# Patient Record
Sex: Female | Born: 1935 | Race: White | Hispanic: No | Smoking: Never smoker
Health system: Southern US, Community
[De-identification: ages and names within clinical notes are randomized; demographics above are authoritative.]

## PROBLEM LIST (undated history)

## (undated) DIAGNOSIS — G8929 Other chronic pain: Secondary | ICD-10-CM

## (undated) DIAGNOSIS — E785 Hyperlipidemia, unspecified: Secondary | ICD-10-CM

## (undated) DIAGNOSIS — F419 Anxiety disorder, unspecified: Secondary | ICD-10-CM

## (undated) DIAGNOSIS — M1990 Unspecified osteoarthritis, unspecified site: Secondary | ICD-10-CM

## (undated) DIAGNOSIS — K754 Autoimmune hepatitis: Secondary | ICD-10-CM

## (undated) DIAGNOSIS — F32A Depression, unspecified: Secondary | ICD-10-CM

## (undated) DIAGNOSIS — I5189 Other ill-defined heart diseases: Secondary | ICD-10-CM

## (undated) DIAGNOSIS — M545 Low back pain, unspecified: Secondary | ICD-10-CM

## (undated) DIAGNOSIS — R634 Abnormal weight loss: Secondary | ICD-10-CM

## (undated) DIAGNOSIS — M316 Other giant cell arteritis: Secondary | ICD-10-CM

## (undated) DIAGNOSIS — K5732 Diverticulitis of large intestine without perforation or abscess without bleeding: Secondary | ICD-10-CM

## (undated) DIAGNOSIS — I4819 Other persistent atrial fibrillation: Secondary | ICD-10-CM

## (undated) DIAGNOSIS — E039 Hypothyroidism, unspecified: Secondary | ICD-10-CM

## (undated) DIAGNOSIS — R519 Headache, unspecified: Secondary | ICD-10-CM

## (undated) DIAGNOSIS — E559 Vitamin D deficiency, unspecified: Secondary | ICD-10-CM

## (undated) DIAGNOSIS — K219 Gastro-esophageal reflux disease without esophagitis: Secondary | ICD-10-CM

## (undated) DIAGNOSIS — I10 Essential (primary) hypertension: Secondary | ICD-10-CM

## (undated) DIAGNOSIS — R51 Headache: Secondary | ICD-10-CM

## (undated) DIAGNOSIS — F329 Major depressive disorder, single episode, unspecified: Secondary | ICD-10-CM

## (undated) DIAGNOSIS — C50919 Malignant neoplasm of unspecified site of unspecified female breast: Secondary | ICD-10-CM

## (undated) DIAGNOSIS — I639 Cerebral infarction, unspecified: Secondary | ICD-10-CM

## (undated) DIAGNOSIS — M797 Fibromyalgia: Secondary | ICD-10-CM

## (undated) HISTORY — DX: Depression, unspecified: F32.A

## (undated) HISTORY — PX: CHOLECYSTECTOMY: SHX55

## (undated) HISTORY — DX: Essential (primary) hypertension: I10

## (undated) HISTORY — DX: Major depressive disorder, single episode, unspecified: F32.9

## (undated) HISTORY — DX: Gastro-esophageal reflux disease without esophagitis: K21.9

## (undated) HISTORY — PX: DILATION AND CURETTAGE OF UTERUS: SHX78

## (undated) HISTORY — PX: APPENDECTOMY: SHX54

## (undated) HISTORY — DX: Unspecified osteoarthritis, unspecified site: M19.90

## (undated) HISTORY — DX: Hypothyroidism, unspecified: E03.9

## (undated) HISTORY — DX: Vitamin D deficiency, unspecified: E55.9

## (undated) HISTORY — DX: Abnormal weight loss: R63.4

## (undated) HISTORY — PX: BREAST LUMPECTOMY: SHX2

## (undated) HISTORY — DX: Diverticulitis of large intestine without perforation or abscess without bleeding: K57.32

## (undated) HISTORY — DX: Other giant cell arteritis: M31.6

## (undated) HISTORY — DX: Hyperlipidemia, unspecified: E78.5

## (undated) HISTORY — DX: Cerebral infarction, unspecified: I63.9

## (undated) HISTORY — PX: TONSILLECTOMY: SUR1361

## (undated) HISTORY — PX: BREAST BIOPSY: SHX20

## (undated) HISTORY — DX: Autoimmune hepatitis: K75.4

---

## 1986-09-15 HISTORY — PX: ABDOMINAL HYSTERECTOMY: SHX81

## 1998-05-04 ENCOUNTER — Other Ambulatory Visit: Admission: RE | Admit: 1998-05-04 | Discharge: 1998-05-04 | Payer: Self-pay | Admitting: *Deleted

## 2000-08-20 ENCOUNTER — Other Ambulatory Visit: Admission: RE | Admit: 2000-08-20 | Discharge: 2000-08-20 | Payer: Self-pay | Admitting: Internal Medicine

## 2000-08-20 ENCOUNTER — Encounter (INDEPENDENT_AMBULATORY_CARE_PROVIDER_SITE_OTHER): Payer: Self-pay | Admitting: Specialist

## 2000-11-03 ENCOUNTER — Ambulatory Visit (HOSPITAL_COMMUNITY): Admission: RE | Admit: 2000-11-03 | Discharge: 2000-11-03 | Payer: Self-pay | Admitting: Cardiology

## 2001-01-12 ENCOUNTER — Other Ambulatory Visit: Admission: RE | Admit: 2001-01-12 | Discharge: 2001-01-12 | Payer: Self-pay | Admitting: Obstetrics and Gynecology

## 2001-09-15 DIAGNOSIS — M316 Other giant cell arteritis: Secondary | ICD-10-CM

## 2001-09-15 HISTORY — DX: Other giant cell arteritis: M31.6

## 2001-12-23 ENCOUNTER — Encounter: Admission: RE | Admit: 2001-12-23 | Discharge: 2001-12-23 | Payer: Self-pay | Admitting: Internal Medicine

## 2001-12-23 ENCOUNTER — Encounter: Payer: Self-pay | Admitting: Internal Medicine

## 2002-12-09 ENCOUNTER — Encounter: Payer: Self-pay | Admitting: *Deleted

## 2002-12-09 ENCOUNTER — Ambulatory Visit (HOSPITAL_COMMUNITY): Admission: RE | Admit: 2002-12-09 | Discharge: 2002-12-09 | Payer: Self-pay | Admitting: *Deleted

## 2002-12-20 ENCOUNTER — Encounter: Admission: RE | Admit: 2002-12-20 | Discharge: 2002-12-20 | Payer: Self-pay | Admitting: Rheumatology

## 2002-12-20 ENCOUNTER — Encounter: Payer: Self-pay | Admitting: Rheumatology

## 2002-12-26 ENCOUNTER — Ambulatory Visit (HOSPITAL_BASED_OUTPATIENT_CLINIC_OR_DEPARTMENT_OTHER): Admission: RE | Admit: 2002-12-26 | Discharge: 2002-12-26 | Payer: Self-pay | Admitting: Surgery

## 2002-12-26 ENCOUNTER — Encounter (INDEPENDENT_AMBULATORY_CARE_PROVIDER_SITE_OTHER): Payer: Self-pay | Admitting: Specialist

## 2004-07-30 ENCOUNTER — Ambulatory Visit: Payer: Self-pay | Admitting: Internal Medicine

## 2004-08-02 ENCOUNTER — Ambulatory Visit: Payer: Self-pay | Admitting: Endocrinology

## 2004-08-02 ENCOUNTER — Ambulatory Visit: Payer: Self-pay | Admitting: Internal Medicine

## 2004-10-23 ENCOUNTER — Ambulatory Visit: Payer: Self-pay | Admitting: Internal Medicine

## 2005-02-07 ENCOUNTER — Ambulatory Visit: Payer: Self-pay | Admitting: Internal Medicine

## 2005-03-05 ENCOUNTER — Ambulatory Visit: Payer: Self-pay | Admitting: Internal Medicine

## 2005-04-16 ENCOUNTER — Ambulatory Visit: Payer: Self-pay | Admitting: Internal Medicine

## 2005-05-28 ENCOUNTER — Ambulatory Visit: Payer: Self-pay | Admitting: Internal Medicine

## 2005-07-16 ENCOUNTER — Ambulatory Visit: Payer: Self-pay | Admitting: Internal Medicine

## 2005-07-22 ENCOUNTER — Ambulatory Visit: Payer: Self-pay | Admitting: Internal Medicine

## 2005-07-29 ENCOUNTER — Ambulatory Visit: Payer: Self-pay | Admitting: Internal Medicine

## 2005-07-30 ENCOUNTER — Ambulatory Visit: Payer: Self-pay | Admitting: Internal Medicine

## 2005-08-29 ENCOUNTER — Ambulatory Visit: Payer: Self-pay | Admitting: Internal Medicine

## 2005-09-03 ENCOUNTER — Ambulatory Visit: Payer: Self-pay | Admitting: Internal Medicine

## 2005-09-30 ENCOUNTER — Ambulatory Visit: Payer: Self-pay | Admitting: Internal Medicine

## 2005-10-01 ENCOUNTER — Ambulatory Visit: Payer: Self-pay | Admitting: Cardiology

## 2005-10-03 ENCOUNTER — Ambulatory Visit: Payer: Self-pay | Admitting: Internal Medicine

## 2005-11-24 ENCOUNTER — Encounter (INDEPENDENT_AMBULATORY_CARE_PROVIDER_SITE_OTHER): Payer: Self-pay | Admitting: *Deleted

## 2005-11-24 ENCOUNTER — Encounter (INDEPENDENT_AMBULATORY_CARE_PROVIDER_SITE_OTHER): Payer: Self-pay | Admitting: Diagnostic Radiology

## 2005-11-24 ENCOUNTER — Encounter: Admission: RE | Admit: 2005-11-24 | Discharge: 2005-11-24 | Payer: Self-pay | Admitting: General Surgery

## 2005-11-28 ENCOUNTER — Encounter: Admission: RE | Admit: 2005-11-28 | Discharge: 2005-11-28 | Payer: Self-pay | Admitting: General Surgery

## 2005-12-02 ENCOUNTER — Ambulatory Visit: Payer: Self-pay | Admitting: Internal Medicine

## 2005-12-03 ENCOUNTER — Encounter (INDEPENDENT_AMBULATORY_CARE_PROVIDER_SITE_OTHER): Payer: Self-pay | Admitting: Specialist

## 2005-12-03 ENCOUNTER — Ambulatory Visit (HOSPITAL_BASED_OUTPATIENT_CLINIC_OR_DEPARTMENT_OTHER): Admission: RE | Admit: 2005-12-03 | Discharge: 2005-12-03 | Payer: Self-pay | Admitting: General Surgery

## 2005-12-03 ENCOUNTER — Encounter: Admission: RE | Admit: 2005-12-03 | Discharge: 2005-12-03 | Payer: Self-pay | Admitting: General Surgery

## 2005-12-04 ENCOUNTER — Ambulatory Visit: Payer: Self-pay | Admitting: Oncology

## 2005-12-17 LAB — COMPREHENSIVE METABOLIC PANEL
Albumin: 4.5 g/dL (ref 3.5–5.2)
BUN: 11 mg/dL (ref 6–23)
CO2: 28 mEq/L (ref 19–32)
Calcium: 10 mg/dL (ref 8.4–10.5)
Chloride: 97 mEq/L (ref 96–112)
Creatinine, Ser: 0.7 mg/dL (ref 0.4–1.2)
Glucose, Bld: 89 mg/dL (ref 70–99)

## 2005-12-17 LAB — CBC WITH DIFFERENTIAL/PLATELET
BASO%: 0.4 % (ref 0.0–2.0)
Basophils Absolute: 0 10*3/uL (ref 0.0–0.1)
EOS%: 3 % (ref 0.0–7.0)
MCH: 30.5 pg (ref 26.0–34.0)
MCHC: 34.4 g/dL (ref 32.0–36.0)
MCV: 88.9 fL (ref 81.0–101.0)
MONO%: 8.7 % (ref 0.0–13.0)
RDW: 13.1 % (ref 11.3–14.5)
lymph#: 1.1 10*3/uL (ref 0.9–3.3)

## 2005-12-17 LAB — LACTATE DEHYDROGENASE: LDH: 135 U/L (ref 94–250)

## 2005-12-18 ENCOUNTER — Ambulatory Visit: Admission: RE | Admit: 2005-12-18 | Discharge: 2006-03-11 | Payer: Self-pay | Admitting: Radiation Oncology

## 2006-01-29 ENCOUNTER — Ambulatory Visit: Payer: Self-pay | Admitting: Internal Medicine

## 2006-02-03 ENCOUNTER — Ambulatory Visit: Payer: Self-pay | Admitting: Internal Medicine

## 2006-02-10 ENCOUNTER — Ambulatory Visit: Payer: Self-pay | Admitting: Oncology

## 2006-03-11 ENCOUNTER — Ambulatory Visit: Payer: Self-pay | Admitting: Internal Medicine

## 2006-04-07 ENCOUNTER — Ambulatory Visit: Payer: Self-pay | Admitting: Oncology

## 2006-06-09 ENCOUNTER — Ambulatory Visit: Payer: Self-pay | Admitting: Internal Medicine

## 2006-06-15 ENCOUNTER — Ambulatory Visit: Payer: Self-pay | Admitting: Cardiology

## 2006-06-23 ENCOUNTER — Ambulatory Visit: Payer: Self-pay | Admitting: Internal Medicine

## 2006-08-04 ENCOUNTER — Ambulatory Visit: Payer: Self-pay | Admitting: Internal Medicine

## 2006-08-05 ENCOUNTER — Ambulatory Visit: Payer: Self-pay | Admitting: Oncology

## 2006-08-10 LAB — CBC WITH DIFFERENTIAL/PLATELET
BASO%: 0.3 % (ref 0.0–2.0)
Basophils Absolute: 0 10*3/uL (ref 0.0–0.1)
EOS%: 3.9 % (ref 0.0–7.0)
LYMPH%: 8.9 % — ABNORMAL LOW (ref 14.0–48.0)
MCH: 30.6 pg (ref 26.0–34.0)
MCHC: 34.2 g/dL (ref 32.0–36.0)
MCV: 89.4 fL (ref 81.0–101.0)
MONO%: 11.7 % (ref 0.0–13.0)
NEUT#: 3.5 10*3/uL (ref 1.5–6.5)
NEUT%: 75.2 % (ref 39.6–76.8)
WBC: 4.6 10*3/uL (ref 3.9–10.0)

## 2006-08-13 ENCOUNTER — Encounter: Admission: RE | Admit: 2006-08-13 | Discharge: 2006-08-13 | Payer: Self-pay | Admitting: Internal Medicine

## 2006-09-18 ENCOUNTER — Ambulatory Visit: Payer: Self-pay | Admitting: Internal Medicine

## 2006-09-18 LAB — CONVERTED CEMR LAB
Basophils Relative: 0.9 % (ref 0.0–1.0)
Bilirubin, Direct: 0.2 mg/dL (ref 0.0–0.3)
Creatinine, Ser: 0.8 mg/dL (ref 0.4–1.2)
Eosinophil percent: 2.2 % (ref 0.0–5.0)
Glucose, Bld: 131 mg/dL — ABNORMAL HIGH (ref 70–99)
HCT: 37.3 % (ref 36.0–46.0)
Lymphocytes Relative: 9.1 % — ABNORMAL LOW (ref 12.0–46.0)
MCV: 91.5 fL (ref 78.0–100.0)
Monocytes Absolute: 0.5 10*3/uL (ref 0.2–0.7)
Neutro Abs: 4.2 10*3/uL (ref 1.4–7.7)
Platelets: 134 10*3/uL — ABNORMAL LOW (ref 150–400)
RBC: 4.07 M/uL (ref 3.87–5.11)
Rheumatoid Fact: 24.3 intl units/mL — ABNORMAL HIGH (ref 0.0–20.0)
Sed Rate: 41 mm/hr — ABNORMAL HIGH (ref 0–25)
Sodium: 137 meq/L (ref 135–145)
Total Bilirubin: 1.1 mg/dL (ref 0.3–1.2)
WBC: 5.3 10*3/uL (ref 4.5–10.5)

## 2006-11-03 ENCOUNTER — Ambulatory Visit: Payer: Self-pay | Admitting: Internal Medicine

## 2006-11-17 ENCOUNTER — Ambulatory Visit: Payer: Self-pay | Admitting: Internal Medicine

## 2007-02-11 ENCOUNTER — Ambulatory Visit: Payer: Self-pay | Admitting: Oncology

## 2007-02-15 LAB — CBC WITH DIFFERENTIAL/PLATELET
BASO%: 0.2 % (ref 0.0–2.0)
Eosinophils Absolute: 0.2 10*3/uL (ref 0.0–0.5)
MCHC: 36 g/dL (ref 32.0–36.0)
MONO#: 0.6 10*3/uL (ref 0.1–0.9)
MONO%: 10.8 % (ref 0.0–13.0)
NEUT#: 3.7 10*3/uL (ref 1.5–6.5)
RBC: 3.92 10*6/uL (ref 3.70–5.32)
RDW: 13.3 % (ref 11.3–14.5)
WBC: 5.2 10*3/uL (ref 3.9–10.0)

## 2007-02-15 LAB — COMPREHENSIVE METABOLIC PANEL
ALT: 21 U/L (ref 0–35)
Albumin: 4.2 g/dL (ref 3.5–5.2)
Alkaline Phosphatase: 60 U/L (ref 39–117)
Glucose, Bld: 70 mg/dL (ref 70–99)
Potassium: 4.3 mEq/L (ref 3.5–5.3)
Sodium: 138 mEq/L (ref 135–145)
Total Bilirubin: 0.7 mg/dL (ref 0.3–1.2)
Total Protein: 7.6 g/dL (ref 6.0–8.3)

## 2007-02-17 ENCOUNTER — Ambulatory Visit: Payer: Self-pay | Admitting: Internal Medicine

## 2007-04-07 ENCOUNTER — Ambulatory Visit: Payer: Self-pay | Admitting: Internal Medicine

## 2007-05-25 ENCOUNTER — Ambulatory Visit: Payer: Self-pay | Admitting: Internal Medicine

## 2007-05-25 LAB — CONVERTED CEMR LAB
ALT: 46 units/L — ABNORMAL HIGH (ref 0–35)
AST: 85 units/L — ABNORMAL HIGH (ref 0–37)
Albumin: 3.6 g/dL (ref 3.5–5.2)
Alkaline Phosphatase: 101 units/L (ref 39–117)
Bilirubin, Direct: 0.2 mg/dL (ref 0.0–0.3)
Eosinophils Relative: 2.7 % (ref 0.0–5.0)
HCT: 35 % — ABNORMAL LOW (ref 36.0–46.0)
Lymphocytes Relative: 8.9 % — ABNORMAL LOW (ref 12.0–46.0)
MCHC: 35.2 g/dL (ref 30.0–36.0)
MCV: 91.9 fL (ref 78.0–100.0)
Monocytes Absolute: 0.5 10*3/uL (ref 0.2–0.7)
Neutrophils Relative %: 77.3 % — ABNORMAL HIGH (ref 43.0–77.0)
Platelets: 140 10*3/uL — ABNORMAL LOW (ref 150–400)
RBC: 3.77 M/uL — ABNORMAL LOW (ref 3.87–5.11)
Sed Rate: 56 mm/hr — ABNORMAL HIGH (ref 0–25)
Total CHOL/HDL Ratio: 3.5
Triglycerides: 111 mg/dL (ref 0–149)
Vit D, 1,25-Dihydroxy: 18 — ABNORMAL LOW (ref 20–57)
WBC: 4.7 10*3/uL (ref 4.5–10.5)

## 2007-06-09 DIAGNOSIS — M353 Polymyalgia rheumatica: Secondary | ICD-10-CM | POA: Insufficient documentation

## 2007-06-09 DIAGNOSIS — E559 Vitamin D deficiency, unspecified: Secondary | ICD-10-CM | POA: Insufficient documentation

## 2007-07-19 ENCOUNTER — Ambulatory Visit: Payer: Self-pay | Admitting: Internal Medicine

## 2007-07-19 DIAGNOSIS — K5730 Diverticulosis of large intestine without perforation or abscess without bleeding: Secondary | ICD-10-CM | POA: Insufficient documentation

## 2007-07-19 DIAGNOSIS — I10 Essential (primary) hypertension: Secondary | ICD-10-CM | POA: Insufficient documentation

## 2007-07-19 DIAGNOSIS — E039 Hypothyroidism, unspecified: Secondary | ICD-10-CM | POA: Insufficient documentation

## 2007-07-19 DIAGNOSIS — M1990 Unspecified osteoarthritis, unspecified site: Secondary | ICD-10-CM | POA: Insufficient documentation

## 2007-07-19 DIAGNOSIS — Z853 Personal history of malignant neoplasm of breast: Secondary | ICD-10-CM

## 2007-07-19 DIAGNOSIS — J309 Allergic rhinitis, unspecified: Secondary | ICD-10-CM | POA: Insufficient documentation

## 2007-08-09 ENCOUNTER — Ambulatory Visit: Payer: Self-pay | Admitting: Oncology

## 2007-08-16 ENCOUNTER — Encounter: Payer: Self-pay | Admitting: Internal Medicine

## 2007-08-16 LAB — CBC WITH DIFFERENTIAL/PLATELET
Basophils Absolute: 0 10*3/uL (ref 0.0–0.1)
EOS%: 1.8 % (ref 0.0–7.0)
Eosinophils Absolute: 0.1 10*3/uL (ref 0.0–0.5)
LYMPH%: 8.4 % — ABNORMAL LOW (ref 14.0–48.0)
MCH: 31.3 pg (ref 26.0–34.0)
MCV: 88.7 fL (ref 81.0–101.0)
MONO%: 10.5 % (ref 0.0–13.0)
NEUT#: 5.8 10*3/uL (ref 1.5–6.5)
Platelets: 134 10*3/uL — ABNORMAL LOW (ref 145–400)
RBC: 4.26 10*6/uL (ref 3.70–5.32)
RDW: 13.8 % (ref 11.3–14.5)

## 2007-08-16 LAB — COMPREHENSIVE METABOLIC PANEL
AST: 23 U/L (ref 0–37)
Alkaline Phosphatase: 60 U/L (ref 39–117)
BUN: 19 mg/dL (ref 6–23)
Glucose, Bld: 87 mg/dL (ref 70–99)
Potassium: 4.5 mEq/L (ref 3.5–5.3)
Total Bilirubin: 0.7 mg/dL (ref 0.3–1.2)

## 2007-08-22 ENCOUNTER — Encounter: Payer: Self-pay | Admitting: Internal Medicine

## 2007-10-06 ENCOUNTER — Ambulatory Visit: Payer: Self-pay | Admitting: Internal Medicine

## 2007-10-06 LAB — CONVERTED CEMR LAB
Albumin: 3.6 g/dL (ref 3.5–5.2)
Alkaline Phosphatase: 48 units/L (ref 39–117)
CO2: 29 meq/L (ref 19–32)
Direct LDL: 125.9 mg/dL
GFR calc Af Amer: 79 mL/min
Glucose, Bld: 89 mg/dL (ref 70–99)
HDL: 66.7 mg/dL (ref >39.0)
Potassium: 4.2 meq/L (ref 3.5–5.1)
Sed Rate: 36 mm/hr — ABNORMAL HIGH (ref 0–25)
Sodium: 138 meq/L (ref 135–145)
TSH: 2.62 microintl units/mL (ref 0.35–5.50)
Total Bilirubin: 1.1 mg/dL (ref 0.3–1.2)
Total Protein: 7.5 g/dL (ref 6.0–8.3)
VLDL: 40 mg/dL (ref 0–40)

## 2007-10-15 ENCOUNTER — Ambulatory Visit: Payer: Self-pay | Admitting: Internal Medicine

## 2007-10-15 DIAGNOSIS — K219 Gastro-esophageal reflux disease without esophagitis: Secondary | ICD-10-CM | POA: Insufficient documentation

## 2007-12-10 ENCOUNTER — Encounter: Payer: Self-pay | Admitting: Internal Medicine

## 2008-02-04 ENCOUNTER — Ambulatory Visit: Payer: Self-pay | Admitting: Internal Medicine

## 2008-02-10 ENCOUNTER — Ambulatory Visit: Payer: Self-pay | Admitting: Internal Medicine

## 2008-02-10 ENCOUNTER — Ambulatory Visit: Payer: Self-pay | Admitting: Oncology

## 2008-02-22 ENCOUNTER — Encounter: Payer: Self-pay | Admitting: Internal Medicine

## 2008-02-22 LAB — CBC WITH DIFFERENTIAL/PLATELET
Basophils Absolute: 0 10*3/uL (ref 0.0–0.1)
Eosinophils Absolute: 0.1 10*3/uL (ref 0.0–0.5)
HCT: 36 % (ref 34.8–46.6)
HGB: 12.7 g/dL (ref 11.6–15.9)
MCV: 88.3 fL (ref 81.0–101.0)
MONO%: 9.7 % (ref 0.0–13.0)
NEUT#: 4 10*3/uL (ref 1.5–6.5)
NEUT%: 74.6 % (ref 39.6–76.8)
RDW: 12.7 % (ref 11.3–14.5)

## 2008-02-22 LAB — COMPREHENSIVE METABOLIC PANEL
Albumin: 4.1 g/dL (ref 3.5–5.2)
BUN: 10 mg/dL (ref 6–23)
Calcium: 9.7 mg/dL (ref 8.4–10.5)
Chloride: 100 mEq/L (ref 96–112)
Glucose, Bld: 88 mg/dL (ref 70–99)
Potassium: 4.8 mEq/L (ref 3.5–5.3)

## 2008-03-24 ENCOUNTER — Encounter: Payer: Self-pay | Admitting: Internal Medicine

## 2008-04-06 ENCOUNTER — Ambulatory Visit: Payer: Self-pay | Admitting: Internal Medicine

## 2008-04-06 DIAGNOSIS — R071 Chest pain on breathing: Secondary | ICD-10-CM

## 2008-04-14 ENCOUNTER — Telehealth: Payer: Self-pay | Admitting: Internal Medicine

## 2008-07-03 ENCOUNTER — Encounter: Admission: RE | Admit: 2008-07-03 | Discharge: 2008-07-03 | Payer: Self-pay | Admitting: General Surgery

## 2008-07-18 ENCOUNTER — Ambulatory Visit: Payer: Self-pay | Admitting: Internal Medicine

## 2008-08-11 ENCOUNTER — Ambulatory Visit: Payer: Self-pay | Admitting: Oncology

## 2008-08-15 ENCOUNTER — Encounter: Payer: Self-pay | Admitting: Internal Medicine

## 2008-08-15 LAB — CBC WITH DIFFERENTIAL/PLATELET
Basophils Absolute: 0 10*3/uL (ref 0.0–0.1)
EOS%: 1.9 % (ref 0.0–7.0)
HCT: 37.3 % (ref 34.8–46.6)
HGB: 13 g/dL (ref 11.6–15.9)
LYMPH%: 8.6 % — ABNORMAL LOW (ref 14.0–48.0)
MCH: 31.3 pg (ref 26.0–34.0)
MCHC: 34.8 g/dL (ref 32.0–36.0)
MCV: 89.9 fL (ref 81.0–101.0)
MONO%: 10.2 % (ref 0.0–13.0)
NEUT%: 79.2 % — ABNORMAL HIGH (ref 39.6–76.8)

## 2008-08-15 LAB — COMPREHENSIVE METABOLIC PANEL
AST: 31 U/L (ref 0–37)
Alkaline Phosphatase: 53 U/L (ref 39–117)
BUN: 10 mg/dL (ref 6–23)
Calcium: 9.6 mg/dL (ref 8.4–10.5)
Creatinine, Ser: 0.7 mg/dL (ref 0.40–1.20)
Total Bilirubin: 0.9 mg/dL (ref 0.3–1.2)

## 2008-09-06 ENCOUNTER — Encounter: Payer: Self-pay | Admitting: Internal Medicine

## 2008-09-19 ENCOUNTER — Encounter: Payer: Self-pay | Admitting: Internal Medicine

## 2008-10-03 ENCOUNTER — Telehealth: Payer: Self-pay | Admitting: Internal Medicine

## 2008-11-17 ENCOUNTER — Ambulatory Visit: Payer: Self-pay | Admitting: Internal Medicine

## 2009-01-04 ENCOUNTER — Encounter: Payer: Self-pay | Admitting: Internal Medicine

## 2009-01-30 ENCOUNTER — Encounter: Admission: RE | Admit: 2009-01-30 | Discharge: 2009-01-30 | Payer: Self-pay | Admitting: General Surgery

## 2009-03-12 ENCOUNTER — Encounter: Payer: Self-pay | Admitting: Internal Medicine

## 2009-03-16 ENCOUNTER — Ambulatory Visit: Payer: Self-pay | Admitting: Internal Medicine

## 2009-03-20 ENCOUNTER — Encounter: Payer: Self-pay | Admitting: Internal Medicine

## 2009-03-20 ENCOUNTER — Telehealth: Payer: Self-pay | Admitting: Internal Medicine

## 2009-05-15 ENCOUNTER — Encounter: Payer: Self-pay | Admitting: Internal Medicine

## 2009-06-26 ENCOUNTER — Encounter: Payer: Self-pay | Admitting: Internal Medicine

## 2009-07-27 ENCOUNTER — Ambulatory Visit: Payer: Self-pay | Admitting: Oncology

## 2009-07-31 ENCOUNTER — Encounter: Payer: Self-pay | Admitting: Internal Medicine

## 2009-07-31 LAB — CBC WITH DIFFERENTIAL/PLATELET
BASO%: 0.1 % (ref 0.0–2.0)
Basophils Absolute: 0 10*3/uL (ref 0.0–0.1)
EOS%: 3.8 % (ref 0.0–7.0)
HGB: 12.9 g/dL (ref 11.6–15.9)
MCH: 31.4 pg (ref 25.1–34.0)
MCHC: 34.5 g/dL (ref 31.5–36.0)
MCV: 90.9 fL (ref 79.5–101.0)
MONO%: 9.2 % (ref 0.0–14.0)
NEUT%: 76.4 % (ref 38.4–76.8)
RDW: 13.2 % (ref 11.2–14.5)
lymph#: 0.5 10*3/uL — ABNORMAL LOW (ref 0.9–3.3)

## 2009-07-31 LAB — COMPREHENSIVE METABOLIC PANEL
ALT: 32 U/L (ref 0–35)
AST: 49 U/L — ABNORMAL HIGH (ref 0–37)
Alkaline Phosphatase: 66 U/L (ref 39–117)
BUN: 10 mg/dL (ref 6–23)
Creatinine, Ser: 0.79 mg/dL (ref 0.40–1.20)
Potassium: 4.5 mEq/L (ref 3.5–5.3)

## 2009-08-15 ENCOUNTER — Ambulatory Visit: Payer: Self-pay | Admitting: Internal Medicine

## 2009-08-16 LAB — CONVERTED CEMR LAB
ALT: 39 units/L — ABNORMAL HIGH (ref 0–35)
Bilirubin, Direct: 0.1 mg/dL (ref 0.0–0.3)
Chloride: 105 meq/L (ref 96–112)
GFR calc non Af Amer: 74.34 mL/min (ref >60)
Glucose, Bld: 84 mg/dL (ref 70–99)
Potassium: 4.3 meq/L (ref 3.5–5.1)
TSH: 0.56 microintl units/mL (ref 0.35–5.50)
Vit D, 25-Hydroxy: 14 ng/mL — ABNORMAL LOW (ref 30–89)

## 2009-08-17 ENCOUNTER — Ambulatory Visit: Payer: Self-pay | Admitting: Internal Medicine

## 2009-08-17 DIAGNOSIS — Z8639 Personal history of other endocrine, nutritional and metabolic disease: Secondary | ICD-10-CM

## 2009-08-17 DIAGNOSIS — Z862 Personal history of diseases of the blood and blood-forming organs and certain disorders involving the immune mechanism: Secondary | ICD-10-CM | POA: Insufficient documentation

## 2009-09-03 ENCOUNTER — Encounter: Payer: Self-pay | Admitting: Endocrinology

## 2009-09-03 LAB — CYP450

## 2009-10-23 ENCOUNTER — Ambulatory Visit: Payer: Self-pay | Admitting: Internal Medicine

## 2009-10-23 DIAGNOSIS — R42 Dizziness and giddiness: Secondary | ICD-10-CM | POA: Insufficient documentation

## 2009-11-27 ENCOUNTER — Ambulatory Visit: Payer: Self-pay | Admitting: Internal Medicine

## 2009-11-28 LAB — CONVERTED CEMR LAB
ALT: 28 units/L (ref 0–35)
Albumin: 3.9 g/dL (ref 3.5–5.2)
Alkaline Phosphatase: 63 units/L (ref 39–117)
BUN: 10 mg/dL (ref 6–23)
Bilirubin, Direct: 0.2 mg/dL (ref 0.0–0.3)
Creatinine, Ser: 0.6 mg/dL (ref 0.4–1.2)
Total Bilirubin: 0.8 mg/dL (ref 0.3–1.2)
Total Protein: 8.2 g/dL (ref 6.0–8.3)

## 2009-11-30 ENCOUNTER — Ambulatory Visit: Payer: Self-pay | Admitting: Internal Medicine

## 2009-12-17 ENCOUNTER — Ambulatory Visit: Payer: Self-pay | Admitting: Oncology

## 2009-12-19 ENCOUNTER — Encounter: Payer: Self-pay | Admitting: Internal Medicine

## 2009-12-19 LAB — CBC WITH DIFFERENTIAL/PLATELET
Basophils Absolute: 0 10*3/uL (ref 0.0–0.1)
Eosinophils Absolute: 0.3 10*3/uL (ref 0.0–0.5)
HCT: 37.3 % (ref 34.8–46.6)
MCH: 31.7 pg (ref 25.1–34.0)
MONO#: 0.6 10*3/uL (ref 0.1–0.9)
NEUT%: 73.1 % (ref 38.4–76.8)
Platelets: 152 10*3/uL (ref 145–400)
RDW: 13.7 % (ref 11.2–14.5)
WBC: 6.2 10*3/uL (ref 3.9–10.3)
lymph#: 0.8 10*3/uL — ABNORMAL LOW (ref 0.9–3.3)

## 2009-12-19 LAB — COMPREHENSIVE METABOLIC PANEL
ALT: 29 U/L (ref 0–35)
Alkaline Phosphatase: 83 U/L (ref 39–117)
CO2: 23 mEq/L (ref 19–32)
Potassium: 4.1 mEq/L (ref 3.5–5.3)
Sodium: 136 mEq/L (ref 135–145)

## 2009-12-19 LAB — VITAMIN D 25 HYDROXY (VIT D DEFICIENCY, FRACTURES): Vit D, 25-Hydroxy: 14 ng/mL — ABNORMAL LOW (ref 30–89)

## 2010-01-17 ENCOUNTER — Encounter: Payer: Self-pay | Admitting: Internal Medicine

## 2010-02-13 DIAGNOSIS — R634 Abnormal weight loss: Secondary | ICD-10-CM

## 2010-02-13 HISTORY — DX: Abnormal weight loss: R63.4

## 2010-03-01 ENCOUNTER — Ambulatory Visit: Payer: Self-pay | Admitting: Internal Medicine

## 2010-03-04 LAB — CONVERTED CEMR LAB
AST: 95 units/L — ABNORMAL HIGH (ref 0–37)
Albumin: 3.7 g/dL (ref 3.5–5.2)
Alkaline Phosphatase: 85 units/L (ref 39–117)
Bilirubin Urine: NEGATIVE
Chloride: 109 meq/L (ref 96–112)
Cholesterol: 191 mg/dL (ref 0–200)
Creatinine, Ser: 0.7 mg/dL (ref 0.4–1.2)
Glucose, Bld: 82 mg/dL (ref 70–99)
HDL: 53.1 mg/dL (ref >39.00)
Hemoglobin, Urine: NEGATIVE
LDL Cholesterol: 112 mg/dL — ABNORMAL HIGH (ref 0–99)
Leukocytes, UA: NEGATIVE
Nitrite: NEGATIVE
Sodium: 141 meq/L (ref 135–145)
Specific Gravity, Urine: 1.025 (ref 1.000–1.030)
TSH: 0.62 microintl units/mL (ref 0.35–5.50)
Triglycerides: 131 mg/dL (ref 0.0–149.0)
Urobilinogen, UA: 1 (ref 0.0–1.0)
Vit D, 25-Hydroxy: 22 ng/mL — ABNORMAL LOW (ref 30–89)
pH: 5.5 (ref 5.0–8.0)

## 2010-03-06 ENCOUNTER — Ambulatory Visit: Payer: Self-pay | Admitting: Internal Medicine

## 2010-03-06 DIAGNOSIS — R5383 Other fatigue: Secondary | ICD-10-CM

## 2010-03-06 DIAGNOSIS — R634 Abnormal weight loss: Secondary | ICD-10-CM

## 2010-03-13 ENCOUNTER — Encounter: Admission: RE | Admit: 2010-03-13 | Discharge: 2010-03-13 | Payer: Self-pay | Admitting: Internal Medicine

## 2010-03-19 ENCOUNTER — Telehealth: Payer: Self-pay | Admitting: Internal Medicine

## 2010-04-03 ENCOUNTER — Ambulatory Visit: Payer: Self-pay | Admitting: Internal Medicine

## 2010-04-03 DIAGNOSIS — F341 Dysthymic disorder: Secondary | ICD-10-CM

## 2010-05-13 ENCOUNTER — Ambulatory Visit: Payer: Self-pay | Admitting: Internal Medicine

## 2010-05-13 LAB — CONVERTED CEMR LAB
Calcium: 9.4 mg/dL (ref 8.4–10.5)
Chloride: 101 meq/L (ref 96–112)
HDL: 64.4 mg/dL (ref >39.00)
Potassium: 4.8 meq/L (ref 3.5–5.1)
Sodium: 138 meq/L (ref 135–145)
Triglycerides: 97 mg/dL (ref 0.0–149.0)

## 2010-05-15 ENCOUNTER — Ambulatory Visit: Payer: Self-pay | Admitting: Internal Medicine

## 2010-07-11 ENCOUNTER — Ambulatory Visit: Payer: Self-pay | Admitting: Internal Medicine

## 2010-07-11 LAB — CONVERTED CEMR LAB
ALT: 20 units/L (ref 0–35)
AST: 37 units/L (ref 0–37)
Albumin: 3.8 g/dL (ref 3.5–5.2)
Alkaline Phosphatase: 67 units/L (ref 39–117)
CO2: 28 meq/L (ref 19–32)
Calcium: 8.8 mg/dL (ref 8.4–10.5)
Creatinine, Ser: 0.7 mg/dL (ref 0.4–1.2)
GFR calc non Af Amer: 86.51 mL/min (ref >60)
TSH: 2.01 microintl units/mL (ref 0.35–5.50)
Total Bilirubin: 0.8 mg/dL (ref 0.3–1.2)

## 2010-07-16 ENCOUNTER — Ambulatory Visit: Payer: Self-pay | Admitting: Internal Medicine

## 2010-07-16 DIAGNOSIS — E871 Hypo-osmolality and hyponatremia: Secondary | ICD-10-CM | POA: Insufficient documentation

## 2010-07-26 ENCOUNTER — Encounter: Payer: Self-pay | Admitting: Internal Medicine

## 2010-07-29 ENCOUNTER — Ambulatory Visit: Payer: Self-pay | Admitting: Oncology

## 2010-08-13 ENCOUNTER — Encounter: Payer: Self-pay | Admitting: Internal Medicine

## 2010-08-13 LAB — CBC WITH DIFFERENTIAL/PLATELET
Basophils Absolute: 0 10*3/uL (ref 0.0–0.1)
EOS%: 3.9 % (ref 0.0–7.0)
LYMPH%: 13.7 % — ABNORMAL LOW (ref 14.0–49.7)
MCH: 31.8 pg (ref 25.1–34.0)
MCV: 90.8 fL (ref 79.5–101.0)
MONO%: 7.6 % (ref 0.0–14.0)
NEUT#: 5.3 10*3/uL (ref 1.5–6.5)
NEUT%: 74.5 % (ref 38.4–76.8)
Platelets: 168 10*3/uL (ref 145–400)
RBC: 4.35 10*6/uL (ref 3.70–5.45)
RDW: 13.2 % (ref 11.2–14.5)
WBC: 7.1 10*3/uL (ref 3.9–10.3)
lymph#: 1 10*3/uL (ref 0.9–3.3)

## 2010-08-13 LAB — COMPREHENSIVE METABOLIC PANEL
Alkaline Phosphatase: 68 U/L (ref 39–117)
CO2: 27 mEq/L (ref 19–32)
Chloride: 100 mEq/L (ref 96–112)
Potassium: 4.3 mEq/L (ref 3.5–5.3)
Sodium: 139 mEq/L (ref 135–145)
Total Bilirubin: 0.9 mg/dL (ref 0.3–1.2)

## 2010-10-17 NOTE — Assessment & Plan Note (Signed)
Summary: FU ON SCAN/NWS   Vital Signs:  Patient profile:   77 year old female Height:      62 inches (157.48 cm) Weight:      116 pounds (52.73 kg) BMI:     21.29 O2 Sat:      96 % on Room air Temp:     97.0 degrees F (36.11 degrees C) oral Pulse rate:   96 / minute Pulse rhythm:   regular Resp:     16 per minute BP sitting:   120 / 70  (left arm) Cuff size:   regular  Vitals Entered By: Lanier Prude, CMA(AAMA) (April 03, 2010 3:25 PM)  O2 Flow:  Room air CC: f/u on Abd U/S Is Patient Diabetic? No Comments pt is not taking Antivert or Trazodone   CC:  f/u on Abd U/S.  History of Present Illness: F/u wt loss. C/o depression, hypothyroidism Pt states she is eating better  Current Medications (verified): 1)  Atenolol 25 Mg Tabs (Atenolol) .Marland Kitchen.. 1 Once Daily By Mouth 2)  Tamoxifen Citrate 20 Mg  Tabs (Tamoxifen Citrate) .... Qd 3)  Azathioprine 50 Mg Tabs (Azathioprine) .Marland Kitchen.. 1 By Mouth Once Daily 4)  Xanax 0.25 Mg Tabs (Alprazolam) .Marland Kitchen.. 1 To 2 At Bedtime As Needed 5)  Vitamin D3 1000 Unit  Tabs (Cholecalciferol) .... 2 Qd 6)  Zegerid 40-1100 Mg Caps (Omeprazole-Sodium Bicarbonate) .Marland Kitchen.. 1 By Mouth Daily 7)  Antivert 12.5 Mg Tabs (Meclizine Hcl) .Marland Kitchen.. 1 By Mouth Qid As Needed Vertigo 8)  Synthroid 75 Mcg Tabs (Levothyroxine Sodium) .Marland Kitchen.. 1 By Mouth Qd 9)  Trazodone Hcl 50 Mg Tabs (Trazodone Hcl) .Marland Kitchen.. 1 By Mouth Qhs 10)  Vitamin D (Ergocalciferol) 50000 Unit Caps (Ergocalciferol) .Marland Kitchen.. 1 By Mouth Q 1 Week X 6 Weeks Then Start Vit D 1000 International Units Qd  Allergies (verified): 1)  ! Codeine 2)  ! Penicillin 3)  ! Celebrex 4)  ! Lipitor 5)  ! Crestor  Past History:  Social History: Last updated: 07/19/2007 Retired Married Never Smoked  Past Medical History: Vit D def Allergic rhinitis Breast cancer, hx of R 08 Diverticulosis, colon Hypertension Hypothyroidism Osteoarthritis Autoimmune hepatitis Giant Cell arteritis 2003 Dr Titus Dubin GERD Depression H  zoster 2011 Wt loss 6/11 Vit D def  Review of Systems  The patient denies fever, dyspnea on exertion, prolonged cough, hemoptysis, and abdominal pain.    Physical Exam  General:  Chronically ill-appearing (mild) Mouth:  Oral mucosa and oropharynx without lesions or exudates.  Teeth in good repair. Neck:  No deformities, masses, or tenderness noted. Lungs:  Normal respiratory effort, chest expands symmetrically. Lungs are clear to auscultation, no crackles or wheezes. Heart:  Normal rate and regular rhythm. S1 and S2 normal without gallop, murmur, click, rub or other extra sounds. Abdomen:  Bowel sounds positive,abdomen soft and non-tender without masses, organomegaly or hernias noted. Msk:  No deformity or scoliosis noted of thoracic or lumbar spine.   Neurologic:  WNL   Impression & Recommendations:  Problem # 1:  WEIGHT LOSS (ICD-783.21) unclear etiol. Assessment Unchanged The labs were reviewed with the patient.  Orders: T-2 View CXR, Same Day (71020.5TC)  Problem # 2:  FATIGUE (ICD-780.79) Assessment: Unchanged  Problem # 3:  LIVER FUNCTION TESTS, ABNORMAL, HX OF (ICD-V12.2) Assessment: Unchanged The labs/US were reviewed with the patient.   Problem # 4:  DEPRESSION/ANXIETY (ICD-300.4) Assessment: Unchanged Restart Prozac  Complete Medication List: 1)  Atenolol 25 Mg Tabs (Atenolol) .Marland Kitchen.. 1 once daily by  mouth 2)  Tamoxifen Citrate 20 Mg Tabs (Tamoxifen citrate) .... Qd 3)  Azathioprine 50 Mg Tabs (Azathioprine) .Marland Kitchen.. 1 by mouth once daily 4)  Xanax 0.25 Mg Tabs (Alprazolam) .Marland Kitchen.. 1 to 2 at bedtime as needed 5)  Vitamin D3 1000 Unit Tabs (Cholecalciferol) .... 2 qd 6)  Zegerid 40-1100 Mg Caps (Omeprazole-sodium bicarbonate) .Marland Kitchen.. 1 by mouth daily 7)  Antivert 12.5 Mg Tabs (Meclizine hcl) .Marland Kitchen.. 1 by mouth qid as needed vertigo 8)  Synthroid 75 Mcg Tabs (Levothyroxine sodium) .Marland Kitchen.. 1 by mouth qd 9)  Vitamin D (ergocalciferol) 50000 Unit Caps (Ergocalciferol) .Marland Kitchen.. 1 by  mouth q 1 week x 6 weeks then start vit d 1000 international units qd 10)  Fluoxetine Hcl 10 Mg Tabs (Fluoxetine hcl) .Marland Kitchen.. 1 by mouth once daily  Patient Instructions: 1)  Please schedule a follow-up appointment in 1 month. 2)  BMP prior to visit, ICD-9: 3)  Lipid Panel prior to visit, ICD-9:995.20 Prescriptions: FLUOXETINE HCL 10 MG TABS (FLUOXETINE HCL) 1 by mouth once daily  #30 x 6   Entered and Authorized by:   Tresa Garter MD   Signed by:   Tresa Garter MD on 04/03/2010   Method used:   Electronically to        [id number]* (retail)       [address]       [place], [place]  [address]       Ph: [phone] or [phone]       Fax: [phone]   RxID:   [id number]

## 2010-10-17 NOTE — Letter (Signed)
Summary: [facility]  [facility]   Imported By: Sherian Rein 08/19/2010 09:28:33  _____________________________________________________________________  External Attachment:    Type:   Image     Comment:   External Document

## 2010-10-17 NOTE — Assessment & Plan Note (Signed)
Summary: 3 mos f/u #/cd   Vital Signs:  Patient profile:   77 year old female Weight:      127 pounds Temp:     97.1 degrees F oral Pulse rate:   67 / minute BP sitting:   126 / 82  (left arm)  Vitals Entered By: Tora Perches (November 30, 2009 1:06 PM) CC: f/u Is Patient Diabetic? No   CC:  f/u.  History of Present Illness: C/o being cold all the time. She stopped  Tamoxifen due to ? itching. It helped. The patient presents for a follow up of hypertension, GERD, hyperlipidemia, vasculitis.   Preventive Screening-Counseling & Management  Alcohol-Tobacco     Smoking Status: never  Current Medications (verified): 1)  Atenolol 25 Mg Tabs (Atenolol) .Marland Kitchen.. 1 Once Daily By Mouth 2)  Tamoxifen Citrate 20 Mg  Tabs (Tamoxifen Citrate) .... Qd 3)  Synthroid 88 Mcg Tabs (Levothyroxine Sodium) .Marland Kitchen.. 1 Once Daily 4)  Azathioprine 50 Mg Tabs (Azathioprine) .Marland Kitchen.. 1 By Mouth Once Daily 5)  Xanax 0.25 Mg Tabs (Alprazolam) .Marland Kitchen.. 1 To 2 At Bedtime As Needed 6)  Vitamin D3 1000 Unit  Tabs (Cholecalciferol) .... 2 Qd 7)  Zegerid 40-1100 Mg Caps (Omeprazole-Sodium Bicarbonate) .Marland Kitchen.. 1 By Mouth Daily 8)  Vitamin D (Ergocalciferol) 50000 Unit Caps (Ergocalciferol) .Marland Kitchen.. 1 By Mouth Q 1 Week X 6 Weeks Then Start Vit D 1000 International Units Qd 9)  Antivert 12.5 Mg Tabs (Meclizine Hcl) .Marland Kitchen.. 1 By Mouth Qid As Needed Vertigo  Allergies: 1)  ! Codeine 2)  ! Penicillin 3)  ! Celebrex 4)  ! Lipitor 5)  ! Crestor  Past History:  Past Surgical History: Last updated: 07/19/2007 Appendectomy Cholecystectomy Hysterectomy Lumpectomy R 08  Social History: Last updated: 07/19/2007 Retired Married Never Smoked  Past Medical History: Reviewed history from 10/23/2009 and no changes required. Vit D def Allergic rhinitis Breast cancer, hx of R 08 Diverticulosis, colon Hypertension Hypothyroidism Osteoarthritis Autoimmune hepatitis Giant Cell arteritis 2003 Dr Titus Dubin GERD Depression H zoster  2011  Review of Systems  The patient denies fever, abdominal pain, and melena.    Physical Exam  General:  Well-developed,well-nourished,in no acute distress; alert,appropriate and cooperative throughout examination Mouth:  Oral mucosa and oropharynx without lesions or exudates.  Teeth in good repair. Neck:  No deformities, masses, or tenderness noted. Lungs:  Normal respiratory effort, chest expands symmetrically. Lungs are clear to auscultation, no crackles or wheezes. Abdomen:  Bowel sounds positive,abdomen soft and non-tender without masses, organomegaly or hernias noted. Msk:  No deformity or scoliosis noted of thoracic or lumbar spine.   Neurologic:  WNL Skin:  vein teleangioectesias over LEs Psych:  Oriented X3, normally interactive, good eye contact, not agitated, not suicidal, and slightly anxious.     Impression & Recommendations:  Problem # 1:  VITAMIN D DEFICIENCY (ICD-268.9) Assessment Improved  Risks of noncompliance with treatment discussed. Compliance encouraged.  Problem # 2:  HYPOTHYROIDISM (ICD-244.9) Assessment: Comment Only  The following medications were removed from the medication list:    Synthroid 88 Mcg Tabs (Levothyroxine sodium) .Marland Kitchen... 1 once daily Her updated medication list for this problem includes:    Synthroid 75 Mcg Tabs (Levothyroxine sodium) .Marland Kitchen... 1 by mouth qd  Labs Reviewed: TSH: 0.30 (11/27/2009)    Chol: 241 (10/06/2007)   HDL: 66.7 (10/06/2007)   LDL: DEL (10/06/2007)   TG: 202 (10/06/2007)  Problem # 3:  POLYMYALGIA RHEUMATICA (ICD-725) Assessment: Unchanged On prescription therapy   Problem # 4:  HYPERTENSION (ICD-401.9) Assessment: Comment Only  Her updated medication list for this problem includes:    Atenolol 25 Mg Tabs (Atenolol) .Marland Kitchen... 1 once daily by mouth  Problem # 5:  GERD (ICD-530.81) Assessment: Unchanged  Her updated medication list for this problem includes:    Zegerid 40-1100 Mg Caps (Omeprazole-sodium  bicarbonate) .Marland Kitchen... 1 by mouth daily  Problem # 6:  BREAST CANCER; OTHER (ICD-174.8) Assessment: Comment Only  Risks of noncompliance with Tamoxifen  treatment discussed. Compliance encouraged. See Dr Darnelle Catalan to discuss Rx.  Orders: Oncology Referral (Oncology)  Complete Medication List: 1)  Atenolol 25 Mg Tabs (Atenolol) .Marland Kitchen.. 1 once daily by mouth 2)  Tamoxifen Citrate 20 Mg Tabs (Tamoxifen citrate) .... Qd 3)  Azathioprine 50 Mg Tabs (Azathioprine) .Marland Kitchen.. 1 by mouth once daily 4)  Xanax 0.25 Mg Tabs (Alprazolam) .Marland Kitchen.. 1 to 2 at bedtime as needed 5)  Vitamin D3 1000 Unit Tabs (Cholecalciferol) .... 2 qd 6)  Zegerid 40-1100 Mg Caps (Omeprazole-sodium bicarbonate) .Marland Kitchen.. 1 by mouth daily 7)  Antivert 12.5 Mg Tabs (Meclizine hcl) .Marland Kitchen.. 1 by mouth qid as needed vertigo 8)  Synthroid 75 Mcg Tabs (Levothyroxine sodium) .Marland Kitchen.. 1 by mouth qd  Patient Instructions: 1)  Please schedule a follow-up appointment in 3 months well. 2)  BMP prior to visit, ICD-9: 3)  TSH prior to visit, ICD-9: 4)  Vit B12 782.0 5)  Vit D 268.9 6)  BMP prior to visit, ICD-9: 7)  Hepatic Panel prior to visit, ICD-9: 8)  Lipid Panel prior to visit, ICD-9: v70.0 9)  Urine-dip prior to visit, ICD-9: Prescriptions: SYNTHROID 75 MCG TABS (LEVOTHYROXINE SODIUM) 1 by mouth qd  #30 x 12   Entered and Authorized by:   Tresa Garter MD   Signed by:   Tresa Garter MD on 11/30/2009   Method used:   Print then Give to Patient   RxID:   [id number]

## 2010-10-17 NOTE — Letter (Signed)
Summary: [facility]  [facility]   Imported By: Lester [place] 08/09/2010 11:38:22  _____________________________________________________________________  External Attachment:    Type:   Image     Comment:   External Document

## 2010-10-17 NOTE — Assessment & Plan Note (Signed)
Summary: dizzy, has eye surgery tomorrow/ SD   Vital Signs:  Patient profile:   77 year old female Weight:      130 pounds O2 Sat:      96 % Temp:     97.9 degrees F oral Pulse rate:   61 / minute BP supine:   174 / 140  (left arm) BP sitting:   166 / 96  (left arm) BP standing:   162 / 110  (left arm)  Vitals Entered By: Tora Perches (October 23, 2009 4:25 PM) CC: dizziness Is Patient Diabetic? No Comments bp rechecked   182/140 left arm lying down/vg   CC:  dizziness.  History of Present Illness: C/o L chest shingles - on Valtrex C/o dizziness since Sun off and on. It started when she turned her head in bed, was nauseated  Preventive Screening-Counseling & Management  Alcohol-Tobacco     Smoking Status: never  Current Medications (verified): 1)  Atenolol 25 Mg Tabs (Atenolol) .Marland Kitchen.. 1 Once Daily By Mouth 2)  Tamoxifen Citrate 20 Mg  Tabs (Tamoxifen Citrate) .... Qd 3)  Synthroid 88 Mcg Tabs (Levothyroxine Sodium) .Marland Kitchen.. 1 Once Daily 4)  Azathioprine 50 Mg Tabs (Azathioprine) .Marland Kitchen.. 1 By Mouth Once Daily 5)  Xanax 0.25 Mg Tabs (Alprazolam) .Marland Kitchen.. 1 To 2 At Bedtime As Needed 6)  Vitamin D3 1000 Unit  Tabs (Cholecalciferol) .... 2 Qd 7)  Zegerid 40-1100 Mg Caps (Omeprazole-Sodium Bicarbonate) .Marland Kitchen.. 1 By Mouth Daily 8)  Vitamin D (Ergocalciferol) 50000 Unit Caps (Ergocalciferol) .Marland Kitchen.. 1 By Mouth Q 1 Week X 6 Weeks Then Start Vit D 1000 International Units Qd  Allergies: 1)  ! Codeine 2)  ! Penicillin 3)  ! Celebrex 4)  ! Lipitor 5)  ! Crestor  Past History:  Past Surgical History: Last updated: 07/19/2007 Appendectomy Cholecystectomy Hysterectomy Lumpectomy R 08  Social History: Last updated: 07/19/2007 Retired Married Never Smoked  Past Medical History: Vit D def Allergic rhinitis Breast cancer, hx of R 08 Diverticulosis, colon Hypertension Hypothyroidism Osteoarthritis Autoimmune hepatitis Giant Cell arteritis 2003 Dr Titus Dubin GERD Depression H  zoster 2011  Review of Systems  The patient denies fever, chest pain, syncope, dyspnea on exertion, and abdominal pain.    Physical Exam  General:  Well-developed,well-nourished,in no acute distress; alert,appropriate and cooperative throughout examination Ears:  External ear exam shows no significant lesions or deformities.  Otoscopic examination reveals clear canals, tympanic membranes are intact bilaterally without bulging, retraction, inflammation or discharge. Hearing is grossly normal bilaterally. Mouth:  Oral mucosa and oropharynx without lesions or exudates.  Teeth in good repair. Neck:  No deformities, masses, or tenderness noted. Lungs:  Normal respiratory effort, chest expands symmetrically. Lungs are clear to auscultation, no crackles or wheezes. Heart:  Normal rate and regular rhythm. S1 and S2 normal without gallop, murmur, click, rub or other extra sounds. Abdomen:  Bowel sounds positive,abdomen soft and non-tender without masses, organomegaly or hernias noted. Msk:  No deformity or scoliosis noted of thoracic or lumbar spine.   Extremities:  No clubbing, cyanosis, edema, or deformity noted with normal full range of motion of all joints.   Neurologic:  L H-P (+)   Impression & Recommendations:  Problem # 1:  VERTIGO (ICD-780.4) Assessment New The office visit took longer than 20 min with patient councelling for more than 50% of the 20 min   Francee Piccolo - Daroff exercise was given to the patient  Orders: EKG w/ Interpretation (93000)  Her updated medication list for  this problem includes:    Antivert 12.5 Mg Tabs (Meclizine hcl) .Marland Kitchen... 1 by mouth qid as needed vertigo  Complete Medication List: 1)  Atenolol 25 Mg Tabs (Atenolol) .Marland Kitchen.. 1 once daily by mouth 2)  Tamoxifen Citrate 20 Mg Tabs (Tamoxifen citrate) .... Qd 3)  Synthroid 88 Mcg Tabs (Levothyroxine sodium) .Marland Kitchen.. 1 once daily 4)  Azathioprine 50 Mg Tabs (Azathioprine) .Marland Kitchen.. 1 by mouth once daily 5)  Xanax 0.25 Mg Tabs  (Alprazolam) .Marland Kitchen.. 1 to 2 at bedtime as needed 6)  Vitamin D3 1000 Unit Tabs (Cholecalciferol) .... 2 qd 7)  Zegerid 40-1100 Mg Caps (Omeprazole-sodium bicarbonate) .Marland Kitchen.. 1 by mouth daily 8)  Vitamin D (ergocalciferol) 50000 Unit Caps (Ergocalciferol) .Marland Kitchen.. 1 by mouth q 1 week x 6 weeks then start vit d 1000 international units qd 9)  Antivert 12.5 Mg Tabs (Meclizine hcl) .Marland Kitchen.. 1 by mouth qid as needed vertigo  Patient Instructions: 1)  Francee Piccolo - Daroff exercise 2)  Call if you are not better in a reasonable amount of time or if worse. Go to ER if feeling really bad!  Prescriptions: ANTIVERT 12.5 MG TABS (MECLIZINE HCL) 1 by mouth qid as needed vertigo  #60 x 1   Entered and Authorized by:   Tresa Garter MD   Signed by:   Tresa Garter MD on 10/23/2009   Method used:   Electronically to        [id number]* (retail)       [address]       [place], [place]  [address]       Ph: [phone] or [phone]       Fax: [phone]   RxID:   [id number]

## 2010-10-17 NOTE — Assessment & Plan Note (Signed)
Summary: 1 MO ROV /NWS  #   Vital Signs:  Patient profile:   77 year old female Height:      62 inches (157.48 cm) Weight:      116.13 pounds (52.79 kg) BMI:     21.32 O2 Sat:      98 % on Room air Temp:     96.7 degrees F (35.94 degrees C) oral Pulse rate:   66 / minute BP sitting:   120 / 80  (left arm) Cuff size:   regular  Vitals Entered By: Lucious Groves CMA (May 15, 2010 1:33 PM)  O2 Flow:  Room air CC: Follow-up visit./kb Is Patient Diabetic? No Pain Assessment Patient in pain? no      Comments Patient states that she is out of Azathioprine and is not currently taking Antivert. Lucious Groves CMA  May 15, 2010 1:34 PM    CC:  Follow-up visit./kb.  History of Present Illness: F/u wt loss - better F/u HTN, GERD, breast cancer  Current Medications (verified): 1)  Atenolol 25 Mg Tabs (Atenolol) .Marland Kitchen.. 1 Once Daily By Mouth 2)  Tamoxifen Citrate 20 Mg  Tabs (Tamoxifen Citrate) .... Qd 3)  Azathioprine 50 Mg Tabs (Azathioprine) .Marland Kitchen.. 1 By Mouth Once Daily 4)  Xanax 0.25 Mg Tabs (Alprazolam) .Marland Kitchen.. 1 To 2 At Bedtime As Needed 5)  Vitamin D3 1000 Unit  Tabs (Cholecalciferol) .... 2 Qd 6)  Zegerid 40-1100 Mg Caps (Omeprazole-Sodium Bicarbonate) .Marland Kitchen.. 1 By Mouth Daily 7)  Antivert 12.5 Mg Tabs (Meclizine Hcl) .Marland Kitchen.. 1 By Mouth Qid As Needed Vertigo 8)  Synthroid 75 Mcg Tabs (Levothyroxine Sodium) .Marland Kitchen.. 1 By Mouth Qd 9)  Vitamin D (Ergocalciferol) 50000 Unit Caps (Ergocalciferol) .Marland Kitchen.. 1 By Mouth Q 1 Week X 6 Weeks Then Start Vit D 1000 International Units Qd 10)  Fluoxetine Hcl 10 Mg Tabs (Fluoxetine Hcl) .Marland Kitchen.. 1 By Mouth Once Daily  Allergies (verified): 1)  ! Codeine 2)  ! Penicillin 3)  ! Celebrex 4)  ! Lipitor 5)  ! Crestor  Past History:  Past Medical History: Last updated: 04/03/2010 Vit D def Allergic rhinitis Breast cancer, hx of R 08 Diverticulosis, colon Hypertension Hypothyroidism Osteoarthritis Autoimmune hepatitis Giant Cell arteritis 2003 Dr  Titus Dubin GERD Depression H zoster 2011 Wt loss 6/11 Vit D def  Past Surgical History: Last updated: 07/19/2007 Appendectomy Cholecystectomy Hysterectomy Lumpectomy R 08  Social History: Last updated: 07/19/2007 Retired Married Never Smoked  Review of Systems  The patient denies anorexia, weight loss, dyspnea on exertion, peripheral edema, abdominal pain, and depression.    Physical Exam  General:  Not ill-appearing  Mouth:  Oral mucosa and oropharynx without lesions or exudates.  Teeth in good repair. Neck:  No deformities, masses, or tenderness noted. Lungs:  Normal respiratory effort, chest expands symmetrically. Lungs are clear to auscultation, no crackles or wheezes. Heart:  Normal rate and regular rhythm. S1 and S2 normal without gallop, murmur, click, rub or other extra sounds. Abdomen:  Bowel sounds positive,abdomen soft and non-tender without masses, organomegaly or hernias noted. Msk:  No deformity or scoliosis noted of thoracic or lumbar spine.   Neurologic:  WNL Skin:  vein teleangioectesias over LEs Psych:  Oriented X3, normally interactive, good eye contact, not agitated, not suicidal, and not anxious.     Impression & Recommendations:  Problem # 1:  WEIGHT LOSS (ICD-783.21) Assessment Improved The labs were reviewed with the patient.   Problem # 2:  FATIGUE (ICD-780.79) Assessment: Improved  Problem #  3:  DEPRESSION (ICD-311) Assessment: Improved  Her updated medication list for this problem includes:    Xanax 0.25 Mg Tabs (Alprazolam) .Marland Kitchen... 1 to 2 at bedtime as needed    Fluoxetine Hcl 10 Mg Tabs (Fluoxetine hcl) .Marland Kitchen... 1 by mouth once daily  Problem # 4:  LIVER FUNCTION TESTS, ABNORMAL, HX OF (ICD-V12.2) Assessment: Improved  Complete Medication List: 1)  Atenolol 25 Mg Tabs (Atenolol) .Marland Kitchen.. 1 once daily by mouth 2)  Tamoxifen Citrate 20 Mg Tabs (Tamoxifen citrate) .... Qd 3)  Azathioprine 50 Mg Tabs (Azathioprine) .Marland Kitchen.. 1 by mouth once  daily 4)  Xanax 0.25 Mg Tabs (Alprazolam) .Marland Kitchen.. 1 to 2 at bedtime as needed 5)  Vitamin D3 1000 Unit Tabs (Cholecalciferol) .... 2 qd 6)  Zegerid 40-1100 Mg Caps (Omeprazole-sodium bicarbonate) .Marland Kitchen.. 1 by mouth daily 7)  Antivert 12.5 Mg Tabs (Meclizine hcl) .Marland Kitchen.. 1 by mouth qid as needed vertigo 8)  Synthroid 75 Mcg Tabs (Levothyroxine sodium) .Marland Kitchen.. 1 by mouth qd 9)  Vitamin D (ergocalciferol) 50000 Unit Caps (Ergocalciferol) .Marland Kitchen.. 1 by mouth q 1 week x 6 weeks then start vit d 1000 international units qd 10)  Fluoxetine Hcl 10 Mg Tabs (Fluoxetine hcl) .Marland Kitchen.. 1 by mouth once daily  Other Orders: Flu Vaccine 31yrs + (04540) Administration Flu vaccine - MCR (J8119)  Patient Instructions: 1)  Please schedule a follow-up appointment in 2 months. 2)  BMP prior to visit, ICD-9: 3)  Hepatic Panel prior to visit, ICD-9:995.20 4)  TSH prior to visit, ICD-9: Prescriptions: AZATHIOPRINE 50 MG TABS (AZATHIOPRINE) 1 by mouth once daily  #90 x 3   Entered and Authorized by:   Tresa Garter MD   Signed by:   Tresa Garter MD on 05/15/2010   Method used:   Print then Give to Patient   RxID:   [id number]   Flu Vaccine Consent Questions     Do you have a history of severe allergic reactions to this vaccine? no    Any prior history of allergic reactions to egg and/or gelatin? no    Do you have a sensitivity to the preservative Thimersol? no    Do you have a past history of Guillan-Barre Syndrome? no    Do you currently have an acute febrile illness? no    Have you ever had a severe reaction to latex? no    Vaccine information given and explained to patient? yes    Are you currently pregnant? no    Lot Number:[id number]   Exp Date:03/15/2011   Site Given  Left Deltoid IMent-CCC]       .lbmedflu ............. Lamar Sprinkles, CMA  May 15, 2010 1:58 PM

## 2010-10-17 NOTE — Letter (Signed)
Summary: [facility]  [facility]   Imported By: Lester [place] 01/30/2010 08:28:53  _____________________________________________________________________  External Attachment:    Type:   Image     Comment:   External Document

## 2010-10-17 NOTE — Letter (Signed)
Summary: [facility]  [facility]   Imported By: Sherian Rein 09/21/2009 07:15:45  _____________________________________________________________________  External Attachment:    Type:   Image     Comment:   External Document

## 2010-10-17 NOTE — Progress Notes (Signed)
Summary: Rf Alprazolam  Phone Note Refill Request Message from:  Pharmacy  Refills Requested: Medication #1:  XANAX 0.25 MG TABS 1 to 2 at bedtime as needed   Dosage confirmed as above?Dosage Confirmed   Supply Requested: 1 month  Method Requested: Telephone to Pharmacy Next Appointment Scheduled: 04-03-10 Initial call taken by: Lanier Prude, [facility]),  March 19, 2010 10:53 AM  Follow-up for Phone Call        ok x 6 ref Follow-up by: Tresa Garter MD,  March 20, 2010 8:13 AM    Prescriptions: Prudy Feeler 0.25 MG TABS (ALPRAZOLAM) 1 to 2 at bedtime as needed  #60 x 6   Entered by:   Lucious Groves   Authorized by:   Tresa Garter MD   Signed by:   Lucious Groves on 03/20/2010   Method used:   Telephoned to ...       [facility]  [address] [id number]* (retail)       [address]       [place], [place]  [address]       Ph: [phone] or [phone]       Fax: [phone]   RxID:   [id number]

## 2010-10-17 NOTE — Assessment & Plan Note (Signed)
Summary: 2 MO ROV /NWS  #   Vital Signs:  Patient profile:   77 year old female Height:      62 inches Weight:      114 pounds BMI:     20.93 Temp:     97.7 degrees F oral Pulse rate:   64 / minute Pulse rhythm:   regular Resp:     16 per minute BP sitting:   150 / 90  (left arm) Cuff size:   regular  Vitals Entered By: Lanier Prude, CMA(AAMA) (July 16, 2010 1:11 PM) CC: 3 mo f/u Is Patient Diabetic? No   CC:  3 mo f/u.  History of Present Illness: The patient presents for a follow up of hypertension, wt loss, anxiety   Current Medications (verified): 1)  Atenolol 25 Mg Tabs (Atenolol) .Marland Kitchen.. 1 Once Daily By Mouth 2)  Tamoxifen Citrate 20 Mg  Tabs (Tamoxifen Citrate) .... Qd 3)  Azathioprine 50 Mg Tabs (Azathioprine) .Marland Kitchen.. 1 By Mouth Once Daily 4)  Xanax 0.25 Mg Tabs (Alprazolam) .Marland Kitchen.. 1 To 2 At Bedtime As Needed 5)  Vitamin D3 1000 Unit  Tabs (Cholecalciferol) .... 2 Qd 6)  Zegerid 40-1100 Mg Caps (Omeprazole-Sodium Bicarbonate) .Marland Kitchen.. 1 By Mouth Daily 7)  Antivert 12.5 Mg Tabs (Meclizine Hcl) .Marland Kitchen.. 1 By Mouth Qid As Needed Vertigo 8)  Synthroid 75 Mcg Tabs (Levothyroxine Sodium) .Marland Kitchen.. 1 By Mouth Qd 9)  Vitamin D (Ergocalciferol) 50000 Unit Caps (Ergocalciferol) .Marland Kitchen.. 1 By Mouth Q 1 Week X 6 Weeks Then Start Vit D 1000 International Units Qd 10)  Fluoxetine Hcl 10 Mg Tabs (Fluoxetine Hcl) .Marland Kitchen.. 1 By Mouth Once Daily 11)  Vitamin D3 1000 Unit Tabs (Cholecalciferol) .Marland Kitchen.. 1 By Mouth Once Daily  Allergies (verified): 1)  ! Codeine 2)  ! Penicillin 3)  ! Celebrex 4)  ! Lipitor 5)  ! Crestor  Past History:  Past Medical History: Last updated: 04/03/2010 Vit D def Allergic rhinitis Breast cancer, hx of R 08 Diverticulosis, colon Hypertension Hypothyroidism Osteoarthritis Autoimmune hepatitis Giant Cell arteritis 2003 Dr Titus Dubin GERD Depression H zoster 2011 Wt loss 6/11 Vit D def  Social History: Last updated: 07/19/2007 Retired Married Never  Smoked  Review of Systems       The patient complains of weight loss.  The patient denies fever, chest pain, dyspnea on exertion, abdominal pain, and hematochezia.         anxiety  Physical Exam  General:  Not ill-appearing and thin Ears:  External ear exam shows no significant lesions or deformities.  Otoscopic examination reveals clear canals, tympanic membranes are intact bilaterally without bulging, retraction, inflammation or discharge. Hearing is grossly normal bilaterally. Mouth:  Oral mucosa and oropharynx without lesions or exudates.  Teeth in good repair. Neck:  No deformities, masses, or tenderness noted. Lungs:  Normal respiratory effort, chest expands symmetrically. Lungs are clear to auscultation, no crackles or wheezes. Heart:  Normal rate and regular rhythm. S1 and S2 normal without gallop, murmur, click, rub or other extra sounds. Abdomen:  Bowel sounds positive,abdomen soft and non-tender without masses, organomegaly or hernias noted. Msk:  No deformity or scoliosis noted of thoracic or lumbar spine.   Extremities:  No clubbing, cyanosis, edema, or deformity noted with normal full range of motion of all joints.   Neurologic:  WNL Skin:  vein teleangioectesias over LEs Psych:  Oriented X3, normally interactive, good eye contact, not agitated, not suicidal, and not anxious.     Impression & Recommendations:  Problem # 1:  DEPRESSION/ANXIETY (ICD-300.4) Assessment Improved On the regimen of medicine(s) reflected in the chart    Problem # 2:  WEIGHT LOSS (ICD-783.21) Assessment: Unchanged  Problem # 3:  ESSENTIAL HYPERTENSION, BENIGN (ICD-401.1) Assessment: Unchanged  Her updated medication list for this problem includes:    Atenolol 25 Mg Tabs (Atenolol) .Marland Kitchen... 1 once daily by mouth  BP today: 150/90 Prior BP: 120/80 (05/15/2010)  Labs Reviewed: K+: 4.2 (07/11/2010) Creat: : 0.7 (07/11/2010)   Chol: 198 (05/13/2010)   HDL: 64.40 (05/13/2010)   LDL: 114  (05/13/2010)   TG: 97.0 (05/13/2010)  Problem # 4:  BREAST CANCER, HX OF (ICD-V10.3) Assessment: Unchanged On the regimen of medicine(s) reflected in the chart    Problem # 5:  HYPONATREMIA (ICD-276.1) Assessment: New The labs were reviewed with the patient. Limit water intake  Complete Medication List: 1)  Atenolol 25 Mg Tabs (Atenolol) .Marland Kitchen.. 1 once daily by mouth 2)  Tamoxifen Citrate 20 Mg Tabs (Tamoxifen citrate) .... Qd 3)  Azathioprine 50 Mg Tabs (Azathioprine) .Marland Kitchen.. 1 by mouth once daily 4)  Xanax 0.25 Mg Tabs (Alprazolam) .Marland Kitchen.. 1 to 2 at bedtime as needed 5)  Vitamin D3 1000 Unit Tabs (Cholecalciferol) .... 2 qd 6)  Zegerid 40-1100 Mg Caps (Omeprazole-sodium bicarbonate) .Marland Kitchen.. 1 by mouth daily 7)  Antivert 12.5 Mg Tabs (Meclizine hcl) .Marland Kitchen.. 1 by mouth qid as needed vertigo 8)  Synthroid 75 Mcg Tabs (Levothyroxine sodium) .Marland Kitchen.. 1 by mouth qd 9)  Vitamin D (ergocalciferol) 50000 Unit Caps (Ergocalciferol) .Marland Kitchen.. 1 by mouth q 1 week x 6 weeks then start vit d 1000 international units qd 10)  Fluoxetine Hcl 10 Mg Tabs (Fluoxetine hcl) .Marland Kitchen.. 1 by mouth once daily 11)  Vitamin D3 1000 Unit Tabs (Cholecalciferol) .Marland Kitchen.. 1 by mouth once daily  Patient Instructions: 1)  Please schedule a follow-up appointment in 3-4 months well w/labs   Orders Added: 1)  Est. Patient Level IV [[address]]   Immunization History:  Pneumovax Immunization History:    Pneumovax:  historical (04/23/2006)   Immunization History:  Pneumovax Immunization History:    Pneumovax:  Historical (04/23/2006)

## 2010-10-17 NOTE — Letter (Signed)
[facility]   Imported By: Sherian Rein 01/03/2010 15:04:29  _____________________________________________________________________  External Attachment:    Type:   Image     Comment:   External Document

## 2010-10-17 NOTE — Assessment & Plan Note (Signed)
Summary: 3 MO ROV /NWS  #   Vital Signs:  Patient profile:   77 year old female Weight:      118 pounds BMI:     21.66 O2 Sat:      94 % on Room air Temp:     97.0 degrees F oral Pulse rate:   62 / minute Resp:     16 per minute BP sitting:   130 / 80  (left arm) Cuff size:   regular  Vitals Entered By: Lanier Prude, CMA(AAMA) (March 06, 2010 1:25 PM)  O2 Flow:  Room air CC: 3 mo f/u.  c/o of decreased energy   CC:  3 mo f/u.  c/o of decreased energy.  History of Present Illness: C/o not feeling well - tired and lost wt x 2 months  C/o depression  Current Medications (verified): 1)  Atenolol 25 Mg Tabs (Atenolol) .Marland Kitchen.. 1 Once Daily By Mouth 2)  Tamoxifen Citrate 20 Mg  Tabs (Tamoxifen Citrate) .... Qd 3)  Azathioprine 50 Mg Tabs (Azathioprine) .Marland Kitchen.. 1 By Mouth Once Daily 4)  Xanax 0.25 Mg Tabs (Alprazolam) .Marland Kitchen.. 1 To 2 At Bedtime As Needed 5)  Vitamin D3 1000 Unit  Tabs (Cholecalciferol) .... 2 Qd 6)  Zegerid 40-1100 Mg Caps (Omeprazole-Sodium Bicarbonate) .Marland Kitchen.. 1 By Mouth Daily 7)  Antivert 12.5 Mg Tabs (Meclizine Hcl) .Marland Kitchen.. 1 By Mouth Qid As Needed Vertigo 8)  Synthroid 75 Mcg Tabs (Levothyroxine Sodium) .Marland Kitchen.. 1 By Mouth Qd  Allergies (verified): 1)  ! Codeine 2)  ! Penicillin 3)  ! Celebrex 4)  ! Lipitor 5)  ! Crestor  Past History:  Social History: Last updated: 07/19/2007 Retired Married Never Smoked  Past Medical History: Reviewed history from 10/23/2009 and no changes required. Vit D def Allergic rhinitis Breast cancer, hx of R 08 Diverticulosis, colon Hypertension Hypothyroidism Osteoarthritis Autoimmune hepatitis Giant Cell arteritis 2003 Dr Titus Dubin GERD Depression H zoster 2011  Past Surgical History: Reviewed history from 07/19/2007 and no changes required. Appendectomy Cholecystectomy Hysterectomy Lumpectomy R 08  Family History: Reviewed history from 02/10/2008 and no changes required. Family History Hypertension  Social  History: Reviewed history from 07/19/2007 and no changes required. Retired Married Never Smoked  Review of Systems       The patient complains of anorexia and weight loss.  The patient denies fever, chest pain, syncope, dyspnea on exertion, abdominal pain, and hematochezia.    Physical Exam  General:  Chronically ill-appearing (mild) Mouth:  Oral mucosa and oropharynx without lesions or exudates.  Teeth in good repair. Neck:  No deformities, masses, or tenderness noted. Lungs:  Normal respiratory effort, chest expands symmetrically. Lungs are clear to auscultation, no crackles or wheezes. Heart:  Normal rate and regular rhythm. S1 and S2 normal without gallop, murmur, click, rub or other extra sounds. Abdomen:  Bowel sounds positive,abdomen soft and non-tender without masses, organomegaly or hernias noted. Msk:  No deformity or scoliosis noted of thoracic or lumbar spine.   Extremities:  No clubbing, cyanosis, edema, or deformity noted with normal full range of motion of all joints.   Neurologic:  WNL Skin:  vein teleangioectesias over LEs Cervical Nodes:  No lymphadenopathy noted Inguinal Nodes:  No significant adenopathy Psych:  Oriented X3, normally interactive, good eye contact, not agitated, not suicidal, and slightly anxious.     Impression & Recommendations:  Problem # 1:  FATIGUE (ICD-780.79) ? etiol. Assessment New See below Treat depression  Problem # 2:  WEIGHT LOSS (ICD-783.21) Assessment:  New  Orders: Radiology Referral (Radiology)  Problem # 3:  DEPRESSION (ICD-311) Assessment: Deteriorated  Her updated medication list for this problem includes:    Xanax 0.25 Mg Tabs (Alprazolam) .Marland Kitchen... 1 to 2 at bedtime as needed    Trazodone Hcl 50 Mg Tabs (Trazodone hcl) .Marland Kitchen... 1 by mouth qhs  Problem # 4:  HYPOTHYROIDISM (ICD-244.9) Assessment: Comment Only  Her updated medication list for this problem includes:    Synthroid 75 Mcg Tabs (Levothyroxine sodium) .Marland Kitchen... 1  by mouth qd  Problem # 5:  POLYMYALGIA RHEUMATICA (ICD-725) Assessment: Comment Only No symptoms   Problem # 6:  LIVER FUNCTION TESTS, ABNORMAL, HX OF (ICD-V12.2) Assessment: Comment Only  Orders: Radiology Referral (Radiology)  Complete Medication List: 1)  Atenolol 25 Mg Tabs (Atenolol) .Marland Kitchen.. 1 once daily by mouth 2)  Tamoxifen Citrate 20 Mg Tabs (Tamoxifen citrate) .... Qd 3)  Azathioprine 50 Mg Tabs (Azathioprine) .Marland Kitchen.. 1 by mouth once daily 4)  Xanax 0.25 Mg Tabs (Alprazolam) .Marland Kitchen.. 1 to 2 at bedtime as needed 5)  Vitamin D3 1000 Unit Tabs (Cholecalciferol) .... 2 qd 6)  Zegerid 40-1100 Mg Caps (Omeprazole-sodium bicarbonate) .Marland Kitchen.. 1 by mouth daily 7)  Antivert 12.5 Mg Tabs (Meclizine hcl) .Marland Kitchen.. 1 by mouth qid as needed vertigo 8)  Synthroid 75 Mcg Tabs (Levothyroxine sodium) .Marland Kitchen.. 1 by mouth qd 9)  Trazodone Hcl 50 Mg Tabs (Trazodone hcl) .Marland Kitchen.. 1 by mouth qhs 10)  Vitamin D (ergocalciferol) 50000 Unit Caps (Ergocalciferol) .Marland Kitchen.. 1 by mouth q 1 week x 6 weeks then start vit d 1000 international units qd  Patient Instructions: 1)  Please schedule a follow-up appointment in 2 wks Prescriptions: VITAMIN D (ERGOCALCIFEROL) 50000 UNIT CAPS (ERGOCALCIFEROL) 1 by mouth q 1 week x 6 weeks then start Vit D 1000 international units qd  #6 x 0   Entered and Authorized by:   Tresa Garter MD   Signed by:   Tresa Garter MD on 03/06/2010   Method used:   Electronically to        [facility]  [address] # [id number]* (retail)       [address]       [place], [place]  [address]       Ph: [phone] or [phone]       Fax: [phone]   RxID:   [id number] TRAZODONE HCL 50 MG TABS (TRAZODONE HCL) 1 by mouth qhs  #30 x 6   Entered and Authorized by:   Tresa Garter MD   Signed by:   Tresa Garter MD on 03/06/2010   Method used:   Electronically to        [facility] [address] [id number]* (retail)       [address]       [place], [place]  [address]       Ph: [phone] or [phone]       Fax: [phone]   RxID:   [id number]

## 2010-10-17 NOTE — Miscellaneous (Signed)
[facility]   Imported By: Lester Henderson 12/07/2009 09:54:49  _____________________________________________________________________  External Attachment:    Type:   Image     Comment:   External Document

## 2010-10-18 NOTE — Letter (Signed)
[facility]   Imported By: Sherian Rein 10/17/2009 10:02:28  _____________________________________________________________________  External Attachment:    Type:   Image     Comment:   External Document

## 2010-11-08 ENCOUNTER — Other Ambulatory Visit: Payer: Self-pay

## 2010-11-13 ENCOUNTER — Encounter (INDEPENDENT_AMBULATORY_CARE_PROVIDER_SITE_OTHER): Payer: Self-pay | Admitting: *Deleted

## 2010-11-13 ENCOUNTER — Other Ambulatory Visit: Payer: Self-pay | Admitting: Internal Medicine

## 2010-11-13 ENCOUNTER — Other Ambulatory Visit: Payer: Medicare Other

## 2010-11-13 DIAGNOSIS — Z0000 Encounter for general adult medical examination without abnormal findings: Secondary | ICD-10-CM

## 2010-11-13 LAB — CBC WITH DIFFERENTIAL/PLATELET
Eosinophils Absolute: 0.2 10*3/uL (ref 0.0–0.7)
Lymphocytes Relative: 15.3 % (ref 12.0–46.0)
Lymphs Abs: 0.8 10*3/uL (ref 0.7–4.0)
MCV: 92.9 fl (ref 78.0–100.0)
Monocytes Absolute: 0.5 10*3/uL (ref 0.1–1.0)
Monocytes Relative: 8.9 % (ref 3.0–12.0)
Neutro Abs: 3.9 10*3/uL (ref 1.4–7.7)
Neutrophils Relative %: 70.9 % (ref 43.0–77.0)

## 2010-11-13 LAB — URINALYSIS
Hgb urine dipstick: NEGATIVE
Nitrite: NEGATIVE
Specific Gravity, Urine: 1.01 (ref 1.000–1.030)
Total Protein, Urine: NEGATIVE
pH: 6 (ref 5.0–8.0)

## 2010-11-13 LAB — BASIC METABOLIC PANEL
Calcium: 9.1 mg/dL (ref 8.4–10.5)
Creatinine, Ser: 0.7 mg/dL (ref 0.4–1.2)
GFR: 83.66 mL/min (ref >60.00)

## 2010-11-13 LAB — HEPATIC FUNCTION PANEL
Bilirubin, Direct: 0.2 mg/dL (ref 0.0–0.3)
Total Bilirubin: 1.3 mg/dL — ABNORMAL HIGH (ref 0.3–1.2)
Total Protein: 7.8 g/dL (ref 6.0–8.3)

## 2010-11-13 LAB — LIPID PANEL
HDL: 59.7 mg/dL (ref >39.00)
LDL Cholesterol: 104 mg/dL — ABNORMAL HIGH (ref 0–99)
Total CHOL/HDL Ratio: 3
Triglycerides: 99 mg/dL (ref 0.0–149.0)

## 2010-11-15 ENCOUNTER — Encounter: Payer: Self-pay | Admitting: Internal Medicine

## 2010-11-15 ENCOUNTER — Ambulatory Visit (INDEPENDENT_AMBULATORY_CARE_PROVIDER_SITE_OTHER): Payer: Medicare Other | Admitting: Internal Medicine

## 2010-11-15 DIAGNOSIS — R5383 Other fatigue: Secondary | ICD-10-CM

## 2010-11-15 DIAGNOSIS — R634 Abnormal weight loss: Secondary | ICD-10-CM

## 2010-11-15 DIAGNOSIS — F341 Dysthymic disorder: Secondary | ICD-10-CM

## 2010-11-15 DIAGNOSIS — F329 Major depressive disorder, single episode, unspecified: Secondary | ICD-10-CM

## 2010-11-21 NOTE — Assessment & Plan Note (Signed)
Summary: FU/NWS#   Vital Signs:  Patient profile:   77 year old female Height:      62 inches Weight:      114 pounds BMI:     20.93 Temp:     97.7 degrees F oral Pulse rate:   76 / minute Pulse rhythm:   regular Resp:     16 per minute BP sitting:   150 / 84  (left arm) Cuff size:   regular  Vitals Entered By: Lanier Prude, CMA(AAMA) (November 15, 2010 1:20 PM) CC: 4 mo f/u  Is Patient Diabetic? No   CC:  4 mo f/u .  History of Present Illness: F/u wt loss, anxiety, HTN, GERD. Overall feeling better. Eating better....  Current Medications (verified): 1)  Atenolol 25 Mg Tabs (Atenolol) .Marland Kitchen.. 1 Once Daily By Mouth 2)  Tamoxifen Citrate 20 Mg  Tabs (Tamoxifen Citrate) .... Qd 3)  Azathioprine 50 Mg Tabs (Azathioprine) .Marland Kitchen.. 1 By Mouth Once Daily 4)  Xanax 0.25 Mg Tabs (Alprazolam) .Marland Kitchen.. 1 To 2 At Bedtime As Needed 5)  Vitamin D3 1000 Unit  Tabs (Cholecalciferol) .... 2 Qd 6)  Zegerid 40-1100 Mg Caps (Omeprazole-Sodium Bicarbonate) .Marland Kitchen.. 1 By Mouth Daily 7)  Antivert 12.5 Mg Tabs (Meclizine Hcl) .Marland Kitchen.. 1 By Mouth Qid As Needed Vertigo 8)  Synthroid 75 Mcg Tabs (Levothyroxine Sodium) .Marland Kitchen.. 1 By Mouth Qd 9)  Fluoxetine Hcl 10 Mg Tabs (Fluoxetine Hcl) .Marland Kitchen.. 1 By Mouth Once Daily 10)  Vitamin D3 1000 Unit Tabs (Cholecalciferol) .Marland Kitchen.. 1 By Mouth Once Daily 11)  Vitamin D 1000 Unit Tabs (Cholecalciferol) .Marland Kitchen.. 1 By Mouth Once Daily  Allergies (verified): 1)  ! Codeine 2)  ! Penicillin 3)  ! Celebrex 4)  ! Lipitor 5)  ! Crestor  Past History:  Past Surgical History: Last updated: 07/19/2007 Appendectomy Cholecystectomy Hysterectomy Lumpectomy R 08  Family History: Last updated: 02/10/2008 Family History Hypertension  Past Medical History: Reviewed history from 04/03/2010 and no changes required. Vit D def Allergic rhinitis Breast cancer, hx of R 08 Diverticulosis, colon Hypertension Hypothyroidism Osteoarthritis Autoimmune hepatitis Giant Cell arteritis 2003 Dr  Titus Dubin GERD Depression H zoster 2011 Wt loss 6/11 Vit D def  Review of Systems  The patient denies anorexia, weight loss, weight gain, and abdominal pain.         emotionally better less depressed  Physical Exam  General:  Not ill-appearing and thin Mouth:  Oral mucosa and oropharynx without lesions or exudates.  Teeth in good repair. Lungs:  Normal respiratory effort, chest expands symmetrically. Lungs are clear to auscultation, no crackles or wheezes. Heart:  Normal rate and regular rhythm. S1 and S2 normal without gallop, murmur, click, rub or other extra sounds. Abdomen:  Bowel sounds positive,abdomen soft and non-tender without masses, organomegaly or hernias noted. Msk:  No deformity or scoliosis noted of thoracic or lumbar spine.   Neurologic:  WNL Skin:  vein teleangioectesias over LEs Psych:  Oriented X3, normally interactive, good eye contact, not agitated, not suicidal, and less anxious.  She is a little depressed   Impression & Recommendations:  Problem # 1:  DEPRESSION/ANXIETY (ICD-300.4) Assessment Deteriorated On the regimen of medicine(s) reflected in the chart  Overall better but the strass is worse  Problem # 2:  POLYMYALGIA RHEUMATICA (ICD-725) Assessment: Improved  Problem # 3:  LIVER FUNCTION TESTS, ABNORMAL, HX OF (ICD-V12.2) The labs were reviewed with the patient.   Problem # 4:  WEIGHT LOSS (ICD-783.21) Assessment: Improved  Problem #  5:  BREAST CANCER; OTHER (ICD-174.8) Assessment: Comment Only On the regimen of medicine(s) reflected in the chart    Problem # 6:  HYPERTENSION (ICD-401.9) Assessment: Deteriorated  Her updated medication list for this problem includes:    Atenolol 25 Mg Tabs (Atenolol) .Marland Kitchen... 1 by mouth bid  Complete Medication List: 1)  Atenolol 25 Mg Tabs (Atenolol) .Marland Kitchen.. 1 by mouth bid 2)  Tamoxifen Citrate 20 Mg Tabs (Tamoxifen citrate) .... Qd 3)  Azathioprine 50 Mg Tabs (Azathioprine) .Marland Kitchen.. 1 by mouth once daily 4)   Xanax 0.25 Mg Tabs (Alprazolam) .Marland Kitchen.. 1 to 2 at bedtime as needed 5)  Vitamin D3 1000 Unit Tabs (Cholecalciferol) .... 2 qd 6)  Zegerid 40-1100 Mg Caps (Omeprazole-sodium bicarbonate) .Marland Kitchen.. 1 by mouth daily 7)  Antivert 12.5 Mg Tabs (Meclizine hcl) .Marland Kitchen.. 1 by mouth qid as needed vertigo 8)  Synthroid 75 Mcg Tabs (Levothyroxine sodium) .Marland Kitchen.. 1 by mouth qd 9)  Vitamin D3 1000 Unit Tabs (Cholecalciferol) .Marland Kitchen.. 1 by mouth once daily 10)  Vitamin D 1000 Unit Tabs (Cholecalciferol) .Marland Kitchen.. 1 by mouth once daily 11)  Fluoxetine Hcl 20 Mg Tabs (Fluoxetine hcl) .Marland Kitchen.. 1 by mouth qd  Patient Instructions: 1)  Please schedule a follow-up appointment in 3 months. 2)  Normal BP<130/85 Prescriptions: FLUOXETINE HCL 20 MG TABS (FLUOXETINE HCL) 1 by mouth qd  #30 x 3   Entered and Authorized by:   Tresa Garter MD   Signed by:   Tresa Garter MD on 11/15/2010   Method used:   Print then Give to Patient   RxID:   [id number] ATENOLOL 25 MG TABS (ATENOLOL) 1 by mouth bid  #60 x 11   Entered and Authorized by:   Tresa Garter MD   Signed by:   Tresa Garter MD on 11/15/2010   Method used:   Print then Give to Patient   RxID:   [id number]    Orders Added: 1)  Est. Patient Level IV [[address]]

## 2010-11-25 ENCOUNTER — Telehealth: Payer: Self-pay | Admitting: Internal Medicine

## 2010-12-03 NOTE — Progress Notes (Signed)
Summary: refill request  Phone Note Refill Request Message from:  Fax from Pharmacy on 11/25/2010 @ 10:44am  Refills Requested: Medication #1:  XANAX 0.25 MG TABS 1 to 2 at bedtime as needed [address] [phone] Fax: [phone]  Initial call taken by: Burnard Leigh [facility]),  November 25, 2010 5:07 PM  Follow-up for Phone Call        ok x 3 Follow-up by: Tresa Garter MD,  November 25, 2010 6:14 PM  Additional Follow-up for Phone Call Additional follow up Details #1::        Rx called to pharmacy Additional Follow-up by: Lanier Prude, [facility]),  November 26, 2010 8:14 AM    Prescriptions: Prudy Feeler 0.25 MG TABS (ALPRAZOLAM) 1 to 2 at bedtime as needed  #60 x 3   Entered by:   Lanier Prude, [facility])   Authorized by:   Tresa Garter MD   Signed by:   Lanier Prude, CMA(AAMA) on 11/26/2010   Method used:   Telephoned to ...       [facility]  [address] [id number]* (retail)       [address]       [place], [place]  [address]       Ph: [phone] or [phone]       Fax: [phone]   RxID:   [id number]

## 2010-12-28 ENCOUNTER — Other Ambulatory Visit: Payer: Self-pay | Admitting: Internal Medicine

## 2010-12-30 NOTE — Telephone Encounter (Signed)
OK to fill this prescription with additional refills x11 Thank you!  

## 2011-01-16 ENCOUNTER — Encounter (INDEPENDENT_AMBULATORY_CARE_PROVIDER_SITE_OTHER): Payer: Self-pay | Admitting: General Surgery

## 2011-01-31 NOTE — Op Note (Signed)
NAME:  Michele Diaz, Michele Diaz                  ACCOUNT NO.:  [id number]   MEDICAL RECORD NO.:  [id number]          PATIENT TYPE:  AMB   LOCATION:  [facility]                          FACILITY:  [facility]   PHYSICIAN:  Rose Phi. Maple Hudson, M.D.   DATE OF BIRTH:  30-Aug-1932   DATE OF PROCEDURE:  DATE OF DISCHARGE:                                 OPERATIVE REPORT   .   PREOPERATIVE DIAGNOSIS:  Stage I carcinoma, right breast.   POSTOPERATIVE DIAGNOSIS:  Stage I carcinoma, right breast.   OPERATION:  1.  Blue dye injection.  2.  Right partial mastectomy with needle localization and specimen      mammogram.  3.  Right sentinel lymph node biopsy.   SURGEON:  Rose Phi. Maple Hudson, M.D.   ANESTHESIA:  General.   OPERATIVE PROCEDURE:  Prior to coming to the operating room a localizing  wire had been placed at the breast center for the tumor that was seen in the  central deeper portion of the right breast.  In addition, 5 mL of a mixture  of 2 mL of methylene blue and 3 mL of injectable saline was injected in the  subareolar breast tissue and the breast gently massaged for three minutes.   After suitable general anesthesia was induced, the patient was placed in the  supine position with the arms extended on the arm board, and the breast  prepped and draped in the usual fashion.  She had been previously injected  with both the methylene blue as well as the technetium sulfur colloid.   The lumpectomy was carried out first with a curved circumareolar incision  using the previously placed wire as a guide with the tumor at about the 7  o'clock position deep in the right breast. I  raised up an areolar flap with  some attached breast tissue because the tumor was deep.  We delivered the  wire into the incision, and then I did a wide excision of the wire and  surrounding tissue.  Specimen was oriented for the pathologist.   Specimen mammogram confirmed the removal of lesion and the previously placed  clips and the  pathologist received this specimen for evaluation of the  margins.   While that was all being done a short transverse right axillary incision was  made with dissection through the subcutaneous tissue to the clavipectoral  fascia.  Deep to the fascia was one blue and hot lymph node which I excised  as a sentinel node.  There were no other blue, hot or palpable lymph nodes.  This node was submitted to the pathologist.   The specimen mammogram confirmed the removal of the lesion and the  evaluation of the margins was negative.  Evaluation of the sentinel node was  also negative for metastatic disease.   Both incisions were then thoroughly infiltrated with a local anesthetic  mixture.   They were then both closed in layers with two 3-0 Vicryl and subcuticular 4-  0 Monocryl and Steri-Strips.  Dressings were applied.  The patient  transferred to the recovery room in satisfactory  condition having tolerated  the procedure well.      Rose Phi. Maple Hudson, M.D.  Electronically Signed     PRY/MEDQ  D:  12/03/2005  T:  12/04/2005  Job:  [id number]

## 2011-01-31 NOTE — Cardiovascular Report (Signed)
[place]. [facility]  Patient:    Michele Diaz, Michele Diaz                         MRN: [id number] Proc. Date: 11/03/00 Adm. Date:  [id number] Disc. Date: [id number] Attending:  Lenoria Farrier CC:         Hedwig Morton. Juanda Chance, M.D. [facility]  Kerry Kass, M.D. [facility]  Cardiopulmonary Laboratory   Cardiac Catheterization  PROCEDURES PERFORMED:  Cardiac catheterization.  CLINICAL HISTORY:  Ms. Ralls is 77 years old and has had no prior history of known heart disease.  She has a very strong positive family history for heart disease and has hypertension.  She recently had developed exertional chest tightness.  She is being seen by Dr. Lina Sar for her autoimmune liver disease and with the history of these symptoms she was referred for evaluation and we arranged for her to be scheduled for catheterization.  DESCRIPTION OF PROCEDURE:  The procedure was performed via the right femoral artery using an arterial sheath and 6 French preformed coronary catheters.  A front wall arterial puncture was performed and Omnipaque contrast was used. A distal aortogram was performed to rule out abdominal aortic aneurysm.  We took a picture of the right femoral artery but this was not suitable to close with Perclose.  The patient tolerated the procedure well and left the laboratory in satisfactory condition.  RESULTS: The aortic pressure was 155/68 with a mean of 98.  Left ventricular pressure was 155/20.  The left main coronary artery: The left main coronary artery was free of significant disease.  Left anterior descending: The left anterior descending artery gave rise to two diagonal branches and four septal perforators.  These and the LAD proper were free of significant disease.  Circumflex artery: The circumflex artery gave rise to a large intermediate branch, a marginal branch and an AV branch which terminated in the posterolateral branch.  These vessels were free of  significant disease.  Right coronary artery: The right coronary is a moderate sized vessel that gave rise to a large right ventricular branch, small posterior descending branch and two small posterolateral branches.  These vessels were free of significant disease.  LEFT VENTRICULOGRAPHY: The left ventriculogram was performed in the RAO projection showed good wall motion with no evidence of hypokinesis.  The estimated ejection fraction was 60%.  DISTAL AORTOGRAM: Distal aortogram was performed which showed patent renal artery stenosis with no significant aortoiliac obstruction.  CONCLUSIONS:  Normal coronary angiography and left ventricular wall motion.  RECOMMENDATIONS:  Reassurance. DD:  11/03/00 TD:  11/04/00 Job: [id number]

## 2011-01-31 NOTE — Op Note (Signed)
   NAME:  Michele Diaz, POHLMAN                            ACCOUNT NO.:  [id number]   MEDICAL RECORD NO.:  [id number]                   PATIENT TYPE:  AMB   LOCATION:  DSC                                  FACILITY:  [facility]   PHYSICIAN:  Abigail Miyamoto, M.D.              DATE OF BIRTH:  1932/02/08   DATE OF PROCEDURE:  12/26/2002  DATE OF DISCHARGE:                                 OPERATIVE REPORT   PREOPERATIVE DIAGNOSIS:  Headaches, rule out temporal arteritis.   POSTOPERATIVE DIAGNOSIS:  Headaches, rule out temporal arteritis.   PROCEDURE:  Right temporal artery biopsy.   SURGEON:  Abigail Miyamoto, M.D.   ANESTHESIA:  1% lidocaine and monitored anesthesia care.   ESTIMATED BLOOD LOSS:  Minimal.   DESCRIPTION OF PROCEDURE:  The patient was brought to the operating room and  identified positively.  She was placed supine on the operating table and  anesthesia was induced.  The right temporal area was then prepped and draped  in the usual sterile fashion.  The skin in front of the ear was then  anesthetized with 1% lidocaine.  A small vertical incision was then made in  front of the ear in front of the ear over the palpable temporal artery.  This incision was carried down through the fascia with the electrocautery.  The temporal artery was then easily identified and dissected out.  It was  then tied off proximally and distally with 3-0 silk ties and then transected  with the #15 blade scalpel.  The specimen was then sent to pathology for  identification.  The wound was then irrigated with saline.  Hemostasis  appeared to be achieved.  The subcutaneous layer was then closed with  interrupted 3-0 Vicryl sutures and the skin was closed with a running 4-0  Monocryl.  Steri-Strips were then applied.  The patient tolerated the  procedure well.  All sponge, needle, and instrument counts were correct at  the end of the procedure.  The patient was then extubated in the operating  room and taken  in stable condition to the recovery room.                                               Abigail Miyamoto, M.D.    DB/MEDQ  D:  12/26/2002  T:  12/26/2002  Job:  [id number]

## 2011-02-11 ENCOUNTER — Other Ambulatory Visit: Payer: Self-pay | Admitting: Physician Assistant

## 2011-02-11 ENCOUNTER — Encounter (HOSPITAL_BASED_OUTPATIENT_CLINIC_OR_DEPARTMENT_OTHER): Payer: Medicare Other | Admitting: Oncology

## 2011-02-11 DIAGNOSIS — C50119 Malignant neoplasm of central portion of unspecified female breast: Secondary | ICD-10-CM

## 2011-02-11 DIAGNOSIS — C50519 Malignant neoplasm of lower-outer quadrant of unspecified female breast: Secondary | ICD-10-CM

## 2011-02-11 DIAGNOSIS — Z170 Estrogen receptor positive status [ER+]: Secondary | ICD-10-CM

## 2011-02-11 LAB — CBC WITH DIFFERENTIAL/PLATELET
Basophils Absolute: 0 10*3/uL (ref 0.0–0.1)
Eosinophils Absolute: 0.2 10*3/uL (ref 0.0–0.5)
HGB: 13.2 g/dL (ref 11.6–15.9)
MCV: 91.3 fL (ref 79.5–101.0)
MONO%: 8.4 % (ref 0.0–14.0)
NEUT#: 6.2 10*3/uL (ref 1.5–6.5)
RDW: 13.6 % (ref 11.2–14.5)
lymph#: 0.7 10*3/uL — ABNORMAL LOW (ref 0.9–3.3)

## 2011-02-11 LAB — COMPREHENSIVE METABOLIC PANEL
Albumin: 4.4 g/dL (ref 3.5–5.2)
BUN: 18 mg/dL (ref 6–23)
CO2: 29 mEq/L (ref 19–32)
Calcium: 9.6 mg/dL (ref 8.4–10.5)
Chloride: 100 mEq/L (ref 96–112)
Glucose, Bld: 90 mg/dL (ref 70–99)
Potassium: 4.2 mEq/L (ref 3.5–5.3)

## 2011-02-11 LAB — CANCER ANTIGEN 27.29: CA 27.29: 23 U/mL (ref 0–39)

## 2011-02-17 ENCOUNTER — Encounter: Payer: Self-pay | Admitting: Internal Medicine

## 2011-02-19 ENCOUNTER — Ambulatory Visit (INDEPENDENT_AMBULATORY_CARE_PROVIDER_SITE_OTHER): Payer: Medicare Other | Admitting: Internal Medicine

## 2011-02-19 ENCOUNTER — Encounter: Payer: Self-pay | Admitting: Internal Medicine

## 2011-02-19 DIAGNOSIS — F341 Dysthymic disorder: Secondary | ICD-10-CM

## 2011-02-19 DIAGNOSIS — E559 Vitamin D deficiency, unspecified: Secondary | ICD-10-CM

## 2011-02-19 DIAGNOSIS — R634 Abnormal weight loss: Secondary | ICD-10-CM

## 2011-02-19 DIAGNOSIS — C50919 Malignant neoplasm of unspecified site of unspecified female breast: Secondary | ICD-10-CM

## 2011-02-19 DIAGNOSIS — R209 Unspecified disturbances of skin sensation: Secondary | ICD-10-CM

## 2011-02-19 DIAGNOSIS — R202 Paresthesia of skin: Secondary | ICD-10-CM

## 2011-02-19 DIAGNOSIS — M353 Polymyalgia rheumatica: Secondary | ICD-10-CM

## 2011-02-19 DIAGNOSIS — E039 Hypothyroidism, unspecified: Secondary | ICD-10-CM

## 2011-02-19 NOTE — Progress Notes (Signed)
  Subjective:    Patient ID: Michele Diaz, female    DOB: April 27, 1932, 77 y.o.   MRN: [id number]  HPI   The patient presents for a follow-up of  chronic hypertension, wt loss - corrected; chronic vit D def, depression controlled with medicines Doing well.  Review of Systems  Constitutional: Negative for chills, activity change, appetite change, fatigue and unexpected weight change (wt gain).  HENT: Negative for congestion, mouth sores and sinus pressure.   Eyes: Negative for visual disturbance.  Respiratory: Negative for cough and chest tightness.   Gastrointestinal: Negative for nausea and abdominal pain.  Genitourinary: Negative for frequency, difficulty urinating and vaginal pain.  Musculoskeletal: Negative for back pain and gait problem.  Skin: Negative for pallor and rash.  Neurological: Negative for dizziness, tremors, weakness, numbness and headaches.  Psychiatric/Behavioral: Negative for confusion and sleep disturbance.   Wt Readings from Last 3 Encounters:  02/19/11 120 lb (54.432 kg)  11/15/10 114 lb (51.71 kg)  07/16/10 114 lb (51.71 kg)       Objective:   Physical Exam  Constitutional: She appears well-developed and well-nourished. No distress.  HENT:  Head: Normocephalic.  Right Ear: External ear normal.  Left Ear: External ear normal.  Nose: Nose normal.  Mouth/Throat: Oropharynx is clear and moist.  Eyes: Conjunctivae are normal. Pupils are equal, round, and reactive to light. Right eye exhibits no discharge. Left eye exhibits no discharge.  Neck: Normal range of motion. Neck supple. No JVD present. No tracheal deviation present. No thyromegaly present.  Cardiovascular: Normal rate, regular rhythm and normal heart sounds.   Pulmonary/Chest: No stridor. No respiratory distress. She has no wheezes.  Abdominal: Soft. Bowel sounds are normal. She exhibits no distension and no mass. There is no tenderness. There is no rebound and no guarding.  Musculoskeletal: She  exhibits no edema and no tenderness.  Lymphadenopathy:    She has no cervical adenopathy.  Neurological: She displays normal reflexes. No cranial nerve deficit. She exhibits normal muscle tone. Coordination normal.  Skin: No rash noted. No erythema.  Psychiatric: She has a normal mood and affect. Her behavior is normal. Judgment and thought content normal.   Lab Results  Component Value Date   WBC 5.5 11/13/2010   HGB 13.2 02/11/2011   HCT 38.2 02/11/2011   PLT 147 02/11/2011   CHOL 183 11/13/2010   TRIG 99.0 11/13/2010   HDL 59.70 11/13/2010   LDLDIRECT 125.9 10/06/2007   ALT 14 02/11/2011   ALT 14 02/11/2011   AST 31 02/11/2011   AST 31 02/11/2011   NA 137 02/11/2011   NA 137 02/11/2011   K 4.2 02/11/2011   K 4.2 02/11/2011   CL 100 02/11/2011   CL 100 02/11/2011   CREATININE 0.76 02/11/2011   CREATININE 0.76 02/11/2011   BUN 18 02/11/2011   BUN 18 02/11/2011   CO2 29 02/11/2011   CO2 29 02/11/2011   TSH 1.16 11/13/2010          Assessment & Plan:

## 2011-02-19 NOTE — Assessment & Plan Note (Signed)
Better  

## 2011-02-19 NOTE — Assessment & Plan Note (Signed)
On Rx 

## 2011-02-19 NOTE — Assessment & Plan Note (Signed)
Gaining wt now

## 2011-02-19 NOTE — Assessment & Plan Note (Signed)
On Predn 4 mg a day now

## 2011-04-24 ENCOUNTER — Ambulatory Visit (INDEPENDENT_AMBULATORY_CARE_PROVIDER_SITE_OTHER): Payer: Medicare Other | Admitting: Cardiology

## 2011-04-24 ENCOUNTER — Encounter: Payer: Self-pay | Admitting: Cardiology

## 2011-04-24 DIAGNOSIS — R072 Precordial pain: Secondary | ICD-10-CM

## 2011-04-24 DIAGNOSIS — I10 Essential (primary) hypertension: Secondary | ICD-10-CM

## 2011-04-24 DIAGNOSIS — R079 Chest pain, unspecified: Secondary | ICD-10-CM | POA: Insufficient documentation

## 2011-04-24 NOTE — Patient Instructions (Signed)
Your physician has requested that you have a stress echocardiogram. For further information please visit [email or web address]. Please follow instruction sheet as given.   

## 2011-04-24 NOTE — Assessment & Plan Note (Signed)
Symptoms atypical family history. Plan stress echocardiogram.

## 2011-04-24 NOTE — Assessment & Plan Note (Signed)
Blood pressure elevated but she states typically normal at home. Continue present medications and follow. Management per primary care.

## 2011-04-24 NOTE — Progress Notes (Signed)
HPI: 77 year old female for evaluation of chest pain. Cardiac catheterization in 2002 showed normal LV function, normal coronary arteries no renal artery stenosis. Patient states she has occasional pain in the right substernal area. It is not exertional, pleuritic or positional. It does not radiate. No associated symptoms. Resolves spontaneously. She does not have palpitations or syncope. She had dyspnea with more extreme activity but not with routine activities. It is relieved with rest. No orthopnea, PND, pedal edema or claudication.  Current Outpatient Prescriptions  Medication Sig Dispense Refill  . ALPRAZolam (XANAX) 0.25 MG tablet Take 0.25-0.5 mg by mouth at bedtime as needed.        Marland Kitchen atenolol (TENORMIN) 25 MG tablet Take 25 mg by mouth daily.        Marland Kitchen azaTHIOprine (IMURAN) 50 MG tablet Take 50 mg by mouth daily.        . cholecalciferol (VITAMIN D) 1000 UNITS tablet Take 1,000 Units by mouth daily.        Marland Kitchen FLUoxetine (PROZAC) 20 MG tablet Take 20 mg by mouth daily.        . meclizine (ANTIVERT) 12.5 MG tablet Take 12.5 mg by mouth 4 (four) times daily. As needed for vertigo       . omeprazole-sodium bicarbonate (ZEGERID) 40-1100 MG per capsule Take 1 capsule by mouth daily.        Marland Kitchen SYNTHROID 75 MCG tablet take 1 tablet by mouth once daily  30 tablet  11  . tamoxifen (NOLVADEX) 10 MG tablet Take 10 mg by mouth 2 (two) times daily.          Allergies  Allergen Reactions  . Atorvastatin   . Celecoxib   . Codeine   . Penicillins   . Rosuvastatin     Past Medical History  Diagnosis Date  . Cancer     BREAST. SURVIVIOR  . Hypothyroid   . Asthma   . Arthritis   . Hypertension   . GERD (gastroesophageal reflux disease)   . Depression   . Unspecified vitamin D deficiency   . Allergic rhinitis   . Diverticulitis of colon   . Hepatitis, autoimmune   . Herpes zoster 2011  . Weight loss 6/11  . Giant cell arteritis 2003    devashwar  . Hyperlipidemia     statins increased  LFTs    Past Surgical History  Procedure Date  . Abdominal hysterectomy   . Appendectomy   . Cholecystectomy   . Breast surgery   . Tonsillectomy     History   Social History  . Marital Status: Married    Spouse Name: N/A    Number of Children: 2  . Years of Education: N/A   Occupational History  . retired    Social History Main Topics  . Smoking status: Never Smoker   . Smokeless tobacco: Not on file  . Alcohol Use: No  . Drug Use: No  . Sexually Active: Not Currently   Other Topics Concern  . Not on file   Social History Narrative  . No narrative on file    Family History  Problem Relation Age of Onset  . Diabetes Mother   . Cancer Brother   . Heart disease Brother   . Cancer Sister   . Heart disease Sister     MI at age 3  . Hypertension Other   . Heart attack Mother     died of MI at age 42    ROS: problems with  arthritis but no fevers or chills, productive cough, hemoptysis, dysphasia, odynophagia, melena, hematochezia, dysuria, hematuria, rash, seizure activity, orthopnea, PND, pedal edema, claudication. Remaining systems are negative.  Physical Exam: General:  Well developed/well nourished in NAD Skin warm/dry Patient not depressed No peripheral clubbing Back-normal HEENT-normal/normal eyelids Neck supple/normal carotid upstroke bilaterally; no bruits; no JVD; no thyromegaly chest - CTA/ normal expansion CV - RRR/normal S1 and S2; no murmurs, rubs or gallops;  PMI nondisplaced Abdomen -NT/ND, no HSM, no mass, + bowel sounds, no bruit 2+ femoral pulses, no bruits, evidence of arthritis on examination. Ext-no edema, chords, 2+ DP, varicosities noted. Neuro-grossly nonfocal  ECG NSR with no ST changes.

## 2011-05-01 ENCOUNTER — Other Ambulatory Visit (HOSPITAL_COMMUNITY): Payer: Medicare Other | Admitting: Radiology

## 2011-05-01 ENCOUNTER — Ambulatory Visit (HOSPITAL_COMMUNITY): Payer: Medicare Other | Attending: Internal Medicine | Admitting: Radiology

## 2011-05-01 ENCOUNTER — Ambulatory Visit (HOSPITAL_COMMUNITY): Payer: Medicare Other | Attending: Cardiology | Admitting: Radiology

## 2011-05-01 DIAGNOSIS — R079 Chest pain, unspecified: Secondary | ICD-10-CM | POA: Insufficient documentation

## 2011-05-01 DIAGNOSIS — R5381 Other malaise: Secondary | ICD-10-CM | POA: Insufficient documentation

## 2011-05-01 DIAGNOSIS — R072 Precordial pain: Secondary | ICD-10-CM

## 2011-05-01 DIAGNOSIS — R5383 Other fatigue: Secondary | ICD-10-CM | POA: Insufficient documentation

## 2011-05-01 DIAGNOSIS — M1990 Unspecified osteoarthritis, unspecified site: Secondary | ICD-10-CM | POA: Insufficient documentation

## 2011-05-01 DIAGNOSIS — R0989 Other specified symptoms and signs involving the circulatory and respiratory systems: Secondary | ICD-10-CM

## 2011-05-01 DIAGNOSIS — I10 Essential (primary) hypertension: Secondary | ICD-10-CM

## 2011-05-01 DIAGNOSIS — E039 Hypothyroidism, unspecified: Secondary | ICD-10-CM | POA: Insufficient documentation

## 2011-05-26 ENCOUNTER — Ambulatory Visit (INDEPENDENT_AMBULATORY_CARE_PROVIDER_SITE_OTHER): Payer: Medicare Other | Admitting: Internal Medicine

## 2011-05-26 ENCOUNTER — Other Ambulatory Visit (INDEPENDENT_AMBULATORY_CARE_PROVIDER_SITE_OTHER): Payer: Medicare Other

## 2011-05-26 ENCOUNTER — Encounter: Payer: Self-pay | Admitting: Internal Medicine

## 2011-05-26 VITALS — BP 140/84 | HR 72 | Temp 98.1°F | Resp 16 | Wt 125.0 lb

## 2011-05-26 DIAGNOSIS — Z23 Encounter for immunization: Secondary | ICD-10-CM

## 2011-05-26 DIAGNOSIS — E039 Hypothyroidism, unspecified: Secondary | ICD-10-CM

## 2011-05-26 DIAGNOSIS — M353 Polymyalgia rheumatica: Secondary | ICD-10-CM

## 2011-05-26 DIAGNOSIS — F3289 Other specified depressive episodes: Secondary | ICD-10-CM

## 2011-05-26 DIAGNOSIS — R413 Other amnesia: Secondary | ICD-10-CM

## 2011-05-26 DIAGNOSIS — F341 Dysthymic disorder: Secondary | ICD-10-CM

## 2011-05-26 DIAGNOSIS — F329 Major depressive disorder, single episode, unspecified: Secondary | ICD-10-CM

## 2011-05-26 DIAGNOSIS — R634 Abnormal weight loss: Secondary | ICD-10-CM

## 2011-05-26 DIAGNOSIS — E559 Vitamin D deficiency, unspecified: Secondary | ICD-10-CM

## 2011-05-26 LAB — CBC WITH DIFFERENTIAL/PLATELET
Basophils Relative: 0.2 % (ref 0.0–3.0)
Eosinophils Relative: 1.7 % (ref 0.0–5.0)
HCT: 38.4 % (ref 36.0–46.0)
Lymphs Abs: 0.8 10*3/uL (ref 0.7–4.0)
MCV: 92.4 fl (ref 78.0–100.0)
Monocytes Absolute: 0.6 10*3/uL (ref 0.1–1.0)
Platelets: 158 10*3/uL (ref 150.0–400.0)
WBC: 8.1 10*3/uL (ref 4.5–10.5)

## 2011-05-26 LAB — COMPREHENSIVE METABOLIC PANEL
Alkaline Phosphatase: 49 U/L (ref 39–117)
BUN: 10 mg/dL (ref 6–23)
Glucose, Bld: 95 mg/dL (ref 70–99)
Sodium: 135 mEq/L (ref 135–145)
Total Bilirubin: 1.3 mg/dL — ABNORMAL HIGH (ref 0.3–1.2)

## 2011-05-26 LAB — VITAMIN B12: Vitamin B-12: 344 pg/mL (ref 211–911)

## 2011-05-26 MED ORDER — PREDNISONE 1 MG PO TABS: 1.0000 mg | ORAL_TABLET | Freq: Every day | ORAL | Status: DC

## 2011-05-26 MED ORDER — ALPRAZOLAM 0.25 MG PO TABS: 0.2500 mg | ORAL_TABLET | Freq: Every evening | ORAL | Status: DC | PRN

## 2011-05-26 MED ORDER — DONEPEZIL HCL 5 MG PO TABS: 5.0000 mg | ORAL_TABLET | Freq: Every day | ORAL | Status: DC

## 2011-05-26 NOTE — Assessment & Plan Note (Signed)
Continue with current prescription therapy as reflected on the Med list.  

## 2011-05-26 NOTE — Assessment & Plan Note (Signed)
Chronic - worse  Potential benefits of a long term steroid  use as well as potential risks  and complications were explained to the patient and were aknowledged. Try 3-4 mg/d Predn

## 2011-05-26 NOTE — Progress Notes (Signed)
  Subjective:    Patient ID: Michele Diaz, female    DOB: 03/20/1932, 77 y.o.   MRN: [id number]  HPI  The patient presents for a follow-up of  chronic hypertension, chronic depression, anxiety controlled with medicines  C/o memory problems. On prednisone 2 mg/d. C/o OA in hands - worse.  Review of Systems  Constitutional: Negative for chills, activity change, appetite change, fatigue and unexpected weight change.  HENT: Negative for congestion, mouth sores and sinus pressure.   Eyes: Negative for visual disturbance.  Respiratory: Negative for cough and chest tightness.   Gastrointestinal: Negative for nausea and abdominal pain.  Genitourinary: Negative for frequency, difficulty urinating and vaginal pain.  Musculoskeletal: Negative for back pain and gait problem.  Skin: Negative for pallor and rash.  Neurological: Negative for dizziness, tremors, weakness, numbness and headaches.  Psychiatric/Behavioral: Negative for behavioral problems, confusion and sleep disturbance. The patient is nervous/anxious.    Wt Readings from Last 3 Encounters:  05/26/11 125 lb (56.7 kg)  04/24/11 124 lb (56.246 kg)  02/19/11 120 lb (54.432 kg)       Objective:   Physical Exam  Constitutional: She appears well-developed and well-nourished. No distress.  HENT:  Head: Normocephalic.  Right Ear: External ear normal.  Left Ear: External ear normal.  Nose: Nose normal.  Mouth/Throat: Oropharynx is clear and moist.  Eyes: Conjunctivae are normal. Pupils are equal, round, and reactive to light. Right eye exhibits no discharge. Left eye exhibits no discharge.  Neck: Normal range of motion. Neck supple. No JVD present. No tracheal deviation present. No thyromegaly present.  Cardiovascular: Normal rate, regular rhythm and normal heart sounds.   Pulmonary/Chest: No stridor. No respiratory distress. She has no wheezes.  Abdominal: Soft. Bowel sounds are normal. She exhibits no distension and no mass. There is  no tenderness. There is no rebound and no guarding.  Musculoskeletal: She exhibits no edema and no tenderness.  Lymphadenopathy:    She has no cervical adenopathy.  Neurological: She displays normal reflexes. No cranial nerve deficit. She exhibits normal muscle tone. Coordination normal.  Skin: No rash noted. No erythema.  Psychiatric: Her behavior is normal. Judgment and thought content normal.     Recalled 2.5 /3 in 10 min     Assessment & Plan:

## 2011-05-26 NOTE — Assessment & Plan Note (Signed)
2012  Options discussed She wants to try Aricept

## 2011-05-28 ENCOUNTER — Telehealth: Payer: Self-pay | Admitting: Internal Medicine

## 2011-05-28 MED ORDER — ERGOCALCIFEROL 1.25 MG (50000 UT) PO CAPS: 50000.0000 [IU] | ORAL_CAPSULE | ORAL | Status: DC

## 2011-05-28 NOTE — Telephone Encounter (Signed)
Michele Diaz , please, inform the patient: labs are OK, except for low Vit D. Take Rx vit D   Please, keep  next office visit appointment.   Thank you !

## 2011-05-28 NOTE — Telephone Encounter (Signed)
Called pt. No answer. Will try again tom.

## 2011-05-28 NOTE — Telephone Encounter (Signed)
Patient informed. 

## 2011-06-06 ENCOUNTER — Telehealth: Payer: Self-pay | Admitting: *Deleted

## 2011-06-06 NOTE — Telephone Encounter (Signed)
Patient informed. 

## 2011-06-06 NOTE — Telephone Encounter (Signed)
I doubt it was Vit D, however - it is ok to stop vit d x 2 wks and see if better Thx

## 2011-06-06 NOTE — Telephone Encounter (Signed)
Pt req a call back from Dr Posey Rea. She recently started Vit D and c/o leg and wrist cramps, worse at night. Also c/o elevated BP 150/100 at night. Pt is worried symptoms are coming from Vit D. Please advise.

## 2011-07-11 ENCOUNTER — Telehealth: Payer: Self-pay | Admitting: *Deleted

## 2011-07-11 NOTE — Telephone Encounter (Signed)
Pt states he BP has been running high 140s over 90s. Pt states he BP get higher when she lays down. She wants to see you and only you. I offered pt appointment with another MD today,she would like to be worked in on Tues oct 30 her husband is having endo upstairs at 730 and would like to be worked in . Please advise

## 2011-07-14 NOTE — Telephone Encounter (Signed)
Ok to w/in thx

## 2011-07-14 NOTE — Telephone Encounter (Signed)
Informed pt .

## 2011-07-15 ENCOUNTER — Telehealth: Payer: Self-pay | Admitting: Internal Medicine

## 2011-07-15 ENCOUNTER — Other Ambulatory Visit (INDEPENDENT_AMBULATORY_CARE_PROVIDER_SITE_OTHER): Payer: Medicare Other

## 2011-07-15 ENCOUNTER — Encounter: Payer: Self-pay | Admitting: Internal Medicine

## 2011-07-15 ENCOUNTER — Ambulatory Visit (INDEPENDENT_AMBULATORY_CARE_PROVIDER_SITE_OTHER): Payer: Medicare Other | Admitting: Internal Medicine

## 2011-07-15 VITALS — BP 160/92 | HR 60 | Temp 98.0°F | Wt 122.0 lb

## 2011-07-15 DIAGNOSIS — M353 Polymyalgia rheumatica: Secondary | ICD-10-CM

## 2011-07-15 DIAGNOSIS — Z862 Personal history of diseases of the blood and blood-forming organs and certain disorders involving the immune mechanism: Secondary | ICD-10-CM

## 2011-07-15 DIAGNOSIS — I10 Essential (primary) hypertension: Secondary | ICD-10-CM

## 2011-07-15 DIAGNOSIS — R413 Other amnesia: Secondary | ICD-10-CM

## 2011-07-15 DIAGNOSIS — F341 Dysthymic disorder: Secondary | ICD-10-CM

## 2011-07-15 DIAGNOSIS — R5381 Other malaise: Secondary | ICD-10-CM

## 2011-07-15 LAB — CBC WITH DIFFERENTIAL/PLATELET
Eosinophils Relative: 4.1 % (ref 0.0–5.0)
HCT: 38.5 % (ref 36.0–46.0)
Hemoglobin: 13.2 g/dL (ref 12.0–15.0)
Lymphs Abs: 0.7 10*3/uL (ref 0.7–4.0)
Monocytes Relative: 12.4 % — ABNORMAL HIGH (ref 3.0–12.0)
Neutro Abs: 3.8 10*3/uL (ref 1.4–7.7)
WBC: 5.5 10*3/uL (ref 4.5–10.5)

## 2011-07-15 LAB — BASIC METABOLIC PANEL
Calcium: 9.4 mg/dL (ref 8.4–10.5)
GFR: 76.15 mL/min (ref >60.00)
Glucose, Bld: 84 mg/dL (ref 70–99)
Sodium: 139 mEq/L (ref 135–145)

## 2011-07-15 LAB — LIPID PANEL
HDL: 59.7 mg/dL (ref >39.00)
Total CHOL/HDL Ratio: 3
Triglycerides: 124 mg/dL (ref 0.0–149.0)

## 2011-07-15 LAB — HEPATIC FUNCTION PANEL
ALT: 54 U/L — ABNORMAL HIGH (ref 0–35)
AST: 70 U/L — ABNORMAL HIGH (ref 0–37)
Albumin: 4.2 g/dL (ref 3.5–5.2)

## 2011-07-15 LAB — SEDIMENTATION RATE: Sed Rate: 47 mm/hr — ABNORMAL HIGH (ref 0–22)

## 2011-07-15 MED ORDER — CARVEDILOL 25 MG PO TABS: 25.0000 mg | ORAL_TABLET | Freq: Two times a day (BID) | ORAL | Status: DC

## 2011-07-15 MED ORDER — HYDROCHLOROTHIAZIDE 12.5 MG PO TABS: 12.5000 mg | ORAL_TABLET | Freq: Every day | ORAL | Status: DC

## 2011-07-15 MED ORDER — PREDNISONE 10 MG PO TABS: 10.0000 mg | ORAL_TABLET | Freq: Every day | ORAL | Status: DC

## 2011-07-15 NOTE — Assessment & Plan Note (Signed)
She is c/o HAs now. Will increase Predn to 10 mg/d. F/u w/Dr Corliss Skains

## 2011-07-15 NOTE — Telephone Encounter (Signed)
Pt informed. OV scheduled/ labs entered.

## 2011-07-15 NOTE — Telephone Encounter (Signed)
Michele Diaz, please, inform patient that all labs with elev LFT. RTC in 3-4 wks with LFT, ESR , BMET and CK prior Thx

## 2011-07-15 NOTE — Assessment & Plan Note (Signed)
Worse lately. D/c Tenormin. Start Coreg and HCTZ

## 2011-07-15 NOTE — Assessment & Plan Note (Signed)
Worse lately while on 3 mg of Predn/d. Increase Prednisone

## 2011-07-15 NOTE — Progress Notes (Signed)
  Subjective:    Patient ID: Michele Diaz, female    DOB: [birth year], 77 y.o.   MRN: [id number] C/o stress w/dog and anxiety. F/u HTN up and down. C/o HAs in B temples like before. C/o fatigue. Hypertension Pertinent negatives include no headaches.  Headache  Pertinent negatives include no abdominal pain, back pain, coughing, dizziness, nausea, numbness, sinus pressure or weakness. Her past medical history is significant for hypertension.   C/o fatigue, anxiety, BP being up and down BP Readings from Last 3 Encounters:  07/15/11 160/92  05/26/11 140/84  04/24/11 154/83     Review of Systems  Constitutional: Positive for fatigue. Negative for chills, activity change, appetite change and unexpected weight change.  HENT: Negative for congestion, mouth sores and sinus pressure.   Eyes: Negative for visual disturbance.  Respiratory: Negative for cough and chest tightness.   Gastrointestinal: Negative for nausea and abdominal pain.  Genitourinary: Negative for frequency, difficulty urinating and vaginal pain.  Musculoskeletal: Negative for back pain and gait problem.  Skin: Negative for pallor and rash.  Neurological: Negative for dizziness, tremors, weakness, numbness and headaches.  Psychiatric/Behavioral: Positive for sleep disturbance. Negative for suicidal ideas and confusion. The patient is nervous/anxious.        Objective:   Physical Exam  Constitutional: She appears well-developed and well-nourished. No distress.  HENT:  Head: Normocephalic.  Right Ear: External ear normal.  Left Ear: External ear normal.  Nose: Nose normal.  Mouth/Throat: Oropharynx is clear and moist.  Eyes: Conjunctivae are normal. Pupils are equal, round, and reactive to light. Right eye exhibits no discharge. Left eye exhibits no discharge.  Neck: Normal range of motion. Neck supple. No JVD present. No tracheal deviation present. No thyromegaly present.  Cardiovascular: Normal rate, regular rhythm and  normal heart sounds.   Pulmonary/Chest: No stridor. No respiratory distress. She has no wheezes.  Abdominal: Soft. Bowel sounds are normal. She exhibits no distension and no mass. There is no tenderness. There is no rebound and no guarding.  Musculoskeletal: She exhibits no edema and no tenderness.  Lymphadenopathy:    She has no cervical adenopathy.  Neurological: She displays normal reflexes. No cranial nerve deficit. She exhibits normal muscle tone. Coordination normal.  Skin: No rash noted. No erythema.  Psychiatric: She has a normal mood and affect. Her behavior is normal. Judgment and thought content normal.          Assessment & Plan:

## 2011-07-15 NOTE — Assessment & Plan Note (Signed)
Worse. Treat PMR

## 2011-07-15 NOTE — Assessment & Plan Note (Signed)
Continue with current prescription therapy as reflected on the Med list.  

## 2011-07-29 ENCOUNTER — Encounter (INDEPENDENT_AMBULATORY_CARE_PROVIDER_SITE_OTHER): Payer: Self-pay | Admitting: General Surgery

## 2011-07-30 ENCOUNTER — Encounter: Payer: Self-pay | Admitting: General Surgery

## 2011-08-06 ENCOUNTER — Other Ambulatory Visit (INDEPENDENT_AMBULATORY_CARE_PROVIDER_SITE_OTHER): Payer: Medicare Other

## 2011-08-06 DIAGNOSIS — M353 Polymyalgia rheumatica: Secondary | ICD-10-CM

## 2011-08-06 DIAGNOSIS — Z862 Personal history of diseases of the blood and blood-forming organs and certain disorders involving the immune mechanism: Secondary | ICD-10-CM

## 2011-08-06 DIAGNOSIS — Z8639 Personal history of other endocrine, nutritional and metabolic disease: Secondary | ICD-10-CM

## 2011-08-06 LAB — BASIC METABOLIC PANEL
CO2: 27 mEq/L (ref 19–32)
Calcium: 9 mg/dL (ref 8.4–10.5)
GFR: 73.94 mL/min (ref >60.00)
Sodium: 136 mEq/L (ref 135–145)

## 2011-08-06 LAB — HEPATIC FUNCTION PANEL
ALT: 30 U/L (ref 0–35)
AST: 43 U/L — ABNORMAL HIGH (ref 0–37)
Albumin: 3.9 g/dL (ref 3.5–5.2)
Total Protein: 7.7 g/dL (ref 6.0–8.3)

## 2011-08-06 LAB — CK: Total CK: 133 U/L (ref 7–177)

## 2011-08-06 LAB — SEDIMENTATION RATE: Sed Rate: 48 mm/hr — ABNORMAL HIGH (ref 0–22)

## 2011-08-11 ENCOUNTER — Encounter: Payer: Self-pay | Admitting: Internal Medicine

## 2011-08-11 ENCOUNTER — Ambulatory Visit (INDEPENDENT_AMBULATORY_CARE_PROVIDER_SITE_OTHER): Payer: Medicare Other | Admitting: Internal Medicine

## 2011-08-11 DIAGNOSIS — R413 Other amnesia: Secondary | ICD-10-CM

## 2011-08-11 DIAGNOSIS — F341 Dysthymic disorder: Secondary | ICD-10-CM

## 2011-08-11 DIAGNOSIS — I10 Essential (primary) hypertension: Secondary | ICD-10-CM

## 2011-08-11 DIAGNOSIS — K219 Gastro-esophageal reflux disease without esophagitis: Secondary | ICD-10-CM

## 2011-08-11 DIAGNOSIS — C50919 Malignant neoplasm of unspecified site of unspecified female breast: Secondary | ICD-10-CM

## 2011-08-11 DIAGNOSIS — E039 Hypothyroidism, unspecified: Secondary | ICD-10-CM

## 2011-08-11 DIAGNOSIS — R634 Abnormal weight loss: Secondary | ICD-10-CM

## 2011-08-11 DIAGNOSIS — R5381 Other malaise: Secondary | ICD-10-CM

## 2011-08-11 DIAGNOSIS — M353 Polymyalgia rheumatica: Secondary | ICD-10-CM

## 2011-08-11 MED ORDER — FLUOXETINE HCL 20 MG PO TABS: 20.0000 mg | ORAL_TABLET | Freq: Every day | ORAL | Status: DC

## 2011-08-11 MED ORDER — ALPRAZOLAM 0.5 MG PO TABS: 0.2500 mg | ORAL_TABLET | Freq: Three times a day (TID) | ORAL | Status: DC | PRN

## 2011-08-11 MED ORDER — LEVOTHYROXINE SODIUM 75 MCG PO TABS: 75.0000 ug | ORAL_TABLET | Freq: Every day | ORAL | Status: DC

## 2011-08-11 MED ORDER — OMEPRAZOLE-SODIUM BICARBONATE 40-1100 MG PO CAPS: 1.0000 | ORAL_CAPSULE | Freq: Every day | ORAL | Status: DC

## 2011-08-11 MED ORDER — DONEPEZIL HCL 5 MG PO TABS: 5.0000 mg | ORAL_TABLET | Freq: Every day | ORAL | Status: DC

## 2011-08-11 MED ORDER — AZATHIOPRINE 50 MG PO TABS: 50.0000 mg | ORAL_TABLET | Freq: Every day | ORAL | Status: DC

## 2011-08-11 MED ORDER — CARVEDILOL 25 MG PO TABS: 25.0000 mg | ORAL_TABLET | Freq: Two times a day (BID) | ORAL | Status: DC

## 2011-08-11 MED ORDER — PREDNISONE 10 MG PO TABS: 5.0000 mg | ORAL_TABLET | Freq: Every day | ORAL | Status: DC

## 2011-08-11 MED ORDER — HYDROCHLOROTHIAZIDE 12.5 MG PO TABS: 12.5000 mg | ORAL_TABLET | Freq: Every day | ORAL | Status: DC

## 2011-08-11 MED ORDER — PREDNISONE 1 MG PO TABS: 4.0000 mg | ORAL_TABLET | Freq: Every day | ORAL | Status: DC

## 2011-08-11 NOTE — Assessment & Plan Note (Addendum)
We will monitor 

## 2011-08-11 NOTE — Assessment & Plan Note (Signed)
Continue with current prescription therapy as reflected on the Med list.  

## 2011-08-11 NOTE — Progress Notes (Signed)
  Subjective:    Patient ID: Michele Diaz, female    DOB: [birth year], 77 y.o.   MRN: [id number]  HPI  The patient presents for a follow-up of  Chronic depression hypertension, chronic dyslipidemia, PMR better controlled with medicines; better on Prednisone. Stressed w/husband - bone ca. No HA now    Review of Systems  Constitutional: Positive for fatigue. Negative for fever, chills, diaphoresis, activity change, appetite change and unexpected weight change.  HENT: Negative for hearing loss, ear pain, nosebleeds, congestion, sore throat, facial swelling, rhinorrhea, sneezing, mouth sores, trouble swallowing, neck pain, neck stiffness, postnasal drip, sinus pressure and tinnitus.   Eyes: Negative for pain, discharge, redness, itching and visual disturbance.  Respiratory: Negative for cough, chest tightness, shortness of breath, wheezing and stridor.   Cardiovascular: Negative for chest pain, palpitations and leg swelling.  Gastrointestinal: Negative for nausea, abdominal pain, diarrhea, constipation, blood in stool, abdominal distention, anal bleeding and rectal pain.  Genitourinary: Negative for dysuria, urgency, frequency, hematuria, flank pain, vaginal bleeding, vaginal discharge, difficulty urinating, genital sores, vaginal pain and pelvic pain.  Musculoskeletal: Positive for myalgias and arthralgias. Negative for back pain, joint swelling and gait problem.       Better  Skin: Negative.  Negative for pallor and rash.  Neurological: Negative for dizziness, tremors, seizures, syncope, speech difficulty, weakness, numbness and headaches.  Hematological: Negative for adenopathy. Does not bruise/bleed easily.  Psychiatric/Behavioral: Negative for suicidal ideas, behavioral problems, confusion, sleep disturbance, dysphoric mood and decreased concentration. The patient is not nervous/anxious.        Objective:   Physical Exam  Constitutional: She appears well-developed and well-nourished. No  distress.  HENT:  Head: Normocephalic.  Right Ear: External ear normal.  Left Ear: External ear normal.  Nose: Nose normal.  Mouth/Throat: Oropharynx is clear and moist.  Eyes: Conjunctivae are normal. Pupils are equal, round, and reactive to light. Right eye exhibits no discharge. Left eye exhibits no discharge.  Neck: Normal range of motion. Neck supple. No JVD present. No tracheal deviation present. No thyromegaly present.  Cardiovascular: Normal rate, regular rhythm and normal heart sounds.   Pulmonary/Chest: No stridor. No respiratory distress. She has no wheezes.  Abdominal: Soft. Bowel sounds are normal. She exhibits no distension and no mass. There is no tenderness. There is no rebound and no guarding.  Musculoskeletal: She exhibits no edema and no tenderness.  Lymphadenopathy:    She has no cervical adenopathy.  Neurological: She displays normal reflexes. No cranial nerve deficit. She exhibits normal muscle tone. Coordination normal.  Skin: No rash noted. No erythema.  Psychiatric: She has a normal mood and affect. Her behavior is normal. Judgment and thought content normal.       Stressed w/her husband bone cancer          Assessment & Plan:

## 2011-08-11 NOTE — Assessment & Plan Note (Signed)
Mild now

## 2011-08-11 NOTE — Assessment & Plan Note (Signed)
Stable no change

## 2011-08-11 NOTE — Assessment & Plan Note (Signed)
Wt Readings from Last 3 Encounters:  08/11/11 121 lb (54.885 kg)  07/15/11 122 lb (55.339 kg)  05/26/11 125 lb (56.7 kg)

## 2011-08-11 NOTE — Assessment & Plan Note (Signed)
Better on rx 

## 2011-08-11 NOTE — Patient Instructions (Signed)
Prednisone: take 9mg /day total

## 2011-08-26 ENCOUNTER — Encounter: Payer: Medicare Other | Admitting: Internal Medicine

## 2011-08-26 DIAGNOSIS — Z0289 Encounter for other administrative examinations: Secondary | ICD-10-CM

## 2011-09-19 ENCOUNTER — Encounter: Payer: Self-pay | Admitting: Internal Medicine

## 2011-09-19 ENCOUNTER — Ambulatory Visit (INDEPENDENT_AMBULATORY_CARE_PROVIDER_SITE_OTHER): Payer: Medicare Other | Admitting: Internal Medicine

## 2011-09-19 DIAGNOSIS — I10 Essential (primary) hypertension: Secondary | ICD-10-CM | POA: Diagnosis not present

## 2011-09-19 DIAGNOSIS — F4321 Adjustment disorder with depressed mood: Secondary | ICD-10-CM | POA: Diagnosis not present

## 2011-09-19 DIAGNOSIS — F341 Dysthymic disorder: Secondary | ICD-10-CM

## 2011-09-19 DIAGNOSIS — M353 Polymyalgia rheumatica: Secondary | ICD-10-CM | POA: Diagnosis not present

## 2011-09-19 DIAGNOSIS — E039 Hypothyroidism, unspecified: Secondary | ICD-10-CM

## 2011-09-19 NOTE — Assessment & Plan Note (Signed)
Oct 26, 2010 her husband died from ca Discussed

## 2011-09-19 NOTE — Assessment & Plan Note (Signed)
Continue with current prescription therapy as reflected on the Med list.  

## 2011-09-19 NOTE — Progress Notes (Signed)
  Subjective:    Patient ID: Michele Diaz, female    DOB: Oct 31, 1931, 78 y.o.   MRN: [id number]  HPI The patient presents for a follow-up of  chronic hypertension, chronic dyslipidemia, memory problems controlled with medicines Her husband died in 29-Aug-2011    Review of Systems  Constitutional: Negative for chills, activity change, appetite change, fatigue and unexpected weight change.  HENT: Negative for congestion, mouth sores and sinus pressure.   Eyes: Negative for visual disturbance.  Respiratory: Negative for cough and chest tightness.   Gastrointestinal: Negative for nausea and abdominal pain.  Genitourinary: Negative for frequency, difficulty urinating and vaginal pain.  Musculoskeletal: Negative for back pain and gait problem.  Skin: Negative for pallor and rash.  Neurological: Negative for dizziness, tremors, weakness, numbness and headaches.  Psychiatric/Behavioral: Positive for sleep disturbance. Negative for suicidal ideas and confusion. The patient is nervous/anxious.        Objective:   Physical Exam  Constitutional: She appears well-developed. No distress.  HENT:  Head: Normocephalic.  Right Ear: External ear normal.  Left Ear: External ear normal.  Nose: Nose normal.  Mouth/Throat: Oropharynx is clear and moist.  Eyes: Conjunctivae are normal. Pupils are equal, round, and reactive to light. Right eye exhibits no discharge. Left eye exhibits no discharge.  Neck: Normal range of motion. Neck supple. No JVD present. No tracheal deviation present. No thyromegaly present.  Cardiovascular: Normal rate, regular rhythm and normal heart sounds.   Pulmonary/Chest: No stridor. No respiratory distress. She has no wheezes.  Abdominal: Soft. Bowel sounds are normal. She exhibits no distension and no mass. There is no tenderness. There is no rebound and no guarding.  Musculoskeletal: She exhibits no edema and no tenderness.  Lymphadenopathy:    She has no cervical  adenopathy.  Neurological: She displays normal reflexes. No cranial nerve deficit. She exhibits normal muscle tone. Coordination normal.  Skin: No rash noted. No erythema.  Psychiatric: Her behavior is normal. Judgment and thought content normal.       sad          Assessment & Plan:

## 2011-12-03 ENCOUNTER — Ambulatory Visit (INDEPENDENT_AMBULATORY_CARE_PROVIDER_SITE_OTHER): Payer: Medicare Other | Admitting: Internal Medicine

## 2011-12-03 ENCOUNTER — Encounter: Payer: Self-pay | Admitting: Internal Medicine

## 2011-12-03 VITALS — BP 130/82 | HR 84 | Temp 98.1°F | Resp 16 | Wt 109.0 lb

## 2011-12-03 DIAGNOSIS — I10 Essential (primary) hypertension: Secondary | ICD-10-CM

## 2011-12-03 DIAGNOSIS — E039 Hypothyroidism, unspecified: Secondary | ICD-10-CM

## 2011-12-03 DIAGNOSIS — F4321 Adjustment disorder with depressed mood: Secondary | ICD-10-CM

## 2011-12-03 DIAGNOSIS — R634 Abnormal weight loss: Secondary | ICD-10-CM

## 2011-12-03 DIAGNOSIS — M353 Polymyalgia rheumatica: Secondary | ICD-10-CM | POA: Diagnosis not present

## 2011-12-03 DIAGNOSIS — R413 Other amnesia: Secondary | ICD-10-CM

## 2011-12-03 MED ORDER — MIRTAZAPINE 15 MG PO TABS: 15.0000 mg | ORAL_TABLET | Freq: Every day | ORAL | Status: DC

## 2011-12-03 NOTE — Assessment & Plan Note (Signed)
Continue with current prescription therapy as reflected on the Med list.  

## 2011-12-03 NOTE — Progress Notes (Signed)
Patient ID: Michele Diaz, female   DOB: 15-Sep-1932, 78 y.o.   MRN: [id number]  Subjective:    Patient ID: Michele Diaz, female    DOB: Nov 20, 1931, 78 y.o.   MRN: [id number]  HPI The patient presents for a follow-up of  chronic hypertension, chronic dyslipidemia, memory problems controlled with medicines Her husband died in 01-24-13grieving Family is worried about her wt loss  Wt Readings from Last 3 Encounters:  12/03/11 109 lb (49.442 kg)  09/19/11 116 lb 8 oz (52.844 kg)  08/11/11 121 lb (54.885 kg)   BP Readings from Last 3 Encounters:  12/03/11 130/82  09/19/11 122/74  08/11/11 118/80        Review of Systems  Constitutional: Positive for unexpected weight change. Negative for fever, chills, activity change, appetite change and fatigue.  HENT: Negative for congestion, mouth sores and sinus pressure.   Eyes: Negative for visual disturbance.  Respiratory: Negative for cough and chest tightness.   Gastrointestinal: Negative for nausea and abdominal pain.  Genitourinary: Negative for frequency, difficulty urinating and vaginal pain.  Musculoskeletal: Negative for back pain and gait problem.  Skin: Negative for pallor and rash.  Neurological: Negative for dizziness, tremors, weakness, numbness and headaches.  Psychiatric/Behavioral: Positive for sleep disturbance. Negative for suicidal ideas and confusion. The patient is nervous/anxious.        Objective:   Physical Exam  Constitutional: She appears well-developed. No distress.       thin  HENT:  Head: Normocephalic.  Right Ear: External ear normal.  Left Ear: External ear normal.  Nose: Nose normal.  Mouth/Throat: Oropharynx is clear and moist.  Eyes: Conjunctivae are normal. Pupils are equal, round, and reactive to light. Right eye exhibits no discharge. Left eye exhibits no discharge.  Neck: Normal range of motion. Neck supple. No JVD present. No tracheal deviation present. No thyromegaly present.    Cardiovascular: Normal rate, regular rhythm and normal heart sounds.   Pulmonary/Chest: No stridor. No respiratory distress. She has no wheezes.  Abdominal: Soft. Bowel sounds are normal. She exhibits no distension and no mass. There is no tenderness. There is no rebound and no guarding.  Musculoskeletal: She exhibits no edema and no tenderness.  Lymphadenopathy:    She has no cervical adenopathy.  Neurological: She displays normal reflexes. No cranial nerve deficit. She exhibits normal muscle tone. Coordination normal.  Skin: No rash noted. No erythema.  Psychiatric: Her behavior is normal. Judgment and thought content normal.       sad   Lab Results  Component Value Date   WBC 5.5 07/15/2011   HGB 13.2 07/15/2011   HCT 38.5 07/15/2011   PLT 154.0 07/15/2011   GLUCOSE 100* 08/06/2011   CHOL 193 07/15/2011   TRIG 124.0 07/15/2011   HDL 59.70 07/15/2011   LDLDIRECT 125.9 10/06/2007   LDLCALC 109* 07/15/2011   ALT 30 08/06/2011   AST 43* 08/06/2011   NA 136 08/06/2011   K 3.7 08/06/2011   CL 101 08/06/2011   CREATININE 0.8 08/06/2011   BUN 14 08/06/2011   CO2 27 08/06/2011   TSH 1.16 11/13/2010          Assessment & Plan:

## 2011-12-03 NOTE — Assessment & Plan Note (Signed)
Recurrent - depression and grief related 3/13 Start Remerom

## 2011-12-03 NOTE — Assessment & Plan Note (Signed)
Better  

## 2011-12-03 NOTE — Assessment & Plan Note (Signed)
Grief discussed 

## 2012-02-09 ENCOUNTER — Other Ambulatory Visit: Payer: Self-pay | Admitting: Internal Medicine

## 2012-02-10 ENCOUNTER — Telehealth: Payer: Self-pay

## 2012-02-10 NOTE — Telephone Encounter (Signed)
Please advise if ok to refill alprazolam 0.5 for this plot pt. Patient last seen 12/03/11 and medication last filled 08/11/11 #90/5rf Thanks

## 2012-02-10 NOTE — Telephone Encounter (Signed)
Ok to refill xanax x3 

## 2012-02-11 ENCOUNTER — Other Ambulatory Visit: Payer: Self-pay | Admitting: *Deleted

## 2012-02-11 MED ORDER — ALPRAZOLAM 0.5 MG PO TABS: 0.2500 mg | ORAL_TABLET | Freq: Three times a day (TID) | ORAL | Status: DC | PRN

## 2012-02-11 NOTE — Telephone Encounter (Signed)
Requesting renewal on alprazolam. Last filled 12/11/11.MD out of office. Is this ok to refill... 02/11/12@11 :10am/LMB

## 2012-02-11 NOTE — Telephone Encounter (Signed)
Faxed script back to [facility]... 02/11/12@2 ;23pm/LMB

## 2012-02-11 NOTE — Telephone Encounter (Signed)
Rx called in 

## 2012-02-11 NOTE — Telephone Encounter (Signed)
Yes - 30d ok

## 2012-02-25 ENCOUNTER — Encounter: Payer: Self-pay | Admitting: Internal Medicine

## 2012-02-25 ENCOUNTER — Ambulatory Visit (INDEPENDENT_AMBULATORY_CARE_PROVIDER_SITE_OTHER): Payer: Medicare Other | Admitting: Internal Medicine

## 2012-02-25 VITALS — BP 130/82 | HR 80 | Temp 97.7°F | Resp 16 | Wt 113.0 lb

## 2012-02-25 DIAGNOSIS — R634 Abnormal weight loss: Secondary | ICD-10-CM

## 2012-02-25 DIAGNOSIS — M353 Polymyalgia rheumatica: Secondary | ICD-10-CM

## 2012-02-25 DIAGNOSIS — I10 Essential (primary) hypertension: Secondary | ICD-10-CM | POA: Diagnosis not present

## 2012-02-25 DIAGNOSIS — E559 Vitamin D deficiency, unspecified: Secondary | ICD-10-CM

## 2012-02-25 DIAGNOSIS — J309 Allergic rhinitis, unspecified: Secondary | ICD-10-CM

## 2012-02-25 DIAGNOSIS — F341 Dysthymic disorder: Secondary | ICD-10-CM

## 2012-02-25 MED ORDER — OMEPRAZOLE-SODIUM BICARBONATE 40-1100 MG PO CAPS: 1.0000 | ORAL_CAPSULE | Freq: Every day | ORAL | Status: DC

## 2012-02-25 NOTE — Assessment & Plan Note (Signed)
Continue with current prescription therapy as reflected on the Med list.  

## 2012-02-25 NOTE — Assessment & Plan Note (Signed)
On 1 mg Predn/d now

## 2012-02-25 NOTE — Progress Notes (Signed)
  Subjective:    Patient ID: Michele Diaz, female    DOB: [birth year], 78 y.o.   MRN: [id number]  HPI The patient presents for a follow-up of  chronic hypertension, chronic dyslipidemia, memory problems controlled with medicines Her husband died in 01/06/2013grieving Family is worried about her wt loss - better   Wt Readings from Last 3 Encounters:  02/25/12 113 lb (51.256 kg)  12/03/11 109 lb (49.442 kg)  09/19/11 116 lb 8 oz (52.844 kg)   BP Readings from Last 3 Encounters:  02/25/12 130/82  12/03/11 130/82  09/19/11 122/74        Review of Systems  Constitutional: Positive for unexpected weight change. Negative for fever, chills, activity change, appetite change and fatigue.  HENT: Negative for congestion, mouth sores and sinus pressure.   Eyes: Negative for visual disturbance.  Respiratory: Negative for cough and chest tightness.   Gastrointestinal: Negative for nausea and abdominal pain.  Genitourinary: Negative for frequency, difficulty urinating and vaginal pain.  Musculoskeletal: Negative for back pain and gait problem.  Skin: Negative for pallor and rash.  Neurological: Negative for dizziness, tremors, weakness, numbness and headaches.  Psychiatric/Behavioral: Positive for disturbed wake/sleep cycle. Negative for suicidal ideas and confusion. The patient is nervous/anxious.        Objective:   Physical Exam  Constitutional: She appears well-developed. No distress.       thin  HENT:  Head: Normocephalic.  Right Ear: External ear normal.  Left Ear: External ear normal.  Nose: Nose normal.  Mouth/Throat: Oropharynx is clear and moist.  Eyes: Conjunctivae are normal. Pupils are equal, round, and reactive to light. Right eye exhibits no discharge. Left eye exhibits no discharge.  Neck: Normal range of motion. Neck supple. No JVD present. No tracheal deviation present. No thyromegaly present.  Cardiovascular: Normal rate, regular rhythm and normal heart sounds.     Pulmonary/Chest: No stridor. No respiratory distress. She has no wheezes.  Abdominal: Soft. Bowel sounds are normal. She exhibits no distension and no mass. There is no tenderness. There is no rebound and no guarding.  Musculoskeletal: She exhibits no edema and no tenderness.  Lymphadenopathy:    She has no cervical adenopathy.  Neurological: She displays normal reflexes. No cranial nerve deficit. She exhibits normal muscle tone. Coordination normal.  Skin: No rash noted. No erythema.  Psychiatric: Her behavior is normal. Judgment and thought content normal.       sad   Lab Results  Component Value Date   WBC 5.5 07/15/2011   HGB 13.2 07/15/2011   HCT 38.5 07/15/2011   PLT 154.0 07/15/2011   GLUCOSE 100* 08/06/2011   CHOL 193 07/15/2011   TRIG 124.0 07/15/2011   HDL 59.70 07/15/2011   LDLDIRECT 125.9 10/06/2007   LDLCALC 109* 07/15/2011   ALT 30 08/06/2011   AST 43* 08/06/2011   NA 136 08/06/2011   K 3.7 08/06/2011   CL 101 08/06/2011   CREATININE 0.8 08/06/2011   BUN 14 08/06/2011   CO2 27 08/06/2011   TSH 1.16 11/13/2010          Assessment & Plan:

## 2012-02-25 NOTE — Assessment & Plan Note (Signed)
Better  

## 2012-03-02 ENCOUNTER — Encounter (INDEPENDENT_AMBULATORY_CARE_PROVIDER_SITE_OTHER): Payer: Self-pay | Admitting: General Surgery

## 2012-03-02 ENCOUNTER — Ambulatory Visit (INDEPENDENT_AMBULATORY_CARE_PROVIDER_SITE_OTHER): Payer: Medicare Other | Admitting: General Surgery

## 2012-03-02 VITALS — BP 134/76 | HR 68 | Temp 98.2°F | Resp 14 | Ht 62.0 in | Wt 112.1 lb

## 2012-03-02 DIAGNOSIS — R519 Headache, unspecified: Secondary | ICD-10-CM | POA: Insufficient documentation

## 2012-03-02 DIAGNOSIS — R51 Headache: Secondary | ICD-10-CM

## 2012-03-02 DIAGNOSIS — Z853 Personal history of malignant neoplasm of breast: Secondary | ICD-10-CM | POA: Diagnosis not present

## 2012-03-02 NOTE — Progress Notes (Signed)
Subjective:     Patient ID: Michele Diaz, female   DOB: 24-Sep-1931, 78 y.o.   MRN: [id number]  HPI The patient is a 78 year old white female who is about 6 years out from a right lumpectomy and negative sentinel node biopsy for a T1 C. N0 right breast cancer. Since her last visit she notes that her husband died in 2023-10-12. She also complains of headaches that have been going on for some time. She does have a history of arteritis. She denies any breast pain. She denies any discharge from her nipple on either side.  Review of Systems  Constitutional: Negative.   HENT: Positive for nosebleeds.   Eyes: Negative.   Respiratory: Negative.   Cardiovascular: Negative.   Gastrointestinal: Negative.   Genitourinary: Negative.   Musculoskeletal: Negative.   Skin: Negative.   Neurological: Positive for headaches.  Hematological: Negative.   Psychiatric/Behavioral: Negative.        Objective:   Physical Exam  Constitutional: She is oriented to person, place, and time. She appears well-developed and well-nourished.  HENT:  Head: Normocephalic and atraumatic.  Eyes: Conjunctivae and EOM are normal. Pupils are equal, round, and reactive to light.  Neck: Normal range of motion. Neck supple.  Cardiovascular: Normal rate, regular rhythm and normal heart sounds.   Pulmonary/Chest: Effort normal and breath sounds normal.       She has some palpable fullness of the right breast secondary to radiation but no palpable mass in either breast. No palpable axillary supraclavicular or cervical lymphadenopathy. She does have a little nodularity around a left breast incision laterally. This is stable  Abdominal: Soft. Bowel sounds are normal. She exhibits no mass. There is no tenderness.  Musculoskeletal: Normal range of motion.  Lymphadenopathy:    She has no cervical adenopathy.  Neurological: She is alert and oriented to person, place, and time.  Skin: Skin is warm and dry.  Psychiatric: She has a normal  mood and affect. Her behavior is normal.       Assessment:     6 years status post right lumpectomy and negative sentinel node biopsy. She now complains of headaches.    Plan:     At this point to evaluate her headaches we will begin with a CT of her brain. If this is negative I have encouraged her to notify her medical doctor of her headaches. Otherwise will plan to see her back in a year. She will continue to do regular self exam

## 2012-03-02 NOTE — Patient Instructions (Signed)
Continue regular self exams Let medical doctor know about headache

## 2012-03-04 ENCOUNTER — Ambulatory Visit: Admission: RE | Admit: 2012-03-04 | Payer: Medicare Other | Source: Ambulatory Visit

## 2012-03-04 ENCOUNTER — Ambulatory Visit
Admission: RE | Admit: 2012-03-04 | Discharge: 2012-03-04 | Disposition: A | Discharge status: Home / Self Care | Payer: Medicare Other | Source: Ambulatory Visit | Attending: General Surgery | Admitting: General Surgery

## 2012-03-04 ENCOUNTER — Other Ambulatory Visit: Payer: Medicare Other

## 2012-03-04 ENCOUNTER — Telehealth (INDEPENDENT_AMBULATORY_CARE_PROVIDER_SITE_OTHER): Payer: Self-pay

## 2012-03-04 ENCOUNTER — Other Ambulatory Visit (INDEPENDENT_AMBULATORY_CARE_PROVIDER_SITE_OTHER): Payer: Self-pay | Admitting: General Surgery

## 2012-03-04 DIAGNOSIS — R51 Headache: Secondary | ICD-10-CM

## 2012-03-04 NOTE — Telephone Encounter (Signed)
Patient calling for CT results, Dr. Carolynne Edouard has reviewed and noted results to be negative.

## 2012-03-04 NOTE — Telephone Encounter (Signed)
LMOM for pt to call me back so I can give her the CT results per Dr Carolynne Edouard.

## 2012-06-01 ENCOUNTER — Ambulatory Visit (INDEPENDENT_AMBULATORY_CARE_PROVIDER_SITE_OTHER): Payer: Medicare Other | Admitting: Internal Medicine

## 2012-06-01 ENCOUNTER — Other Ambulatory Visit (INDEPENDENT_AMBULATORY_CARE_PROVIDER_SITE_OTHER): Payer: Medicare Other

## 2012-06-01 ENCOUNTER — Encounter: Payer: Self-pay | Admitting: Internal Medicine

## 2012-06-01 VITALS — BP 130/70 | HR 76 | Temp 97.0°F | Resp 16 | Wt 112.0 lb

## 2012-06-01 DIAGNOSIS — E785 Hyperlipidemia, unspecified: Secondary | ICD-10-CM

## 2012-06-01 DIAGNOSIS — F341 Dysthymic disorder: Secondary | ICD-10-CM

## 2012-06-01 DIAGNOSIS — R634 Abnormal weight loss: Secondary | ICD-10-CM

## 2012-06-01 DIAGNOSIS — E039 Hypothyroidism, unspecified: Secondary | ICD-10-CM

## 2012-06-01 DIAGNOSIS — Z853 Personal history of malignant neoplasm of breast: Secondary | ICD-10-CM

## 2012-06-01 DIAGNOSIS — Z0000 Encounter for general adult medical examination without abnormal findings: Secondary | ICD-10-CM | POA: Insufficient documentation

## 2012-06-01 DIAGNOSIS — F4321 Adjustment disorder with depressed mood: Secondary | ICD-10-CM | POA: Diagnosis not present

## 2012-06-01 DIAGNOSIS — Z862 Personal history of diseases of the blood and blood-forming organs and certain disorders involving the immune mechanism: Secondary | ICD-10-CM

## 2012-06-01 DIAGNOSIS — M353 Polymyalgia rheumatica: Secondary | ICD-10-CM

## 2012-06-01 LAB — CBC WITH DIFFERENTIAL/PLATELET
Basophils Relative: 0.4 % (ref 0.0–3.0)
Eosinophils Absolute: 0.1 10*3/uL (ref 0.0–0.7)
Hemoglobin: 11.6 g/dL — ABNORMAL LOW (ref 12.0–15.0)
MCHC: 33.6 g/dL (ref 30.0–36.0)
MCV: 93.2 fl (ref 78.0–100.0)
Monocytes Absolute: 0.4 10*3/uL (ref 0.1–1.0)
Neutro Abs: 3.8 10*3/uL (ref 1.4–7.7)
Neutrophils Relative %: 78.4 % — ABNORMAL HIGH (ref 43.0–77.0)
RBC: 3.7 Mil/uL — ABNORMAL LOW (ref 3.87–5.11)

## 2012-06-01 LAB — BASIC METABOLIC PANEL
BUN: 10 mg/dL (ref 6–23)
Creatinine, Ser: 0.7 mg/dL (ref 0.4–1.2)
GFR: 83.32 mL/min (ref >60.00)

## 2012-06-01 LAB — TSH: TSH: 1.87 u[IU]/mL (ref 0.35–5.50)

## 2012-06-01 LAB — URIC ACID: Uric Acid, Serum: 3.9 mg/dL (ref 2.4–7.0)

## 2012-06-01 LAB — HEPATIC FUNCTION PANEL: Total Bilirubin: 0.9 mg/dL (ref 0.3–1.2)

## 2012-06-01 MED ORDER — HYDROCORTISONE ACETATE 25 MG RE SUPP: 25.0000 mg | Freq: Two times a day (BID) | RECTAL | Status: DC

## 2012-06-01 NOTE — Assessment & Plan Note (Addendum)
The patient is here for annual Medicare wellness examination and management of other chronic and acute problems.   The risk factors are reflected in the social history.  The roster of all physicians providing medical care to patient - is listed in the Snapshot section of the chart.  Activities of daily living:  The patient is 100% inedpendent in all ADLs: dressing, toileting, feeding as well as independent mobility  Home safety : The patient has smoke detectors in the home. They wear seatbelts. There is no violence in the home.   There is no risks for hepatitis, STDs or HIV. There is no   history of blood transfusion. They have no travel history to infectious disease endemic areas of the world.  The patient has  seen their dentist in the last 12 month. They have  seen their eye doctor in the last year. They deny  Any major hearing difficulty and have not had audiologic testing in the last year.  They do not  have excessive sun exposure. Discussed the need for sun protection: hats, long sleeves and use of sunscreen if there is significant sun exposure.   Diet: the importance of a healthy diet is discussed. They do have a reasonably healthy  diet.  The patient has a fairly regular exercise program of a mixed nature: walking, yard work, etc.The benefits of regular aerobic exercise were discussed.  Depression screen: there are  of depression- irritability, change in appetite, anhedonia, sadness/tearfullness -- better  Cognitive assessment: the patient manages all their financial and personal affairs and is actively engaged. They could relate day,date,year and events; recalled 3/3 objects at 3 minutes  The following portions of the patient's history were reviewed and updated as appropriate: allergies, current medications, past family history, past medical history,  past surgical history, past social history  and problem list.  Vision, hearing, body mass index were assessed and reviewed.   During  the course of the visit the patient was educated and counseled about appropriate screening and preventive services including : fall prevention , diabetes screening, nutrition counseling, colorectal cancer screening, and recommended immunizations.  She can see Dr Juanda Chance to discuss colonoscopy

## 2012-06-01 NOTE — Assessment & Plan Note (Signed)
Better  

## 2012-06-01 NOTE — Assessment & Plan Note (Signed)
Continue with current prescription therapy as reflected on the Med list.  

## 2012-06-01 NOTE — Assessment & Plan Note (Signed)
9/13 she moved to a town house - better

## 2012-06-01 NOTE — Progress Notes (Signed)
   Subjective:    Patient ID: Michele Diaz, female    DOB: 21-Jun-1932, 78 y.o.   MRN: [id number]  HPI  The patient is here for a wellness exam.  The patient presents for a follow-up of  chronic hypertension, chronic dyslipidemia, memory problems controlled with medicines Her husband died in 01/15/13grieving Family is worried about her wt loss - better  9/13 she moved to a town house  Wt Readings from Last 3 Encounters:  06/01/12 112 lb (50.803 kg)  03/02/12 112 lb 2 oz (50.86 kg)  02/25/12 113 lb (51.256 kg)   BP Readings from Last 3 Encounters:  06/01/12 130/70  03/02/12 134/76  02/25/12 130/82        Review of Systems  Constitutional: Positive for unexpected weight change. Negative for fever, chills, activity change, appetite change and fatigue.  HENT: Negative for congestion, mouth sores and sinus pressure.   Eyes: Negative for visual disturbance.  Respiratory: Negative for cough and chest tightness.   Gastrointestinal: Negative for nausea and abdominal pain.  Genitourinary: Negative for frequency, difficulty urinating and vaginal pain.  Musculoskeletal: Negative for back pain and gait problem.  Skin: Negative for pallor and rash.  Neurological: Negative for dizziness, tremors, weakness, numbness and headaches.  Psychiatric/Behavioral: Positive for disturbed wake/sleep cycle. Negative for suicidal ideas and confusion. The patient is nervous/anxious.        Objective:   Physical Exam  Constitutional: She appears well-developed. No distress.       thin  HENT:  Head: Normocephalic.  Right Ear: External ear normal.  Left Ear: External ear normal.  Nose: Nose normal.  Mouth/Throat: Oropharynx is clear and moist.  Eyes: Conjunctivae normal are normal. Pupils are equal, round, and reactive to light. Right eye exhibits no discharge. Left eye exhibits no discharge.  Neck: Normal range of motion. Neck supple. No JVD present. No tracheal deviation present. No  thyromegaly present.  Cardiovascular: Normal rate, regular rhythm and normal heart sounds.   Pulmonary/Chest: No stridor. No respiratory distress. She has no wheezes.  Abdominal: Soft. Bowel sounds are normal. She exhibits no distension and no mass. There is no tenderness. There is no rebound and no guarding.  Musculoskeletal: She exhibits no edema and no tenderness.  Lymphadenopathy:    She has no cervical adenopathy.  Neurological: She displays normal reflexes. No cranial nerve deficit. She exhibits normal muscle tone. Coordination normal.  Skin: No rash noted. No erythema.  Psychiatric: Her behavior is normal. Judgment and thought content normal.       sad   Lab Results  Component Value Date   WBC 5.5 07/15/2011   HGB 13.2 07/15/2011   HCT 38.5 07/15/2011   PLT 154.0 07/15/2011   GLUCOSE 100* 08/06/2011   CHOL 193 07/15/2011   TRIG 124.0 07/15/2011   HDL 59.70 07/15/2011   LDLDIRECT 125.9 10/06/2007   LDLCALC 109* 07/15/2011   ALT 30 08/06/2011   AST 43* 08/06/2011   NA 136 08/06/2011   K 3.7 08/06/2011   CL 101 08/06/2011   CREATININE 0.8 08/06/2011   BUN 14 08/06/2011   CO2 27 08/06/2011   TSH 1.16 11/13/2010          Assessment & Plan:

## 2012-06-01 NOTE — Assessment & Plan Note (Signed)
Monitoring

## 2012-06-01 NOTE — Assessment & Plan Note (Signed)
Doing ok.

## 2012-06-09 ENCOUNTER — Other Ambulatory Visit: Payer: Self-pay | Admitting: Internal Medicine

## 2012-06-22 ENCOUNTER — Encounter: Payer: Self-pay | Admitting: Internal Medicine

## 2012-06-23 ENCOUNTER — Other Ambulatory Visit: Payer: Self-pay | Admitting: Internal Medicine

## 2012-06-24 NOTE — Telephone Encounter (Signed)
Ok to Rf? 

## 2012-08-31 ENCOUNTER — Encounter: Payer: Self-pay | Admitting: Internal Medicine

## 2012-08-31 ENCOUNTER — Ambulatory Visit (INDEPENDENT_AMBULATORY_CARE_PROVIDER_SITE_OTHER): Payer: Medicare Other | Admitting: Internal Medicine

## 2012-08-31 VITALS — BP 122/88 | HR 80 | Temp 97.8°F | Resp 16 | Wt 109.0 lb

## 2012-08-31 DIAGNOSIS — R634 Abnormal weight loss: Secondary | ICD-10-CM

## 2012-08-31 DIAGNOSIS — R209 Unspecified disturbances of skin sensation: Secondary | ICD-10-CM

## 2012-08-31 DIAGNOSIS — Z23 Encounter for immunization: Secondary | ICD-10-CM

## 2012-08-31 DIAGNOSIS — M353 Polymyalgia rheumatica: Secondary | ICD-10-CM

## 2012-08-31 DIAGNOSIS — I10 Essential (primary) hypertension: Secondary | ICD-10-CM

## 2012-08-31 DIAGNOSIS — R202 Paresthesia of skin: Secondary | ICD-10-CM

## 2012-08-31 NOTE — Assessment & Plan Note (Signed)
Wt Readings from Last 3 Encounters:  08/31/12 109 lb (49.442 kg)  06/01/12 112 lb (50.803 kg)  03/02/12 112 lb 2 oz (50.86 kg)   Discussed

## 2012-08-31 NOTE — Progress Notes (Signed)
   Subjective:    Patient ID: Michele Diaz, female    DOB: 15-Nov-1931, 78 y.o.   MRN: [id number]  HPI  The patient presents for a follow-up of  chronic hypertension, chronic dyslipidemia, memory problems controlled with medicines Her husband died in 12-18-12grieving Family is worried about her wt loss - better  9/13 she moved to a town house  Wt Readings from Last 3 Encounters:  08/31/12 109 lb (49.442 kg)  06/01/12 112 lb (50.803 kg)  03/02/12 112 lb 2 oz (50.86 kg)   BP Readings from Last 3 Encounters:  08/31/12 122/88  06/01/12 130/70  03/02/12 134/76        Review of Systems  Constitutional: Positive for unexpected weight change. Negative for fever, chills, activity change, appetite change and fatigue.  HENT: Negative for congestion, mouth sores and sinus pressure.   Eyes: Negative for visual disturbance.  Respiratory: Negative for cough and chest tightness.   Gastrointestinal: Negative for nausea and abdominal pain.  Genitourinary: Negative for frequency, difficulty urinating and vaginal pain.  Musculoskeletal: Negative for back pain and gait problem.  Skin: Negative for pallor and rash.  Neurological: Negative for dizziness, tremors, weakness, numbness and headaches.  Psychiatric/Behavioral: Positive for sleep disturbance. Negative for suicidal ideas and confusion. The patient is nervous/anxious.        Objective:   Physical Exam  Constitutional: She appears well-developed. No distress.       thin  HENT:  Head: Normocephalic.  Right Ear: External ear normal.  Left Ear: External ear normal.  Nose: Nose normal.  Mouth/Throat: Oropharynx is clear and moist.  Eyes: Conjunctivae normal are normal. Pupils are equal, round, and reactive to light. Right eye exhibits no discharge. Left eye exhibits no discharge.  Neck: Normal range of motion. Neck supple. No JVD present. No tracheal deviation present. No thyromegaly present.  Cardiovascular: Normal rate, regular  rhythm and normal heart sounds.   Pulmonary/Chest: No stridor. No respiratory distress. She has no wheezes.  Abdominal: Soft. Bowel sounds are normal. She exhibits no distension and no mass. There is no tenderness. There is no rebound and no guarding.  Musculoskeletal: She exhibits no edema and no tenderness.  Lymphadenopathy:    She has no cervical adenopathy.  Neurological: She displays normal reflexes. No cranial nerve deficit. She exhibits normal muscle tone. Coordination normal.  Skin: No rash noted. No erythema.  Psychiatric: Her behavior is normal. Judgment and thought content normal.       sad   Lab Results  Component Value Date   WBC 4.8 06/01/2012   HGB 11.6* 06/01/2012   HCT 34.4* 06/01/2012   PLT 124.0* 06/01/2012   GLUCOSE 107* 06/01/2012   CHOL 193 07/15/2011   TRIG 124.0 07/15/2011   HDL 59.70 07/15/2011   LDLDIRECT 125.9 10/06/2007   LDLCALC 109* 07/15/2011   ALT 24 06/01/2012   AST 51* 06/01/2012   NA 136 06/01/2012   K 3.9 06/01/2012   CL 102 06/01/2012   CREATININE 0.7 06/01/2012   BUN 10 06/01/2012   CO2 28 06/01/2012   TSH 1.87 06/01/2012          Assessment & Plan:

## 2012-08-31 NOTE — Assessment & Plan Note (Signed)
Continue with current prescription therapy as reflected on the Med list.  

## 2012-08-31 NOTE — Assessment & Plan Note (Signed)
Doing well 

## 2012-10-07 DIAGNOSIS — Z853 Personal history of malignant neoplasm of breast: Secondary | ICD-10-CM | POA: Diagnosis not present

## 2012-10-09 ENCOUNTER — Other Ambulatory Visit: Payer: Self-pay | Admitting: Internal Medicine

## 2012-10-19 ENCOUNTER — Encounter: Payer: Self-pay | Admitting: Internal Medicine

## 2012-11-03 ENCOUNTER — Other Ambulatory Visit: Payer: Self-pay | Admitting: Internal Medicine

## 2012-11-29 ENCOUNTER — Ambulatory Visit: Payer: Medicare Other | Admitting: Internal Medicine

## 2012-12-02 ENCOUNTER — Ambulatory Visit (INDEPENDENT_AMBULATORY_CARE_PROVIDER_SITE_OTHER): Payer: Medicare Other | Admitting: Internal Medicine

## 2012-12-02 ENCOUNTER — Encounter: Payer: Self-pay | Admitting: Internal Medicine

## 2012-12-02 ENCOUNTER — Telehealth: Payer: Self-pay | Admitting: Internal Medicine

## 2012-12-02 ENCOUNTER — Other Ambulatory Visit (INDEPENDENT_AMBULATORY_CARE_PROVIDER_SITE_OTHER): Payer: Medicare Other

## 2012-12-02 VITALS — BP 132/80 | HR 61 | Temp 97.8°F | Wt 111.1 lb

## 2012-12-02 DIAGNOSIS — K219 Gastro-esophageal reflux disease without esophagitis: Secondary | ICD-10-CM

## 2012-12-02 DIAGNOSIS — F341 Dysthymic disorder: Secondary | ICD-10-CM

## 2012-12-02 DIAGNOSIS — R634 Abnormal weight loss: Secondary | ICD-10-CM | POA: Diagnosis not present

## 2012-12-02 DIAGNOSIS — R209 Unspecified disturbances of skin sensation: Secondary | ICD-10-CM

## 2012-12-02 DIAGNOSIS — Z853 Personal history of malignant neoplasm of breast: Secondary | ICD-10-CM | POA: Diagnosis not present

## 2012-12-02 DIAGNOSIS — I10 Essential (primary) hypertension: Secondary | ICD-10-CM

## 2012-12-02 DIAGNOSIS — R5381 Other malaise: Secondary | ICD-10-CM | POA: Diagnosis not present

## 2012-12-02 DIAGNOSIS — E039 Hypothyroidism, unspecified: Secondary | ICD-10-CM

## 2012-12-02 DIAGNOSIS — Z23 Encounter for immunization: Secondary | ICD-10-CM | POA: Diagnosis not present

## 2012-12-02 DIAGNOSIS — R202 Paresthesia of skin: Secondary | ICD-10-CM

## 2012-12-02 DIAGNOSIS — R5383 Other fatigue: Secondary | ICD-10-CM | POA: Diagnosis not present

## 2012-12-02 DIAGNOSIS — M353 Polymyalgia rheumatica: Secondary | ICD-10-CM

## 2012-12-02 LAB — BASIC METABOLIC PANEL
Chloride: 98 mEq/L (ref 96–112)
Potassium: 4.5 mEq/L (ref 3.5–5.1)
Sodium: 136 mEq/L (ref 135–145)

## 2012-12-02 LAB — TSH: TSH: 6.47 u[IU]/mL — ABNORMAL HIGH (ref 0.35–5.50)

## 2012-12-02 LAB — VITAMIN B12: Vitamin B-12: 356 pg/mL (ref 211–911)

## 2012-12-02 MED ORDER — PREDNISOLONE 5 MG PO TABS: 5.0000 mg | ORAL_TABLET | Freq: Every day | ORAL | Status: DC

## 2012-12-02 NOTE — Assessment & Plan Note (Signed)
Continue with current prescription therapy as reflected on the Med list.  

## 2012-12-02 NOTE — Progress Notes (Signed)
   Subjective:    HPI  The patient presents for a follow-up of  chronic hypertension, chronic dyslipidemia, memory problems controlled with medicines Her husband died in 2013-01-09grieving Family is worried about her wt loss - better C/o PMR sx's returned 9/13 she moved to a town house   Wt Readings from Last 3 Encounters:  12/02/12 111 lb 1.9 oz (50.404 kg)  08/31/12 109 lb (49.442 kg)  06/01/12 112 lb (50.803 kg)   BP Readings from Last 3 Encounters:  12/02/12 132/80  08/31/12 122/88  06/01/12 130/70     Review of Systems  Constitutional: Positive for unexpected weight change. Negative for fever, chills, activity change, appetite change and fatigue.  HENT: Negative for congestion, mouth sores and sinus pressure.   Eyes: Negative for visual disturbance.  Respiratory: Negative for cough and chest tightness.   Gastrointestinal: Negative for nausea and abdominal pain.  Genitourinary: Negative for frequency, difficulty urinating and vaginal pain.  Musculoskeletal: Negative for back pain and gait problem.  Skin: Negative for pallor and rash.  Neurological: Negative for dizziness, tremors, weakness, numbness and headaches.  Psychiatric/Behavioral: Positive for sleep disturbance. Negative for suicidal ideas and confusion. The patient is nervous/anxious.        Objective:   Physical Exam  Constitutional: She appears well-developed. No distress.  thin  HENT:  Head: Normocephalic.  Right Ear: External ear normal.  Left Ear: External ear normal.  Nose: Nose normal.  Mouth/Throat: Oropharynx is clear and moist.  Eyes: Conjunctivae are normal. Pupils are equal, round, and reactive to light. Right eye exhibits no discharge. Left eye exhibits no discharge.  Neck: Normal range of motion. Neck supple. No JVD present. No tracheal deviation present. No thyromegaly present.  Cardiovascular: Normal rate, regular rhythm and normal heart sounds.   Pulmonary/Chest: No stridor. No  respiratory distress. She has no wheezes.  Abdominal: Soft. Bowel sounds are normal. She exhibits no distension and no mass. There is no tenderness. There is no rebound and no guarding.  Musculoskeletal: She exhibits no edema and no tenderness.  Lymphadenopathy:    She has no cervical adenopathy.  Neurological: She displays normal reflexes. No cranial nerve deficit. She exhibits normal muscle tone. Coordination normal.  Skin: No rash noted. No erythema.  Psychiatric: Her behavior is normal. Judgment and thought content normal.  sad   Lab Results  Component Value Date   WBC 4.8 06/01/2012   HGB 11.6* 06/01/2012   HCT 34.4* 06/01/2012   PLT 124.0* 06/01/2012   GLUCOSE 107* 06/01/2012   CHOL 193 07/15/2011   TRIG 124.0 07/15/2011   HDL 59.70 07/15/2011   LDLDIRECT 125.9 10/06/2007   LDLCALC 109* 07/15/2011   ALT 24 06/01/2012   AST 51* 06/01/2012   NA 136 06/01/2012   K 3.9 06/01/2012   CL 102 06/01/2012   CREATININE 0.7 06/01/2012   BUN 10 06/01/2012   CO2 28 06/01/2012   TSH 1.87 06/01/2012          Assessment & Plan:

## 2012-12-02 NOTE — Telephone Encounter (Signed)
Absolutely, yes it can - it should cost around $10-15  Thx

## 2012-12-02 NOTE — Assessment & Plan Note (Signed)
Continue with current prescription therapy as reflected on the Med list. Labs  

## 2012-12-02 NOTE — Assessment & Plan Note (Signed)
Better  

## 2012-12-02 NOTE — Telephone Encounter (Signed)
The pharmacy called to say the Prednisolone only comes in brand name and is $300.00.  She wants to know if prednisone can be substituted.

## 2012-12-02 NOTE — Assessment & Plan Note (Signed)
Will start low dose Prednisone

## 2012-12-02 NOTE — Assessment & Plan Note (Addendum)
She as released by Oncology Mammo q 12 mo

## 2012-12-03 NOTE — Telephone Encounter (Signed)
Please call this in to the pharmacy

## 2012-12-03 NOTE — Telephone Encounter (Signed)
Im sorry, what am I calling in?

## 2012-12-05 NOTE — Telephone Encounter (Signed)
Prednisone - generic Thx

## 2012-12-06 ENCOUNTER — Other Ambulatory Visit: Payer: Self-pay | Admitting: Internal Medicine

## 2012-12-06 DIAGNOSIS — E039 Hypothyroidism, unspecified: Secondary | ICD-10-CM

## 2012-12-06 DIAGNOSIS — M353 Polymyalgia rheumatica: Secondary | ICD-10-CM

## 2012-12-06 NOTE — Telephone Encounter (Signed)
Michele Diaz, please, inform patient that all labs are normal except for abn TSH and ESR Start Prednisone Increase dose of Synthroid to 88 mcg/d TSH and ESR in 6 wks Thx

## 2012-12-06 NOTE — Telephone Encounter (Signed)
Called pt no answer unable to leave message. Will try again later.

## 2012-12-08 MED ORDER — LEVOTHYROXINE SODIUM 88 MCG PO TABS: 88.0000 ug | ORAL_TABLET | Freq: Every day | ORAL | Status: DC

## 2012-12-08 NOTE — Telephone Encounter (Signed)
Pharmacy informed.

## 2012-12-08 NOTE — Telephone Encounter (Signed)
Called pt no answer unable to leave message

## 2012-12-14 ENCOUNTER — Other Ambulatory Visit: Payer: Self-pay | Admitting: Internal Medicine

## 2012-12-24 NOTE — Telephone Encounter (Signed)
Pt informed

## 2012-12-28 ENCOUNTER — Encounter (INDEPENDENT_AMBULATORY_CARE_PROVIDER_SITE_OTHER): Payer: Self-pay | Admitting: General Surgery

## 2013-01-07 ENCOUNTER — Encounter (HOSPITAL_COMMUNITY): Payer: Self-pay | Admitting: General Practice

## 2013-01-07 ENCOUNTER — Inpatient Hospital Stay (HOSPITAL_COMMUNITY)
Admission: EM | Admit: 2013-01-07 | Discharge: 2013-01-10 | DRG: 065 | Disposition: A | Discharge status: Rehabilitation Facility | Payer: Medicare Other | Attending: Internal Medicine | Admitting: Internal Medicine

## 2013-01-07 ENCOUNTER — Inpatient Hospital Stay (HOSPITAL_COMMUNITY): Payer: Medicare Other

## 2013-01-07 ENCOUNTER — Emergency Department (HOSPITAL_COMMUNITY): Payer: Medicare Other

## 2013-01-07 ENCOUNTER — Telehealth: Payer: Self-pay | Admitting: Internal Medicine

## 2013-01-07 DIAGNOSIS — I672 Cerebral atherosclerosis: Secondary | ICD-10-CM | POA: Diagnosis not present

## 2013-01-07 DIAGNOSIS — E785 Hyperlipidemia, unspecified: Secondary | ICD-10-CM | POA: Diagnosis present

## 2013-01-07 DIAGNOSIS — Z79899 Other long term (current) drug therapy: Secondary | ICD-10-CM | POA: Diagnosis not present

## 2013-01-07 DIAGNOSIS — Z853 Personal history of malignant neoplasm of breast: Secondary | ICD-10-CM | POA: Diagnosis not present

## 2013-01-07 DIAGNOSIS — E559 Vitamin D deficiency, unspecified: Secondary | ICD-10-CM | POA: Diagnosis present

## 2013-01-07 DIAGNOSIS — F341 Dysthymic disorder: Secondary | ICD-10-CM | POA: Diagnosis not present

## 2013-01-07 DIAGNOSIS — IMO0002 Reserved for concepts with insufficient information to code with codable children: Secondary | ICD-10-CM

## 2013-01-07 DIAGNOSIS — A498 Other bacterial infections of unspecified site: Secondary | ICD-10-CM | POA: Diagnosis present

## 2013-01-07 DIAGNOSIS — R29898 Other symptoms and signs involving the musculoskeletal system: Secondary | ICD-10-CM | POA: Diagnosis present

## 2013-01-07 DIAGNOSIS — K5730 Diverticulosis of large intestine without perforation or abscess without bleeding: Secondary | ICD-10-CM

## 2013-01-07 DIAGNOSIS — N390 Urinary tract infection, site not specified: Secondary | ICD-10-CM | POA: Diagnosis present

## 2013-01-07 DIAGNOSIS — M069 Rheumatoid arthritis, unspecified: Secondary | ICD-10-CM | POA: Diagnosis present

## 2013-01-07 DIAGNOSIS — F3289 Other specified depressive episodes: Secondary | ICD-10-CM | POA: Diagnosis present

## 2013-01-07 DIAGNOSIS — R918 Other nonspecific abnormal finding of lung field: Secondary | ICD-10-CM | POA: Diagnosis not present

## 2013-01-07 DIAGNOSIS — M353 Polymyalgia rheumatica: Secondary | ICD-10-CM

## 2013-01-07 DIAGNOSIS — J45909 Unspecified asthma, uncomplicated: Secondary | ICD-10-CM | POA: Diagnosis present

## 2013-01-07 DIAGNOSIS — I10 Essential (primary) hypertension: Secondary | ICD-10-CM | POA: Diagnosis not present

## 2013-01-07 DIAGNOSIS — Z923 Personal history of irradiation: Secondary | ICD-10-CM | POA: Diagnosis not present

## 2013-01-07 DIAGNOSIS — R079 Chest pain, unspecified: Secondary | ICD-10-CM | POA: Diagnosis not present

## 2013-01-07 DIAGNOSIS — I6350 Cerebral infarction due to unspecified occlusion or stenosis of unspecified cerebral artery: Secondary | ICD-10-CM | POA: Diagnosis not present

## 2013-01-07 DIAGNOSIS — Z833 Family history of diabetes mellitus: Secondary | ICD-10-CM | POA: Diagnosis not present

## 2013-01-07 DIAGNOSIS — F329 Major depressive disorder, single episode, unspecified: Secondary | ICD-10-CM | POA: Diagnosis present

## 2013-01-07 DIAGNOSIS — F411 Generalized anxiety disorder: Secondary | ICD-10-CM | POA: Diagnosis present

## 2013-01-07 DIAGNOSIS — K219 Gastro-esophageal reflux disease without esophagitis: Secondary | ICD-10-CM

## 2013-01-07 DIAGNOSIS — E039 Hypothyroidism, unspecified: Secondary | ICD-10-CM | POA: Diagnosis present

## 2013-01-07 DIAGNOSIS — R5381 Other malaise: Secondary | ICD-10-CM | POA: Diagnosis not present

## 2013-01-07 DIAGNOSIS — I639 Cerebral infarction, unspecified: Secondary | ICD-10-CM

## 2013-01-07 DIAGNOSIS — M5030 Other cervical disc degeneration, unspecified cervical region: Secondary | ICD-10-CM | POA: Diagnosis not present

## 2013-01-07 DIAGNOSIS — R071 Chest pain on breathing: Secondary | ICD-10-CM

## 2013-01-07 DIAGNOSIS — Z8249 Family history of ischemic heart disease and other diseases of the circulatory system: Secondary | ICD-10-CM

## 2013-01-07 DIAGNOSIS — R42 Dizziness and giddiness: Secondary | ICD-10-CM

## 2013-01-07 DIAGNOSIS — I6330 Cerebral infarction due to thrombosis of unspecified cerebral artery: Secondary | ICD-10-CM | POA: Diagnosis not present

## 2013-01-07 DIAGNOSIS — M5020 Other cervical disc displacement, unspecified cervical region: Secondary | ICD-10-CM | POA: Diagnosis not present

## 2013-01-07 DIAGNOSIS — G9389 Other specified disorders of brain: Secondary | ICD-10-CM | POA: Diagnosis not present

## 2013-01-07 DIAGNOSIS — R404 Transient alteration of awareness: Secondary | ICD-10-CM | POA: Diagnosis not present

## 2013-01-07 HISTORY — DX: Low back pain, unspecified: M54.50

## 2013-01-07 HISTORY — DX: Other giant cell arteritis: M31.6

## 2013-01-07 HISTORY — DX: Headache, unspecified: R51.9

## 2013-01-07 HISTORY — DX: Anxiety disorder, unspecified: F41.9

## 2013-01-07 HISTORY — DX: Other chronic pain: G89.29

## 2013-01-07 HISTORY — DX: Headache: R51

## 2013-01-07 HISTORY — DX: Fibromyalgia: M79.7

## 2013-01-07 HISTORY — DX: Malignant neoplasm of unspecified site of unspecified female breast: C50.919

## 2013-01-07 HISTORY — DX: Low back pain: M54.5

## 2013-01-07 LAB — URINALYSIS, ROUTINE W REFLEX MICROSCOPIC
Bilirubin Urine: NEGATIVE
Glucose, UA: NEGATIVE mg/dL
Hgb urine dipstick: NEGATIVE
Ketones, ur: NEGATIVE mg/dL
Nitrite: POSITIVE — AB
Protein, ur: NEGATIVE mg/dL
Specific Gravity, Urine: 1.012 (ref 1.005–1.030)
Urobilinogen, UA: 0.2 mg/dL (ref 0.0–1.0)
pH: 6 (ref 5.0–8.0)

## 2013-01-07 LAB — COMPREHENSIVE METABOLIC PANEL
Alkaline Phosphatase: 78 U/L (ref 39–117)
BUN: 12 mg/dL (ref 6–23)
CO2: 30 mEq/L (ref 19–32)
GFR calc Af Amer: >90 mL/min (ref >90)
GFR calc non Af Amer: 81 mL/min — ABNORMAL LOW (ref >90)
Glucose, Bld: 90 mg/dL (ref 70–99)
Potassium: 3.5 mEq/L (ref 3.5–5.1)
Total Bilirubin: 0.9 mg/dL (ref 0.3–1.2)
Total Protein: 8.1 g/dL (ref 6.0–8.3)

## 2013-01-07 LAB — CBC
Hemoglobin: 11.9 g/dL — ABNORMAL LOW (ref 12.0–15.0)
MCH: 30.5 pg (ref 26.0–34.0)
MCV: 87.9 fL (ref 78.0–100.0)
RBC: 3.9 MIL/uL (ref 3.87–5.11)

## 2013-01-07 LAB — RAPID URINE DRUG SCREEN, HOSP PERFORMED
Amphetamines: NOT DETECTED
Barbiturates: NOT DETECTED
Benzodiazepines: NOT DETECTED
Cocaine: NOT DETECTED
Opiates: NOT DETECTED
Tetrahydrocannabinol: NOT DETECTED

## 2013-01-07 LAB — POCT I-STAT TROPONIN I: Troponin i, poc: 0 ng/mL (ref 0.00–0.08)

## 2013-01-07 LAB — DIFFERENTIAL
Eosinophils Absolute: 0.2 10*3/uL (ref 0.0–0.7)
Lymphocytes Relative: 16 % (ref 12–46)
Lymphs Abs: 0.7 10*3/uL (ref 0.7–4.0)
Monocytes Relative: 9 % (ref 3–12)
Neutrophils Relative %: 72 % (ref 43–77)

## 2013-01-07 LAB — POCT I-STAT, CHEM 8
BUN: 12 mg/dL (ref 6–23)
Chloride: 101 mEq/L (ref 96–112)
HCT: 36 % (ref 36.0–46.0)
Sodium: 142 mEq/L (ref 135–145)

## 2013-01-07 LAB — TROPONIN I: Troponin I: <0.3 ng/mL

## 2013-01-07 LAB — GLUCOSE, CAPILLARY: Glucose-Capillary: 102 mg/dL — ABNORMAL HIGH (ref 70–99)

## 2013-01-07 LAB — URINE MICROSCOPIC-ADD ON

## 2013-01-07 LAB — PROTIME-INR: INR: 1.01 (ref 0.00–1.49)

## 2013-01-07 LAB — APTT: aPTT: 30 s (ref 24–37)

## 2013-01-07 MED ORDER — ONDANSETRON HCL 4 MG/2ML IJ SOLN: 4.0000 mg | Freq: Three times a day (TID) | INTRAMUSCULAR | Status: AC | PRN

## 2013-01-07 MED ORDER — SODIUM CHLORIDE 0.9 % IV SOLN
INTRAVENOUS | Status: AC
  Administered 2013-01-07: 100 mL/h via INTRAVENOUS

## 2013-01-07 MED ORDER — ASPIRIN 325 MG PO TABS
325.0000 mg | ORAL_TABLET | Freq: Every day | ORAL | Status: DC
  Administered 2013-01-08: 325 mg via ORAL
  Filled 2013-01-07 (×2): qty 1

## 2013-01-07 MED ORDER — VITAMIN D3 25 MCG (1000 UNIT) PO TABS
1000.0000 [IU] | ORAL_TABLET | Freq: Every day | ORAL | Status: DC
  Administered 2013-01-08 – 2013-01-10 (×3): 1000 [IU] via ORAL
  Filled 2013-01-07 (×3): qty 1

## 2013-01-07 MED ORDER — ASPIRIN 325 MG PO TABS
325.0000 mg | ORAL_TABLET | Freq: Once | ORAL | Status: AC
  Administered 2013-01-07: 325 mg via ORAL

## 2013-01-07 MED ORDER — MIRTAZAPINE 7.5 MG PO TABS
7.5000 mg | ORAL_TABLET | Freq: Every day | ORAL | Status: DC
  Administered 2013-01-07 – 2013-01-09 (×3): 7.5 mg via ORAL
  Filled 2013-01-07 (×4): qty 1

## 2013-01-07 MED ORDER — LEVOTHYROXINE SODIUM 88 MCG PO TABS
88.0000 ug | ORAL_TABLET | Freq: Every day | ORAL | Status: DC
  Administered 2013-01-08 – 2013-01-10 (×3): 88 ug via ORAL
  Filled 2013-01-07 (×4): qty 1

## 2013-01-07 MED ORDER — PREDNISONE 5 MG PO TABS
5.0000 mg | ORAL_TABLET | Freq: Every day | ORAL | Status: DC
  Administered 2013-01-08 – 2013-01-10 (×3): 5 mg via ORAL
  Filled 2013-01-07 (×4): qty 1

## 2013-01-07 MED ORDER — AZATHIOPRINE 50 MG PO TABS
50.0000 mg | ORAL_TABLET | Freq: Every day | ORAL | Status: DC
  Administered 2013-01-08 – 2013-01-10 (×3): 50 mg via ORAL
  Filled 2013-01-07 (×3): qty 1

## 2013-01-07 MED ORDER — ALPRAZOLAM 0.25 MG PO TABS
0.2500 mg | ORAL_TABLET | Freq: Three times a day (TID) | ORAL | Status: DC | PRN
  Administered 2013-01-07: 0.5 mg via ORAL
  Administered 2013-01-08: 0.25 mg via ORAL
  Filled 2013-01-07: qty 2
  Filled 2013-01-07: qty 1

## 2013-01-07 MED ORDER — ENOXAPARIN SODIUM 40 MG/0.4ML ~~LOC~~ SOLN
40.0000 mg | SUBCUTANEOUS | Status: DC
  Administered 2013-01-07 – 2013-01-09 (×3): 40 mg via SUBCUTANEOUS
  Filled 2013-01-07 (×4): qty 0.4

## 2013-01-07 MED ORDER — SENNOSIDES-DOCUSATE SODIUM 8.6-50 MG PO TABS: 1.0000 | ORAL_TABLET | Freq: Every evening | ORAL | Status: DC | PRN

## 2013-01-07 NOTE — Telephone Encounter (Signed)
Patient Information:  Caller Name: Jonathon  Phone: [phone]  Patient: Michele Diaz, Michele Diaz  Gender: Female  DOB: [birth year]  Age: 79 Years  PCP: [name], Alex (Adults only)  Office Follow Up:  Does the office need to follow up with this patient?: No  Instructions For The Office: N/A  RN Note:  Suggested to call 911 but pt states that she will call her sister and have her take her to [facility] ED now  Symptoms  Reason For Call & Symptoms: Pt is calling and states that 01/06/13 had a blood clot come out of her mouth before she brushed her teeth; reports that it looked like a slot; in the evening on 01/06/13 her right hand was weak or numb; kept dropping fork when trying to eat;  sx are the same today; almost fell last night when tried to go to bathroom  Reviewed Health History In EMR: N/A  Reviewed Medications In EMR: N/A  Reviewed Allergies In EMR: N/A  Reviewed Surgeries / Procedures: N/A  Date of Onset of Symptoms: 01/06/2013  Guideline(s) Used:  Neurologic Deficit  Disposition Per Guideline:   Call EMS 911 Now  Reason For Disposition Reached:   New neurologic deficit that is present NOW, sudden onset of ANY of the following:   Weakness of the face, arm, or leg on one side of the body  Numbness of the face, arm, or leg on one side of the body  Loss of speech or garbled speech  Advice Given:  Call Back If:  You become worse.  Patient Will Follow Care Advice:  YES

## 2013-01-07 NOTE — ED Notes (Signed)
Per report from pt and [facility] pt states that over the last month she has had a increase in dizziness.  Yesterday though she noticed that the dizziness has increased and that she was having R arm weakness to the extent she could not pull up the covers in the bed.  This am she was unable to hold a pen to sign her name.  R arm drift noted, r grip weak.  No facial droop noted.  R leg is strong with no drift.  Speech non slurred.

## 2013-01-07 NOTE — Consult Note (Signed)
Referring Physician: Dhungel    Chief Complaint: right arm weakness  HPI:                                                                                                                                         Michele Diaz is an 79 y.o. female who condones a multitude of different symptoms starting yesterday about noon.  These include, not feeling her feet well, off balance, right nock pain, chronic HA (but was not present yesterday).  She was admitted to hospital after she was concerned about the fact she was not able to pull her credit card out of her wallet or write a check with her right hand yesterday.  She states she was unable to move her right arm last night but this has improved over the last 12 hours--but she still feels her right hand is weak.   Date last known well: 01/08/13 Time last known well: 01/08/13 tPA Given: No: out of window  Past Medical History  Diagnosis Date  . Hypothyroid   . Asthma   . Arthritis   . Hypertension   . GERD (gastroesophageal reflux disease)   . Depression   . Unspecified vitamin D deficiency   . Allergic rhinitis   . Diverticulitis of colon   . Hepatitis, autoimmune   . Herpes zoster 2011  . Weight loss 6/11  . Giant cell arteritis 2003    devashwar  . Hyperlipidemia     statins increased LFTs  . Cancer     BREAST    Past Surgical History  Procedure Laterality Date  . Appendectomy    . Cholecystectomy    . Breast surgery    . Tonsillectomy    . Abdominal hysterectomy  1988    Family History  Problem Relation Age of Onset  . Diabetes Mother   . Cancer Brother   . Heart disease Brother   . Cancer Sister   . Heart disease Sister     MI at age 61  . Hypertension Other   . Heart attack Mother     died of MI at age 7   Social History:  reports that she has never smoked. She has never used smokeless tobacco. She reports that she does not drink alcohol or use illicit drugs.  Allergies:  Allergies  Allergen Reactions  .  Celecoxib Shortness Of Breath    redness  . Codeine Nausea Only  . Atorvastatin     Elevated liver enzymes  . Rosuvastatin     Elevated liver enzymes  . Penicillins Hives    Medications:  Prior to Admission:  Prescriptions prior to admission  Medication Sig Dispense Refill  . ALPRAZolam (XANAX) 0.5 MG tablet Take 0.5-1 tablets (0.25-0.5 mg total) by mouth 3 (three) times daily as needed for sleep or anxiety.  90 tablet  0  . azaTHIOprine (IMURAN) 50 MG tablet take 1 tablet by mouth once daily  90 tablet  3  . carvedilol (COREG) 25 MG tablet take 1 tablet by mouth twice a day  180 tablet  3  . cholecalciferol (VITAMIN D) 1000 UNITS tablet Take 1,000 Units by mouth daily.        . hydrochlorothiazide (MICROZIDE) 12.5 MG capsule take 1 capsule by mouth once daily  30 capsule  11  . levothyroxine (SYNTHROID, LEVOTHROID) 88 MCG tablet Take 1 tablet (88 mcg total) by mouth daily.  30 tablet  11  . mirtazapine (REMERON) 15 MG tablet Take 7.5 mg by mouth at bedtime.      . predniSONE (DELTASONE) 5 MG tablet Take 5 mg by mouth daily.      . hydrochlorothiazide (HYDRODIURIL) 12.5 MG tablet Take 12.5 mg by mouth daily.       Scheduled: . sodium chloride   Intravenous STAT  . [START ON 01/08/2013] aspirin  325 mg Oral Daily  . aspirin  325 mg Oral Once  . [START ON 01/08/2013] azaTHIOprine  50 mg Oral Daily  . [START ON 01/08/2013] cholecalciferol  1,000 Units Oral Daily  . enoxaparin (LOVENOX) injection  40 mg Subcutaneous Q24H  . [START ON 01/08/2013] levothyroxine  88 mcg Oral Q breakfast  . mirtazapine  7.5 mg Oral QHS  . [START ON 01/08/2013] predniSONE  5 mg Oral Q breakfast    ROS:                                                                                                                                       History obtained from the patient  General ROS:  negative for - chills, fatigue, fever, night sweats, weight gain or weight loss Psychological ROS: negative for - behavioral disorder, hallucinations, memory difficulties, mood swings or suicidal ideation Ophthalmic ROS: negative for - blurry vision, double vision, eye pain or loss of vision ENT ROS: negative for - epistaxis, nasal discharge, oral lesions, sore throat, tinnitus or vertigo Allergy and Immunology ROS: negative for - hives or itchy/watery eyes Hematological and Lymphatic ROS: negative for - bleeding problems, bruising or swollen lymph nodes Endocrine ROS: negative for - galactorrhea, hair pattern changes, polydipsia/polyuria or temperature intolerance Respiratory ROS: negative for - cough, hemoptysis, shortness of breath or wheezing Cardiovascular ROS: negative for - chest pain, dyspnea on exertion, edema or irregular heartbeat Gastrointestinal ROS: negative for - abdominal pain, diarrhea, hematemesis, nausea/vomiting or stool incontinence Genito-Urinary ROS: negative for - dysuria, hematuria, incontinence or urinary frequency/urgency Musculoskeletal ROS: negative for - joint swelling or muscular weakness Neurological ROS: as noted in HPI Dermatological ROS: negative  for rash and skin lesion changes  Neurologic Examination:                                                                                                      Blood pressure 149/73, pulse 62, temperature 98.4 F (36.9 C), temperature source Oral, height 5\' 2"  (1.575 m), weight 49.442 kg (109 lb), SpO2 98.00%.   Mental Status: Alert, oriented, thought content appropriate.  Speech fluent without evidence of aphasia.  Able to follow 3 step commands without difficulty. Cranial Nerves: II: Discs flat bilaterally; Visual fields grossly normal, pupils equal, round, reactive to light and accommodation III,IV, VI: ptosis present left eye (chronic), extra-ocular motions intact bilaterally V,VII: smile symmetric, facial light  touch sensation normal bilaterally VIII: hearing normal bilaterally IX,X: gag reflex present XI: bilateral shoulder shrug XII: midline tongue extension Motor: Right : Upper extremity   note    Left:     Upper extremity   5/5  Lower extremity  note    Lower extremity   5/5 --right upper extremity showed slight drift, 4+ strength in the shoulder, and bicep, 3/5 tricep and weak grip--there was a component of give way weakness in proximal muscles and feel her strength is 4+ in bicep and shoulder.  --right LE showed 4/5 strength throughout with effort dependent weakness. When giving full strength 4+-5/5 Tone and bulk:normal tone throughout; no atrophy noted Sensory: Pinprick and light touch intact throughout, bilaterally Deep Tendon Reflexes: 2+ and symmetric throughout except at ankle 1/4 Plantars: Right: downgoing   Left: downgoing Cerebellar: normal finger-to-nose,  normal heel-to-shin test CV: pulses palpable throughout    Results for orders placed during the hospital encounter of 01/07/13 (from the past 48 hour(s))  ETHANOL     Status: None   Collection Time    01/07/13  1:22 PM      Result Value Range   Alcohol, Ethyl (B) <11  0 - 11 mg/dL   Comment:            LOWEST DETECTABLE LIMIT FOR     SERUM ALCOHOL IS 11 mg/dL     FOR MEDICAL PURPOSES ONLY  PROTIME-INR     Status: None   Collection Time    01/07/13  1:22 PM      Result Value Range   Prothrombin Time 13.2  11.6 - 15.2 seconds   INR 1.01  0.00 - 1.49  APTT     Status: None   Collection Time    01/07/13  1:22 PM      Result Value Range   aPTT 30  24 - 37 seconds  CBC     Status: Abnormal   Collection Time    01/07/13  1:22 PM      Result Value Range   WBC 4.6  4.0 - 10.5 K/uL   RBC 3.90  3.87 - 5.11 MIL/uL   Hemoglobin 11.9 (*) 12.0 - 15.0 g/dL   HCT 16.1 (*) 09.6 - 04.5 %   MCV 87.9  78.0 - 100.0 fL   MCH 30.5  26.0 - 34.0  pg   MCHC 34.7  30.0 - 36.0 g/dL   RDW 16.1  09.6 - 04.5 %   Platelets 123 (*) 150 -  400 K/uL  DIFFERENTIAL     Status: None   Collection Time    01/07/13  1:22 PM      Result Value Range   Neutrophils Relative 72  43 - 77 %   Neutro Abs 3.3  1.7 - 7.7 K/uL   Lymphocytes Relative 16  12 - 46 %   Lymphs Abs 0.7  0.7 - 4.0 K/uL   Monocytes Relative 9  3 - 12 %   Monocytes Absolute 0.4  0.1 - 1.0 K/uL   Eosinophils Relative 3  0 - 5 %   Eosinophils Absolute 0.2  0.0 - 0.7 K/uL   Basophils Relative 0  0 - 1 %   Basophils Absolute 0.0  0.0 - 0.1 K/uL  COMPREHENSIVE METABOLIC PANEL     Status: Abnormal   Collection Time    01/07/13  1:22 PM      Result Value Range   Sodium 140  135 - 145 mEq/L   Potassium 3.5  3.5 - 5.1 mEq/L   Chloride 102  96 - 112 mEq/L   CO2 30  19 - 32 mEq/L   Glucose, Bld 90  70 - 99 mg/dL   BUN 12  6 - 23 mg/dL   Creatinine, Ser 4.09  0.50 - 1.10 mg/dL   Calcium 9.7  8.4 - 81.1 mg/dL   Total Protein 8.1  6.0 - 8.3 g/dL   Albumin 3.5  3.5 - 5.2 g/dL   AST 27  0 - 37 U/L   ALT 10  0 - 35 U/L   Alkaline Phosphatase 78  39 - 117 U/L   Total Bilirubin 0.9  0.3 - 1.2 mg/dL   GFR calc non Af Amer 81 (*) >90 mL/min   GFR calc Af Amer >90  >90 mL/min   Comment:            The eGFR has been calculated     using the CKD EPI equation.     This calculation has not been     validated in all clinical     situations.     eGFR's persistently     <90 mL/min signify     possible Chronic Kidney Disease.  TROPONIN I     Status: None   Collection Time    01/07/13  1:29 PM      Result Value Range   Troponin I <0.30  <0.30 ng/mL   Comment:            Due to the release kinetics of cTnI,     a negative result within the first hours     of the onset of symptoms does not rule out     myocardial infarction with certainty.     If myocardial infarction is still suspected,     repeat the test at appropriate intervals.  POCT I-STAT TROPONIN I     Status: None   Collection Time    01/07/13  2:05 PM      Result Value Range   Troponin i, poc 0.00  0.00 -  0.08 ng/mL   Comment 3            Comment: Due to the release kinetics of cTnI,     a negative result within the first hours     of the onset  of symptoms does not rule out     myocardial infarction with certainty.     If myocardial infarction is still suspected,     repeat the test at appropriate intervals.  GLUCOSE, CAPILLARY     Status: Abnormal   Collection Time    01/07/13  2:06 PM      Result Value Range   Glucose-Capillary 102 (*) 70 - 99 mg/dL   Comment 1 Notify RN    POCT I-STAT, CHEM 8     Status: None   Collection Time    01/07/13  2:07 PM      Result Value Range   Sodium 142  135 - 145 mEq/L   Potassium 3.5  3.5 - 5.1 mEq/L   Chloride 101  96 - 112 mEq/L   BUN 12  6 - 23 mg/dL   Creatinine, Ser 1.61  0.50 - 1.10 mg/dL   Glucose, Bld 90  70 - 99 mg/dL   Calcium, Ion 0.96  0.45 - 1.30 mmol/L   TCO2 30  0 - 100 mmol/L   Hemoglobin 12.2  12.0 - 15.0 g/dL   HCT 40.9  81.1 - 91.4 %   Ct Head Wo Contrast  01/07/2013  *RADIOLOGY REPORT*  Clinical Data: Right arm weakness  CT HEAD WITHOUT CONTRAST  Technique:  Contiguous axial images were obtained from the base of the skull through the vertex without contrast.  Comparison: CT head 03/04/2012  Findings: Age appropriate atrophy.  Mild chronic microvascular ischemia in the cerebral white matter, unchanged.  No acute infarct.  Negative for hemorrhage or mass lesion.  Calvarium intact. Mucosal edema in the sphenoid sinus, improved from the prior study.  Small retention cyst left maxillary sinus, unchanged.  IMPRESSION: Chronic microvascular ischemia.  No acute abnormality.   Original Report Authenticated By: Janeece Riggers, M.D.     Assessment and plan discussed with with attending physician and they are in agreement.    Felicie Morn PA-C [facility] [phone]  01/07/2013, 4:45 PM   Assessment: 79 y.o. female with right arm weakness starting > 24 hours ago.  Although CT head was negative for acute infarct cannot exclude  CVA.  Exam shows right arm weakness distal>proximal.  Right leg also showed some weakness but there was a component of give way weakness associated with right leg strength. Would continue with stroke workup to evaluate for CVA not visualized on CT.  Stroke Risk Factors - hyperlipidemia and hypertension  Plan: 1. HgbA1c, fasting lipid panel 2. MRI, MRA  of the brain without contrast 3. PT consult, OT consult, Speech consult 4. Echocardiogram 5. Carotid dopplers 6. Prophylactic therapy-Antiplatelet med: Aspirin - dose 81 mg--patient states she has had gastric problems in past and may need Plavix if GI issues occur.  7. Risk factor modification 8. Telemetry monitoring 9. Frequent neuro checks  Patient seen and examined together with physician assistant and I concur with the assessment and plan.  Wyatt Portela, MD

## 2013-01-07 NOTE — H&P (Signed)
[facility] History and Physical  Michele Diaz ZOX:[id number] DOB: 1931/09/29 DOA: 01/07/2013  Referring physician: ED PCP: [name], MD   Chief Complaint:  Right arm weakness since 2-3 days  HPI:  79 year old female wit history of rheumatoid arthritis, hypertension, GERD, giant cell arteritis, hypothyroidism, hyperlipidemia, history of breast cancer status post lumpectomy and radiation who presented to the ED with symptoms of increasing weakness in her right arm and hand since the last 2-3 days. Patient informs him that for past 6 weeks she has been having vertigo  on standing and ambulating. Since last 4-5 days as he has been having some unsteady gait as well. For last 2 days she has noticed increased to weakness in her right arm and hand with poor finger grasp since yesterday. As the symptoms were persistent she came to the ED today. Patient denies any headache, blurred vision, fever, chills, chest pain, palpitations, shortness of breath, abdominal pain, nausea, vomiting, bowel or urinary symptoms. She denies similar symptoms in the past. She denies any tingling or numbness of her  extremities. In the ED she was noted to have significant weakness in her right arm and hand. Head CT was done which was unremarkable for acute event.[facility]  called for admission for possible stroke. Given duration of symptoms over past 2 days no code stroke was called.    Review of Systems:  Constitutional: Denies fever, chills, diaphoresis, appetite change and fatigue.  HEENT: Denies photophobia, eye pain, redness, hearing loss, ear pain, congestion, sore throat, rhinorrhea, sneezing, mouth sores, trouble swallowing, neck pain, neck stiffness and tinnitus.   Respiratory: Denies SOB, DOE, cough, chest tightness,  and wheezing.   Cardiovascular: Denies chest pain, palpitations and leg swelling.  Gastrointestinal: Denies nausea, vomiting, abdominal pain, diarrhea, constipation, blood in stool  and abdominal distention.  Genitourinary: Denies dysuria, urgency, frequency, hematuria, flank pain and difficulty urinating.  Musculoskeletal: Denies myalgias, back pain, joint swelling, arthralgias and gait problem.  Skin: Denies pallor, rash and wound.  Neurological:  Dizziness+,right UE weakness+.  denies seizures, syncope,  light-headedness, numbness and headaches.  Hematological: Denies adenopathy. Easy bruising, personal or family bleeding history  Psychiatric/Behavioral: Denies suicidal ideation, mood changes, confusion, nervousness, sleep disturbance and agitation   Past Medical History  Diagnosis Date  . Hypothyroid   . Asthma   . Arthritis   . Hypertension   . GERD (gastroesophageal reflux disease)   . Depression   . Unspecified vitamin D deficiency   . Allergic rhinitis   . Diverticulitis of colon   . Hepatitis, autoimmune   . Herpes zoster 2011  . Weight loss 6/11  . Giant cell arteritis 2003    devashwar  . Hyperlipidemia     statins increased LFTs  . Cancer     BREAST   Past Surgical History  Procedure Laterality Date  . Appendectomy    . Cholecystectomy    . Breast surgery    . Tonsillectomy    . Abdominal hysterectomy  1988   Social History:  reports that she has never smoked. She has never used smokeless tobacco. She reports that she does not drink alcohol or use illicit drugs.  Allergies  Allergen Reactions  . Celecoxib Shortness Of Breath    redness  . Codeine Nausea Only  . Atorvastatin     Elevated liver enzymes  . Rosuvastatin     Elevated liver enzymes  . Penicillins Hives    Family History  Problem Relation Age of Onset  .  Diabetes Mother   . Cancer Brother   . Heart disease Brother   . Cancer Sister   . Heart disease Sister     MI at age 7  . Hypertension Other   . Heart attack Mother     died of MI at age 17    Prior to Admission medications   Medication Sig Start Date End Date Taking? Authorizing Provider  ALPRAZolam  Prudy Feeler) 0.5 MG tablet Take 0.5-1 tablets (0.25-0.5 mg total) by mouth 3 (three) times daily as needed for sleep or anxiety. 02/11/12 02/10/13 Yes Newt Lukes, MD  azaTHIOprine (IMURAN) 50 MG tablet take 1 tablet by mouth once daily 10/09/12  Yes Tresa Garter, MD  carvedilol (COREG) 25 MG tablet take 1 tablet by mouth twice a day 10/09/12  Yes Tresa Garter, MD  cholecalciferol (VITAMIN D) 1000 UNITS tablet Take 1,000 Units by mouth daily.     Yes Historical Provider, MD  hydrochlorothiazide (MICROZIDE) 12.5 MG capsule take 1 capsule by mouth once daily 12/14/12  Yes Georgina Quint [name], MD  levothyroxine (SYNTHROID, LEVOTHROID) 88 MCG tablet Take 1 tablet (88 mcg total) by mouth daily. 12/06/12  Yes Georgina Quint [name], MD  mirtazapine (REMERON) 15 MG tablet Take 7.5 mg by mouth at bedtime.   Yes Historical Provider, MD  predniSONE (DELTASONE) 5 MG tablet Take 5 mg by mouth daily.   Yes Historical Provider, MD  hydrochlorothiazide (HYDRODIURIL) 12.5 MG tablet Take 12.5 mg by mouth daily. 08/11/11 08/31/12  Tresa Garter, MD    Physical Exam:  Filed Vitals:   01/07/13 1311 01/07/13 1328  BP: 149/73   Pulse: 62   Temp: 97.9 F (36.6 C) 98.4 F (36.9 C)  TempSrc: Oral   Height: 5\' 2"  (1.575 m)   Weight: 49.442 kg (109 lb)   SpO2: 98%     Constitutional: Vital signs reviewed.  Patient is an elderly female lying in bed in no acute distress Head: Normocephalic and atraumatic Mouth: no erythema or exudates, MMM Eyes: PERRL, EOMI, conjunctivae normal, No scleral icterus.  Neck: Supple, Trachea midline normal ROM, No JVD, mass, thyromegaly, or carotid bruit present.  Cardiovascular: RRR, S1 normal, S2 normal, no MRG, pulses symmetric and intact bilaterally Pulmonary/Chest: CTAB, no wheezes, rales, or rhonchi Abdominal: Soft. Non-tender, non-distended, bowel sounds are normal, no masses, organomegaly, or guarding present.  GU: no CVA tenderness Musculoskeletal: No joint  deformities, erythema, or stiffness,  Ext: no edema and no cyanosis, pulses palpable bilaterally  Hematology: no cervical adenopathy.  Neurological: A&O x3,  cranial nerve II-XII are grossly intact, 4/ 5 power over right arm , forearm and hand with poor hand grip. Right pronator drift present. sensory intact to light touch bilaterally.  Skin: Warm, dry and intact. No rash, cyanosis, or clubbing.  Psychiatric: Normal mood and affect. speech and behavior is normal. Judgment and thought content normal. Cognition and memory are normal.   Labs on Admission:  Basic Metabolic Panel:  Recent Labs Lab 01/07/13 1322 01/07/13 1407  NA 140 142  K 3.5 3.5  CL 102 101  CO2 30  --   GLUCOSE 90 90  BUN 12 12  CREATININE 0.69 0.70  CALCIUM 9.7  --    Liver Function Tests:  Recent Labs Lab 01/07/13 1322  AST 27  ALT 10  ALKPHOS 78  BILITOT 0.9  PROT 8.1  ALBUMIN 3.5   No results found for this basename: LIPASE, AMYLASE,  in the last 168 hours No results  found for this basename: AMMONIA,  in the last 168 hours CBC:  Recent Labs Lab 01/07/13 1322 01/07/13 1407  WBC 4.6  --   NEUTROABS 3.3  --   HGB 11.9* 12.2  HCT 34.3* 36.0  MCV 87.9  --   PLT 123*  --    Cardiac Enzymes:  Recent Labs Lab 01/07/13 1329  TROPONINI <0.30   BNP: No components found with this basename: POCBNP,  CBG:  Recent Labs Lab 01/07/13 1406  GLUCAP 102*    Radiological Exams on Admission: Ct Head Wo Contrast  01/07/2013  *RADIOLOGY REPORT*  Clinical Data: Right arm weakness  CT HEAD WITHOUT CONTRAST  Technique:  Contiguous axial images were obtained from the base of the skull through the vertex without contrast.  Comparison: CT head 03/04/2012  Findings: Age appropriate atrophy.  Mild chronic microvascular ischemia in the cerebral white matter, unchanged.  No acute infarct.  Negative for hemorrhage or mass lesion.  Calvarium intact. Mucosal edema in the sphenoid sinus, improved from the prior  study.  Small retention cyst left maxillary sinus, unchanged.  IMPRESSION: Chronic microvascular ischemia.  No acute abnormality.   Original Report Authenticated By: Janeece Riggers, M.D.     EKG: NSR@60 , no ST -T changes  Assessment/Plan  Acute CVA Admit to telemetry. Initial cardiac enzymes negative. Risk factors include HTN, HL. ASA 325 mg daily. If intolerant will switch to plavix Head CT unremarkable. Ordered for an MRI/MRA brain 2-D echo and carotid Dopplers Allow permissive hypertension. Check hemoglobin A1c and lipid panel Bedside swallow evaluation. PT OT Eval. patient lives alone and is quite independent.  Hypertension Hold BP meds,  Allow permissive hypertension.   Hypothyroidism Continue Synthroid. Check TSH  GERD  order PPI  Vertigo  may be symptoms fo minor stroke for past few weeks. Check orthostasis and MRI brain  Rheumatoid arthritis Continual in the and prednisone   DVT Prophylaxis: sq lovenox  Diet: cardiac once cleared for swallow  Code Status: full code Family Communication: daughter at bedside Disposition Plan:  pending  Eddie North [facility] Pager [phone]  If 7PM-7AM, please contact night-coverage [email or web address] Password [email or web address] 01/07/2013, 4:41 PM    Total time spent: 70 minutes

## 2013-01-07 NOTE — ED Provider Notes (Signed)
History     CSN: [id number]  Arrival date & time 01/07/13  1303   First MD Initiated Contact with Patient 01/07/13 1304      Chief Complaint  Patient presents with  . Extremity Weakness    R arm weakness    (Consider location/radiation/quality/duration/timing/severity/associated sxs/prior treatment) HPI Comments: 79 year old female who has a history of breast cancer, hypertension and acid reflux disease who presents with a complaint of right-sided weakness and difficulty ambulating. She noticed on Wednesday that she was having difficulty with using her right arm and hand but this was mild, over the last several days the symptoms are persistent and gradually worsened to the point where she can no longer use a fork to eat and a pain to write and has had asked for how to use simple stools.  Symptoms are gradually worsening, she has no associated difficulty walking and ambulating because of this coordination and balance. She does have a history of vertigo but has not had any spinning feeling for nausea. This is discretely different than her prior vertiginous episodes.  Patient is a 79 y.o. female presenting with extremity weakness. The history is provided by the patient.  Extremity Weakness    Past Medical History  Diagnosis Date  . Hypothyroid   . Asthma   . Arthritis   . Hypertension   . GERD (gastroesophageal reflux disease)   . Depression   . Unspecified vitamin D deficiency   . Allergic rhinitis   . Diverticulitis of colon   . Hepatitis, autoimmune   . Herpes zoster 2011  . Weight loss 6/11  . Giant cell arteritis 2003    devashwar  . Hyperlipidemia     statins increased LFTs  . Cancer     BREAST    Past Surgical History  Procedure Laterality Date  . Appendectomy    . Cholecystectomy    . Breast surgery    . Tonsillectomy    . Abdominal hysterectomy  1988    Family History  Problem Relation Age of Onset  . Diabetes Mother   . Cancer Brother   . Heart disease  Brother   . Cancer Sister   . Heart disease Sister     MI at age 38  . Hypertension Other   . Heart attack Mother     died of MI at age 47    History  Substance Use Topics  . Smoking status: Never Smoker   . Smokeless tobacco: Never Used  . Alcohol Use: No    OB History   Grav Para Term Preterm Abortions TAB SAB Ect Mult Living                  Review of Systems  Musculoskeletal: Positive for extremity weakness.  All other systems reviewed and are negative.    Allergies  Celecoxib; Codeine; Atorvastatin; Rosuvastatin; and Penicillins  Home Medications   Current Outpatient Rx  Name  Route  Sig  Dispense  Refill  . ALPRAZolam (XANAX) 0.5 MG tablet   Oral   Take 0.5-1 tablets (0.25-0.5 mg total) by mouth 3 (three) times daily as needed for sleep or anxiety.   90 tablet   0   . azaTHIOprine (IMURAN) 50 MG tablet      take 1 tablet by mouth once daily   90 tablet   3   . carvedilol (COREG) 25 MG tablet      take 1 tablet by mouth twice a day   180 tablet  3   . cholecalciferol (VITAMIN D) 1000 UNITS tablet   Oral   Take 1,000 Units by mouth daily.           . hydrochlorothiazide (MICROZIDE) 12.5 MG capsule      take 1 capsule by mouth once daily   30 capsule   11   . levothyroxine (SYNTHROID, LEVOTHROID) 88 MCG tablet   Oral   Take 1 tablet (88 mcg total) by mouth daily.   30 tablet   11   . mirtazapine (REMERON) 15 MG tablet   Oral   Take 7.5 mg by mouth at bedtime.         . predniSONE (DELTASONE) 5 MG tablet   Oral   Take 5 mg by mouth daily.         Marland Kitchen EXPIRED: hydrochlorothiazide (HYDRODIURIL) 12.5 MG tablet   Oral   Take 12.5 mg by mouth daily.           BP 149/73  Pulse 62  Temp(Src) 98.4 F (36.9 C) (Oral)  Ht 5\' 2"  (1.575 m)  Wt 109 lb (49.442 kg)  BMI 19.93 kg/m2  SpO2 98%  Physical Exam  Nursing note and vitals reviewed. Constitutional: She appears well-developed and well-nourished. No distress.  HENT:   Head: Normocephalic and atraumatic.  Mouth/Throat: Oropharynx is clear and moist. No oropharyngeal exudate.  Eyes: Conjunctivae and EOM are normal. Pupils are equal, round, and reactive to light. Right eye exhibits no discharge. Left eye exhibits no discharge. No scleral icterus.  Neck: Normal range of motion. Neck supple. No JVD present. No thyromegaly present.  Cardiovascular: Normal rate, regular rhythm, normal heart sounds and intact distal pulses.  Exam reveals no gallop and no friction rub.   No murmur heard. No JVD, no carotid bruits auscultated  Pulmonary/Chest: Effort normal and breath sounds normal. No respiratory distress. She has no wheezes. She has no rales.  Abdominal: Soft. Bowel sounds are normal. She exhibits no distension and no mass. There is no tenderness.  Musculoskeletal: Normal range of motion. She exhibits no edema and no tenderness.  Lymphadenopathy:    She has no cervical adenopathy.  Neurological: She is alert.  Speech is clear, strength is impaired in the right upper extremity where it is 4/5 and there is no pronator drift of the right upper extremity, discoordination of the right arm with finger-nose-finger testing. Memory is intact, cranial nerves III through XII appear to be intact as well.  Skin: Skin is warm and dry. No rash noted. No erythema.  Psychiatric: She has a normal mood and affect. Her behavior is normal.    ED Course  Procedures (including critical care time)  Labs Reviewed  CBC - Abnormal; Notable for the following:    Hemoglobin 11.9 (*)    HCT 34.3 (*)    Platelets 123 (*)    All other components within normal limits  COMPREHENSIVE METABOLIC PANEL - Abnormal; Notable for the following:    GFR calc non Af Amer 81 (*)    All other components within normal limits  GLUCOSE, CAPILLARY - Abnormal; Notable for the following:    Glucose-Capillary 102 (*)    All other components within normal limits  ETHANOL  PROTIME-INR  APTT  DIFFERENTIAL   TROPONIN I  URINE RAPID DRUG SCREEN (HOSP PERFORMED)  URINALYSIS, ROUTINE W REFLEX MICROSCOPIC  POCT I-STAT, CHEM 8   Ct Head Wo Contrast  01/07/2013  *RADIOLOGY REPORT*  Clinical Data: Right arm weakness  CT HEAD WITHOUT  CONTRAST  Technique:  Contiguous axial images were obtained from the base of the skull through the vertex without contrast.  Comparison: CT head 03/04/2012  Findings: Age appropriate atrophy.  Mild chronic microvascular ischemia in the cerebral white matter, unchanged.  No acute infarct.  Negative for hemorrhage or mass lesion.  Calvarium intact. Mucosal edema in the sphenoid sinus, improved from the prior study.  Small retention cyst left maxillary sinus, unchanged.  IMPRESSION: Chronic microvascular ischemia.  No acute abnormality.   Original Report Authenticated By: Janeece Riggers, M.D.      1. Stroke       MDM  The patient's exam is consistent with an acute stroke likely over the last several days, and she is not in a time frame where she would be able to receive thrombolytics. We'll perform a CT scan laboratory evaluation and EKG and admitted to the hospital for further evaluation.   ED ECG REPORT  I personally interpreted this EKG   Date: 01/07/2013   Rate: 60  Rhythm: normal sinus rhythm  QRS Axis: normal  Intervals: normal  ST/T Wave abnormalities: normal  Conduction Disutrbances:none  Narrative Interpretation:   Old EKG Reviewed: Compared with 04/24/2011, no significant changes   CT scan without findings of acute ischemia, laboratory workup thus far is unremarkable. I discussed the patient's care with the hospitalist will admit to telemetry bed for observation and further workup for stroke.  Filed Vitals:   01/07/13 1328  BP:   Pulse:   Temp: 98.4 F (36.9 C)          Vida Roller, MD 01/07/13 1425

## 2013-01-08 ENCOUNTER — Inpatient Hospital Stay (HOSPITAL_COMMUNITY): Payer: Medicare Other

## 2013-01-08 DIAGNOSIS — I6350 Cerebral infarction due to unspecified occlusion or stenosis of unspecified cerebral artery: Secondary | ICD-10-CM

## 2013-01-08 DIAGNOSIS — E039 Hypothyroidism, unspecified: Secondary | ICD-10-CM

## 2013-01-08 DIAGNOSIS — K5730 Diverticulosis of large intestine without perforation or abscess without bleeding: Secondary | ICD-10-CM | POA: Diagnosis not present

## 2013-01-08 DIAGNOSIS — M5030 Other cervical disc degeneration, unspecified cervical region: Secondary | ICD-10-CM | POA: Diagnosis not present

## 2013-01-08 DIAGNOSIS — I10 Essential (primary) hypertension: Secondary | ICD-10-CM

## 2013-01-08 DIAGNOSIS — R42 Dizziness and giddiness: Secondary | ICD-10-CM | POA: Diagnosis not present

## 2013-01-08 DIAGNOSIS — R918 Other nonspecific abnormal finding of lung field: Secondary | ICD-10-CM | POA: Diagnosis not present

## 2013-01-08 DIAGNOSIS — M5020 Other cervical disc displacement, unspecified cervical region: Secondary | ICD-10-CM | POA: Diagnosis not present

## 2013-01-08 DIAGNOSIS — K219 Gastro-esophageal reflux disease without esophagitis: Secondary | ICD-10-CM

## 2013-01-08 LAB — LIPID PANEL
Cholesterol: 178 mg/dL (ref 0–200)
HDL: 58 mg/dL (ref >39)
Total CHOL/HDL Ratio: 3.1 RATIO
Triglycerides: 226 mg/dL — ABNORMAL HIGH (ref <150)

## 2013-01-08 LAB — HEMOGLOBIN A1C: Mean Plasma Glucose: 105 mg/dL (ref <117)

## 2013-01-08 MED ORDER — CLOPIDOGREL BISULFATE 75 MG PO TABS
75.0000 mg | ORAL_TABLET | Freq: Every day | ORAL | Status: DC
  Administered 2013-01-08 – 2013-01-10 (×3): 75 mg via ORAL
  Filled 2013-01-08 (×4): qty 1

## 2013-01-08 MED ORDER — PANTOPRAZOLE SODIUM 40 MG PO TBEC
40.0000 mg | DELAYED_RELEASE_TABLET | Freq: Every day | ORAL | Status: DC
  Administered 2013-01-08 – 2013-01-10 (×3): 40 mg via ORAL
  Filled 2013-01-08 (×3): qty 1

## 2013-01-08 MED ORDER — CIPROFLOXACIN HCL 250 MG PO TABS
250.0000 mg | ORAL_TABLET | Freq: Two times a day (BID) | ORAL | Status: DC
  Administered 2013-01-08 – 2013-01-10 (×5): 250 mg via ORAL
  Filled 2013-01-08 (×7): qty 1

## 2013-01-08 NOTE — Evaluation (Signed)
Occupational Therapy Evaluation Patient Details Name: Michele Diaz MRN: [id number] DOB: 05/19/1932 Today's Date: 01/08/2013 Time: 7829-5621 OT Time Calculation (min): 26 min  OT Assessment / Plan / Recommendation Clinical Impression  This 79 yo female admitted with RUE weakness and found to have Small acute infarct left parietal white matter presents to acute OT with problems below. Will benefit from acute OT with follow up on CIR to get to and I/Mod I level to return home alone.    OT Assessment  Patient needs continued OT Services    Follow Up Recommendations  CIR    Barriers to Discharge Decreased caregiver support Feel she can get to an I/Mod I level with inpatient rehab stay        Frequency  Min 2X/week    Precautions / Restrictions Precautions Precautions: Fall Precaution Comments: very dizzy with changes of position and with following pen left to right with increasing speed Restrictions Weight Bearing Restrictions: No   Pertinent Vitals/Pain Right shoulder pain, heat applied    ADL  Eating/Feeding: Simulated;Set up (has to use left (non-dominant) hand more) Where Assessed - Eating/Feeding: Chair Grooming: Performed;Brushing hair;Minimal assistance Where Assessed - Grooming: Unsupported sitting Upper Body Bathing: Simulated;Minimal assistance Where Assessed - Upper Body Bathing: Unsupported sitting Lower Body Bathing: Simulated;Minimal assistance Where Assessed - Lower Body Bathing: Supported sit to stand Upper Body Dressing: Moderate assistance Where Assessed - Upper Body Dressing: Unsupported sitting Lower Body Dressing: Simulated;Moderate assistance Where Assessed - Lower Body Dressing: Supported sit to stand Toilet Transfer: Minimal assistance;Simulated Statistician Method: Sit to Barista: Comfort height toilet;Grab bars Toileting - Clothing Manipulation and Hygiene: Simulated;Moderate assistance Where Assessed - Toileting Clothing  Manipulation and Hygiene: Standing Equipment Used: Gait belt;Rolling walker Transfers/Ambulation Related to ADLs: Min A for all    OT Diagnosis: Generalized weakness  OT Problem List: Decreased strength;Decreased range of motion;Decreased coordination;Impaired balance (sitting and/or standing);Impaired UE functional use;Decreased knowledge of use of DME or AE OT Treatment Interventions: Self-care/ADL training;Therapeutic activities;Therapeutic exercise;DME and/or AE instruction;Patient/family education;Balance training   OT Goals Acute Rehab OT Goals OT Goal Formulation: With patient Time For Goal Achievement: 01/15/13 Potential to Achieve Goals: Good ADL Goals Pt Will Perform Grooming: with supervision;Unsupported;Standing at sink ADL Goal: Grooming - Progress: Goal set today Pt Will Transfer to Toilet: with supervision;with DME;Comfort height toilet;Grab bars;Regular height toilet;Ambulation ADL Goal: Toilet Transfer - Progress: Goal set today Pt Will Perform Toileting - Clothing Manipulation: with modified independence;Standing ADL Goal: Toileting - Clothing Manipulation - Progress: Goal set today Pt Will Perform Toileting - Hygiene: with modified independence;Sit to stand from 3-in-1/toilet ADL Goal: Toileting - Hygiene - Progress: Goal set today Arm Goals Additional Arm Goal #1: Pt will be S for RUE FM and GM exercises/activities Arm Goal: Additional Goal #1 - Progress: Goal set today  Visit Information  Last OT Received On: 01/08/13 Assistance Needed: +1 PT/OT Co-Evaluation/Treatment: Yes    Subjective Data  Subjective: I can't write with my right hand   Prior Functioning     Home Living Lives With: Alone Type of Home: House Home Access: Level entry Home Layout: One level Bathroom Shower/Tub: Walk-in shower;Door [employer] Toilet: Standard Home Adaptive Equipment: Grab bars in shower;Hand-held shower hose Additional Comments: pt's husband passed a year and a half  ago, sister lives nearby and checks on her (at least by phone) daily. Niece coming to stay with her mid-May for a week. Lives in a townhome. Prior Function Level of Independence: Independent Able to  Take Stairs?: Yes Driving: Yes Vocation: Retired Musician: No difficulties Dominant Hand: Right         Vision/Perception Vision - History Baseline Vision: No visual deficits Patient Visual Report: No change from baseline   Cognition  Cognition Arousal/Alertness: Awake/alert Behavior During Therapy: WFL for tasks assessed/performed Overall Cognitive Status: Within Functional Limits for tasks assessed (though a bit of denial re: current limitations)    Extremity/Trunk Assessment Right Upper Extremity Assessment RUE ROM/Strength/Tone: Deficits RUE ROM/Strength/Tone Deficits: decreased strength compared to left, lag with shoulder flexion compared to LUE, diminished speed for palm up/down compared to LUE RUE Coordination: Deficits RUE Coordination Deficits: dysmetria noted with finger nose, decreased thumb to finger opposition Left Upper Extremity Assessment LUE ROM/Strength/Tone: WFL for tasks assessed Right Lower Extremity Assessment RLE ROM/Strength/Tone: Deficits RLE ROM/Strength/Tone Deficits: knee ext 3+/5, knee flex 3+/5, hip flex 3/5 RLE Sensation: WFL - Light Touch RLE Coordination: Deficits RLE Coordination Deficits: some ataxia noted with MMT Left Lower Extremity Assessment LLE ROM/Strength/Tone: WFL for tasks assessed LLE Sensation: WFL - Light Touch;WFL - Proprioception LLE Coordination: WFL - gross motor Trunk Assessment Trunk Assessment: Normal     Mobility Bed Mobility Bed Mobility: Rolling Right;Right Sidelying to Sit Rolling Right: 5: Supervision;With rail Right Sidelying to Sit: 5: Supervision;With rails Details for Bed Mobility Assistance: pt tried to sit straight up but it hurt her back so cued to roll over first which she did and used  rail but experienced dizziness with transition.  Transfers Sit to Stand: 4: Min guard;From bed;With upper extremity assist Stand to Sit: 4: Min guard;To chair/3-in-1;With upper extremity assist Details for Transfer Assistance: dizziness with change of position, seemed to improve with distance walking     Exercise General Exercises - Lower Extremity Ankle Circles/Pumps: AROM;Both;15 reps;Seated   Balance Balance Balance Assessed: Yes Dynamic Standing Balance Dynamic Standing - Balance Support: During functional activity;No upper extremity supported Dynamic Standing - Level of Assistance: 4: Min assist   End of Session OT - End of Session Equipment Utilized During Treatment: Gait belt Activity Tolerance: Patient tolerated treatment well Patient left: in chair       Evette Georges [phone] 01/08/2013, 1:54 PM

## 2013-01-08 NOTE — Progress Notes (Signed)
VASCULAR LAB PRELIMINARY  PRELIMINARY  PRELIMINARY  PRELIMINARY  Carotid Dopplers completed.    Preliminary report:  There is no ICA stenosis.  Vertebral artery flow is antegrade.  Toshiko Kemler, RVT 01/08/2013, 3:54 PM

## 2013-01-08 NOTE — Progress Notes (Signed)
Physical Therapy Evaluation Patient Details Name: Michele Diaz MRN: [id number] DOB: [birth year] Today's Date: 01/08/2013 Time: 1115-1140 PT Time Calculation (min): 25 min  PT Assessment / Plan / Recommendation Clinical Impression  Pt is 79 yo female s/p left parietal CVA who lives alone and will need further therapy before returning home. Recommend inpt rehab consult as expect her to achieve mod I status again. She has right sided UE and LE weakness as well as gait abnormality and dizziness for which she will be seen by vestibular therapist tomorrow. PT will follow acutely to work on strengthening and balance,     PT Assessment  Patient needs continued PT services    Follow Up Recommendations  CIR    Does the patient have the potential to tolerate intense rehabilitation      Barriers to Discharge Decreased caregiver support      Equipment Recommendations  None recommended by PT    Recommendations for Other Services Rehab consult   Frequency Min 4X/week    Precautions / Restrictions Precautions Precautions: Fall Precaution Comments: very dizzy with changes of position Restrictions Weight Bearing Restrictions: No   Pertinent Vitals/Pain No c/o pain, VSS      Mobility  Bed Mobility Bed Mobility: Rolling Right;Right Sidelying to Sit Rolling Right: 5: Supervision;With rail Right Sidelying to Sit: 5: Supervision;With rails Details for Bed Mobility Assistance: pt tried to sit straight up but it hurt her back so cued to roll over first which she did and used rail but experienced dizziness with transition.  Transfers Transfers: Sit to Stand;Stand to Sit Sit to Stand: 4: Min guard;From bed;With upper extremity assist Stand to Sit: 4: Min guard;To chair/3-in-1;With upper extremity assist Details for Transfer Assistance: dizziness with change of position, seemed to improve with distance walking Ambulation/Gait Ambulation/Gait Assistance: 4: Min guard Ambulation Distance (Feet):  100 Feet Assistive device: Rolling walker;None Ambulation/Gait Assistance Details: no AD used at first but pt unsteady and given her dizziness, gave her RW to use. This imrpoved her gait pattern and confidence and walking speed increased. She tried short walk to chair without it again at the end and became much more cautious and slower and unsteady agan Gait Pattern: Step-through pattern Gait velocity: decreased Stairs: No Wheelchair Mobility Wheelchair Mobility: No Modified Rankin (Stroke Patients Only) Pre-Morbid Rankin Score: No symptoms Modified Rankin: Moderately severe disability    Exercises General Exercises - Lower Extremity Ankle Circles/Pumps: AROM;Both;15 reps;Seated   PT Diagnosis: Difficulty walking;Abnormality of gait;Hemiplegia dominant side  PT Problem List: Decreased strength;Decreased activity tolerance;Decreased balance;Decreased mobility;Decreased coordination;Decreased knowledge of use of DME;Decreased knowledge of precautions PT Treatment Interventions: DME instruction;Gait training;Functional mobility training;Therapeutic activities;Therapeutic exercise;Balance training;Neuromuscular re-education;Patient/family education   PT Goals Acute Rehab PT Goals PT Goal Formulation: With patient Time For Goal Achievement: 01/22/13 Potential to Achieve Goals: Good Pt will go Supine/Side to Sit: with modified independence PT Goal: Supine/Side to Sit - Progress: Goal set today Pt will go Sit to Supine/Side: with modified independence PT Goal: Sit to Supine/Side - Progress: Goal set today Pt will go Sit to Stand: with modified independence PT Goal: Sit to Stand - Progress: Goal set today Pt will go Stand to Sit: with modified independence PT Goal: Stand to Sit - Progress: Goal set today Pt will Ambulate: >150 feet;with modified independence;with least restrictive assistive device PT Goal: Ambulate - Progress: Goal set today  Visit Information  Last PT Received On:  01/08/13 Assistance Needed: +1 PT/OT Co-Evaluation/Treatment: Yes    Subjective Data  Subjective: "  my sister says I fall all the time but I have never broken anything" Patient Stated Goal: return home   Prior Functioning  Home Living Lives With: Alone Type of Home: House Home Access: Level entry Home Layout: One level Bathroom Shower/Tub: Walk-in shower;Door [employer] Toilet: Standard Home Adaptive Equipment: Grab bars in shower;Hand-held shower hose Additional Comments: pt's husband passed a year and a half ago, sister lives nearby and checks on her (at least by phone) daily. Niece coming to stay with her mid-May for a week. Lives in a townhome. Prior Function Level of Independence: Independent Able to Take Stairs?: Yes Driving: Yes Vocation: Retired Musician: No difficulties Dominant Hand: Right    Cognition  Cognition Arousal/Alertness: Awake/alert Behavior During Therapy: WFL for tasks assessed/performed Overall Cognitive Status: Within Functional Limits for tasks assessed (though a bit of denial re: current limitations)    Extremity/Trunk Assessment Right Upper Extremity Assessment RUE ROM/Strength/Tone: Deficits RUE ROM/Strength/Tone Deficits: decreased strength compared to left, see OT note RUE Coordination: Deficits RUE Coordination Deficits: dysmetria noted with finger nose Left Upper Extremity Assessment LUE ROM/Strength/Tone: WFL for tasks assessed Right Lower Extremity Assessment RLE ROM/Strength/Tone: Deficits RLE ROM/Strength/Tone Deficits: knee ext 3+/5, knee flex 3+/5, hip flex 3/5 RLE Sensation: WFL - Light Touch RLE Coordination: Deficits RLE Coordination Deficits: some ataxia noted with MMT Left Lower Extremity Assessment LLE ROM/Strength/Tone: WFL for tasks assessed LLE Sensation: WFL - Light Touch;WFL - Proprioception LLE Coordination: WFL - gross motor Trunk Assessment Trunk Assessment: Normal   Balance Balance Balance  Assessed: Yes Dynamic Standing Balance Dynamic Standing - Balance Support: During functional activity;No upper extremity supported Dynamic Standing - Level of Assistance: 4: Min assist  End of Session PT - End of Session Equipment Utilized During Treatment: Gait belt Activity Tolerance: Patient tolerated treatment well;Other (comment) (limited by dizziness) Patient left: in chair;with call bell/phone within reach Nurse Communication: Mobility status  GP   Lyanne Co, PT  Acute Rehab Services  [phone]   Lyanne Co 01/08/2013, 1:36 PM

## 2013-01-08 NOTE — Progress Notes (Signed)
Patient ID: Michele Diaz  female  ZOX:[id number]    DOB: 01/30/1932    DOA: 01/07/2013  PCP: [name], MD  Assessment/Plan: Principal Problem:   Stroke: Presenting symptoms with right hand weakness - MRI brain showed small acute infarct left parietal white matter, chronic microvascular ischemia - MRA showed mild stenosis of the parietal branch of left middle cerebral artery - 2-D echo, carotid Dopplers pending - PTOT recommending inpatient rehabilitation - CIR consult placed - Patient had GI upset with aspirin in the past, discussed with neurology, started on Plavix  Active Problems:  Neck pain: Patient also reports right-sided and posterior neck pain for the last 2-3 weeks - Ordered MRI of the cervical spine  Escherichia coli UTI: - Placed on ciprofloxacin (has penicillin allergy)    HYPOTHYROIDISM: Continue Synthroid    DEPRESSION/ANXIETY: Continue Xanax as needed    HYPERTENSION: Stable    GERD:  continue PPI     VERTIGO; likely secondary to acute CVA, will add meclizine as needed      BREAST CANCER, HX OF  RHEUMATOID ARTHRITIS : Continue prednisone, Imuran    DVT Prophylaxis: Lovenox  Code Status:  Disposition:    Subjective:  right hand weakness persisting   Objective: Weight change:  No intake or output data in the 24 hours ending 01/08/13 1428 Blood pressure 126/57, pulse 54, temperature 97.5 F (36.4 C), temperature source Oral, resp. rate 20, height 5\' 2"  (1.575 m), weight 49.442 kg (109 lb), SpO2 98.00%.  Physical Exam: General: Alert and awake, oriented x3, not in any acute distress. HEENT: anicteric sclera, pupils reactive to light and accommodation, EOMI CVS: S1-S2 clear, no murmur rubs or gallops Chest: clear to auscultation bilaterally, no wheezing, rales or rhonchi Abdomen: soft nontender, nondistended, normal bowel sounds, no organomegaly Extremities: no cyanosis, clubbing or edema noted bilaterally Neuro: right handgrip weak, right  pronator drift, 5/5 in both lower extremities, no dysarthria alert and oriented x3  Lab Results: Basic Metabolic Panel:  Recent Labs Lab 01/07/13 1322 01/07/13 1407  NA 140 142  K 3.5 3.5  CL 102 101  CO2 30  --   GLUCOSE 90 90  BUN 12 12  CREATININE 0.69 0.70  CALCIUM 9.7  --    Liver Function Tests:  Recent Labs Lab 01/07/13 1322  AST 27  ALT 10  ALKPHOS 78  BILITOT 0.9  PROT 8.1  ALBUMIN 3.5   No results found for this basename: LIPASE, AMYLASE,  in the last 168 hours No results found for this basename: AMMONIA,  in the last 168 hours CBC:  Recent Labs Lab 01/07/13 1322 01/07/13 1407  WBC 4.6  --   NEUTROABS 3.3  --   HGB 11.9* 12.2  HCT 34.3* 36.0  MCV 87.9  --   PLT 123*  --    Cardiac Enzymes:  Recent Labs Lab 01/07/13 1329  TROPONINI <0.30   BNP: No components found with this basename: POCBNP,  CBG:  Recent Labs Lab 01/07/13 1406  GLUCAP 102*     Micro Results: Recent Results (from the past 240 hour(s))  URINE CULTURE     Status: None   Collection Time    01/07/13  4:26 PM      Result Value Range Status   Specimen Description URINE, CLEAN CATCH   Final   Special Requests NONE   Final   Culture  Setup Time 01/07/2013 18:45   Final   Colony Count >=100,000 COLONIES/ML   Final   Culture  ESCHERICHIA COLI   Final   Report Status PENDING   Incomplete    Studies/Results: Dg Chest 2 View  01/08/2013  *RADIOLOGY REPORT*  Clinical Data: Left cerebral infarct.  Right hand weakness.  CHEST - 2 VIEW  Comparison: 04/03/2010  Findings: Upper limits normal heart size noted. There is no evidence of focal airspace disease, pulmonary edema, suspicious pulmonary nodule/mass, pleural effusion, or pneumothorax. No acute bony abnormalities are identified.  IMPRESSION: Upper limits normal heart size without evidence of acute cardiopulmonary disease.   Original Report Authenticated By: Harmon Pier, M.D.    Ct Head Wo Contrast  01/07/2013  *RADIOLOGY  REPORT*  Clinical Data: Right arm weakness  CT HEAD WITHOUT CONTRAST  Technique:  Contiguous axial images were obtained from the base of the skull through the vertex without contrast.  Comparison: CT head 03/04/2012  Findings: Age appropriate atrophy.  Mild chronic microvascular ischemia in the cerebral white matter, unchanged.  No acute infarct.  Negative for hemorrhage or mass lesion.  Calvarium intact. Mucosal edema in the sphenoid sinus, improved from the prior study.  Small retention cyst left maxillary sinus, unchanged.  IMPRESSION: Chronic microvascular ischemia.  No acute abnormality.   Original Report Authenticated By: Janeece Riggers, M.D.    Mr Brain Wo Contrast  01/07/2013  *RADIOLOGY REPORT*  Clinical Data:  Stroke.  Right hand weakness  MRI HEAD WITHOUT CONTRAST MRA HEAD WITHOUT CONTRAST  Technique:  Multiplanar, multiecho pulse sequences of the brain and surrounding structures were obtained without intravenous contrast. Angiographic images of the head were obtained using MRA technique without contrast.  Comparison:  CT head 01/07/2013  MRI HEAD  Findings:  1 cm acute infarct left parietal white matter.  No other acute infarct.  Mild chronic microvascular ischemic change in the white matter. Brainstem and cerebellum are intact.  There is mild atrophy.  Scattered areas of micro hemorrhage in the brain consistent with chronic hypertension.  Negative for mass or edema.  No midline shift.  Complete opacification of the sphenoid sinus.  Mild mucosal thickening left maxillary sinus.  IMPRESSION: Small acute infarct left parietal white matter.  Chronic microvascular ischemia.  Mild chronic micro hemorrhage in the brain most likely due to chronic hypertension.  MRA HEAD  Findings: Both vertebral arteries are patent to the basilar.  PICA is patent bilaterally.  The basilar is widely patent.  Fetal origin of the posterior cerebral artery bilaterally with hypoplastic P1 segments bilaterally.  The basilar ends in  the superior cerebellar arteries which are patent bilaterally.  The internal carotid artery is patent bilaterally without stenosis.  The right middle cerebral arteries widely patent.  Both anterior cerebral arteries are patent.  Mild stenosis in the left middle cerebral artery involving the parietal branch just past the bifurcation.  Negative for cerebral aneurysm.  IMPRESSION:   Mild stenosis of the parietal branch of the left middle cerebral artery.   Original Report Authenticated By: Janeece Riggers, M.D.    Mr Mra Head/brain Wo Cm  01/07/2013  *RADIOLOGY REPORT*  Clinical Data:  Stroke.  Right hand weakness  MRI HEAD WITHOUT CONTRAST MRA HEAD WITHOUT CONTRAST  Technique:  Multiplanar, multiecho pulse sequences of the brain and surrounding structures were obtained without intravenous contrast. Angiographic images of the head were obtained using MRA technique without contrast.  Comparison:  CT head 01/07/2013  MRI HEAD  Findings:  1 cm acute infarct left parietal white matter.  No other acute infarct.  Mild chronic microvascular ischemic change in the white  matter. Brainstem and cerebellum are intact.  There is mild atrophy.  Scattered areas of micro hemorrhage in the brain consistent with chronic hypertension.  Negative for mass or edema.  No midline shift.  Complete opacification of the sphenoid sinus.  Mild mucosal thickening left maxillary sinus.  IMPRESSION: Small acute infarct left parietal white matter.  Chronic microvascular ischemia.  Mild chronic micro hemorrhage in the brain most likely due to chronic hypertension.  MRA HEAD  Findings: Both vertebral arteries are patent to the basilar.  PICA is patent bilaterally.  The basilar is widely patent.  Fetal origin of the posterior cerebral artery bilaterally with hypoplastic P1 segments bilaterally.  The basilar ends in the superior cerebellar arteries which are patent bilaterally.  The internal carotid artery is patent bilaterally without stenosis.  The right  middle cerebral arteries widely patent.  Both anterior cerebral arteries are patent.  Mild stenosis in the left middle cerebral artery involving the parietal branch just past the bifurcation.  Negative for cerebral aneurysm.  IMPRESSION:   Mild stenosis of the parietal branch of the left middle cerebral artery.   Original Report Authenticated By: Janeece Riggers, M.D.     Medications: Scheduled Meds: . azaTHIOprine  50 mg Oral Daily  . cholecalciferol  1,000 Units Oral Daily  . ciprofloxacin  250 mg Oral BID  . clopidogrel  75 mg Oral Q breakfast  . enoxaparin (LOVENOX) injection  40 mg Subcutaneous Q24H  . levothyroxine  88 mcg Oral Q breakfast  . mirtazapine  7.5 mg Oral QHS  . pantoprazole  40 mg Oral Q1200  . predniSONE  5 mg Oral Q breakfast      LOS: 1 day   RAI,RIPUDEEP M.D. [facility] 01/08/2013, 2:29 PM Pager: [phone]  If 7PM-7AM, please contact night-coverage [email or web address] Password [email or web address]

## 2013-01-08 NOTE — Progress Notes (Signed)
  Echocardiogram 2D Echocardiogram has been performed.  Michele Diaz 01/08/2013, 3:24 PM

## 2013-01-08 NOTE — Progress Notes (Signed)
Stroke Team Progress Note  HISTORY Michele Diaz is an 79 y.o. female who condones a multitude of different symptoms starting 01/04/13 about noon. These include, not feeling her feet well, off balance, right neck pain, chronic HA (but was not present yesterday). She was admitted to hospital after she was concerned about the fact she was not able to pull her credit card out of her wallet or write a check with her right hand yesterday. She states she was unable to move her right arm last night but this has improved over the last 12 hours--but she still feels her right hand is weak.  Date last known well: 01/08/13  Time last known well: 01/08/13  tPA Given: No: out of window . She was admitted to the neuro floor bed for further evaluation and treatment.  SUBJECTIVE Her  Family is not at the bedside.  Overall she feels her condition is gradually improving.    OBJECTIVE Most recent Vital Signs: Filed Vitals:   01/07/13 2258 01/08/13 0051 01/08/13 0255 01/08/13 0531  BP: 126/59 116/57 107/55 126/57  Pulse: 53 51 53 54  Temp: 97.7 F (36.5 C) 97.5 F (36.4 C) 97.3 F (36.3 C) 97.5 F (36.4 C)  TempSrc: Oral Oral Oral Oral  Resp: 20 20 20 20   Height:      Weight:      SpO2: 98% 97% 98% 98%   CBG (last 3)   Recent Labs  01/07/13 1406  GLUCAP 102*    IV Fluid Intake:     MEDICATIONS  . aspirin  325 mg Oral Daily  . azaTHIOprine  50 mg Oral Daily  . cholecalciferol  1,000 Units Oral Daily  . ciprofloxacin  250 mg Oral BID  . enoxaparin (LOVENOX) injection  40 mg Subcutaneous Q24H  . levothyroxine  88 mcg Oral Q breakfast  . mirtazapine  7.5 mg Oral QHS  . pantoprazole  40 mg Oral Q1200  . predniSONE  5 mg Oral Q breakfast   PRN:  ALPRAZolam, senna-docusate  Diet:  Cardiac  s Activity:  Bedrest  DVT Prophylaxis:  Lovenox  CLINICALLY SIGNIFICANT STUDIES Basic Metabolic Panel:  Recent Labs Lab 01/07/13 1322 01/07/13 1407  NA 140 142  K 3.5 3.5  CL 102 101  CO2 30  --    GLUCOSE 90 90  BUN 12 12  CREATININE 0.69 0.70  CALCIUM 9.7  --    Liver Function Tests:  Recent Labs Lab 01/07/13 1322  AST 27  ALT 10  ALKPHOS 78  BILITOT 0.9  PROT 8.1  ALBUMIN 3.5   CBC:  Recent Labs Lab 01/07/13 1322 01/07/13 1407  WBC 4.6  --   NEUTROABS 3.3  --   HGB 11.9* 12.2  HCT 34.3* 36.0  MCV 87.9  --   PLT 123*  --    Coagulation:  Recent Labs Lab 01/07/13 1322  LABPROT 13.2  INR 1.01   Cardiac Enzymes:  Recent Labs Lab 01/07/13 1329  TROPONINI <0.30   Urinalysis:  Recent Labs Lab 01/07/13 1626  COLORURINE YELLOW  LABSPEC 1.012  PHURINE 6.0  GLUCOSEU NEGATIVE  HGBUR NEGATIVE  BILIRUBINUR NEGATIVE  KETONESUR NEGATIVE  PROTEINUR NEGATIVE  UROBILINOGEN 0.2  NITRITE POSITIVE*  LEUKOCYTESUR TRACE*   Lipid Panel    Component Value Date/Time   CHOL 178 01/08/2013 0645   TRIG 226* 01/08/2013 0645   HDL 58 01/08/2013 0645   CHOLHDL 3.1 01/08/2013 0645   VLDL 45* 01/08/2013 0645   LDLCALC 75 01/08/2013  0645   HgbA1C  No results found for this basename: HGBA1C    Urine Drug Screen:     Component Value Date/Time   LABOPIA NONE DETECTED 01/07/2013 1626   COCAINSCRNUR NONE DETECTED 01/07/2013 1626   LABBENZ NONE DETECTED 01/07/2013 1626   AMPHETMU NONE DETECTED 01/07/2013 1626   THCU NONE DETECTED 01/07/2013 1626   LABBARB NONE DETECTED 01/07/2013 1626    Alcohol Level:  Recent Labs Lab 01/07/13 1322  ETH <11    Ct Head Wo Contrast  01/07/2013  *RADIOLOGY REPORT*  Clinical Data: Right arm weakness  CT HEAD WITHOUT CONTRAST  Technique:  Contiguous axial images were obtained from the base of the skull through the vertex without contrast.  Comparison: CT head 03/04/2012  Findings: Age appropriate atrophy.  Mild chronic microvascular ischemia in the cerebral white matter, unchanged.  No acute infarct.  Negative for hemorrhage or mass lesion.  Calvarium intact. Mucosal edema in the sphenoid sinus, improved from the prior study.  Small retention  cyst left maxillary sinus, unchanged.  IMPRESSION: Chronic microvascular ischemia.  No acute abnormality.   Original Report Authenticated By: Janeece Riggers, M.D.    Mr Brain Wo Contrast  01/07/2013  *RADIOLOGY REPORT*  Clinical Data:  Stroke.  Right hand weakness  MRI HEAD WITHOUT CONTRAST MRA HEAD WITHOUT CONTRAST  Technique:  Multiplanar, multiecho pulse sequences of the brain and surrounding structures were obtained without intravenous contrast. Angiographic images of the head were obtained using MRA technique without contrast.  Comparison:  CT head 01/07/2013  MRI HEAD  Findings:  1 cm acute infarct left parietal white matter.  No other acute infarct.  Mild chronic microvascular ischemic change in the white matter. Brainstem and cerebellum are intact.  There is mild atrophy.  Scattered areas of micro hemorrhage in the brain consistent with chronic hypertension.  Negative for mass or edema.  No midline shift.  Complete opacification of the sphenoid sinus.  Mild mucosal thickening left maxillary sinus.  IMPRESSION: Small acute infarct left parietal white matter.  Chronic microvascular ischemia.  Mild chronic micro hemorrhage in the brain most likely due to chronic hypertension.  MRA HEAD  Findings: Both vertebral arteries are patent to the basilar.  PICA is patent bilaterally.  The basilar is widely patent.  Fetal origin of the posterior cerebral artery bilaterally with hypoplastic P1 segments bilaterally.  The basilar ends in the superior cerebellar arteries which are patent bilaterally.  The internal carotid artery is patent bilaterally without stenosis.  The right middle cerebral arteries widely patent.  Both anterior cerebral arteries are patent.  Mild stenosis in the left middle cerebral artery involving the parietal branch just past the bifurcation.  Negative for cerebral aneurysm.  IMPRESSION:   Mild stenosis of the parietal branch of the left middle cerebral artery.   Original Report Authenticated By:  Janeece Riggers, M.D.    Mr Mra Head/brain Wo Cm  01/07/2013  *RADIOLOGY REPORT*  Clinical Data:  Stroke.  Right hand weakness  MRI HEAD WITHOUT CONTRAST MRA HEAD WITHOUT CONTRAST  Technique:  Multiplanar, multiecho pulse sequences of the brain and surrounding structures were obtained without intravenous contrast. Angiographic images of the head were obtained using MRA technique without contrast.  Comparison:  CT head 01/07/2013  MRI HEAD  Findings:  1 cm acute infarct left parietal white matter.  No other acute infarct.  Mild chronic microvascular ischemic change in the white matter. Brainstem and cerebellum are intact.  There is mild atrophy.  Scattered areas of micro  hemorrhage in the brain consistent with chronic hypertension.  Negative for mass or edema.  No midline shift.  Complete opacification of the sphenoid sinus.  Mild mucosal thickening left maxillary sinus.  IMPRESSION: Small acute infarct left parietal white matter.  Chronic microvascular ischemia.  Mild chronic micro hemorrhage in the brain most likely due to chronic hypertension.  MRA HEAD  Findings: Both vertebral arteries are patent to the basilar.  PICA is patent bilaterally.  The basilar is widely patent.  Fetal origin of the posterior cerebral artery bilaterally with hypoplastic P1 segments bilaterally.  The basilar ends in the superior cerebellar arteries which are patent bilaterally.  The internal carotid artery is patent bilaterally without stenosis.  The right middle cerebral arteries widely patent.  Both anterior cerebral arteries are patent.  Mild stenosis in the left middle cerebral artery involving the parietal branch just past the bifurcation.  Negative for cerebral aneurysm.  IMPRESSION:   Mild stenosis of the parietal branch of the left middle cerebral artery.   Original Report Authenticated By: Janeece Riggers, M.D.       2D Echocardiogram  pending  Carotid Doppler  pending  CXR  ordered  EKG  SINUS RHYTHM ~ normal P axis,  V-rate 50- 99 EARLY PRECORDIAL R/S TRANSITION ~ QRS area positive in V2  Therapy Recommendations  pending  Physical Exam  Pleasant elderly caucasian frail lady not in distress.Awake alert. Afebrile. Head is nontraumatic. Neck is supple without bruit. Hearing is normal. Cardiac exam no murmur or gallop. Lungs are clear to auscultation. Distal pulses are well felt. Neurological Exam ; Awake alert oriented x 3 normal speech and language. Mild right lower face asymmetry. Tongue midline. No drift. Mild diminished fine finger movements on right and significant right hand weakness.t. Orbits left over right upper extremity.  .. Normal sensation . Normal coordination. Gait not tested. ASSESSMENT Michele Diaz is a 79 y.o. female presenting with  Right sided weakness due to left subcortical infarct due to small vessel disease.   On no prior to admission. Now on aspirin 325 mg orally every day for secondary stroke prevention. Patient with resultant right hand weakness. Work up underway.   Vascular Risk factors - HT and Hyperlipidimia  Not a statin candidate as LDL below 100 and h/o statin intolerance Hospital day # 1  TREATMENT/PLAN  Change to  clopidogrel 75 mg orally every day for secondary stroke prevention as she has h/o GI irritation with aspirin in past..  Mobilize out of bed. PT/OT/ST.  Check ECHO, dopplers    Delia Heady, MD 01/08/2013 11:08 AM   .

## 2013-01-09 DIAGNOSIS — M353 Polymyalgia rheumatica: Secondary | ICD-10-CM

## 2013-01-09 DIAGNOSIS — R079 Chest pain, unspecified: Secondary | ICD-10-CM

## 2013-01-09 DIAGNOSIS — K5730 Diverticulosis of large intestine without perforation or abscess without bleeding: Secondary | ICD-10-CM | POA: Diagnosis not present

## 2013-01-09 DIAGNOSIS — R42 Dizziness and giddiness: Secondary | ICD-10-CM | POA: Diagnosis not present

## 2013-01-09 DIAGNOSIS — R071 Chest pain on breathing: Secondary | ICD-10-CM

## 2013-01-09 DIAGNOSIS — I6350 Cerebral infarction due to unspecified occlusion or stenosis of unspecified cerebral artery: Secondary | ICD-10-CM | POA: Diagnosis not present

## 2013-01-09 LAB — URINE CULTURE: Colony Count: >=100000

## 2013-01-09 MED ORDER — MECLIZINE HCL 12.5 MG PO TABS
12.5000 mg | ORAL_TABLET | Freq: Two times a day (BID) | ORAL | Status: DC
  Administered 2013-01-09 – 2013-01-10 (×3): 12.5 mg via ORAL
  Filled 2013-01-09 (×4): qty 1

## 2013-01-09 NOTE — Progress Notes (Signed)
Patient ID: Michele Diaz  female  ZOX:[id number]    DOB: 1932/04/02    DOA: 01/07/2013  PCP: [name], MD  Assessment/Plan: Principal Problem:   Stroke: Presenting symptoms with right hand weakness - MRI brain showed small acute infarct left parietal white matter, chronic microvascular ischemia - MRA showed mild stenosis of the parietal branch of left middle cerebral artery - 2-D echo showed EF of 60%, normal wall motion  - carotid Dopplers- no ICA stenosis, vertebral artery flow is antegrade - PTOT recommending inpatient rehabilitation - CIR consult placed -  placed on Plavix by neurology  Active Problems:  Neck pain: Patient also reports right-sided and posterior neck pain for the last 2-3 weeks -  MRI of the cervical spine was negative for any nerve root encroachment, mild degenerative changes  Escherichia coli UTI: - Placed on ciprofloxacin (has penicillin allergy), per sensitivities okay on ciprofloxacin    HYPOTHYROIDISM: Continue Synthroid    DEPRESSION/ANXIETY: Continue Xanax as needed    HYPERTENSION: Stable    GERD:  continue PPI     VERTIGO; likely secondary to acute CVA, will add meclizine      BREAST CANCER, HX OF  RHEUMATOID ARTHRITIS : Continue prednisone, Imuran    DVT Prophylaxis: Lovenox  Code Status:  Disposition:Inpatient rehabilitation consult pending    Subjective:  right hand weakness persisting , No acute events overnight  Objective: Weight change:   Intake/Output Summary (Last 24 hours) at 01/09/13 1157 Last data filed at 01/09/13 0700  Gross per 24 hour  Intake    240 ml  Output      0 ml  Net    240 ml   Blood pressure 136/48, pulse 54, temperature 97.8 F (36.6 C), temperature source Oral, resp. rate 20, height 5\' 2"  (1.575 m), weight 49.442 kg (109 lb), SpO2 98.00%.  Physical Exam: General: Alert and awake, oriented x3, not in any acute distress. CVS: S1-S2 clear, no murmur rubs or gallops Chest: clear to auscultation  bilaterally, no wheezing, rales or rhonchi Abdomen: soft nontender, nondistended, normal bowel sounds,  Extremities: no cyanosis, clubbing or edema noted bilaterally Neuro: right handgrip weak, right pronator drift, 5/5 in both lower extremities, no dysarthria alert and oriented x3  Lab Results: Basic Metabolic Panel:  Recent Labs Lab 01/07/13 1322 01/07/13 1407  NA 140 142  K 3.5 3.5  CL 102 101  CO2 30  --   GLUCOSE 90 90  BUN 12 12  CREATININE 0.69 0.70  CALCIUM 9.7  --    Liver Function Tests:  Recent Labs Lab 01/07/13 1322  AST 27  ALT 10  ALKPHOS 78  BILITOT 0.9  PROT 8.1  ALBUMIN 3.5   No results found for this basename: LIPASE, AMYLASE,  in the last 168 hours No results found for this basename: AMMONIA,  in the last 168 hours CBC:  Recent Labs Lab 01/07/13 1322 01/07/13 1407  WBC 4.6  --   NEUTROABS 3.3  --   HGB 11.9* 12.2  HCT 34.3* 36.0  MCV 87.9  --   PLT 123*  --    Cardiac Enzymes:  Recent Labs Lab 01/07/13 1329  TROPONINI <0.30   BNP: No components found with this basename: POCBNP,  CBG:  Recent Labs Lab 01/07/13 1406  GLUCAP 102*     Micro Results: Recent Results (from the past 240 hour(s))  URINE CULTURE     Status: None   Collection Time    01/07/13  4:26 PM  Result Value Range Status   Specimen Description URINE, CLEAN CATCH   Final   Special Requests NONE   Final   Culture  Setup Time 01/07/2013 18:45   Final   Colony Count >=100,000 COLONIES/ML   Final   Culture ESCHERICHIA COLI   Final   Report Status 01/09/2013 FINAL   Final   Organism ID, Bacteria ESCHERICHIA COLI   Final    Studies/Results: Dg Chest 2 View  01/08/2013  *RADIOLOGY REPORT*  Clinical Data: Left cerebral infarct.  Right hand weakness.  CHEST - 2 VIEW  Comparison: 04/03/2010  Findings: Upper limits normal heart size noted. There is no evidence of focal airspace disease, pulmonary edema, suspicious pulmonary nodule/mass, pleural effusion, or  pneumothorax. No acute bony abnormalities are identified.  IMPRESSION: Upper limits normal heart size without evidence of acute cardiopulmonary disease.   Original Report Authenticated By: Harmon Pier, M.D.    Ct Head Wo Contrast  01/07/2013  *RADIOLOGY REPORT*  Clinical Data: Right arm weakness  CT HEAD WITHOUT CONTRAST  Technique:  Contiguous axial images were obtained from the base of the skull through the vertex without contrast.  Comparison: CT head 03/04/2012  Findings: Age appropriate atrophy.  Mild chronic microvascular ischemia in the cerebral white matter, unchanged.  No acute infarct.  Negative for hemorrhage or mass lesion.  Calvarium intact. Mucosal edema in the sphenoid sinus, improved from the prior study.  Small retention cyst left maxillary sinus, unchanged.  IMPRESSION: Chronic microvascular ischemia.  No acute abnormality.   Original Report Authenticated By: Janeece Riggers, M.D.    Mr Brain Wo Contrast  01/07/2013  *RADIOLOGY REPORT*  Clinical Data:  Stroke.  Right hand weakness  MRI HEAD WITHOUT CONTRAST MRA HEAD WITHOUT CONTRAST  Technique:  Multiplanar, multiecho pulse sequences of the brain and surrounding structures were obtained without intravenous contrast. Angiographic images of the head were obtained using MRA technique without contrast.  Comparison:  CT head 01/07/2013  MRI HEAD  Findings:  1 cm acute infarct left parietal white matter.  No other acute infarct.  Mild chronic microvascular ischemic change in the white matter. Brainstem and cerebellum are intact.  There is mild atrophy.  Scattered areas of micro hemorrhage in the brain consistent with chronic hypertension.  Negative for mass or edema.  No midline shift.  Complete opacification of the sphenoid sinus.  Mild mucosal thickening left maxillary sinus.  IMPRESSION: Small acute infarct left parietal white matter.  Chronic microvascular ischemia.  Mild chronic micro hemorrhage in the brain most likely due to chronic hypertension.   MRA HEAD  Findings: Both vertebral arteries are patent to the basilar.  PICA is patent bilaterally.  The basilar is widely patent.  Fetal origin of the posterior cerebral artery bilaterally with hypoplastic P1 segments bilaterally.  The basilar ends in the superior cerebellar arteries which are patent bilaterally.  The internal carotid artery is patent bilaterally without stenosis.  The right middle cerebral arteries widely patent.  Both anterior cerebral arteries are patent.  Mild stenosis in the left middle cerebral artery involving the parietal branch just past the bifurcation.  Negative for cerebral aneurysm.  IMPRESSION:   Mild stenosis of the parietal branch of the left middle cerebral artery.   Original Report Authenticated By: Janeece Riggers, M.D.    Mr Mra Head/brain Wo Cm  01/07/2013  *RADIOLOGY REPORT*  Clinical Data:  Stroke.  Right hand weakness  MRI HEAD WITHOUT CONTRAST MRA HEAD WITHOUT CONTRAST  Technique:  Multiplanar, multiecho pulse sequences of  the brain and surrounding structures were obtained without intravenous contrast. Angiographic images of the head were obtained using MRA technique without contrast.  Comparison:  CT head 01/07/2013  MRI HEAD  Findings:  1 cm acute infarct left parietal white matter.  No other acute infarct.  Mild chronic microvascular ischemic change in the white matter. Brainstem and cerebellum are intact.  There is mild atrophy.  Scattered areas of micro hemorrhage in the brain consistent with chronic hypertension.  Negative for mass or edema.  No midline shift.  Complete opacification of the sphenoid sinus.  Mild mucosal thickening left maxillary sinus.  IMPRESSION: Small acute infarct left parietal white matter.  Chronic microvascular ischemia.  Mild chronic micro hemorrhage in the brain most likely due to chronic hypertension.  MRA HEAD  Findings: Both vertebral arteries are patent to the basilar.  PICA is patent bilaterally.  The basilar is widely patent.  Fetal  origin of the posterior cerebral artery bilaterally with hypoplastic P1 segments bilaterally.  The basilar ends in the superior cerebellar arteries which are patent bilaterally.  The internal carotid artery is patent bilaterally without stenosis.  The right middle cerebral arteries widely patent.  Both anterior cerebral arteries are patent.  Mild stenosis in the left middle cerebral artery involving the parietal branch just past the bifurcation.  Negative for cerebral aneurysm.  IMPRESSION:   Mild stenosis of the parietal branch of the left middle cerebral artery.   Original Report Authenticated By: Janeece Riggers, M.D.     Medications: Scheduled Meds: . azaTHIOprine  50 mg Oral Daily  . cholecalciferol  1,000 Units Oral Daily  . ciprofloxacin  250 mg Oral BID  . clopidogrel  75 mg Oral Q breakfast  . enoxaparin (LOVENOX) injection  40 mg Subcutaneous Q24H  . levothyroxine  88 mcg Oral Q breakfast  . meclizine  12.5 mg Oral BID  . mirtazapine  7.5 mg Oral QHS  . pantoprazole  40 mg Oral Q1200  . predniSONE  5 mg Oral Q breakfast      LOS: 2 days   RAI,RIPUDEEP M.D. [facility] 01/09/2013, 11:57 AM Pager: [phone]  If 7PM-7AM, please contact night-coverage [email or web address] Password [email or web address]

## 2013-01-09 NOTE — Progress Notes (Signed)
Stroke Team Progress Note  HISTORY Michele Diaz is a 79 y.o. female who condones a multitude of different symptoms starting 01/04/13 about noon. These include, not feeling her feet well, off balance, right neck pain, chronic HA (but was not present yesterday). She was admitted to hospital after she was concerned about the fact she was not able to pull her credit card out of her wallet or write a check with her right hand yesterday. She states she was unable to move her right arm last night but this has improved over the last 12 hours--but she still feels her right hand is weak.   Date last known well: 01/08/13  Time last known well: 01/08/13  tPA Given: No: out of window She was admitted to the neuro floor for further evaluation and treatment.  SUBJECTIVE There are no family members in the room this morning. The patient reports she is feeling somewhat better although she continues to have episodic dizziness. She was treated for this problem in the past by Dr. Posey Rea with meclizine.  OBJECTIVE Most recent Vital Signs: Filed Vitals:   01/08/13 2206 01/09/13 0135 01/09/13 0557 01/09/13 0900  BP: 114/69 109/58 126/66 136/48  Pulse: 53 70 54 54  Temp: 97.9 F (36.6 C) 97.7 F (36.5 C) 97.5 F (36.4 C) 97.8 F (36.6 C)  TempSrc: Oral Oral Oral Oral  Resp: 18 20 20 20   Height:      Weight:      SpO2: 97% 96% 97% 98%   CBG (last 3)   Recent Labs  01/07/13 1406  GLUCAP 102*    IV Fluid Intake:     MEDICATIONS  . azaTHIOprine  50 mg Oral Daily  . cholecalciferol  1,000 Units Oral Daily  . ciprofloxacin  250 mg Oral BID  . clopidogrel  75 mg Oral Q breakfast  . enoxaparin (LOVENOX) injection  40 mg Subcutaneous Q24H  . levothyroxine  88 mcg Oral Q breakfast  . meclizine  12.5 mg Oral BID  . mirtazapine  7.5 mg Oral QHS  . pantoprazole  40 mg Oral Q1200  . predniSONE  5 mg Oral Q breakfast   PRN:  ALPRAZolam, senna-docusate  Diet:  Cardiac  with thin liquids Activity:   Bedrest  DVT Prophylaxis:  Lovenox  CLINICALLY SIGNIFICANT STUDIES Basic Metabolic Panel:   Recent Labs Lab 01/07/13 1322 01/07/13 1407  NA 140 142  K 3.5 3.5  CL 102 101  CO2 30  --   GLUCOSE 90 90  BUN 12 12  CREATININE 0.69 0.70  CALCIUM 9.7  --    Liver Function Tests:   Recent Labs Lab 01/07/13 1322  AST 27  ALT 10  ALKPHOS 78  BILITOT 0.9  PROT 8.1  ALBUMIN 3.5   CBC:   Recent Labs Lab 01/07/13 1322 01/07/13 1407  WBC 4.6  --   NEUTROABS 3.3  --   HGB 11.9* 12.2  HCT 34.3* 36.0  MCV 87.9  --   PLT 123*  --    Coagulation:   Recent Labs Lab 01/07/13 1322  LABPROT 13.2  INR 1.01   Cardiac Enzymes:   Recent Labs Lab 01/07/13 1329  TROPONINI <0.30   Urinalysis:   Recent Labs Lab 01/07/13 1626  COLORURINE YELLOW  LABSPEC 1.012  PHURINE 6.0  GLUCOSEU NEGATIVE  HGBUR NEGATIVE  BILIRUBINUR NEGATIVE  KETONESUR NEGATIVE  PROTEINUR NEGATIVE  UROBILINOGEN 0.2  NITRITE POSITIVE*  LEUKOCYTESUR TRACE*   Lipid Panel    Component Value  Date/Time   CHOL 178 01/08/2013 0645   TRIG 226* 01/08/2013 0645   HDL 58 01/08/2013 0645   CHOLHDL 3.1 01/08/2013 0645   VLDL 45* 01/08/2013 0645   LDLCALC 75 01/08/2013 0645   HgbA1C  Lab Results  Component Value Date   HGBA1C 5.3 01/08/2013    Urine Drug Screen:     Component Value Date/Time   LABOPIA NONE DETECTED 01/07/2013 1626   COCAINSCRNUR NONE DETECTED 01/07/2013 1626   LABBENZ NONE DETECTED 01/07/2013 1626   AMPHETMU NONE DETECTED 01/07/2013 1626   THCU NONE DETECTED 01/07/2013 1626   LABBARB NONE DETECTED 01/07/2013 1626    Alcohol Level:   Recent Labs Lab 01/07/13 1322  ETH <11    Dg Chest 2 View 01/08/13 Upper limits normal heart size without evidence of acute cardiopulmonary disease.    Ct Head Wo Contrast 01/07/13 Chronic microvascular ischemia.  No acute abnormality.  .    Mr Brain Wo Contrast 01/07/13 Small acute infarct left parietal white matter.  Chronic microvascular  ischemia.  Mild chronic micro hemorrhage in the brain most likely due to chronic hypertension.    MRA HEAD   Mild stenosis of the parietal branch of the left middle cerebral artery.   Mr Cervical Spine Wo Contrast 01/08/13 Mild cervical degenerative changes.  No stenosis or neural impingement.     2D Echocardiogram  ejection fraction 60%. No obvious cardiac source of emboli.  Carotid Doppler  Preliminary report: There is no ICA stenosis. Vertebral artery flow is antegrade.  EKG  SINUS RHYTHM ~ normal P axis, V-rate 50- 99 EARLY PRECORDIAL R/S TRANSITION ~ QRS area positive in V2  Therapy Recommendations  inpatient rehabilitation recommended. Consult pending.  Physical Exam  Pleasant elderly caucasian frail lady not in distress.Awake alert. Afebrile. Head is nontraumatic. Neck is supple without bruit. Hearing is normal. Cardiac exam no murmur or gallop. Lungs are clear to auscultation. Distal pulses are well felt. Neurological Exam ; Awake alert oriented x 3 normal speech and language. Mild right lower face asymmetry. Tongue midline. No drift. Mild diminished fine finger movements on right and significant right hand weakness. Difficulty with heel-to-shin maneuver on the right.  Orbits left over right upper extremity. Normal sensation . Normal coordination. Gait not tested.   ASSESSMENT Michele Diaz is a 79 y.o. female presenting with  Right sided weakness due to left subcortical infarct due to small vessel disease.   On no anticoagulation prior to admission. Now on aspirin 325 mg orally every day for secondary stroke prevention. Patient with resultant right hand weakness. Work up underway.   Vascular Risk factors - hypertension and hyperlipidemia - Not a statin candidate as LDL below 100 and h/o statin intolerance  Dizziness with history of vertigo. Currently on meclizine 12.5 mg twice a day.   Hospital day # 2   TREATMENT/PLAN  Change to  clopidogrel 75 mg orally every day for  secondary stroke prevention as she has h/o GI irritation with aspirin in past..  Mobilize out of bed. PT/OT/ST - inpatient rehabilitation recommended - consult pending.  Await carotid Dopplers.   Delton See PA-C [facility] Pager [phone] 01/09/2013, 10:47 AM   I have personally examined this patient, reviewed pertinent data and agree with plan of care Delia Heady, MD   .

## 2013-01-09 NOTE — Progress Notes (Signed)
Utilization Review Completed.   Reba Hulett, RN, BSN Nurse Case Manager  [phone]  

## 2013-01-09 NOTE — Progress Notes (Signed)
Occupational Therapy Treatment Patient Details Name: Michele Diaz MRN: [id number] DOB: [birth year] Today's Date: 01/09/2013 Time: 4098-1191 OT Time Calculation (min): 40 min  OT Assessment / Plan / Recommendation Comments on Treatment Session Pt making steady progress and very motivated to participate with therapy. Pt is excellent rehab candidate. Pt with apparent R hemiparesis, sensory deficits, and ? R visual field deficit. Pt c/o symptoms consistent with BPVV. Rec vestibular eval . Pt c/o dizziness with positional changes only. Pt unsafe to D/C home at this time. Needs CIR to facilitate return to MOD I level to D/C home.    Follow Up Recommendations  CIR    Barriers to Discharge       Equipment Recommendations  None recommended by OT    Recommendations for Other Services Rehab consult;Other (comment) (vestibular eval)  Frequency Min 2X/week   Plan Discharge plan remains appropriate    Precautions / Restrictions Precautions Precautions: Fall Precaution Comments: ? BPVV? (R inattention)   Pertinent Vitals/Pain no apparent distress     ADL  Eating/Feeding: Set up;Other (comment) (given tubing to use with R hand) ADL Comments: focus of session on RUE neuro rehab. Pt given theraputty and written theraputtey exercises. Sensation further assessed. Pt appears to demonstrate difficutly with proprioception. RUE. Vision also further assessed. Pt c/o blurry vision R side. Pt had difficulty with smooth saccades to R visual field. Also had difficulty with visual attnetion/fixation in R field. Unable to maintain focus on target in R field. Pt missed targets on R during simultaneous stimulation. Will further assess.   OT Diagnosis:    OT Problem List:   OT Treatment Interventions:     OT Goals Acute Rehab OT Goals OT Goal Formulation: With patient Time For Goal Achievement: 01/15/13 Potential to Achieve Goals: Good ADL Goals Pt Will Perform Grooming: with  supervision;Unsupported;Standing at sink ADL Goal: Grooming - Progress: Progressing toward goals Pt Will Transfer to Toilet: with supervision;with DME;Comfort height toilet;Grab bars;Regular height toilet;Ambulation ADL Goal: Toilet Transfer - Progress: Progressing toward goals Pt Will Perform Toileting - Clothing Manipulation: with modified independence;Standing ADL Goal: Toileting - Clothing Manipulation - Progress: Progressing toward goals Pt Will Perform Toileting - Hygiene: with modified independence;Sit to stand from 3-in-1/toilet ADL Goal: Toileting - Hygiene - Progress: Progressing toward goals Arm Goals Additional Arm Goal #1: Pt will be S for RUE FM and GM exercises/activities Arm Goal: Additional Goal #1 - Progress: Progressing toward goals  Visit Information  Last OT Received On: 01/09/13 Assistance Needed: +1    Subjective Data      Prior Functioning       Cognition  Cognition Arousal/Alertness: Awake/alert Behavior During Therapy: WFL for tasks assessed/performed Overall Cognitive Status: Within Functional Limits for tasks assessed    Mobility  Transfers Transfers: Sit to Stand;Stand to Sit Sit to Stand: 4: Min assist Stand to Sit: 4: Min assist Details for Transfer Assistance: Ambulating with mod A for midline orientation. Unable to turn head and walk without manual facilitation to recover balance.     Exercises   theraputty. Fine motor. Gross motor activites   Balance  Mod A during dynamic tasks.   End of Session OT - End of Session Equipment Utilized During Treatment: Gait belt Activity Tolerance: Patient tolerated treatment well Patient left: in chair;with call bell/phone within reach Nurse Communication: Mobility status  GO     Melinda Pottinger,HILLARY 01/09/2013, 6:18 PM [facility], OTR/L  [phone] 01/09/2013

## 2013-01-10 ENCOUNTER — Inpatient Hospital Stay (HOSPITAL_COMMUNITY)
Admission: RE | Admit: 2013-01-10 | Discharge: 2013-01-14 | DRG: 945 | Disposition: A | Discharge status: Home / Self Care | Payer: Medicare Other | Source: Intra-hospital | Attending: Physical Medicine & Rehabilitation | Admitting: Physical Medicine & Rehabilitation

## 2013-01-10 ENCOUNTER — Encounter (HOSPITAL_COMMUNITY): Payer: Self-pay | Admitting: *Deleted

## 2013-01-10 DIAGNOSIS — K219 Gastro-esophageal reflux disease without esophagitis: Secondary | ICD-10-CM | POA: Diagnosis not present

## 2013-01-10 DIAGNOSIS — I639 Cerebral infarction, unspecified: Secondary | ICD-10-CM

## 2013-01-10 DIAGNOSIS — E559 Vitamin D deficiency, unspecified: Secondary | ICD-10-CM | POA: Diagnosis not present

## 2013-01-10 DIAGNOSIS — G8190 Hemiplegia, unspecified affecting unspecified side: Secondary | ICD-10-CM

## 2013-01-10 DIAGNOSIS — M069 Rheumatoid arthritis, unspecified: Secondary | ICD-10-CM | POA: Diagnosis not present

## 2013-01-10 DIAGNOSIS — I6330 Cerebral infarction due to thrombosis of unspecified cerebral artery: Secondary | ICD-10-CM

## 2013-01-10 DIAGNOSIS — F411 Generalized anxiety disorder: Secondary | ICD-10-CM

## 2013-01-10 DIAGNOSIS — R42 Dizziness and giddiness: Secondary | ICD-10-CM | POA: Diagnosis not present

## 2013-01-10 DIAGNOSIS — M316 Other giant cell arteritis: Secondary | ICD-10-CM

## 2013-01-10 DIAGNOSIS — E039 Hypothyroidism, unspecified: Secondary | ICD-10-CM

## 2013-01-10 DIAGNOSIS — I10 Essential (primary) hypertension: Secondary | ICD-10-CM

## 2013-01-10 DIAGNOSIS — A498 Other bacterial infections of unspecified site: Secondary | ICD-10-CM

## 2013-01-10 DIAGNOSIS — IMO0002 Reserved for concepts with insufficient information to code with codable children: Secondary | ICD-10-CM | POA: Diagnosis not present

## 2013-01-10 DIAGNOSIS — E785 Hyperlipidemia, unspecified: Secondary | ICD-10-CM | POA: Diagnosis not present

## 2013-01-10 DIAGNOSIS — Z5189 Encounter for other specified aftercare: Secondary | ICD-10-CM | POA: Diagnosis not present

## 2013-01-10 DIAGNOSIS — F329 Major depressive disorder, single episode, unspecified: Secondary | ICD-10-CM | POA: Diagnosis not present

## 2013-01-10 DIAGNOSIS — IMO0001 Reserved for inherently not codable concepts without codable children: Secondary | ICD-10-CM

## 2013-01-10 DIAGNOSIS — Z79899 Other long term (current) drug therapy: Secondary | ICD-10-CM | POA: Diagnosis not present

## 2013-01-10 DIAGNOSIS — F3289 Other specified depressive episodes: Secondary | ICD-10-CM

## 2013-01-10 DIAGNOSIS — I6350 Cerebral infarction due to unspecified occlusion or stenosis of unspecified cerebral artery: Secondary | ICD-10-CM | POA: Diagnosis not present

## 2013-01-10 DIAGNOSIS — N390 Urinary tract infection, site not specified: Secondary | ICD-10-CM | POA: Diagnosis not present

## 2013-01-10 DIAGNOSIS — Z923 Personal history of irradiation: Secondary | ICD-10-CM

## 2013-01-10 DIAGNOSIS — Z853 Personal history of malignant neoplasm of breast: Secondary | ICD-10-CM | POA: Diagnosis not present

## 2013-01-10 DIAGNOSIS — I672 Cerebral atherosclerosis: Secondary | ICD-10-CM

## 2013-01-10 DIAGNOSIS — G8110 Spastic hemiplegia affecting unspecified side: Secondary | ICD-10-CM | POA: Diagnosis not present

## 2013-01-10 LAB — CBC
HCT: 35.3 % — ABNORMAL LOW (ref 36.0–46.0)
Hemoglobin: 12.4 g/dL (ref 12.0–15.0)
MCHC: 35.1 g/dL (ref 30.0–36.0)
RDW: 13.5 % (ref 11.5–15.5)
WBC: 7.8 10*3/uL (ref 4.0–10.5)

## 2013-01-10 LAB — CREATININE, SERUM
Creatinine, Ser: 0.92 mg/dL (ref 0.50–1.10)
GFR calc Af Amer: 67 mL/min — ABNORMAL LOW (ref >90)
GFR calc non Af Amer: 58 mL/min — ABNORMAL LOW (ref >90)

## 2013-01-10 MED ORDER — ONDANSETRON HCL 4 MG/2ML IJ SOLN: 4.0000 mg | Freq: Four times a day (QID) | INTRAMUSCULAR | Status: DC | PRN

## 2013-01-10 MED ORDER — SENNOSIDES-DOCUSATE SODIUM 8.6-50 MG PO TABS
1.0000 | ORAL_TABLET | Freq: Every evening | ORAL | Status: DC | PRN
  Administered 2013-01-10: 1 via ORAL
  Filled 2013-01-10: qty 1

## 2013-01-10 MED ORDER — SORBITOL 70 % SOLN: 30.0000 mL | Freq: Every day | Status: DC | PRN

## 2013-01-10 MED ORDER — CLOPIDOGREL BISULFATE 75 MG PO TABS
75.0000 mg | ORAL_TABLET | Freq: Every day | ORAL | Status: DC
  Administered 2013-01-11 – 2013-01-14 (×4): 75 mg via ORAL
  Filled 2013-01-10 (×5): qty 1

## 2013-01-10 MED ORDER — MIRTAZAPINE 7.5 MG PO TABS
7.5000 mg | ORAL_TABLET | Freq: Every day | ORAL | Status: DC
  Administered 2013-01-10 – 2013-01-13 (×4): 7.5 mg via ORAL
  Filled 2013-01-10 (×5): qty 1

## 2013-01-10 MED ORDER — MECLIZINE HCL 12.5 MG PO TABS
12.5000 mg | ORAL_TABLET | Freq: Two times a day (BID) | ORAL | Status: DC
  Administered 2013-01-10 – 2013-01-14 (×8): 12.5 mg via ORAL
  Filled 2013-01-10 (×10): qty 1

## 2013-01-10 MED ORDER — VITAMIN D3 25 MCG (1000 UNIT) PO TABS
1000.0000 [IU] | ORAL_TABLET | Freq: Every day | ORAL | Status: DC
Start: 2013-01-11 — End: 2013-01-14
  Administered 2013-01-11 – 2013-01-14 (×4): 1000 [IU] via ORAL
  Filled 2013-01-10 (×5): qty 1

## 2013-01-10 MED ORDER — ENOXAPARIN SODIUM 40 MG/0.4ML ~~LOC~~ SOLN: 40.0000 mg | SUBCUTANEOUS | Status: DC

## 2013-01-10 MED ORDER — ACETAMINOPHEN 325 MG PO TABS: 325.0000 mg | ORAL_TABLET | ORAL | Status: DC | PRN

## 2013-01-10 MED ORDER — ONDANSETRON HCL 4 MG PO TABS: 4.0000 mg | ORAL_TABLET | Freq: Four times a day (QID) | ORAL | Status: DC | PRN

## 2013-01-10 MED ORDER — LEVOTHYROXINE SODIUM 88 MCG PO TABS
88.0000 ug | ORAL_TABLET | Freq: Every day | ORAL | Status: DC
  Administered 2013-01-11 – 2013-01-14 (×4): 88 ug via ORAL
  Filled 2013-01-10 (×5): qty 1

## 2013-01-10 MED ORDER — PANTOPRAZOLE SODIUM 40 MG PO TBEC
40.0000 mg | DELAYED_RELEASE_TABLET | Freq: Every day | ORAL | Status: DC
  Administered 2013-01-11 – 2013-01-13 (×3): 40 mg via ORAL
  Filled 2013-01-10 (×5): qty 1

## 2013-01-10 MED ORDER — PREDNISONE 5 MG PO TABS
5.0000 mg | ORAL_TABLET | Freq: Every day | ORAL | Status: DC
  Administered 2013-01-11 – 2013-01-14 (×4): 5 mg via ORAL
  Filled 2013-01-10 (×5): qty 1

## 2013-01-10 MED ORDER — CARVEDILOL 25 MG PO TABS
25.0000 mg | ORAL_TABLET | Freq: Two times a day (BID) | ORAL | Status: DC
  Filled 2013-01-10 (×2): qty 1

## 2013-01-10 MED ORDER — CIPROFLOXACIN HCL 250 MG PO TABS
250.0000 mg | ORAL_TABLET | Freq: Two times a day (BID) | ORAL | Status: DC
  Administered 2013-01-10 – 2013-01-14 (×8): 250 mg via ORAL
  Filled 2013-01-10 (×10): qty 1

## 2013-01-10 MED ORDER — AZATHIOPRINE 50 MG PO TABS
50.0000 mg | ORAL_TABLET | Freq: Every day | ORAL | Status: DC
  Administered 2013-01-11 – 2013-01-14 (×4): 50 mg via ORAL
  Filled 2013-01-10 (×5): qty 1

## 2013-01-10 MED ORDER — ALPRAZOLAM 0.25 MG PO TABS: 0.2500 mg | ORAL_TABLET | Freq: Three times a day (TID) | ORAL | Status: DC | PRN

## 2013-01-10 MED ORDER — ENOXAPARIN SODIUM 40 MG/0.4ML ~~LOC~~ SOLN
40.0000 mg | SUBCUTANEOUS | Status: DC
  Administered 2013-01-11 – 2013-01-14 (×4): 40 mg via SUBCUTANEOUS
  Filled 2013-01-10 (×5): qty 0.4

## 2013-01-10 NOTE — Progress Notes (Signed)
Admitting patient to CIR today. Dr Isidoro Donning reports pt is ready to d/c to CIR.  [phone]

## 2013-01-10 NOTE — Progress Notes (Signed)
Met with patient at bedside to discuss possible CIR admission. Patient lives alone and would benefit from inpatient rehab to reach modified independent level for a safe discharge home. Patient is in agreement with plan. Await order from Dr Isidoro Donning re: pt readiness for d/c to CIR today. I can be reached at [phone]

## 2013-01-10 NOTE — Progress Notes (Signed)
SLP Cancellation Note  Patient Details Name: Michele Diaz MRN: [id number] DOB: September 05, 1932   Cancelled treatment:       Reason Eval/Treat Not Completed: Other (comment): Pt to be admitted to CIR this pm. No SLP eval completed, will defer to next level of care though all notes reports pts cognitive linguistic function appearing to be WNL.    Shailah Gibbins, Riley Nearing 01/10/2013, 2:21 PM

## 2013-01-10 NOTE — Plan of Care (Signed)
Overall Plan of Care [facility]) Patient Details Name: Michele Diaz MRN: [id number] DOB: Mar 12, 1932  Diagnosis:  Rehabilitation for left parietal infarct  Co-morbidities: Hypertension, GERD, hypothyroidism, depression, hyperlipidemia  Functional Problem List  Patient demonstrates impairments in the following areas: Balance, Bowel, Cognition, Endurance, Medication Management, Motor, Safety and Sensory   Basic ADL's: eating, grooming, bathing, dressing and toileting Advanced ADL's: simple meal preparation, laundry and light housekeeping  Transfers:  bed mobility, bed to chair, car and floor Locomotion:  ambulation and stairs  Additional Impairments:  Functional use of upper extremity  Anticipated Outcomes Item Anticipated Outcome  Eating/Swallowing  Mod I  Basic self-care  Mod I  Tolieting  Mod I  Bowel/Bladder  Continent of bowel and bladder  Transfers  Mod I  Locomotion  Mod I  Communication    Cognition    Pain  </=2  Safety/Judgment  No falls with injury  Other     Therapy Plan: PT Intensity: Minimum of 1-2 x/day ,45 to 90 minutes PT Frequency: 5 out of 7 days PT Duration Estimated Length of Stay: 5-7 days OT Intensity: Minimum of 1-2 x/day, 45 to 90 minutes OT Frequency: 5 out of 7 days OT Duration/Estimated Length of Stay: 5-7 days      Team Interventions: Item RN PT OT SLP SW TR Other  Self Care/Advanced ADL Retraining   x      Neuromuscular Re-Education  x x      Therapeutic Activities  x x      UE/LE Strength Training/ROM  x x      UE/LE Coordination Activities  x x      Visual/Perceptual Remediation/Compensation         DME/Adaptive Equipment Instruction         Therapeutic Exercise  x x      Balance/Vestibular Training  x x      Patient/Family Education x x x      Cognitive Remediation/Compensation   x      Functional Mobility Training  x x      Ambulation/Gait Training  x       Insurance risk surveyor Reintegration  x x      Dysphagia/Aspiration Film/video editor         Bladder Management x        Bowel Management x        Disease Management/Prevention x        Pain Management x        Medication Management x        Skin Care/Wound Management x        Splinting/Orthotics         Discharge Planning x x x      Psychosocial Support x  x                          Team Discharge Planning: Destination: PT-Home ,OT- Home , SLP-  Projected Follow-up: PT-Outpatient PT, OT-  Home health OT, SLP-  Projected Equipment Needs: PT-None recommended by PT, OT-  , SLP-  Patient/family involved in discharge planning: PT- Patient,  OT-Patient, SLP-   MD ELOS: 5-7 days Medical Rehab Prognosis:  Excellent Assessment: 79 year old female living independently who developed right-sided weakness  primarily affecting the upper extremity. Now requiring 24 7 rehabilitation RN and M.D. As well as CIR level PT OT. High-level goals including homemaking ADLs and safe mobility. Return to independent living.    See Team Conference Notes for weekly updates to the plan of care

## 2013-01-10 NOTE — Progress Notes (Signed)
Physical Therapy Treatment Patient Details Name: Michele Diaz MRN: [id number] DOB: April 20, 1932 Today's Date: 01/10/2013 Time: 1001-1045 PT Time Calculation (min): 44 min  PT Assessment / Plan / Recommendation Comments on Treatment Session  Initiated assessing pt from a vestibular standpoint, but had a limited amount of time.  Answers to subjective questions suggested positional vertigo, but her description of the quality ie lightheaded vs spinning was vague.  BPPV testing was inconclusive..  Hallpike-Dix to check for [place] BPPV did not produce nystagmus; on the Left no s/s and on the right there was a little bit of dizzy feeling, but "not bad".  Supine head roll for Horiz[place] BPPV produced similar results.  No nystagmas, no s/s to L and an unusual dizzy feeling on the R.  Nothing definitive so did not immediatedly treat with Epply or Barbeque Roll.  Feelings of "dizziness" were ellicited with repetitive scanning in the halls and directional changes.  These episodes left pt guarded with gait and made her continue afterward to look only forward.  Will do occulomotor testing next as time did not allow continuing today.    Follow Up Recommendations  CIR     Does the patient have the potential to tolerate intense rehabilitation     Barriers to Discharge        Equipment Recommendations  None recommended by PT    Recommendations for Other Services Rehab consult  Frequency Min 4X/week   Plan Discharge plan remains appropriate;Frequency remains appropriate    Precautions / Restrictions Precautions Precautions: Fall Precaution Comments: dizziness with positional changes continues-- no overt signs of BPPV will continue assessment as time allows Restrictions Weight Bearing Restrictions: No   Pertinent Vitals/Pain     Mobility  Bed Mobility Bed Mobility: Rolling Left;Left Sidelying to Sit;Sitting - Scoot to Edge of Bed;Sit to Supine Rolling Left: 6: Modified independent  (Device/Increase time);With rail Left Sidelying to Sit: 5: Supervision;With rails;HOB flat Sitting - Scoot to Edge of Bed: 5: Supervision Sit to Supine: 5: Supervision Transfers Transfers: Sit to Stand;Stand to Sit Sit to Stand: 4: Min guard Stand to Sit: 4: Min guard Details for Transfer Assistance: close by for dizziness on standing Ambulation/Gait Ambulation/Gait Assistance: 4: Min guard;4: Min assist;Other (comment) (min with scanning and higher level balance) Ambulation Distance (Feet): 280 Feet Assistive device: None Ambulation/Gait Assistance Details: generally steady but guarded in straight line scanning ahead.  But as she scans or other challenges added she becomes unsteady and needs occ stability assist. Gait Pattern: Step-through pattern Gait velocity: decreased Stairs: No    Exercises     PT Diagnosis:    PT Problem List:   PT Treatment Interventions:     PT Goals Acute Rehab PT Goals Time For Goal Achievement: 01/22/13 Potential to Achieve Goals: Good Pt will go Supine/Side to Sit: with modified independence PT Goal: Supine/Side to Sit - Progress: Progressing toward goal Pt will go Sit to Supine/Side: with modified independence PT Goal: Sit to Supine/Side - Progress: Progressing toward goal Pt will go Sit to Stand: with modified independence PT Goal: Sit to Stand - Progress: Progressing toward goal Pt will go Stand to Sit: with modified independence PT Goal: Stand to Sit - Progress: Progressing toward goal Pt will Ambulate: >150 feet;with modified independence;with least restrictive assistive device PT Goal: Ambulate - Progress: Progressing toward goal  Visit Information  Last PT Received On: 01/10/13 Assistance Needed: +1    Subjective Data  Subjective: This feels a lot like when I had  vertigo about a year ago. Patient Stated Goal: return home   Cognition  Cognition Arousal/Alertness: Awake/alert Behavior During Therapy: WFL for tasks  assessed/performed Overall Cognitive Status: Within Functional Limits for tasks assessed    Balance  Balance Balance Assessed: Yes Dynamic Standing Balance Dynamic Standing - Balance Support: During functional activity;No upper extremity supported Dynamic Standing - Level of Assistance: 4: Min assist High Level Balance High Level Balance Activites: Backward walking;Direction changes;Turns;Sudden stops;Head turns High Level Balance Comments: These challenges produced guarding and tentative behaviors at best and instability with assist needed at worst.  End of Session PT - End of Session Activity Tolerance: Patient tolerated treatment well Patient left: in bed;with call bell/phone within reach Nurse Communication: Mobility status   GP     Katiria Calame, Eliseo Gum 01/10/2013, 12:17 PM  01/10/2013  [place], PT [phone] (pager)

## 2013-01-10 NOTE — Progress Notes (Signed)
Stroke Team Progress Note  HISTORY Michele Diaz is a 79 y.o. female who condones a multitude of different symptoms starting 01/04/13 about noon. These include, not feeling her feet well, off balance, right neck pain, chronic HA (but was not present yesterday). She was admitted to hospital after she was concerned about the fact she was not able to pull her credit card out of her wallet or write a check with her right hand yesterday. She states she was unable to move her right arm last night but this has improved over the last 12 hours--but she still feels her right hand is weak.   Date last known well: 01/08/13  Time last known well: 01/08/13  tPA Given: No: out of window She was admitted to the neuro floor for further evaluation and treatment.  SUBJECTIVE Patient ready for discharge to rehab.  OBJECTIVE Most recent Vital Signs: Filed Vitals:   01/09/13 2200 01/10/13 0200 01/10/13 0600 01/10/13 1050  BP: 135/80 120/48 107/87 149/62  Pulse: 58 61 63 61  Temp: 97.8 F (36.6 C) 97.5 F (36.4 C) 97.5 F (36.4 C) 97.6 F (36.4 C)  TempSrc:    Oral  Resp: 16 16 16 18   Height:      Weight:      SpO2: 100% 98% 100% 98%   CBG (last 3)   Recent Labs  01/07/13 1406  GLUCAP 102*    IV Fluid Intake:     MEDICATIONS  . azaTHIOprine  50 mg Oral Daily  . carvedilol  25 mg Oral BID WC  . cholecalciferol  1,000 Units Oral Daily  . ciprofloxacin  250 mg Oral BID  . clopidogrel  75 mg Oral Q breakfast  . enoxaparin (LOVENOX) injection  40 mg Subcutaneous Q24H  . levothyroxine  88 mcg Oral Q breakfast  . meclizine  12.5 mg Oral BID  . mirtazapine  7.5 mg Oral QHS  . pantoprazole  40 mg Oral Q1200  . predniSONE  5 mg Oral Q breakfast   PRN:  ALPRAZolam, senna-docusate  Diet:  Cardiac  with thin liquids Activity:  Bedrest  DVT Prophylaxis:  Lovenox  CLINICALLY SIGNIFICANT STUDIES Basic Metabolic Panel:   Recent Labs Lab 01/07/13 1322 01/07/13 1407  NA 140 142  K 3.5 3.5  CL 102  101  CO2 30  --   GLUCOSE 90 90  BUN 12 12  CREATININE 0.69 0.70  CALCIUM 9.7  --    Liver Function Tests:   Recent Labs Lab 01/07/13 1322  AST 27  ALT 10  ALKPHOS 78  BILITOT 0.9  PROT 8.1  ALBUMIN 3.5   CBC:   Recent Labs Lab 01/07/13 1322 01/07/13 1407  WBC 4.6  --   NEUTROABS 3.3  --   HGB 11.9* 12.2  HCT 34.3* 36.0  MCV 87.9  --   PLT 123*  --    Coagulation:   Recent Labs Lab 01/07/13 1322  LABPROT 13.2  INR 1.01   Cardiac Enzymes:   Recent Labs Lab 01/07/13 1329  TROPONINI <0.30   Urinalysis:   Recent Labs Lab 01/07/13 1626  COLORURINE YELLOW  LABSPEC 1.012  PHURINE 6.0  GLUCOSEU NEGATIVE  HGBUR NEGATIVE  BILIRUBINUR NEGATIVE  KETONESUR NEGATIVE  PROTEINUR NEGATIVE  UROBILINOGEN 0.2  NITRITE POSITIVE*  LEUKOCYTESUR TRACE*   Lipid Panel    Component Value Date/Time   CHOL 178 01/08/2013 0645   TRIG 226* 01/08/2013 0645   HDL 58 01/08/2013 0645   CHOLHDL 3.1 01/08/2013  0645   VLDL 45* 01/08/2013 0645   LDLCALC 75 01/08/2013 0645   HgbA1C  Lab Results  Component Value Date   HGBA1C 5.3 01/08/2013    Urine Drug Screen:     Component Value Date/Time   LABOPIA NONE DETECTED 01/07/2013 1626   COCAINSCRNUR NONE DETECTED 01/07/2013 1626   LABBENZ NONE DETECTED 01/07/2013 1626   AMPHETMU NONE DETECTED 01/07/2013 1626   THCU NONE DETECTED 01/07/2013 1626   LABBARB NONE DETECTED 01/07/2013 1626    Alcohol Level:   Recent Labs Lab 01/07/13 1322  ETH <11    Dg Chest 2 View 01/08/13 Upper limits normal heart size without evidence of acute cardiopulmonary disease.    Ct Head Wo Contrast 01/07/13 Chronic microvascular ischemia.  No acute abnormality.  .    Mr Brain Wo Contrast 01/07/13 Small acute infarct left parietal white matter.  Chronic microvascular ischemia.  Mild chronic micro hemorrhage in the brain most likely due to chronic hypertension.    MRA HEAD   Mild stenosis of the parietal branch of the left middle cerebral artery.    Mr Cervical Spine Wo Contrast 01/08/13 Mild cervical degenerative changes.  No stenosis or neural impingement.     2D Echocardiogram  ejection fraction 60%. No obvious cardiac source of emboli.  Carotid Doppler   There is no ICA stenosis. Vertebral artery flow is antegrade.  EKG  SINUS RHYTHM ~ normal P axis, V-rate 50- 99 EARLY PRECORDIAL R/S TRANSITION ~ QRS area positive in V2  Therapy Recommendations  inpatient rehabilitation recommended. Consult pending.  Physical Exam  Pleasant elderly caucasian frail lady not in distress.Awake alert. Afebrile. Head is nontraumatic. Neck is supple without bruit. Hearing is normal. Cardiac exam no murmur or gallop. Lungs are clear to auscultation. Distal pulses are well felt. Neurological Exam ; Awake alert oriented x 3 normal speech and language. Mild right lower face asymmetry. Tongue midline. No drift. Mild diminished fine finger movements on right and significant right hand weakness. Difficulty with heel-to-shin maneuver on the right.  Orbits left over right upper extremity. Normal sensation . Normal coordination. Gait not tested.   ASSESSMENT Michele Diaz is a 79 y.o. female presenting with  Right sided weakness due to left subcortical infarct due to small vessel disease.   On no anticoagulation prior to admission. Now on aspirin 325 mg orally every day for secondary stroke prevention. Patient with resultant right hand weakness. Work up completed. Patient needs rehab.   Vascular Risk factors - hypertension and hyperlipidemia - Not a statin candidate as LDL below 100 and h/o statin intolerance  Dizziness with history of vertigo. Currently on meclizine 12.5 mg twice a day.  Hospital day # 3  TREATMENT/PLAN  Change to  clopidogrel 75 mg orally every day for secondary stroke prevention as she has h/o GI irritation with aspirin in past..  Agree with plans for rehab transfer today No further stroke workup indicated. Patient has a 10-15%  risk of having another stroke over the next year, the highest risk is within 2 weeks of the most recent stroke/TIA (risk of having a stroke following a stroke or TIA is the same). Ongoing risk factor control by Primary Care Physician Stroke Service will sign off. Please call should any needs arise. Follow up with Dr. Pearlean Brownie, Stroke Clinic, in 2 months. D/W Dr Berneda Rose, MSN, RN, ANVP-BC, ANP-BC, GNP-BC [facility] Pager: [phone] 01/10/2013 11:27 AM  I have personally obtained a history, examined the  patient, evaluated imaging results, and formulated the assessment and plan of care. I agree with the above.   Delia Heady, MD

## 2013-01-10 NOTE — Consult Note (Signed)
Physical Medicine and Rehabilitation Consult Reason for Consult: CVA Referring Physician: [facility]   HPI: Michele Diaz is a 79 y.o. female with history of rheumatoid arthritis, hypertension, breast cancer with lumpectomy and radiation therapy. Admitted 01/07/2013 with right-sided weakness x2-3 days as well as bouts of vertigo. MRI of the brain showed small acute infarct left parietal white matter. MRA of the head mild stenosis of the parietal branch of the left middle cerebral artery. Echocardiogram with ejection fraction of 60% without emboli. Carotid Dopplers with no ICA stenosis. Patient did not receive TPA. Neurology services consulted placed on Plavix therapy for stroke prophylaxis as well as subcutaneous Lovenox for DVT prophylaxis. Antivert was added for bouts of dizziness. Urine culture greater than 100,000 Escherichia coli placed on Cipro 01/08/2013. Physical and occupational therapy evaluations completed an ongoing with recommendations of physical medicine rehabilitation consult to consider inpatient rehabilitation services.   Review of Systems  Musculoskeletal: Positive for myalgias, back pain and joint pain.  Neurological: Positive for dizziness and weakness.       Vertigo  Psychiatric/Behavioral: Positive for depression.       Anxiety   Past Medical History  Diagnosis Date  . Hypothyroid   . Asthma   . Hypertension   . GERD (gastroesophageal reflux disease)   . Depression   . Unspecified vitamin D deficiency   . Allergic rhinitis   . Diverticulitis of colon   . Hepatitis, autoimmune   . Herpes zoster 2011  . Weight loss 6/11  . Hyperlipidemia     statins increased LFTs  . PONV (postoperative nausea and vomiting)     "years ago" (01/07/2013)  . Giant cell arteritis 2003    devashwar  . Temporal arteritis   . Daily headache     "related to temporal arteritis" (01/07/2013)  . Arthritis     "hips, hands, feet; pretty much feels like all over" (01/07/2013)  . Fibromyalgia    . Chronic lower back pain   . Anxiety   . Breast cancer ~ 2008    "right" (01/07/2013)   Past Surgical History  Procedure Laterality Date  . Appendectomy    . Cholecystectomy    . Tonsillectomy    . Abdominal hysterectomy  1988  . Dilation and curettage of uterus    . Breast biopsy Right   . Breast lumpectomy Right    Family History  Problem Relation Age of Onset  . Diabetes Mother   . Cancer Brother   . Heart disease Brother   . Cancer Sister   . Heart disease Sister     MI at age 79  . Hypertension Other   . Heart attack Mother     died of MI at age 69   Social History:  reports that she has never smoked. She has never used smokeless tobacco. She reports that she does not drink alcohol or use illicit drugs. Allergies:  Allergies  Allergen Reactions  . Celecoxib Shortness Of Breath    redness  . Codeine Nausea Only  . Atorvastatin     Elevated liver enzymes  . Rosuvastatin     Elevated liver enzymes  . Penicillins Hives   Medications Prior to Admission  Medication Sig Dispense Refill  . ALPRAZolam (XANAX) 0.5 MG tablet Take 0.5-1 tablets (0.25-0.5 mg total) by mouth 3 (three) times daily as needed for sleep or anxiety.  90 tablet  0  . azaTHIOprine (IMURAN) 50 MG tablet take 1 tablet by mouth once daily  90 tablet  3  . carvedilol (COREG) 25 MG tablet take 1 tablet by mouth twice a day  180 tablet  3  . cholecalciferol (VITAMIN D) 1000 UNITS tablet Take 1,000 Units by mouth daily.        . hydrochlorothiazide (MICROZIDE) 12.5 MG capsule take 1 capsule by mouth once daily  30 capsule  11  . levothyroxine (SYNTHROID, LEVOTHROID) 88 MCG tablet Take 1 tablet (88 mcg total) by mouth daily.  30 tablet  11  . mirtazapine (REMERON) 15 MG tablet Take 7.5 mg by mouth at bedtime.      . predniSONE (DELTASONE) 5 MG tablet Take 5 mg by mouth daily.      . hydrochlorothiazide (HYDRODIURIL) 12.5 MG tablet Take 12.5 mg by mouth daily.        Home: Home Living Lives With:  Alone Type of Home: House Home Access: Level entry Home Layout: One level Bathroom Shower/Tub: Walk-in shower;Door [employer] Toilet: Standard Home Adaptive Equipment: Grab bars in shower;Hand-held shower hose Additional Comments: pt's husband passed a year and a half ago, sister lives nearby and checks on her (at least by phone) daily. Niece coming to stay with her mid-May for a week. Lives in a townhome.  Functional History: Prior Function Able to Take Stairs?: Yes Driving: Yes Vocation: Retired Functional Status:  Mobility: Bed Mobility Bed Mobility: Rolling Right;Right Sidelying to Sit Rolling Right: 5: Supervision;With rail Right Sidelying to Sit: 5: Supervision;With rails Transfers Transfers: Sit to Stand;Stand to Sit Sit to Stand: 4: Min assist Stand to Sit: 4: Min assist Ambulation/Gait Ambulation/Gait Assistance: 4: Min guard Ambulation Distance (Feet): 100 Feet Assistive device: Rolling walker;None Ambulation/Gait Assistance Details: no AD used at first but pt unsteady and given her dizziness, gave her RW to use. This imrpoved her gait pattern and confidence and walking speed increased. She tried short walk to chair without it again at the end and became much more cautious and slower and unsteady agan Gait Pattern: Step-through pattern Gait velocity: decreased Stairs: No Wheelchair Mobility Wheelchair Mobility: No  ADL: ADL Eating/Feeding: Set up;Other (comment) (given tubing to use with R hand) Where Assessed - Eating/Feeding: Chair Grooming: Performed;Brushing hair;Minimal assistance Where Assessed - Grooming: Unsupported sitting Upper Body Bathing: Simulated;Minimal assistance Where Assessed - Upper Body Bathing: Unsupported sitting Lower Body Bathing: Simulated;Minimal assistance Where Assessed - Lower Body Bathing: Supported sit to stand Upper Body Dressing: Moderate assistance Where Assessed - Upper Body Dressing: Unsupported sitting Lower Body Dressing:  Simulated;Moderate assistance Where Assessed - Lower Body Dressing: Supported sit to stand Toilet Transfer: Minimal assistance;Simulated Statistician Method: Sit to Barista: Comfort height toilet;Grab bars Equipment Used: Gait belt;Rolling walker Transfers/Ambulation Related to ADLs: Min A for all ADL Comments: focus of session on RUE neuro rehab. Pt given theraputty and written theraputtey exercises. Sensation further assessed. Pt appears to demonstrate difficutly with proprioception. RUE. Vision also further assessed. Pt c/o blurry vision R side. Pt had difficulty with smooth saccades to R visual field. Also had difficulty with visual attnetion/fixation in R field. Pt missed targets on R during simultaneous stimulation.  Cognition: Cognition Overall Cognitive Status: Within Functional Limits for tasks assessed Arousal/Alertness: Awake/alert Orientation Level: Oriented X4 Cognition Arousal/Alertness: Awake/alert Behavior During Therapy: WFL for tasks assessed/performed Overall Cognitive Status: Within Functional Limits for tasks assessed  Blood pressure 120/48, pulse 61, temperature 97.5 F (36.4 C), temperature source Oral, resp. rate 16, height 5\' 2"  (1.575 m), weight 49.442 kg (109 lb), SpO2 98.00%. Physical Exam  Vitals reviewed.  Constitutional: She is oriented to person, place, and time.  HENT:  Head: Normocephalic.  Eyes: EOM are normal.  Neck: Normal range of motion. Neck supple. No thyromegaly present.  Cardiovascular: Normal rate and regular rhythm.   Pulmonary/Chest: Effort normal and breath sounds normal. No respiratory distress.  Abdominal: Soft. Bowel sounds are normal. She exhibits no distension.  Musculoskeletal: She exhibits no edema.  Neurological: She is alert and oriented to person, place, and time.  Patient follows 3 step commands  Skin: Skin is warm and dry.  Psychiatric: She has a normal mood and affect.    No results found for  this or any previous visit (from the past 24 hour(s)). Dg Chest 2 View  01/08/2013  *RADIOLOGY REPORT*  Clinical Data: Left cerebral infarct.  Right hand weakness.  CHEST - 2 VIEW  Comparison: 04/03/2010  Findings: Upper limits normal heart size noted. There is no evidence of focal airspace disease, pulmonary edema, suspicious pulmonary nodule/mass, pleural effusion, or pneumothorax. No acute bony abnormalities are identified.  IMPRESSION: Upper limits normal heart size without evidence of acute cardiopulmonary disease.   Original Report Authenticated By: Harmon Pier, M.D.    Mr Cervical Spine Wo Contrast  01/08/2013  *RADIOLOGY REPORT*  Clinical Data: Neck pain.  Stroke.  MRI CERVICAL SPINE WITHOUT CONTRAST  Technique:  Multiplanar and multiecho pulse sequences of the cervical spine, to include the craniocervical junction and cervicothoracic junction, were obtained according to standard protocol without intravenous contrast.  Comparison: None  Findings: Normal cervical alignment.  Negative for fracture or mass lesion.  The spinal cord signal is normal.  No cord compression.  C2-3:  Small central disc protrusion not touching the cord  C3-4:  Negative  C4-5:  Mild disc space narrowing without disc protrusion or stenosis  C5-6:  Negative  C6-7:  Small central disc protrusion not touching the cord  C7-T1:  Slight anterior slip without stenosis.  IMPRESSION: Mild cervical degenerative changes.  No stenosis or neural impingement.   Original Report Authenticated By: Janeece Riggers, M.D.     Assessment/Plan: Diagnosis: left parietal, thrombotic infarct with right HP with previous RA 1. Does the need for close, 24 hr/day medical supervision in concert with the patient's rehab needs make it unreasonable for this patient to be served in a less intensive setting? Yes 2. Co-Morbidities requiring supervision/potential complications: htn, depression breast cancer 3. Due to bladder management, bowel management, safety,  skin/wound care, disease management, medication administration, pain management and patient education, does the patient require 24 hr/day rehab nursing? Yes 4. Does the patient require coordinated care of a physician, rehab nurse, PT (1-2 hrs/day, 5 days/week) and OT (1-2 hrs/day, 5 days/week) to address physical and functional deficits in the context of the above medical diagnosis(es)? Yes Addressing deficits in the following areas: balance, endurance, locomotion, strength, transferring, bowel/bladder control, bathing, dressing, feeding, grooming, toileting and psychosocial support 5. Can the patient actively participate in an intensive therapy program of at least 3 hrs of therapy per day at least 5 days per week? Yes 6. The potential for patient to make measurable gains while on inpatient rehab is excellent 7. Anticipated functional outcomes upon discharge from inpatient rehab are mod I with PT, mod I to set up with OT, n/a with SLP. 8. Estimated rehab length of stay to reach the above functional goals is: 7-10 days 9. Does the patient have adequate social supports to accommodate these discharge functional goals? Yes 10. Anticipated D/C setting: Home 11. Anticipated post D/C  treatments: HH therapy 12. Overall Rehab/Functional Prognosis: excellent  RECOMMENDATIONS: This patient's condition is appropriate for continued rehabilitative care in the following setting: CIR Patient has agreed to participate in recommended program. Yes Note that insurance prior authorization may be required for reimbursement for recommended care.  Comment:Rehab RN to follow up.   Ranelle Oyster, MD, [place] [facility]     01/10/2013

## 2013-01-10 NOTE — PMR Pre-admission (Signed)
PMR Admission Coordinator Pre-Admission Assessment  Patient: Michele Diaz is an 79 y.o., female MRN: [id number] DOB: 22-Aug-1932 Height: 5\' 2"  (157.5 cm) Weight: 49.442 kg (109 lb)              Insurance Information Primary: Medicare Policy#: [id number] Subscriber: Patient Benefits: [employer] Effective Date: 08/16/1999 Deductible: $1126 OOP Max: None Life Max: Unlimited CIR: 100% SNF: 100 days LBD:  Outpatient: 80% Copay: 20%  Home Health: 100% DME: 80%  Copay: 20% Providers: Patient's choice  SECONDARY:[employer] BC[employer]   Policy#: [id number]      Subscriber: self Phone#: [phone]     Emergency Contact Information Contact Information   Name Relation Home Work Mobile   Mantia,James Son [phone]     Catha Gosselin [phone]       Current Medical History  Patient Admitting Diagnosis: Left parietal, thrombotic infarct with right HP with previous RA  History of Present Illness: 79 y.o. female with history of rheumatoid arthritis, hypertension, breast cancer with lumpectomy and radiation therapy. Admitted 01/07/2013 with right-sided weakness x2-3 days as well as bouts of vertigo. MRI of the brain showed small acute infarct left parietal white matter. MRA of the head mild stenosis of the parietal branch of the left middle cerebral artery. Echocardiogram with ejection fraction of 60% without emboli. Carotid Dopplers with no ICA stenosis. Patient did not receive TPA. Neurology services consulted placed on Plavix therapy for stroke prophylaxis as well as subcutaneous Lovenox for DVT prophylaxis. Antivert was added for bouts of dizziness. Urine culture greater than 100,000 Escherichia coli placed on Cipro 01/08/2013.  Total: 0   Past Medical History  Past Medical History  Diagnosis Date  . Hypothyroid   . Asthma   . Hypertension   . GERD (gastroesophageal reflux disease)   . Depression   . Unspecified vitamin D deficiency   . Allergic rhinitis   . Diverticulitis of colon   . Hepatitis,  autoimmune   . Herpes zoster 2011  . Weight loss 6/11  . Hyperlipidemia     statins increased LFTs  . PONV (postoperative nausea and vomiting)     "years ago" (01/07/2013)  . Giant cell arteritis 2003    devashwar  . Temporal arteritis   . Daily headache     "related to temporal arteritis" (01/07/2013)  . Arthritis     "hips, hands, feet; pretty much feels like all over" (01/07/2013)  . Fibromyalgia   . Chronic lower back pain   . Anxiety   . Breast cancer ~ 2008    "right" (01/07/2013)   Family History  family history includes Cancer in her brother and sister; Diabetes in her mother; Heart attack in her mother; Heart disease in her brother and sister; and Hypertension in her other.  Prior Rehab/Hospitalizations: none  Current Medications  Current facility-administered medications:ALPRAZolam (XANAX) tablet 0.25-0.5 mg, 0.25-0.5 mg, Oral, TID PRN, Nishant Dhungel, MD, 0.25 mg at 01/08/13 2148;  azaTHIOprine (IMURAN) tablet 50 mg, 50 mg, Oral, Daily, Nishant Dhungel, MD, 50 mg at 01/10/13 0946;  carvedilol (COREG) tablet 25 mg, 25 mg, Oral, BID WC, Ripudeep K Rai, MD cholecalciferol (VITAMIN D) tablet 1,000 Units, 1,000 Units, Oral, Daily, Nishant Dhungel, MD, 1,000 Units at 01/10/13 0946;  ciprofloxacin (CIPRO) tablet 250 mg, 250 mg, Oral, BID, Ripudeep K Rai, MD, 250 mg at 01/10/13 0746;  clopidogrel (PLAVIX) tablet 75 mg, 75 mg, Oral, Q breakfast, Micki Riley, MD, 75 mg at 01/10/13 0746;  enoxaparin (LOVENOX) injection 40 mg, 40 mg, Subcutaneous, Q24H,  Nishant Dhungel, MD, 40 mg at 01/09/13 2112 levothyroxine (SYNTHROID, LEVOTHROID) tablet 88 mcg, 88 mcg, Oral, Q breakfast, Nishant Dhungel, MD, 88 mcg at 01/10/13 0746;  meclizine (ANTIVERT) tablet 12.5 mg, 12.5 mg, Oral, BID, Ripudeep K Rai, MD, 12.5 mg at 01/10/13 0946;  mirtazapine (REMERON) tablet 7.5 mg, 7.5 mg, Oral, QHS, Nishant Dhungel, MD, 7.5 mg at 01/09/13 2111;  pantoprazole (PROTONIX) EC tablet 40 mg, 40 mg, Oral, Q1200,  Ripudeep K Rai, MD, 40 mg at 01/09/13 1156 predniSONE (DELTASONE) tablet 5 mg, 5 mg, Oral, Q breakfast, Nishant Dhungel, MD, 5 mg at 01/10/13 0746;  senna-docusate (Senokot-S) tablet 1 tablet, 1 tablet, Oral, QHS PRN, Eddie North, MD  Patients Current Diet: Cardiac  Precautions / Restrictions Precautions Precautions: Fall Precaution Comments: ? BPVV? (R inattention) Restrictions Weight Bearing Restrictions: No   Prior Activity Level Community (5-7x/wk): Active daily Home Assistive Devices / Equipment Home Assistive Devices/Equipment: Grab bars in shower;Hand-held shower hose Home Adaptive Equipment: Grab bars in shower;Hand-held shower hose  Prior Functional Level Prior Function Level of Independence: Independent Able to Take Stairs?: Yes Driving: Yes Vocation: Retired  Current Functional Level Cognition  Arousal/Alertness: Awake/alert Overall Cognitive Status: Within Functional Limits for tasks assessed Orientation Level: Oriented X4    Extremity Assessment (includes Sensation/Coordination)  RUE ROM/Strength/Tone: Deficits RUE ROM/Strength/Tone Deficits: decreased strength compared to left, lag with shoulder flexion compared to LUE, diminished speed for palm up/down compared to LUE RUE Coordination: Deficits RUE Coordination Deficits: dysmetria noted with finger nose, decreased thumb to finger opposition  RLE ROM/Strength/Tone: Deficits RLE ROM/Strength/Tone Deficits: knee ext 3+/5, knee flex 3+/5, hip flex 3/5 RLE Sensation: WFL - Light Touch RLE Coordination: Deficits RLE Coordination Deficits: some ataxia noted with MMT    ADLs  Eating/Feeding: Set up;Other (comment) (given tubing to use with R hand) Where Assessed - Eating/Feeding: Chair Grooming: Performed;Brushing hair;Minimal assistance Where Assessed - Grooming: Unsupported sitting Upper Body Bathing: Simulated;Minimal assistance Where Assessed - Upper Body Bathing: Unsupported sitting Lower Body  Bathing: Simulated;Minimal assistance Where Assessed - Lower Body Bathing: Supported sit to stand Upper Body Dressing: Moderate assistance Where Assessed - Upper Body Dressing: Unsupported sitting Lower Body Dressing: Simulated;Moderate assistance Where Assessed - Lower Body Dressing: Supported sit to stand Toilet Transfer: Minimal assistance;Simulated Statistician Method: Sit to Barista: Comfort height toilet;Grab bars Toileting - Clothing Manipulation and Hygiene: Simulated;Moderate assistance Where Assessed - Toileting Clothing Manipulation and Hygiene: Standing Equipment Used: Gait belt;Rolling walker Transfers/Ambulation Related to ADLs: Min A for all ADL Comments: focus of session on RUE neuro rehab. Pt given theraputty and written theraputtey exercises. Sensation further assessed. Pt appears to demonstrate difficutly with proprioception. RUE. Vision also further assessed. Pt c/o blurry vision R side. Pt had difficulty with smooth saccades to R visual field. Also had difficulty with visual attnetion/fixation in R field. Pt missed targets on R during simultaneous stimulation.    Mobility  Bed Mobility: Rolling Right;Right Sidelying to Sit Rolling Right: 5: Supervision;With rail Right Sidelying to Sit: 5: Supervision;With rails    Transfers  Transfers: Sit to Stand;Stand to Sit Sit to Stand: 4: Min assist Stand to Sit: 4: Min assist    Ambulation / Gait / Stairs / Psychologist, prison and probation services  Ambulation/Gait Ambulation/Gait Assistance: 4: Min Government social research officer (Feet): 100 Feet Assistive device: Rolling walker;None Ambulation/Gait Assistance Details: no AD used at first but pt unsteady and given her dizziness, gave her RW to use. This imrpoved her gait pattern and confidence and walking speed increased. She  tried short walk to chair without it again at the end and became much more cautious and slower and unsteady agan Gait Pattern: Step-through  pattern Gait velocity: decreased Stairs: No Wheelchair Mobility Wheelchair Mobility: No    Posture / Balance Dynamic Standing Balance Dynamic Standing - Balance Support: During functional activity;No upper extremity supported Dynamic Standing - Level of Assistance: 4: Min assist    Special needs/care consideration Skin: WDL Bowel mgmt:Patient reports no BM since admission Bladder mgmt: E-coli UTI, on Cipro 4/26 Safety: pt reports vertigo    Previous Home Environment Living Arrangements: Lives alone Type of Home: House Home Layout: One level Home Access: Level entry Bathroom Shower/Tub: Walk-in shower;Door Firefighter: Standard Home Care Services: No Additional Comments: pt's husband passed a year and a half ago, sister lives nearby and checks on her (at least by phone) daily. Niece coming to stay with her mid-May for a week. Lives in a townhome.  Discharge Living Setting Plans for Discharge Living Setting: Alone Type of Home at Discharge: Townhouse Discharge Home Layout: One level Discharge Home Access: Stairs to enter Entrance Stairs-Rails: None Entrance Stairs-Number of Steps: 1 Discharge Bathroom Shower/Tub: Walk-in shower Discharge Bathroom Toilet: Standard Discharge Bathroom Accessibility: Yes How Accessible: Accessible via walker Do you have any problems obtaining your medications?: No  Social/Family/Support Systems Patient Roles: Parent, grandmother, sister. Patient widowed 1 year ago. Contact Information: [phone] Anticipated Caregiver: Modified independent goals. Sister lives very nearby. (Pt has 2 sons in [place]. Pt's grandaughter is an Charity fundraiser on 3rd floor.) Anticipated Caregiver's Contact Information: Concha Se (Sister) [phone] Caregiver Availability: Intermittent Discharge Plan Discussed with Primary Caregiver: Yes Is Caregiver In Agreement with Plan?: Yes Does Caregiver/Family have Issues with Lodging/Transportation while Pt is in Rehab?:  No  Goals/Additional Needs Patient/Family Goal for Rehab: Modified independent Expected length of stay: 7-10 days Cultural Considerations: none Dietary Needs:Heart healthy Equipment Needs: TBD Pt/Family Agrees to Admission and willing to participate: Yes Program Orientation Provided & Reviewed with Pt/Caregiver Including Roles  & Responsibilities: Yes   Decrease burden of Care through IP rehab admission: n/a   Possible need for SNF placement upon discharge: no   Patient Condition: This patient's condition remains as documented in the consult dated 01/10/13, in which the Rehabilitation Physician determined and documented that the patient's condition is appropriate for intensive rehabilitative care in an inpatient rehabilitation facility. Will admit to inpatient rehab today.  Preadmission Screen Completed By:  Meryl Dare, 01/10/2013 11:45 AM ______________________________________________________________________   Discussed status with Dr. Riley Kill on 01/10/13 at 1159 and received telephone approval for admission today.  Admission Coordinator:  Meryl Dare, time 1159/Date 01/10/13

## 2013-01-10 NOTE — Discharge Summary (Signed)
Physician Discharge Summary  Patient ID: Michele Diaz MRN: [id number] DOB/AGE: [birth year] 79 y.o.  Admit date: 01/07/2013 Discharge date: 01/10/2013  Primary Care Physician:  [name], MD  Discharge Diagnoses:     Acute infarct in left parietal white matter with right hand weakness  . HYPOTHYROIDISM . DEPRESSION/ANXIETY . HYPERTENSION . GERD . VERTIGO    Escherichia coli UTI  Consults:  Neurology/stroke service                    Inpatient rehabilitation   Inpatient medications to be continued on final discharge: Plavix 75 mg daily Meclizine 12.5 mg BID Restart HCTZ 12.5 mg daily if BP allows Ciprofloxacin 250 mg twice a day for another 4 days   Discharge Medications:   Medication List    TAKE these medications       ALPRAZolam 0.5 MG tablet  Commonly known as:  XANAX  Take 0.5-1 tablets (0.25-0.5 mg total) by mouth 3 (three) times daily as needed for sleep or anxiety.     azaTHIOprine 50 MG tablet  Commonly known as:  IMURAN  take 1 tablet by mouth once daily     carvedilol 25 MG tablet  Commonly known as:  COREG  take 1 tablet by mouth twice a day     cholecalciferol 1000 UNITS tablet  Commonly known as:  VITAMIN D  Take 1,000 Units by mouth daily.     hydrochlorothiazide 12.5 MG tablet  Commonly known as:  HYDRODIURIL  Take 12.5 mg by mouth daily.     hydrochlorothiazide 12.5 MG capsule  Commonly known as:  MICROZIDE  take 1 capsule by mouth once daily     levothyroxine 88 MCG tablet  Commonly known as:  SYNTHROID, LEVOTHROID  Take 1 tablet (88 mcg total) by mouth daily.     mirtazapine 15 MG tablet  Commonly known as:  REMERON  Take 7.5 mg by mouth at bedtime.     predniSONE 5 MG tablet  Commonly known as:  DELTASONE  Take 5 mg by mouth daily.         Brief H and P: For complete details please refer to admission H and P, but in brief 79 year old female with history of rheumatoid arthritis, hypertension, GERD, giant cell arteritis,  hypothyroidism, hyperlipidemia, history of breast cancer status post lumpectomy and radiation who presented to the ED with symptoms of increasing weakness in her right arm and hand for 2-3 days prior to admission. Patient reported that for past 6 weeks she had been having vertigo on standing and ambulating. For last 4-5 days prior to admission, she had unsteady gait as well. For last 2 days she had noticed increased to weakness in her right arm and hand with poor finger grasp. As the symptoms were persistent she presented to the ED.  Patient denied any headache, blurred vision, fever, chills, chest pain, palpitations, shortness of breath, abdominal pain, nausea, vomiting, bowel or urinary symptoms. She denied similar symptoms in the past. She denies any tingling or numbness of her extremities.  In the ED she was noted to have significant weakness in her right arm and hand. Head CT was done which was unremarkable for acute event.[facility] hospitalist called for admission for possible stroke. Given duration of symptoms over past 2 days, no code stroke was called.   Hospital Course:   Stroke: Presenting symptoms with right hand weakness. Patient was admitted for full stroke workup as below.  - MRI brain showed small acute infarct  left parietal white matter, chronic microvascular ischemia  - MRA showed mild stenosis of the parietal branch of left middle cerebral artery  - 2-D echo showed EF of 60%, normal wall motion  - carotid Dopplers- no ICA stenosis, vertebral artery flow is antegrade  - PTOT recommended inpatient rehabilitation and patient was accepted by inpatient rehabilitation - placed on Plavix by neurology. Not statin candidate as the LDL below 100 and history of statin intolerance.  - She has been tolerating diet without any difficulty  Neck pain: Patient also reported right-sided and posterior neck pain for the last 2-3 weeks  - MRI of the cervical spine was negative for any nerve root  encroachment, only showed mild degenerative changes   Escherichia coli UTI:  - Placed on ciprofloxacin (has penicillin allergy), per sensitivities okay on ciprofloxacin   HYPOTHYROIDISM: Continue Synthroid   DEPRESSION/ANXIETY: Continue Xanax as needed   HYPERTENSION: I have restarted Coreg, add HCTZ as BP allows and dizziness improves  GERD: continue PPI   VERTIGO: likely secondary to acute CVA, meclizine added  BREAST CANCER, HX OF   RHEUMATOID ARTHRITIS : Continue prednisone, Imuran    Day of Discharge BP 149/62  Pulse 61  Temp(Src) 97.6 F (36.4 C) (Oral)  Resp 18  Ht 5\' 2"  (1.575 m)  Wt 49.442 kg (109 lb)  BMI 19.93 kg/m2  SpO2 98%  Physical Exam: General: Alert and awake oriented x3 not in any acute distress. HEENT: anicteric sclera, pupils reactive to light and accommodation CVS: S1-S2 clear no murmur rubs or gallops Chest: clear to auscultation bilaterally, no wheezing rales or rhonchi Abdomen: soft nontender, nondistended, normal bowel sounds Extremities: no cyanosis, clubbing or edema noted bilaterally Neuro: Cranial nerves II-XII intact, no dysarthria, difficulty with heel-to-shin on the right, still right hand weakness present  The results of significant diagnostics from this hospitalization (including imaging, microbiology, ancillary and laboratory) are listed below for reference.    LAB RESULTS: Basic Metabolic Panel:  Recent Labs Lab 01/07/13 1322 01/07/13 1407  NA 140 142  K 3.5 3.5  CL 102 101  CO2 30  --   GLUCOSE 90 90  BUN 12 12  CREATININE 0.69 0.70  CALCIUM 9.7  --    Liver Function Tests:  Recent Labs Lab 01/07/13 1322  AST 27  ALT 10  ALKPHOS 78  BILITOT 0.9  PROT 8.1  ALBUMIN 3.5   CBC:  Recent Labs Lab 01/07/13 1322 01/07/13 1407  WBC 4.6  --   NEUTROABS 3.3  --   HGB 11.9* 12.2  HCT 34.3* 36.0  MCV 87.9  --   PLT 123*  --    Cardiac Enzymes:  Recent Labs Lab 01/07/13 1329  TROPONINI <0.30   BNP: No  components found with this basename: POCBNP,  CBG:  Recent Labs Lab 01/07/13 1406  GLUCAP 102*    Significant Diagnostic Studies:  Dg Chest 2 View  01/08/2013.  IMPRESSION: Upper limits normal heart size without evidence of acute cardiopulmonary disease.   Original Report Authenticated By: Harmon Pier, M.D.    Ct Head Wo Contrast  01/07/2013    IMPRESSION: Chronic microvascular ischemia.  No acute abnormality.   Original Report Authenticated By: Janeece Riggers, M.D.    Mr Brain Wo Contrast  01/07/2013   IMPRESSION: Small acute infarct left parietal white matter.  Chronic microvascular ischemia.  Mild chronic micro hemorrhage in the brain most likely due to chronic hypertension.    Mr Cervical Spine Wo Contrast  01/08/2013  IMPRESSION: Mild cervical degenerative changes.  No stenosis or neural impingement.   Original Report Authenticated By: Janeece Riggers, M.D.    Mr Mra Head/brain Wo Cm  01/07/2013   IMPRESSION:   Mild stenosis of the parietal branch of the left middle cerebral artery.   Original Report Authenticated By: Janeece Riggers, M.D.     2D ECHO: Study Conclusions  - Left ventricle: Technically limited, but adequate study. The cavity size was normal. Wall thickness was normal. The estimated ejection fraction was 60%. Wall motion was normal; there were no regional wall motion abnormalities. - Right ventricle: Poorly visualized. The cavity size was normal. Systolic function was normal.    Disposition and Follow-up:      Future Appointments Provider Department Dept Phone   01/21/2013 1:30 PM Tresa Garter, MD [facility] [phone]   03/07/2013 1:30 PM Robyne Askew, MD [facility], [place] [phone]       DISPOSITION: Inpatient rehabilitation DIET: Heart healthy diet ACTIVITY: As tolerated   DISCHARGE FOLLOW-UP Follow-up Information   Follow up with [name], MD. Schedule an appointment as soon as possible for a  visit in 2 weeks.   Contact information:   [address] [place] [address] [phone]       Follow up with Gates Rigg, MD. Schedule an appointment as soon as possible for a visit in 2 months. (stroke clinic)    Contact information:   [address] [place] [address] [phone]       Time spent on Discharge: 35 minutes  Signed:   RAI,RIPUDEEP M.D. [facility] 01/10/2013, 11:22 AM Pager: [phone]

## 2013-01-10 NOTE — H&P (Signed)
Physical Medicine and Rehabilitation Admission H&P      Chief Complaint   Patient presents with   .  Extremity Weakness       R arm weakness    : HPI: Michele Diaz is a 79 y.o. female with history of rheumatoid arthritis, hypertension, breast cancer with lumpectomy and radiation therapy. Admitted 01/07/2013 with right-sided weakness x2-3 days as well as bouts of vertigo. MRI of the brain showed small acute infarct left parietal white matter. MRA of the head mild stenosis of the parietal branch of the left middle cerebral artery. Echocardiogram with ejection fraction of 60% without emboli. Carotid Dopplers with no ICA stenosis. Patient did not receive TPA. Neurology services consulted placed on Plavix therapy for stroke prophylaxis as well as subcutaneous Lovenox for DVT prophylaxis. Antivert was added for bouts of dizziness. Urine culture greater than 100,000 Escherichia coli placed on Cipro 01/08/2013. Physical and occupational therapy evaluations completed an ongoing with recommendations of physical medicine rehabilitation consult to consider inpatient rehabilitation services. Patient was felt to be a candidate for inpatient rehabilitation services and was admitted for a comprehensive rehabilitation program today.   Review of Systems   Musculoskeletal: Positive for myalgias, back pain and joint pain.   Neurological: Positive for dizziness and weakness.   Vertigo   Psychiatric/Behavioral: Positive for depression.   Anxiety       Past Medical History   Diagnosis  Date   .  Hypothyroid     .  Asthma     .  Hypertension     .  GERD (gastroesophageal reflux disease)     .  Depression     .  Unspecified vitamin D deficiency     .  Allergic rhinitis     .  Diverticulitis of colon     .  Hepatitis, autoimmune     .  Herpes zoster  2011   .  Weight loss  6/11   .  Hyperlipidemia         statins increased LFTs   .  PONV (postoperative nausea and vomiting)         "years ago" (01/07/2013)    .  Giant cell arteritis  2003       devashwar   .  Temporal arteritis     .  Daily headache         "related to temporal arteritis" (01/07/2013)   .  Arthritis         "hips, hands, feet; pretty much feels like all over" (01/07/2013)   .  Fibromyalgia     .  Chronic lower back pain     .  Anxiety     .  Breast cancer  ~ 2008       "right" (01/07/2013)    Past Surgical History   Procedure  Laterality  Date   .  Appendectomy       .  Cholecystectomy       .  Tonsillectomy       .  Abdominal hysterectomy    1988   .  Dilation and curettage of uterus       .  Breast biopsy  Right     .  Breast lumpectomy  Right      Family History   Problem  Relation  Age of Onset   .  Diabetes  Mother     .  Cancer  Brother     .  Heart disease  Brother     .  Cancer  Sister     .  Heart disease  Sister         MI at age 65   .  Hypertension  Other     .  Heart attack  Mother         died of MI at age 58    Social History: reports that she has never smoked. She has never used smokeless tobacco. She reports that she does not drink alcohol or use illicit drugs. Allergies:  Allergies   Allergen  Reactions   .  Celecoxib  Shortness Of Breath       redness   .  Codeine  Nausea Only   .  Atorvastatin         Elevated liver enzymes   .  Rosuvastatin         Elevated liver enzymes   .  Penicillins  Hives    Medications Prior to Admission   Medication  Sig  Dispense  Refill   .  ALPRAZolam (XANAX) 0.5 MG tablet  Take 0.5-1 tablets (0.25-0.5 mg total) by mouth 3 (three) times daily as needed for sleep or anxiety.   90 tablet   0   .  azaTHIOprine (IMURAN) 50 MG tablet  take 1 tablet by mouth once daily   90 tablet   3   .  carvedilol (COREG) 25 MG tablet  take 1 tablet by mouth twice a day   180 tablet   3   .  cholecalciferol (VITAMIN D) 1000 UNITS tablet  Take 1,000 Units by mouth daily.           .  hydrochlorothiazide (MICROZIDE) 12.5 MG capsule  take 1 capsule by mouth once daily   30  capsule   11   .  levothyroxine (SYNTHROID, LEVOTHROID) 88 MCG tablet  Take 1 tablet (88 mcg total) by mouth daily.   30 tablet   11   .  mirtazapine (REMERON) 15 MG tablet  Take 7.5 mg by mouth at bedtime.         .  predniSONE (DELTASONE) 5 MG tablet  Take 5 mg by mouth daily.         .  hydrochlorothiazide (HYDRODIURIL) 12.5 MG tablet  Take 12.5 mg by mouth daily.            Home: Home Living Lives With: Alone Type of Home: House Home Access: Level entry Home Layout: One level Bathroom Shower/Tub: Walk-in shower;Door [employer] Toilet: Standard Home Adaptive Equipment: Grab bars in shower;Hand-held shower hose Additional Comments: pt's husband passed a year and a half ago, sister lives nearby and checks on her (at least by phone) daily. Niece coming to stay with her mid-May for a week. Lives in a townhome.    Functional History: Prior Function Able to Take Stairs?: Yes Driving: Yes Vocation: Retired   Functional Status:   Mobility: Bed Mobility Bed Mobility: Rolling Right;Right Sidelying to Sit Rolling Right: 5: Supervision;With rail Right Sidelying to Sit: 5: Supervision;With rails Transfers Transfers: Sit to Stand;Stand to Sit Sit to Stand: 4: Min assist Stand to Sit: 4: Min assist Ambulation/Gait Ambulation/Gait Assistance: 4: Min guard Ambulation Distance (Feet): 100 Feet Assistive device: Rolling walker;None Ambulation/Gait Assistance Details: no AD used at first but pt unsteady and given her dizziness, gave her RW to use. This imrpoved her gait pattern and confidence and walking speed increased. She tried short walk to chair without it  again at the end and became much more cautious and slower and unsteady agan Gait Pattern: Step-through pattern Gait velocity: decreased Stairs: No Wheelchair Mobility Wheelchair Mobility: No   ADL: ADL Eating/Feeding: Set up;Other (comment) (given tubing to use with R hand) Where Assessed - Eating/Feeding: Chair Grooming:  Performed;Brushing hair;Minimal assistance Where Assessed - Grooming: Unsupported sitting Upper Body Bathing: Simulated;Minimal assistance Where Assessed - Upper Body Bathing: Unsupported sitting Lower Body Bathing: Simulated;Minimal assistance Where Assessed - Lower Body Bathing: Supported sit to stand Upper Body Dressing: Moderate assistance Where Assessed - Upper Body Dressing: Unsupported sitting Lower Body Dressing: Simulated;Moderate assistance Where Assessed - Lower Body Dressing: Supported sit to stand Toilet Transfer: Minimal assistance;Simulated Statistician Method: Sit to Barista: Comfort height toilet;Grab bars Equipment Used: Gait belt;Rolling walker Transfers/Ambulation Related to ADLs: Min A for all ADL Comments: focus of session on RUE neuro rehab. Pt given theraputty and written theraputtey exercises. Sensation further assessed. Pt appears to demonstrate difficutly with proprioception. RUE. Vision also further assessed. Pt c/o blurry vision R side. Pt had difficulty with smooth saccades to R visual field. Also had difficulty with visual attnetion/fixation in R field. Pt missed targets on R during simultaneous stimulation.   Cognition: Cognition Overall Cognitive Status: Within Functional Limits for tasks assessed Arousal/Alertness: Awake/alert Orientation Level: Oriented X4 Cognition Arousal/Alertness: Awake/alert Behavior During Therapy: WFL for tasks assessed/performed Overall Cognitive Status: Within Functional Limits for tasks assessed   Physical Exam: Blood pressure 149/62, pulse 61, temperature 97.6 F (36.4 C), temperature source Oral, resp. rate 18, height 5\' 2"  (1.575 m), weight 49.442 kg (109 lb), SpO2 98.00%. Physical Exam  Vitals reviewed.   Constitutional: She is oriented to person, place, and time.   HENT:   Head: Normocephalic.   Eyes: EOM are normal.   Neck: Normal range of motion. Neck supple. No thyromegaly present.    Cardiovascular: Normal rate and regular rhythm.  No murmurs or gallops Pulmonary/Chest: Effort normal and breath sounds normal. No respiratory distress.  No wheezes or rales Abdominal: Soft. Bowel sounds are normal. She exhibits no distension.  Musculoskeletal: She exhibits no edema. RA changes in bilateral hands, knees Neurological: She is alert and oriented to person, place, and time.  Patient follows 3 step commands  Right upper extremity 3+ to 4/5 with impaired FMC. RLE is 4 to 4+/5 with decreased FMC. No gross sensory deficits. CN exam normal. Speech clear. Cognition appropriate.  Skin: Skin is warm and dry.  Psychiatric: She has a normal mood and affect. Thought process a little tangential. Very pleasant     No results found for this or any previous visit (from the past 48 hour(s)). Dg Chest 2 View   01/08/2013  *RADIOLOGY REPORT*  Clinical Data: Left cerebral infarct.  Right hand weakness.  CHEST - 2 VIEW  Comparison: 04/03/2010  Findings: Upper limits normal heart size noted. There is no evidence of focal airspace disease, pulmonary edema, suspicious pulmonary nodule/mass, pleural effusion, or pneumothorax. No acute bony abnormalities are identified.  IMPRESSION: Upper limits normal heart size without evidence of acute cardiopulmonary disease.   Original Report Authenticated By: Harmon Pier, M.D.     Mr Cervical Spine Wo Contrast   01/08/2013  *RADIOLOGY REPORT*  Clinical Data: Neck pain.  Stroke.  MRI CERVICAL SPINE WITHOUT CONTRAST  Technique:  Multiplanar and multiecho pulse sequences of the cervical spine, to include the craniocervical junction and cervicothoracic junction, were obtained according to standard protocol without intravenous contrast.  Comparison: None  Findings: Normal cervical alignment.  Negative for fracture or mass lesion.  The spinal cord signal is normal.  No cord compression.  C2-3:  Small central disc protrusion not touching the cord  C3-4:  Negative  C4-5:   Mild disc space narrowing without disc protrusion or stenosis  C5-6:  Negative  C6-7:  Small central disc protrusion not touching the cord  C7-T1:  Slight anterior slip without stenosis.  IMPRESSION: Mild cervical degenerative changes.  No stenosis or neural impingement.   Original Report Authenticated By: Janeece Riggers, M.D.      Post Admission Physician Evaluation: Functional deficits secondary  to thrombotic left parietal infarct. Patient is admitted to receive collaborative, interdisciplinary care between the physiatrist, rehab nursing staff, and therapy team. Patient's level of medical complexity and substantial therapy needs in context of that medical necessity cannot be provided at a lesser intensity of care such as a SNF. Patient has experienced substantial functional loss from his/her baseline which was documented above under the "Functional History" and "Functional Status" headings.  Judging by the patient's diagnosis, physical exam, and functional history, the patient has potential for functional progress which will result in measurable gains while on inpatient rehab.  These gains will be of substantial and practical use upon discharge  in facilitating mobility and self-care at the household level. Physiatrist will provide 24 hour management of medical needs as well as oversight of the therapy plan/treatment and provide guidance as appropriate regarding the interaction of the two. 24 hour rehab nursing will assist with bladder management, bowel management, safety, skin/wound care, disease management, medication administration and patient education  and help integrate therapy concepts, techniques,education, etc. PT will assess and treat for/with: Lower extremity strength, range of motion, stamina, balance, functional mobility, safety, adaptive techniques and equipment, NMR, education.   Goals are: mod I. OT will assess and treat for/with: ADL's, functional mobility, safety, upper extremity  strength, adaptive techniques and equipment, NMR, education.   Goals are: mod I to set ups. SLP will assess and treat for/with: n/a.  Goals are: n/a. Case Management and Social Worker will assess and treat for psychological issues and discharge planning. Team conference will be held weekly to assess progress toward goals and to determine barriers to discharge. Patient will receive at least 3 hours of therapy per day at least 5 days per week. ELOS: 7-10 days      Prognosis:  excellent     Medical Problem List and Plan: 1. Left parietal, thrombotic infarct   2. DVT Prophylaxis/Anticoagulation: Subcutaneous Lovenox. Monitor platelet counts any signs of bleeding 3. Pain Management: Tylenol 4. Mood: Remeron 7.5 mg each bedtime, Xanax 0.25-0.5 mg 3 times a day as needed. Provide emotional support and positive reinforcement 5. Neuropsych: This patient is capable of making decisions on his/her own behalf. 6. Rheumatoid arthritis. Prednisone 5 mg daily, Imuran 50 mg daily. Monitor for any signs of flareup 7. E. coli urine tract infection. Cipro initiated 01/08/2013. 8. Hypothyroidism. Synthroid 9. GERD. Protonix 10. Vertigo. Antivert 12.5 mg twice a day and will titrate as needed. May consider vestibular evaluation  Ranelle Oyster, MD, [place] [facility]  01/10/2013

## 2013-01-10 NOTE — Progress Notes (Signed)
Patient received from 4N.  Patient alert and oriented x4.  Vitals stable.  Denies pain.  Patient oriented to room and unit.  Will continue to monitor.

## 2013-01-11 ENCOUNTER — Inpatient Hospital Stay (HOSPITAL_COMMUNITY): Payer: Medicare Other | Admitting: Physical Therapy

## 2013-01-11 ENCOUNTER — Inpatient Hospital Stay (HOSPITAL_COMMUNITY): Payer: Medicare Other | Admitting: Occupational Therapy

## 2013-01-11 DIAGNOSIS — I6330 Cerebral infarction due to thrombosis of unspecified cerebral artery: Secondary | ICD-10-CM

## 2013-01-11 LAB — CBC WITH DIFFERENTIAL/PLATELET
Basophils Absolute: 0 10*3/uL (ref 0.0–0.1)
Basophils Relative: 0 % (ref 0–1)
Eosinophils Absolute: 0.2 10*3/uL (ref 0.0–0.7)
Hemoglobin: 12.3 g/dL (ref 12.0–15.0)
MCH: 29.9 pg (ref 26.0–34.0)
MCHC: 34.6 g/dL (ref 30.0–36.0)
Monocytes Relative: 9 % (ref 3–12)
Neutro Abs: 3.9 10*3/uL (ref 1.7–7.7)
Neutrophils Relative %: 67 % (ref 43–77)
Platelets: 124 10*3/uL — ABNORMAL LOW (ref 150–400)
RDW: 13.6 % (ref 11.5–15.5)

## 2013-01-11 LAB — COMPREHENSIVE METABOLIC PANEL
ALT: 8 U/L (ref 0–35)
AST: 21 U/L (ref 0–37)
Albumin: 3.3 g/dL — ABNORMAL LOW (ref 3.5–5.2)
Alkaline Phosphatase: 58 U/L (ref 39–117)
BUN: 13 mg/dL (ref 6–23)
Chloride: 101 mEq/L (ref 96–112)
Potassium: 3.7 mEq/L (ref 3.5–5.1)
Sodium: 137 mEq/L (ref 135–145)
Total Bilirubin: 0.8 mg/dL (ref 0.3–1.2)
Total Protein: 8.1 g/dL (ref 6.0–8.3)

## 2013-01-11 NOTE — Evaluation (Addendum)
Occupational Therapy Assessment and Plan  Patient Details  Name: Michele Diaz MRN: [id number] Date of Birth: 1932/03/05  OT Diagnosis: hemiplegia affecting dominant side and muscle weakness (generalized) Rehab Potential: Rehab Potential: Good ELOS: 5-7 days   Today's Date: 01/11/2013 Time: 0900-1000 Time Calculation (min): 60 min  Problem List:  Patient Active Problem List   Diagnosis Date Noted  . CVA (cerebral infarction) 01/10/2013  . Stroke 01/07/2013  . Well adult exam 06/01/2012  . Headache 03/02/2012  . Grief reaction 09/19/2011  . Memory deficit 05/26/2011  . Chest pain 04/24/2011  . HYPONATREMIA 07/16/2010  . DEPRESSION/ANXIETY 04/03/2010  . FATIGUE 03/06/2010  . WEIGHT LOSS 03/06/2010  . VERTIGO 10/23/2009  . LIVER FUNCTION TESTS, ABNORMAL, HX OF 08/17/2009  . CHEST WALL PAIN 04/06/2008  . GERD 10/15/2007  . Other specified acquired hypothyroidism 10/06/2007  . HYPOTHYROIDISM 07/19/2007  . HYPERTENSION 07/19/2007  . ALLERGIC RHINITIS 07/19/2007  . DIVERTICULOSIS, COLON 07/19/2007  . OSTEOARTHRITIS 07/19/2007  . BREAST CANCER, HX OF 07/19/2007  . VITAMIN D DEFICIENCY 06/09/2007  . Polymyalgia rheumatica 06/09/2007    Past Medical History:  Past Medical History  Diagnosis Date  . Hypothyroid   . Asthma   . Hypertension   . GERD (gastroesophageal reflux disease)   . Depression   . Unspecified vitamin D deficiency   . Allergic rhinitis   . Diverticulitis of colon   . Hepatitis, autoimmune   . Herpes zoster 2011  . Weight loss 6/11  . Hyperlipidemia     statins increased LFTs  . PONV (postoperative nausea and vomiting)     "years ago" (01/07/2013)  . Giant cell arteritis 2003    Michele Diaz  . Temporal arteritis   . Daily headache     "related to temporal arteritis" (01/07/2013)  . Arthritis     "hips, hands, feet; pretty much feels like all over" (01/07/2013)  . Fibromyalgia   . Chronic lower back pain   . Anxiety   . Breast cancer ~ 2008     "right" (01/07/2013)   Past Surgical History:  Past Surgical History  Procedure Laterality Date  . Appendectomy    . Cholecystectomy    . Tonsillectomy    . Abdominal hysterectomy  1988  . Dilation and curettage of uterus    . Breast biopsy Right   . Breast lumpectomy Right     Assessment & Plan Clinical Impression: Michele Diaz is a 79 y.o. female with history of rheumatoid arthritis, hypertension, breast cancer with lumpectomy and radiation therapy. Admitted 01/07/2013 with right-sided weakness x2-3 days as well as bouts of vertigo. MRI of the brain showed small acute infarct left parietal white matter. MRA of the head mild stenosis of the parietal branch of the left middle cerebral artery. Echocardiogram with ejection fraction of 60% without emboli. Carotid Dopplers with no ICA stenosis. Patient did not receive TPA. Neurology services consulted placed on Plavix therapy for stroke prophylaxis as well as subcutaneous Lovenox for DVT prophylaxis. Antivert was added for bouts of dizziness. Urine culture greater than 100,000 Escherichia coli placed on Cipro 01/08/2013.  Patient transferred to CIR on 01/10/2013 .    Patient currently requires supervision to Min assist with basic self-care skills and IADL secondary to muscle weakness, decreased standing balance and hemiplegia.  Prior to hospitalization, patient could complete BADL & IADL independently and was driving  Patient will benefit from skilled intervention to increase independence with basic self-care skills and increase level of independence with iADL prior to  discharge home independently with intermittent supervision from sister.  Anticipate patient will require intermittent supervision and follow up home health.  OT - End of Session Endurance Deficit: No OT Assessment Barriers to Discharge: Decreased caregiver support (intermittent from sister) OT Plan OT Intensity: Minimum of 1-2 x/day, 45 to 90 minutes OT Frequency: 5 out of 7  days OT Duration/Estimated Length of Stay: 5-7 days OT Treatment/Interventions: Balance/vestibular training;Community reintegration;Discharge planning;Cognitive remediation/compensation;Patient/family education;Psychosocial support;Self Care/advanced ADL retraining;Therapeutic Activities;Therapeutic Exercise;UE/LE Strength taining/ROM;Functional mobility training;Neuromuscular re-education OT Recommendation Patient destination: Home Follow Up Recommendations: Outpatient OT Equipment Details: Shower seat TBD  Skilled Therapeutic Intervention OT Evaluation, self care retraining to include shower, dress and groom.  Focused session on forced use of RUE, activity tolerance, dynamic balance with functional mobility.  Reminded patient that she needs to notify the nursing staff when she needs to get up and she stated that she would.  OT Evaluation Precautions/Restrictions  Precautions Precautions: Fall Precaution Comments: dizziness with positional changes continues-- no overt signs of BPPV will continue assessment as time allows Restrictions Weight Bearing Restrictions: No Pain Pain Assessment Pain Assessment: No/denies pain Home Living/Prior Functioning Home Living Lives With: Alone Available Help at Discharge: Family;Available PRN/intermittently Type of Home: House Home Access: Stairs to enter [employer] of Steps: 2 Entrance Stairs-Rails: None Home Layout: One level Bathroom Shower/Tub: Walk-in shower;Door [employer] Toilet: Standard Home Adaptive Equipment: Grab bars in shower;Hand-held shower hose Additional Comments: pt's husband passed a year and a half ago, sister lives nearby and checks on her (at least by phone) daily. Niece coming to stay with her mid-May for a week. Lives in a townhome. Prior Function Level of Independence: Independent with homemaking with ambulation;Independent with gait;Independent with transfers Able to Take Stairs?: Yes Driving: Yes Vocation:  Retired Comments: Likes to walk and take care of potted plants on patio Vision/Perception  Vision - History Baseline Vision: No visual deficits Patient Visual Report: No change from baseline Perception Perception: Within Functional Limits Praxis Praxis: Intact  Cognition Overall Cognitive Status: Within Functional Limits for tasks assessed Arousal/Alertness: Awake/alert Orientation Level: Oriented X4 Sensation Sensation Light Touch: Appears Intact (BUEs) Proprioception: Impaired Detail (RUE) Proprioception Impaired Details: Impaired RUE (WFL except in fingers & thumb) Motor  Motor Motor: Hemiplegia Motor - Skilled Clinical Observations: R hemiplegia Mobility  Supervision Transfers, Supervision-Min assist for dynamic balance Trunk/Postural Assessment  Cervical Assessment Cervical Assessment: Within Functional Limits Thoracic Assessment Thoracic Assessment: Within Functional Limits Lumbar Assessment Lumbar Assessment: Within Functional Limits Postural Control Postural Control: Within Functional Limits  Balance Static Sitting Balance Static Sitting - Level of Assistance: 7: Independent Dynamic Sitting Balance Dynamic Sitting - Level of Assistance: 7: Independent Static Standing Balance Static Standing - Balance Support: No upper extremity supported Static Standing - Level of Assistance: 5: Stand by assistance Dynamic Standing Balance Dynamic Standing - Balance Support: No upper extremity supported Dynamic Standing - Level of Assistance: 4: Min assist Extremity/Trunk Assessment RUE Assessment RUE Assessment: Exceptions to [facility] RUE AROM (degrees) RUE Overall AROM Comments: grossly WFL except shoulder internal rotation and arthritic fingers and thumb.  Patient reports premorbidly, middle finger "gets stuck when I bend it" RUE Strength RUE Overall Strength Comments: Unable to full assess due to arthritic pain, overall 3+/4 and weak grip (especially thumb and forefinger) and  using built up foam handle for eating. LUE Assessment LUE Assessment: Exceptions to [facility] LUE Strength LUE Overall Strength Comments: Overall 4-/5 overall   FIM:  FIM - Grooming Grooming Steps: Wash, rinse, dry face;Wash, rinse,  dry hands;Oral care, brush teeth, clean dentures;Brush, comb hair Grooming: 5: Set-up assist to obtain items FIM - Bathing Bathing Steps Patient Completed: Chest;Right Arm;Left Arm;Abdomen;Front perineal area;Buttocks;Right upper leg;Left upper leg;Right lower leg (including foot);Left lower leg (including foot) Bathing: 5: Set-up assist to: Adjust water temp FIM - Upper Body Dressing/Undressing Upper body dressing/undressing steps patient completed: Thread/unthread right bra strap;Thread/unthread left bra strap;Hook/unhook bra;Thread/unthread right sleeve of pullover shirt/dresss;Thread/unthread left sleeve of pullover shirt/dress;Put head through opening of pull over shirt/dress;Pull shirt over trunk Upper body dressing/undressing: 5: Set-up assist to: Obtain clothing/put away FIM - Lower Body Dressing/Undressing Lower body dressing/undressing steps patient completed: Thread/unthread right underwear leg;Thread/unthread left underwear leg;Pull underwear up/down;Thread/unthread right pants leg;Thread/unthread left pants leg;Pull pants up/down;Don/Doff right sock;Don/Doff left sock;Don/Doff right shoe;Don/Doff left shoe;Fasten/unfasten right shoe;Fasten/unfasten left shoe Lower body dressing/undressing: 5: Set-up assist to: Obtain clothing FIM - Toileting Toileting steps completed by patient: Adjust clothing prior to toileting;Performs perineal hygiene;Adjust clothing after toileting Toileting: 5: Supervision: Safety issues/verbal cues FIM - Bed/Chair Transfer Bed/Chair Transfer: 4: Chair or W/C > Bed: Min A (steadying Pt. > 75%);4: Bed > Chair or W/C: Min A (steadying Pt. > 75%) FIM - Diplomatic Services operational officer Devices: Grab bars Toilet Transfers:  4-To toilet/BSC: Min A (steadying Pt. > 75%);4-From toilet/BSC: Min A (steadying Pt. > 75%) FIM - Secretary/administrator Devices: Grab bars;Shower chair;Walk in Community education officer Transfers: 4-Into Tub/Shower: Min A (steadying Pt. > 75%/lift 1 leg);4-Out of Tub/Shower: Min A (steadying Pt. > 75%/lift 1 leg)   Refer to Care Plan for Long Term Goals  Recommendations for other services: None  Discharge Criteria: Patient will be discharged from OT if patient refuses treatment 3 consecutive times without medical reason, if treatment goals not met, if there is a change in medical status, if patient makes no progress towards goals or if patient is discharged from hospital.  The above assessment, treatment plan, treatment alternatives and goals were discussed and mutually agreed upon: by patient  Ugonna Keirsey 01/11/2013, 1:20 PM

## 2013-01-11 NOTE — Progress Notes (Signed)
Social Work Assessment and Plan Social Work Assessment and Plan  Patient Details  Name: Michele Diaz MRN: [id number] Date of Birth: 04-14-1932  Today's Date: 01/11/2013  Problem List:  Patient Active Problem List   Diagnosis Date Noted  . CVA (cerebral infarction) 01/10/2013  . Stroke 01/07/2013  . Well adult exam 06/01/2012  . Headache 03/02/2012  . Grief reaction 09/19/2011  . Memory deficit 05/26/2011  . Chest pain 04/24/2011  . HYPONATREMIA 07/16/2010  . DEPRESSION/ANXIETY 04/03/2010  . FATIGUE 03/06/2010  . WEIGHT LOSS 03/06/2010  . VERTIGO 10/23/2009  . LIVER FUNCTION TESTS, ABNORMAL, HX OF 08/17/2009  . CHEST WALL PAIN 04/06/2008  . GERD 10/15/2007  . Other specified acquired hypothyroidism 10/06/2007  . HYPOTHYROIDISM 07/19/2007  . HYPERTENSION 07/19/2007  . ALLERGIC RHINITIS 07/19/2007  . DIVERTICULOSIS, COLON 07/19/2007  . OSTEOARTHRITIS 07/19/2007  . BREAST CANCER, HX OF 07/19/2007  . VITAMIN D DEFICIENCY 06/09/2007  . Polymyalgia rheumatica 06/09/2007   Past Medical History:  Past Medical History  Diagnosis Date  . Hypothyroid   . Asthma   . Hypertension   . GERD (gastroesophageal reflux disease)   . Depression   . Unspecified vitamin D deficiency   . Allergic rhinitis   . Diverticulitis of colon   . Hepatitis, autoimmune   . Herpes zoster 2011  . Weight loss 6/11  . Hyperlipidemia     statins increased LFTs  . PONV (postoperative nausea and vomiting)     "years ago" (01/07/2013)  . Giant cell arteritis 2003    devashwar  . Temporal arteritis   . Daily headache     "related to temporal arteritis" (01/07/2013)  . Arthritis     "hips, hands, feet; pretty much feels like all over" (01/07/2013)  . Fibromyalgia   . Chronic lower back pain   . Anxiety   . Breast cancer ~ 2008    "right" (01/07/2013)   Past Surgical History:  Past Surgical History  Procedure Laterality Date  . Appendectomy    . Cholecystectomy    . Tonsillectomy    . Abdominal  hysterectomy  1988  . Dilation and curettage of uterus    . Breast biopsy Right   . Breast lumpectomy Right    Social History:  reports that she has never smoked. She has never used smokeless tobacco. She reports that she does not drink alcohol or use illicit drugs.  Family / Support Systems Marital Status: Widow/Widower Patient Roles: Parent;Other (Comment) (Sibling) Children: Chyanna Flock  [phone] Other Supports: Catha Gosselin  [phone] Anticipated Caregiver: Self Ability/Limitations of Caregiver: Sister can check on daily and come by since lives close.  Both son's love out of town Caregiver Availability: Intermittent Family Dynamics: Son's are involved and visit, but are both out of town.  Granddaughter works here in the hospital.  Pt's sister lives in the same complex and visits often.  Social History Preferred language: English Religion: Quaker Cultural Background: No issues Education: [employer] Read: Yes Write: Yes Employment Status: Retired Fish farm manager Issues: No issues Guardian/Conservator: None-according to MD pt is capable of making her own decisions   Abuse/Neglect Physical Abuse: Denies Verbal Abuse: Denies Sexual Abuse: Denies Exploitation of patient/patient's resources: Denies Self-Neglect: Denies  Emotional Status Pt's affect, behavior adn adjustment status: Pt is motivated to improve and return to her independent level.  She reports she has arthritis and it is difficult to tell which is bothering her which day.  She has imporved which is encouraging to her. Recent  Psychosocial Issues: Other medical issues Pyschiatric History: No history-deferred depression screen due to pt feels she is doing welll and this is not necessary.  She has a strong faith and takes each day at a time.  This has always served her well. Substance Abuse History: No issues  Patient / Family Perceptions, Expectations & Goals Pt/Family understanding of  illness & functional limitations: Pt is able to explain her stroke and vertigo.  She reports she has been dealing with vertigo for months now.  She takes her time and depdnes upon the day what she can do.  She is putting forth her best effort. Premorbid pt/family roles/activities: Mother, Grandmother, retiree, [employer] member, etc Anticipated changes in roles/activities/participation: resume Pt/family expectations/goals: Pt states: " I want to be able to return home by myself, I plan to do this, but want to be stronger and less dizzy."    [employer]: None Premorbid Home Care/DME Agencies: None Transportation available at discharge: [employer] referrals recommended: Support group (specify) ([facility] Support group)  Discharge Planning Living Arrangements: Alone Support Systems: Children;Other relatives;Friends/neighbors;Church/faith community Type of Residence: Private residence Insurance Resources: Administrator (specify) Herbalist) Financial Resources: Social Security Financial Screen Referred: No Living Expenses: Own Money Management: Patient Do you have any problems obtaining your medications?: No Home Management: Self Patient/Family Preliminary Plans: Return home to her townhome, with family checking in on her daily.  She needs to be mod/i level to return home safely.  Will see if family can come by daily and provide transportation. Social Work Anticipated Follow Up Needs: HH/OP;Support Group  Clinical Impression Pleasant hard working female who is doing her best with her deficits.  She seems to be doing well and will reach mod/i level goals. Will check to see if family can check on daily once home.  Short length of stay.  Lucy Chris 01/11/2013, 1:18 PM

## 2013-01-11 NOTE — Progress Notes (Signed)
Patient information reviewed and entered into eRehab system by Rhonin Trott, RN, CRRN, PPS Coordinator.  Information including medical coding and functional independence measure will be reviewed and updated through discharge.     Per nursing patient was given "Data Collection Information Summary for Patients in Inpatient Rehabilitation Facilities with attached "Privacy Act Statement-Health Care Records" upon admission.  

## 2013-01-11 NOTE — Progress Notes (Signed)
Patient ID: Michele Diaz, female   DOB: Nov 28, 1931, 79 y.o.   MRN: [id number] Subjective/Complaints: 79 y.o. female with history of rheumatoid arthritis, hypertension, breast cancer with lumpectomy and radiation therapy. Admitted 01/07/2013 with right-sided weakness x2-3 days as well as bouts of vertigo. MRI of the brain showed small acute infarct left parietal white matter. MRA of the head mild stenosis of the parietal branch of the left middle cerebral artery. Echocardiogram with ejection fraction of 60% without emboli. Carotid Dopplers with no ICA stenosis. Patient did not receive TPA. Neurology services consulted placed on Plavix therapy for stroke prophylaxis as well as subcutaneous Lovenox for DVT prophylaxis. Antivert was added for bouts of dizziness. Urine culture greater than 100,000 Escherichia coli  Slept well Discussed CVA etiology Review of Systems  Musculoskeletal: Positive for joint pain.       R hand and wrist  Neurological: Positive for focal weakness. Negative for sensory change.  All other systems reviewed and are negative.    Objective: Vital Signs: Blood pressure 165/81, pulse 53, temperature 98 F (36.7 C), temperature source Oral, resp. rate 20, height 5\' 2"  (1.575 m), weight 50.758 kg (111 lb 14.4 oz), SpO2 99.00%. No results found. Results for orders placed during the hospital encounter of 01/10/13 (from the past 72 hour(s))  CBC     Status: Abnormal   Collection Time    01/10/13  3:58 PM      Result Value Range   WBC 7.8  4.0 - 10.5 K/uL   RBC 4.11  3.87 - 5.11 MIL/uL   Hemoglobin 12.4  12.0 - 15.0 g/dL   HCT 40.9 (*) 81.1 - 91.4 %   MCV 85.9  78.0 - 100.0 fL   MCH 30.2  26.0 - 34.0 pg   MCHC 35.1  30.0 - 36.0 g/dL   RDW 78.2  95.6 - 21.3 %   Platelets 130 (*) 150 - 400 K/uL  CREATININE, SERUM     Status: Abnormal   Collection Time    01/10/13  3:58 PM      Result Value Range   Creatinine, Ser 0.92  0.50 - 1.10 mg/dL   GFR calc non Af Amer 58 (*) >90  mL/min   GFR calc Af Amer 67 (*) >90 mL/min   Comment:            The eGFR has been calculated     using the CKD EPI equation.     This calculation has not been     validated in all clinical     situations.     eGFR's persistently     <90 mL/min signify     possible Chronic Kidney Disease.  CBC WITH DIFFERENTIAL     Status: Abnormal   Collection Time    01/11/13  6:08 AM      Result Value Range   WBC 5.8  4.0 - 10.5 K/uL   RBC 4.12  3.87 - 5.11 MIL/uL   Hemoglobin 12.3  12.0 - 15.0 g/dL   HCT 08.6 (*) 57.8 - 46.9 %   MCV 86.2  78.0 - 100.0 fL   MCH 29.9  26.0 - 34.0 pg   MCHC 34.6  30.0 - 36.0 g/dL   RDW 62.9  52.8 - 41.3 %   Platelets 124 (*) 150 - 400 K/uL   Neutrophils Relative 67  43 - 77 %   Neutro Abs 3.9  1.7 - 7.7 K/uL   Lymphocytes Relative 20  12 - 46 %  Lymphs Abs 1.2  0.7 - 4.0 K/uL   Monocytes Relative 9  3 - 12 %   Monocytes Absolute 0.5  0.1 - 1.0 K/uL   Eosinophils Relative 3  0 - 5 %   Eosinophils Absolute 0.2  0.0 - 0.7 K/uL   Basophils Relative 0  0 - 1 %   Basophils Absolute 0.0  0.0 - 0.1 K/uL  COMPREHENSIVE METABOLIC PANEL     Status: Abnormal   Collection Time    01/11/13  6:08 AM      Result Value Range   Sodium 137  135 - 145 mEq/L   Potassium 3.7  3.5 - 5.1 mEq/L   Chloride 101  96 - 112 mEq/L   CO2 26  19 - 32 mEq/L   Glucose, Bld 79  70 - 99 mg/dL   BUN 13  6 - 23 mg/dL   Creatinine, Ser 1.61  0.50 - 1.10 mg/dL   Calcium 9.6  8.4 - 09.6 mg/dL   Total Protein 8.1  6.0 - 8.3 g/dL   Albumin 3.3 (*) 3.5 - 5.2 g/dL   AST 21  0 - 37 U/L   ALT 8  0 - 35 U/L   Alkaline Phosphatase 58  39 - 117 U/L   Total Bilirubin 0.8  0.3 - 1.2 mg/dL   GFR calc non Af Amer 83 (*) >90 mL/min   GFR calc Af Amer >90  >90 mL/min   Comment:            The eGFR has been calculated     using the CKD EPI equation.     This calculation has not been     validated in all clinical     situations.     eGFR's persistently     <90 mL/min signify     possible  Chronic Kidney Disease.     HEENT: normal Cardio: RRR and no murmur Resp: CTA B/L and unlabored GI: BS positive and no tender Extremity:  Pulses positive and No Edema Skin:   Intact Neuro: Alert/Oriented, Cranial Nerve II-XII normal, Normal Sensory, Abnormal Motor 3+/5 R delt bi tri grip, 4-/5 R HF,KE, ADF/PDF and Abnormal FMC Ataxic/ dec FMC Musc/Skel:  Other mult PIP,DIP,MCP arthritic changes Gen NAD   Assessment/Plan: 1. Functional deficits secondary to L parietal infarct with R HP which require 3+ hours per day of interdisciplinary therapy in a comprehensive inpatient rehab setting. Physiatrist is providing close team supervision and 24 hour management of active medical problems listed below. Physiatrist and rehab team continue to assess barriers to discharge/monitor patient progress toward functional and medical goals. FIM:                   Comprehension Comprehension Mode: Auditory Comprehension: 6-Follows complex conversation/direction: With extra time/assistive device  Expression Expression Mode: Verbal Expression: 5-Expresses complex 90% of the time/cues < 10% of the time  Social Interaction Social Interaction: 6-Interacts appropriately with others with medication or extra time (anti-anxiety, antidepressant).  Problem Solving Problem Solving: 6-Solves complex problems: With extra time  Memory Memory: 6-More than reasonable amt of time Medical Problem List and Plan:  1. Left parietal, thrombotic infarct  2. DVT Prophylaxis/Anticoagulation: Subcutaneous Lovenox. Monitor platelet counts any signs of bleeding  3. Pain Management: Tylenol  4. Mood: Remeron 7.5 mg each bedtime, Xanax 0.25-0.5 mg 3 times a day as needed. Provide emotional support and positive reinforcement  5. Neuropsych: This patient is capable of making decisions on his/her own  behalf.  6. Rheumatoid arthritis. Prednisone 5 mg daily, Imuran 50 mg daily. Monitor for any signs of flareup  7.  E. coli urine tract infection. Cipro initiated 01/08/2013.  8. Hypothyroidism. Synthroid  9. GERD. Protonix  10. Vertigo. Antivert 12.5 mg twice a day and will titrate as needed. May consider vestibular evaluation    LOS (Days) 1 A FACE TO FACE EVALUATION WAS PERFORMED  KIRSTEINS,ANDREW E 01/11/2013, 8:29 AM

## 2013-01-11 NOTE — Care Management Note (Signed)
Inpatient Rehabilitation Center Individual Statement of Services  Patient Name:  Michele Diaz  Date:  01/11/2013  Welcome to the Inpatient Rehabilitation Center.  Our goal is to provide you with an individualized program based on your diagnosis and situation, designed to meet your specific needs.  With this comprehensive rehabilitation program, you will be expected to participate in at least 3 hours of rehabilitation therapies Monday-Friday, with modified therapy programming on the weekends.  Your rehabilitation program will include the following services:  Physical Therapy (PT), Occupational Therapy (OT), Speech Therapy (ST), 24 hour per day rehabilitation nursing, Case Management ( Social Worker), Rehabilitation Medicine, Nutrition Services and Pharmacy Services  Weekly team conferences will be held on Wednesday to discuss your progress.  Your Social Worker will talk with you frequently to get your input and to update you on team discussions.  Team conferences with you and your family in attendance may also be held.  Expected length of stay: 5 days Overall anticipated outcome: mod/i level  Depending on your progress and recovery, your program may change. Your Social Worker will coordinate services and will keep you informed of any changes. Your Child psychotherapist names and contact numbers are listed  below.  The following services may also be recommended but are not provided by the Inpatient Rehabilitation Center:   Driving Evaluations  Home Health Rehabiltiation Services  Outpatient Rehabilitatation Servives   Arrangements will be made to provide these services after discharge if needed.  Arrangements include referral to agencies that provide these services.  Your insurance has been verified to be:  Medicare & BCBS Your primary doctor is:  Dr Jacinta Shoe  Pertinent information will be shared with your doctor and your insurance company.  Social Worker:  Dossie Der, [place]  [phone]  Information discussed with and copy given to patient by: Lucy Chris, 01/11/2013, 11:11 AM

## 2013-01-11 NOTE — Progress Notes (Signed)
Physical Therapy Session Note  Patient Details  Name: Michele Diaz MRN: [id number] Date of Birth: 06-28-1932  Today's Date: 01/11/2013 Time: 1430-1530 Time Calculation (min): 60 min  Short Term Goals: Week 1:  PT Short Term Goal 1 (Week 1): = LTG secondary to short LOS   Therapy Documentation Precautions:  Precautions Precautions: Fall Precaution Comments: dizziness with positional changes continues Restrictions Weight Bearing Restrictions: No Pain: Pain Assessment Pain Assessment: No/denies pain Locomotion : Patient performed gait within room from recliner > toilet > sink with supervision but maintaining contact on wall or furniture and reaching into drawers for items with supervision and no c/o dizziness.  Patient performed toileting with supervision.  Performed ambulation x 150' x 2 reps with supervision overall and improved gait velocity.     Performed 6 minute walk test with supervision overall; distance: 268.736 meters in 6 minutes with a pulse of 64 bpm; normative value for female 79 yo is 471 meters.  Patient reports feeling more stable when ambulating with faster velocity.    Balance: Standardized Balance Assessment Standardized Balance Assessment: Berg Balance Test Berg Balance Test Sit to Stand: Able to stand  independently using hands Standing Unsupported: Able to stand safely 2 minutes Sitting with Back Unsupported but Feet Supported on Floor or Stool: Able to sit safely and securely 2 minutes Stand to Sit: Controls descent by using hands Transfers: Able to transfer safely, minor use of hands Standing Unsupported with Eyes Closed: Able to stand 10 seconds safely Standing Ubsupported with Feet Together: Able to place feet together independently and stand 1 minute safely From Standing, Reach Forward with Outstretched Arm: Can reach forward >12 cm safely (5") From Standing Position, Pick up Object from Floor: Able to pick up shoe, needs supervision From Standing  Position, Turn to Look Behind Over each Shoulder: Looks behind one side only/other side shows less weight shift Turn 360 Degrees: Able to turn 360 degrees safely but slowly Standing Unsupported, Alternately Place Feet on Step/Stool: Able to stand independently and complete 8 steps >20 seconds Standing Unsupported, One Foot in Front: Able to take small step independently and hold 30 seconds Standing on One Leg: Able to lift leg independently and hold 5-10 seconds Total Score: 45 Patient demonstrates increased fall risk as noted by score of 45/56 on Berg Balance Scale.  (<36= high risk for falls, close to 100%; 37-45 significant >80%; 46-51 moderate >50%; 52-55 lower >25%); discussed falls risk with patient and reinforced need to call for assistance for mobility within the room.  Patient verbalized understanding.     See FIM for current functional status  Therapy/Group: Individual Therapy  Edman Circle [facility] 01/11/2013, 4:51 PM

## 2013-01-11 NOTE — Evaluation (Signed)
Physical Therapy Assessment and Plan  Patient Details  Name: Michele Diaz MRN: [id number] Date of Birth: 15-Oct-1931  PT Diagnosis: Abnormality of gait, Coordination disorder, Difficulty walking, Dizziness and giddiness, Hemiplegia dominant, Impaired sensation, Muscle weakness, Osteoarthritis and Vertigo Rehab Potential: Excellent ELOS: 5-7 days   Today's Date: 01/11/2013 Time: 1002-1105 Time Calculation (min): 63 min  Problem List:  Patient Active Problem List   Diagnosis Date Noted  . CVA (cerebral infarction) 01/10/2013  . Stroke 01/07/2013  . Well adult exam 06/01/2012  . Headache 03/02/2012  . Grief reaction 09/19/2011  . Memory deficit 05/26/2011  . Chest pain 04/24/2011  . HYPONATREMIA 07/16/2010  . DEPRESSION/ANXIETY 04/03/2010  . FATIGUE 03/06/2010  . WEIGHT LOSS 03/06/2010  . VERTIGO 10/23/2009  . LIVER FUNCTION TESTS, ABNORMAL, HX OF 08/17/2009  . CHEST WALL PAIN 04/06/2008  . GERD 10/15/2007  . Other specified acquired hypothyroidism 10/06/2007  . HYPOTHYROIDISM 07/19/2007  . HYPERTENSION 07/19/2007  . ALLERGIC RHINITIS 07/19/2007  . DIVERTICULOSIS, COLON 07/19/2007  . OSTEOARTHRITIS 07/19/2007  . BREAST CANCER, HX OF 07/19/2007  . VITAMIN D DEFICIENCY 06/09/2007  . Polymyalgia rheumatica 06/09/2007    Past Medical History:  Past Medical History  Diagnosis Date  . Hypothyroid   . Asthma   . Hypertension   . GERD (gastroesophageal reflux disease)   . Depression   . Unspecified vitamin D deficiency   . Allergic rhinitis   . Diverticulitis of colon   . Hepatitis, autoimmune   . Herpes zoster 2011  . Weight loss 6/11  . Hyperlipidemia     statins increased LFTs  . PONV (postoperative nausea and vomiting)     "years ago" (01/07/2013)  . Giant cell arteritis 2003    devashwar  . Temporal arteritis   . Daily headache     "related to temporal arteritis" (01/07/2013)  . Arthritis     "hips, hands, feet; pretty much feels like all over" (01/07/2013)  .  Fibromyalgia   . Chronic lower back pain   . Anxiety   . Breast cancer ~ 2008    "right" (01/07/2013)   Past Surgical History:  Past Surgical History  Procedure Laterality Date  . Appendectomy    . Cholecystectomy    . Tonsillectomy    . Abdominal hysterectomy  1988  . Dilation and curettage of uterus    . Breast biopsy Right   . Breast lumpectomy Right     Assessment & Plan Clinical Impression: Patient is a 79 y.o. female with history of rheumatoid arthritis, hypertension, breast cancer with lumpectomy and radiation therapy. Admitted 01/07/2013 with right-sided weakness x 2-3 days as well as bouts of vertigo. MRI of the brain showed small acute infarct left parietal white matter. MRA of the head mild stenosis of the parietal branch of the left middle cerebral artery. Echocardiogram with ejection fraction of 60% without emboli. Carotid Dopplers with no ICA stenosis. Patient did not receive TPA. Neurology services consulted placed on Plavix therapy for stroke prophylaxis as well as subcutaneous Lovenox for DVT prophylaxis. Antivert was added for bouts of dizziness. Urine culture greater than 100,000 Escherichia coli placed on Cipro 01/08/2013.  Patient transferred to CIR on 01/10/2013 .   Patient currently requires min with mobility secondary to muscle weakness, decreased coordination, decreased memory, h/o vertigo and dizziness and decreased standing balance and hemiplegia.  Prior to hospitalization, patient was independent  with mobility and lived with Alone in a House home.  Home access is 2Stairs to enter.  Patient will  benefit from skilled PT intervention to maximize safe functional mobility and minimize fall risk for planned discharge home with intermittent assist.  Anticipate patient will benefit from follow up OP at discharge.  PT - End of Session Activity Tolerance: Endurance does not limit participation in activity Endurance Deficit: No PT Assessment Rehab Potential:  Excellent Barriers to Discharge: None PT Plan PT Intensity: Minimum of 1-2 x/day ,45 to 90 minutes PT Frequency: 5 out of 7 days PT Duration Estimated Length of Stay: 5-7 days PT Treatment/Interventions: Ambulation/gait training;Balance/vestibular training;Community reintegration;Discharge planning;Functional mobility training;Neuromuscular re-education;Patient/family education;Stair training;Therapeutic Activities;Therapeutic Exercise;UE/LE Strength taining/ROM;UE/LE Coordination activities PT Recommendation Follow Up Recommendations: Outpatient PT Patient destination: Home Equipment Recommended: None recommended by PT  Skilled Therapeutic Intervention Focus on patient's specific goals with discussion of LOS.  Continued to perform gait training with vertical and horizontal head turns while maintaining constant velocity with supervision-min A overall.   PT Evaluation Precautions/Restrictions Precautions Precautions: Fall Precaution Comments: dizziness with positional changes continues Restrictions Weight Bearing Restrictions: No General Chart Reviewed: Yes Additional Pertinent History: h/o vertigo and dizziness Family/Caregiver Present: No  Vital SignsTherapy Vitals Pulse Rate: 75 BP: 131/79 mmHg Patient Position, if appropriate: Sitting Oxygen Therapy SpO2: 99 % O2 Device: None (Room air) Pain Pain Assessment Pain Assessment: No/denies pain Home Living/Prior Functioning Home Living Lives With: Alone Available Help at Discharge: Family;Available PRN/intermittently Type of Home: House Home Access: Stairs to enter [employer] of Steps: 2 Entrance Stairs-Rails: None Home Layout: One level Additional Comments: pt's husband passed a year and a half ago, sister lives nearby and checks on her (at least by phone) daily. Niece coming to stay with her mid-May for a week. Lives in a townhome. Prior Function Level of Independence: Independent with homemaking with  ambulation;Independent with gait;Independent with transfers Able to Take Stairs?: Yes Driving: Yes Vocation: Retired Comments: Likes to walk and take care of potted plants on patio Vision/Perception  Vision - History Baseline Vision: No visual deficits Patient Visual Report: No change from baseline Perception Perception: Within Functional Limits Praxis Praxis: Intact  Cognition Overall Cognitive Status: Within Functional Limits for tasks assessed but does present with short term memory deficits. Arousal/Alertness: Awake/alert Orientation Level: Oriented X4 Sensation Sensation Light Touch: Impaired Detail Light Touch Impaired Details: Impaired RLE Proprioception: Impaired by gross assessment Proprioception Impaired Details: Impaired RUE (WFL except in fingers & thumb) Additional Comments: Reports decreased sensation to light touch RLE Coordination Gross Motor Movements are Fluid and Coordinated: No Motor  Motor Motor: Hemiplegia Motor - Skilled Clinical Observations: R hemiplegia  Mobility Bed Mobility Bed Mobility: Not assessed Transfers Stand Pivot Transfers: 4: Min guard;With armrests Locomotion  Ambulation Ambulation: Yes Ambulation/Gait Assistance: 4: Min assist Ambulation Distance (Feet): 150 Feet Assistive device: None Ambulation/Gait Assistance Details: Min A in controlled environment with intermittent RLE crossing midline and intermittent imbalance when changing directions Gait Gait: Yes Gait Pattern: Step-through pattern;Scissoring;Decreased step length - left High Level Ambulation High Level Ambulation: Backwards walking;Direction changes;Sudden stops;Head turns Backwards Walking: with min A Direction Changes: min A secondary to slight RLE scissoring and imbalance Sudden Stops: min A Head Turns: vertical and horizontal; patient unable to maintain gait velocity with head turns, changed to step to sequence; increased imbalance during head turns. Stairs /  Additional Locomotion Stairs: Yes Stairs Assistance: 4: Min assist Stairs Assistance Details (indicate cue type and reason): Initially performed stairs with bilat UE support with alternating sequence; patient reports not having rails at home; transitioned to stair negotiation without use of rails  with min A to perform step to sequence; patient reports that she was able to negotiate steps to enter town home without UE support and while carrying groceries in hand PTA Stair Management Technique: No rails;Two rails;Alternating pattern;Step to pattern;Forwards Number of Stairs: 5 Wheelchair Mobility Wheelchair Mobility: No  Trunk/Postural Assessment  Cervical Assessment Cervical Assessment: Within Functional Limits Thoracic Assessment Thoracic Assessment: Within Functional Limits Lumbar Assessment Lumbar Assessment: Within Functional Limits Postural Control Postural Control: Within Functional Limits  Balance Static Sitting Balance Static Sitting - Level of Assistance: 7: Independent Dynamic Sitting Balance Dynamic Sitting - Level of Assistance: 7: Independent Static Standing Balance Static Standing - Balance Support: No upper extremity supported Static Standing - Level of Assistance: 5: Stand by assistance Dynamic Standing Balance Dynamic Standing - Balance Support: No upper extremity supported Dynamic Standing - Level of Assistance: 4: Min assist Extremity Assessment      RLE Assessment RLE Assessment: Exceptions to [facility] RLE Strength RLE Overall Strength: Deficits RLE Overall Strength Comments: 3+/5 throughout LLE Assessment LLE Assessment: Exceptions to [facility] LLE Strength LLE Overall Strength: Deficits;Due to premorbid status LLE Overall Strength Comments: OA hip and knee; 4+/5 throughout except hip flexion and knee extension 3+/5  FIM:  FIM - Bed/Chair Transfer Bed/Chair Transfer: 4: Chair or W/C > Bed: Min A (steadying Pt. > 75%);4: Bed > Chair or W/C: Min A (steadying Pt. >  75%) FIM - Locomotion: Wheelchair Locomotion: Wheelchair: 0: Activity did not occur FIM - Locomotion: Ambulation Ambulation/Gait Assistance: 4: Min assist Locomotion: Ambulation: 4: Travels 150 ft or more with minimal assistance (Pt.>75%) FIM - Locomotion: Stairs Locomotion: Building control surveyor: Hand rail - 2 Locomotion: Stairs: 2: Up and Down 4 - 11 stairs with minimal assistance (Pt.>75%)   Refer to Care Plan for Long Term Goals  Recommendations for other services: None  Discharge Criteria: Patient will be discharged from PT if patient refuses treatment 3 consecutive times without medical reason, if treatment goals not met, if there is a change in medical status, if patient makes no progress towards goals or if patient is discharged from hospital.  The above assessment, treatment plan, treatment alternatives and goals were discussed and mutually agreed upon: by patient  Stapleton Desanctis 01/11/2013, 12:51 PM

## 2013-01-12 ENCOUNTER — Inpatient Hospital Stay (HOSPITAL_COMMUNITY): Payer: Medicare Other | Admitting: Physical Therapy

## 2013-01-12 ENCOUNTER — Inpatient Hospital Stay (HOSPITAL_COMMUNITY): Payer: Medicare Other

## 2013-01-12 ENCOUNTER — Encounter (HOSPITAL_COMMUNITY): Payer: Medicare Other

## 2013-01-12 NOTE — Progress Notes (Signed)
Physical Therapy Session Note  Patient Details  Name: Michele Diaz MRN: [id number] Date of Birth: 11-09-1931  Today's Date: 01/12/2013 Time: 7829-5621 Time Calculation (min): 41 min  Short Term Goals: Week 1:  PT Short Term Goal 1 (Week 1): = LTG secondary to short LOS  Skilled Therapeutic Interventions/Progress Updates:   Patient performing ambulation in room and toilet transfers and toileting with supervision.  Continued ambulation in controlled environment x 150' supervision; performed simulated car transfer with patient performing mod I into/out of passenger side placing LE into car first prior to sitting.  Educated patient on transfer sequence floor > furniture and indications for calling EMS.  Pt gave repeat demonstration of floor  > mat transfer mod I.  Continued stair negotiation training up and down 2 stairs without rails and while carrying 10lb "grocery bag" in RUE and then in LUE with supervision overall; discussed with patient carrying one bag at a time with one UE into house for safety to allow other UE to brace on doorway if needed for balance and spreading groceries out between many bags to minimize weight.  Performed higher level gait and balance training on level surface x 40' during tandem gait, heel walking, toe walking, retro walking and gait with vertical and horizontal head turns with supervision-min A overall.  Returned to room with supervision.    Therapy Documentation Precautions:  Precautions Precautions: Fall Precaution Comments: dizziness with positional changes continues Restrictions Weight Bearing Restrictions: No Pain: Pain Assessment Pain Assessment: No/denies pain Locomotion : Ambulation Ambulation/Gait Assistance: 5: Supervision   See FIM for current functional status  Therapy/Group: Individual Therapy  [facility] 01/12/2013, 10:54 AM

## 2013-01-12 NOTE — Progress Notes (Signed)
Patient ID: Michele Diaz, female   DOB: 1932-05-09, 79 y.o.   MRN: [id number] Subjective/Complaints: 79 y.o. female with history of rheumatoid arthritis, hypertension, breast cancer with lumpectomy and radiation therapy. Admitted 01/07/2013 with right-sided weakness x2-3 days as well as bouts of vertigo. MRI of the brain showed small acute infarct left parietal white matter. MRA of the head mild stenosis of the parietal branch of the left middle cerebral artery. Echocardiogram with ejection fraction of 60% without emboli. Carotid Dopplers with no ICA stenosis. Patient did not receive TPA. Neurology services consulted placed on Plavix therapy for stroke prophylaxis as well as subcutaneous Lovenox for DVT prophylaxis. Antivert was added for bouts of dizziness. Urine culture greater than 100,000 Escherichia coli placed on Cipro 01/08/2013 I'd like to go home soon to pay bills Review of Systems  Neurological: Positive for sensory change.       Right hand  All other systems reviewed and are negative.    Objective: Vital Signs: Blood pressure 145/80, pulse 62, temperature 97.7 F (36.5 C), temperature source Oral, resp. rate 18, height 5\' 2"  (1.575 m), weight 50.758 kg (111 lb 14.4 oz), SpO2 98.00%. No results found. Results for orders placed during the hospital encounter of 01/10/13 (from the past 72 hour(s))  CBC     Status: Abnormal   Collection Time    01/10/13  3:58 PM      Result Value Range   WBC 7.8  4.0 - 10.5 K/uL   RBC 4.11  3.87 - 5.11 MIL/uL   Hemoglobin 12.4  12.0 - 15.0 g/dL   HCT 78.2 (*) 95.6 - 21.3 %   MCV 85.9  78.0 - 100.0 fL   MCH 30.2  26.0 - 34.0 pg   MCHC 35.1  30.0 - 36.0 g/dL   RDW 08.6  57.8 - 46.9 %   Platelets 130 (*) 150 - 400 K/uL  CREATININE, SERUM     Status: Abnormal   Collection Time    01/10/13  3:58 PM      Result Value Range   Creatinine, Ser 0.92  0.50 - 1.10 mg/dL   GFR calc non Af Amer 58 (*) >90 mL/min   GFR calc Af Amer 67 (*) >90 mL/min   Comment:            The eGFR has been calculated     using the CKD EPI equation.     This calculation has not been     validated in all clinical     situations.     eGFR's persistently     <90 mL/min signify     possible Chronic Kidney Disease.  CBC WITH DIFFERENTIAL     Status: Abnormal   Collection Time    01/11/13  6:08 AM      Result Value Range   WBC 5.8  4.0 - 10.5 K/uL   RBC 4.12  3.87 - 5.11 MIL/uL   Hemoglobin 12.3  12.0 - 15.0 g/dL   HCT 62.9 (*) 52.8 - 41.3 %   MCV 86.2  78.0 - 100.0 fL   MCH 29.9  26.0 - 34.0 pg   MCHC 34.6  30.0 - 36.0 g/dL   RDW 24.4  01.0 - 27.2 %   Platelets 124 (*) 150 - 400 K/uL   Neutrophils Relative 67  43 - 77 %   Neutro Abs 3.9  1.7 - 7.7 K/uL   Lymphocytes Relative 20  12 - 46 %   Lymphs Abs 1.2  0.7 - 4.0 K/uL   Monocytes Relative 9  3 - 12 %   Monocytes Absolute 0.5  0.1 - 1.0 K/uL   Eosinophils Relative 3  0 - 5 %   Eosinophils Absolute 0.2  0.0 - 0.7 K/uL   Basophils Relative 0  0 - 1 %   Basophils Absolute 0.0  0.0 - 0.1 K/uL  COMPREHENSIVE METABOLIC PANEL     Status: Abnormal   Collection Time    01/11/13  6:08 AM      Result Value Range   Sodium 137  135 - 145 mEq/L   Potassium 3.7  3.5 - 5.1 mEq/L   Chloride 101  96 - 112 mEq/L   CO2 26  19 - 32 mEq/L   Glucose, Bld 79  70 - 99 mg/dL   BUN 13  6 - 23 mg/dL   Creatinine, Ser 1.47  0.50 - 1.10 mg/dL   Calcium 9.6  8.4 - 82.9 mg/dL   Total Protein 8.1  6.0 - 8.3 g/dL   Albumin 3.3 (*) 3.5 - 5.2 g/dL   AST 21  0 - 37 U/L   ALT 8  0 - 35 U/L   Alkaline Phosphatase 58  39 - 117 U/L   Total Bilirubin 0.8  0.3 - 1.2 mg/dL   GFR calc non Af Amer 83 (*) >90 mL/min   GFR calc Af Amer >90  >90 mL/min   Comment:            The eGFR has been calculated     using the CKD EPI equation.     This calculation has not been     validated in all clinical     situations.     eGFR's persistently     <90 mL/min signify     possible Chronic Kidney Disease.     HEENT: normal Cardio:  RRR and no murmurs Resp: CTA B/L and unlabored GI: BS positive and non tender Extremity:  Pulses positive and No Edema Skin:   Intact Neuro: Anxious, Cranial Nerve II-XII normal, Abnormal Sensory reduced dorsum R hnad, Abnormal Motor 4-/5 R delt,bi,tri,grip LUE and BLE 5/5 and Abnormal FMC Ataxic/ dec FMC Musc/Skel:  Normal Gen NAD   Assessment/Plan: 1. Functional deficits secondary to Left parietal infarct which require 3+ hours per day of interdisciplinary therapy in a comprehensive inpatient rehab setting. Physiatrist is providing close team supervision and 24 hour management of active medical problems listed below. Physiatrist and rehab team continue to assess barriers to discharge/monitor patient progress toward functional and medical goals. FIM: FIM - Bathing Bathing Steps Patient Completed: Chest;Right Arm;Left Arm;Abdomen;Front perineal area;Buttocks;Right upper leg;Left upper leg;Right lower leg (including foot);Left lower leg (including foot) Bathing: 5: Supervision: Safety issues/verbal cues  FIM - Upper Body Dressing/Undressing Upper body dressing/undressing steps patient completed: Thread/unthread right sleeve of pullover shirt/dresss;Thread/unthread left sleeve of pullover shirt/dress;Put head through opening of pull over shirt/dress;Pull shirt over trunk Upper body dressing/undressing: 5: Supervision: Safety issues/verbal cues FIM - Lower Body Dressing/Undressing Lower body dressing/undressing steps patient completed: Thread/unthread right underwear leg;Thread/unthread left underwear leg;Pull underwear up/down;Thread/unthread right pants leg;Thread/unthread left pants leg;Pull pants up/down;Don/Doff right sock;Don/Doff left sock;Don/Doff right shoe;Don/Doff left shoe;Fasten/unfasten right shoe;Fasten/unfasten left shoe Lower body dressing/undressing: 5: Supervision: Safety issues/verbal cues  FIM - Toileting Toileting steps completed by patient: Adjust clothing prior to  toileting;Performs perineal hygiene;Adjust clothing after toileting Toileting: 5: Supervision: Safety issues/verbal cues  FIM - Diplomatic Services operational officer Devices: Grab bars Toilet Transfers:  4-To toilet/BSC: Min A (steadying Pt. > 75%);4-From toilet/BSC: Min A (steadying Pt. > 75%)  FIM - Bed/Chair Transfer Bed/Chair Transfer Assistive Devices: HOB elevated Bed/Chair Transfer: 5: Supine > Sit: Supervision (verbal cues/safety issues);5: Bed > Chair or W/C: Supervision (verbal cues/safety issues) (EOB <> stand <> sink)  FIM - Locomotion: Wheelchair Locomotion: Wheelchair: 0: Activity did not occur FIM - Locomotion: Ambulation Ambulation/Gait Assistance: 4: Min assist Locomotion: Ambulation: 4: Travels 150 ft or more with minimal assistance (Pt.>75%)  Comprehension Comprehension Mode: Auditory Comprehension: 6-Follows complex conversation/direction: With extra time/assistive device  Expression Expression Mode: Verbal Expression: 6-Expresses complex ideas: With extra time/assistive device  Social Interaction Social Interaction: 6-Interacts appropriately with others with medication or extra time (anti-anxiety, antidepressant).  Problem Solving Problem Solving: 5-Solves complex 90% of the time/cues < 10% of the time  Memory Memory: 5-Recognizes or recalls 90% of the time/requires cueing < 10% of the time Medical Problem List and Plan:  1. Left parietal, thrombotic infarct  2. DVT Prophylaxis/Anticoagulation: Subcutaneous Lovenox. Monitor platelet counts any signs of bleeding  3. Pain Management: Tylenol  4. Mood: Remeron 7.5 mg each bedtime, Xanax 0.25-0.5 mg 3 times a day as needed. Provide emotional support and positive reinforcement  5. Neuropsych: This patient is capable of making decisions on his/her own behalf.  6. Rheumatoid arthritis. Prednisone 5 mg daily, Imuran 50 mg daily. Monitor for any signs of flareup  7. E. coli urine tract infection. Cipro  initiated 01/08/2013.  8. Hypothyroidism. Synthroid  9. GERD. Protonix  10. Vertigo. Antivert 12.5 mg twice a day and will titrate as needed. May consider vestibular evaluation    LOS (Days) 2 A FACE TO FACE EVALUATION WAS PERFORMED  KIRSTEINS,ANDREW E 01/12/2013, 9:35 AM

## 2013-01-12 NOTE — Progress Notes (Signed)
Social Work Patient ID: Michele Diaz, female   DOB: 1932/07/02, 79 y.o.   MRN: [id number] Met with pt to inform of team conference goals-mod/i level and discharge 5/2.  She is very pleased with her progress and Will be ready to return home.  She will have her sister transport her to OP therapies and has no DME needs.  Will get first appt for OP And let pt know this.

## 2013-01-12 NOTE — Progress Notes (Signed)
Occupational Therapy Note  Patient Details  Name: Michele Diaz MRN: [id number] Date of Birth: 28-Aug-1932 Today's Date: 01/12/2013  Time: 8:30-9:30am ( .) Pt seen for 1:1 OT session with focus on transfers, high level balance during ADL routine, strengthening and FMC of R hand. Pt supine in bed upon arrival, ready to bathe and dress. Pt requested a sink bath, which she completed in standing. No LOB noted during activity. Pt gathered clothing after washing up and dressed UB standing and LB sitting. Pt states she is mainly concerned about being able to drive and the strength in her R hand. Showed pt several theraputty exercises which she was able to demonstrate. Pt also had some concerns about being able to sign her name. Pt has arthritis/osteoarthritis in R hand, although is now dealing with weakness as well. When red foam placed on pen, pt able to sign her name and write legibly. Pt stated, "this is so much better than it was yesterday" as she was writing. Pt stated she had difficulty getting red foam onto utensils, advised her to ask person bringing meal to put it on for her (or hit call bell.)  Ended session with pt sitting in wheelchair with call bell within reach. No pain reported.    [name] M Arthea Nobel 01/12/2013, 12:00 PM

## 2013-01-12 NOTE — Progress Notes (Signed)
Physical Therapy Session Note  Patient Details  Name: Michele Diaz MRN: [id number] Date of Birth: 06-11-1932  Today's Date: 01/12/2013 Time: 1430-1530 Time Calculation (min): 60 min  Skilled Therapeutic Interventions/Progress Updates:     stride right group session focused on gait training with assistive device, Nu Step for leg strengthening and endurance training.  Seated and standing LE there ex, obstacle course navigation, and dynamic gait activities holding onto railing in the hallway.      Therapy Documentation Precautions:  Precautions Precautions: Fall Precaution Comments: dizziness with positional changes continues Restrictions Weight Bearing Restrictions: No    Vital Signs: Therapy Vitals Temp: 97.8 F (36.6 C) Temp src: Oral Pulse Rate: 76 Resp: 18 BP: 125/84 mmHg Patient Position, if appropriate: Sitting Oxygen Therapy SpO2: 98 % O2 Device: None (Room air) Pain: Pain Assessment Pain Assessment: 0-10 Pain Score: 0-No pain Patients Stated Pain Goal: 3 Multiple Pain Sites: No  See FIM for current functional status  Therapy/Group: Group Therapy  Felicity Penix B. Raegyn Renda, PT, DPT [phone]   01/12/2013, 4:33 PM

## 2013-01-12 NOTE — Progress Notes (Signed)
Occupational Therapy Session Note  Patient Details  Name: Michele Diaz MRN: [id number] Date of Birth: 11/10/1931  Today's Date: 01/12/2013 Time: 4098-1191 Time Calculation (min): 60 min  Short Term Goals: Week 1:  OT Short Term Goal 1 (Week 1): STG=LTG due to ESLOS   Skilled Therapeutic Interventions/Progress Updates:    Therapy session focused on higher level IADL tasks, fine motor skills, and dynamic standing balance. Pt ambulated to nursing station without AD at supervision level to retrieve cup, lid, and straws then carried them back to room holding straws in Rt hand and cup in Lt hand. Pt then retrieved drink and poured into cup at MOD I level placing lid on top after. Pt the retrieved towels and ambulated with supervision to laundry room carrying towels in Rt hand. Completed laundry simulated task at MOD I level placing and retrieving items into washer and dryer. Stood at MOD I using BUE to fold towels then bring them back to room. Pt then ambulated around room with supervision to "clean up" as she reported it was cluttered. Pt then practiced making bed in standing and completed at MOD I level. Pt sat for rest break and simulated writing check using adaptive tube for grip.   Therapy Documentation Precautions:  Precautions Precautions: Fall Precaution Comments: dizziness with positional changes continues Restrictions Weight Bearing Restrictions: No General:   Vital Signs:   Pain: No c/o pain during therapy session.   See FIM for current functional status  Therapy/Group: Individual Therapy  Daneil Dan 01/12/2013, 12:18 PM

## 2013-01-12 NOTE — Patient Care Conference (Signed)
Inpatient RehabilitationTeam Conference and Plan of Care Update Date: 01/12/2013   Time: 10:35 AM    Patient Name: Michele Diaz      Medical Record Number: [id number]  Date of Birth: Dec 21, 1931 Sex: Female         Room/Bed: 4151/4151-01 Payor Info: Payor: MEDICARE  Plan: MEDICARE PART A AND B  Product Type: *No Product type*     Admitting Diagnosis: L CVA  Admit Date/Time:  01/10/2013  3:13 PM Admission Comments: No comment available   Primary Diagnosis:  CVA (cerebral infarction) Principal Problem: CVA (cerebral infarction)  Patient Active Problem List   Diagnosis Date Noted  . CVA (cerebral infarction) 01/10/2013  . Stroke 01/07/2013  . Well adult exam 06/01/2012  . Headache 03/02/2012  . Grief reaction 09/19/2011  . Memory deficit 05/26/2011  . Chest pain 04/24/2011  . HYPONATREMIA 07/16/2010  . DEPRESSION/ANXIETY 04/03/2010  . FATIGUE 03/06/2010  . WEIGHT LOSS 03/06/2010  . VERTIGO 10/23/2009  . LIVER FUNCTION TESTS, ABNORMAL, HX OF 08/17/2009  . CHEST WALL PAIN 04/06/2008  . GERD 10/15/2007  . Other specified acquired hypothyroidism 10/06/2007  . HYPOTHYROIDISM 07/19/2007  . HYPERTENSION 07/19/2007  . ALLERGIC RHINITIS 07/19/2007  . DIVERTICULOSIS, COLON 07/19/2007  . OSTEOARTHRITIS 07/19/2007  . BREAST CANCER, HX OF 07/19/2007  . VITAMIN D DEFICIENCY 06/09/2007  . Polymyalgia rheumatica 06/09/2007    Expected Discharge Date: Expected Discharge Date: 01/14/13  Team Members Present: Physician leading conference: Dr. Claudette Laws Social Worker Present: Dossie Der, LCSW Nurse Present: Gregor Hams, RN PT Present: Edman Circle, PT;Becky Henrene Dodge, PT;Caroline Revere, PT;Other (comment) Clarisse Gouge Ripa-PT) OT Present: Other (comment) Dorathy Daft Perkinson-OT) SLP Present: Fae Pippin, SLP Other (Discipline and Name): marie Noel-PPS & Midwife     Current Status/Progress Goal Weekly Team Focus  Medical   Poor balance, decreased safety awareness  Improved  balance and safety awareness  Discharge planning   Bowel/Bladder   pt continent of B&B  pt will remain continent of B&B  pt will remain continent of B&B   Swallow/Nutrition/ Hydration     na        ADL's     supervision level   mod/i   safety, balance and visual issues  Mobility   supervision-min A overall  mod I  Safety, higher level balance and gait training   Communication     na        Safety/Cognition/ Behavioral Observations    na        Pain   pt denies pain  pain <3  pt will remain pain free   Skin   skin free from infection/breakdown  skin will remain free from infection/breakdown  skin will remain free from infection/breakdown      *See Care Plan and progress notes for long and short-term goals.  Barriers to Discharge: See above    Possible Resolutions to Barriers:  Make modified independent in room tomorrow and if doing well discharge on 01/14/2013    Discharge Planning/Teaching Needs:  Home with intermittent assist from sister and son's to check on.  Close to goal level      Team Discussion:  Dizziness better but still has visual deficits.  Team feels ready Friday-recommend OP therapies  Revisions to Treatment Plan:  Meeting goals quickly-short length of stay   Continued Need for Acute Rehabilitation Level of Care: The patient requires daily medical management by a physician with specialized training in physical medicine and rehabilitation for the following conditions: Daily direction of a  multidisciplinary physical rehabilitation program to ensure safe treatment while eliciting the highest outcome that is of practical value to the patient.: Yes Daily medical management of patient stability for increased activity during participation in an intensive rehabilitation regime.: Yes Daily analysis of laboratory values and/or radiology reports with any subsequent need for medication adjustment of medical intervention for : Neurological problems  Tasha Jindra, Lemar Livings 01/12/2013, 3:23 PM

## 2013-01-13 ENCOUNTER — Inpatient Hospital Stay (HOSPITAL_COMMUNITY): Payer: Medicare Other | Admitting: Physical Therapy

## 2013-01-13 ENCOUNTER — Inpatient Hospital Stay (HOSPITAL_COMMUNITY): Payer: Medicare Other | Admitting: Occupational Therapy

## 2013-01-13 ENCOUNTER — Inpatient Hospital Stay (HOSPITAL_COMMUNITY): Payer: Medicare Other

## 2013-01-13 ENCOUNTER — Inpatient Hospital Stay (HOSPITAL_COMMUNITY): Payer: Medicare Other | Admitting: *Deleted

## 2013-01-13 ENCOUNTER — Encounter (HOSPITAL_COMMUNITY): Payer: Medicare Other

## 2013-01-13 DIAGNOSIS — I6330 Cerebral infarction due to thrombosis of unspecified cerebral artery: Secondary | ICD-10-CM

## 2013-01-13 DIAGNOSIS — G8110 Spastic hemiplegia affecting unspecified side: Secondary | ICD-10-CM

## 2013-01-13 MED ORDER — ENSURE COMPLETE PO LIQD
120.0000 mL | Freq: Four times a day (QID) | ORAL | Status: DC
  Administered 2013-01-13 – 2013-01-14 (×3): 120 mL via ORAL

## 2013-01-13 NOTE — Consult Note (Signed)
NEUROCOGNITIVE STATUS EXAMINATION - [facility]   [name] is a 79 year old, widowed, Caucasian woman who was seen for a neurocognitive status examination to assess her emotional state and mental status post- stroke.  According to her medical record, she was admitted on 01/07/2013 with right-sided weakness over 2-3 days as well as bouts of vertigo.  MRI of the brain revealed small acute infarct in the left parietal white matter.  MRA of the head indicated mild stenosis of the parietal branch of the left middle cerebral artery.    Emotional Functioning:  During the clinical interview, [name] described lifelong struggles with anxiety, including excessive worry over various situations.  For example, the organization of her home, organization and management of finances, and what effect her mood state could have on her family members.  She commented that she has to "try and be positive" for her children.  Regarding her current mood state, since her stroke, she remarked multiple times that she feels as though she is coping well.  She stated that she is more concerned with how to prevent future strokes than she is with effects from the current stroke.  We discussed stroke prevention and she seemed to be well informed about the benefits of proper diet and exercise, etc.  She also seems to have a realistic plan about what she will do to change her diet and exercise regimen when she returns home.  [name] denied any suicidal ideation and reported having high energy and motivation to engage in pleasurable activities.  She remarked that when it is gloomy outside, her attitude can become more pessimistic, but that when that happens, she typically gets out of the house to help improve her mood.  [name]' responses to self-report indices of mood disruption were suggestive of the presence of mild anxiety, but were not indicative of depressed mood at this time.    Mental  Status:  [name]' total score on an overall measure of mental status was not suggestive of the presence of significant cognitive impairment at this time (MMSE-2 brief = 13/16).  After immediately registering 3 words into memory, she was able to freely recall 2 of 3 words after a brief delay.  She lost additional points for misstating the season of the year and the date.  Subjectively, [name] acknowledged that she has had increased forgetfulness recently (e.g. misplacing items) and that she has had difficulty focusing her attention since her husband passed away 1.5 years ago.  Of note, she was unable to say specifically whether she had noticed cognitive changes since her stroke.    Impressions and Recommendations:  Ms.  Michele Diaz' overall neurocognitive functioning does not seem to be impaired at the level of dementia.  However, owing to her subjective complaints, she was advised that she could speak with her primary care physician about obtaining comprehensive neuropsychological evaluation as an outpatient.  Per her request, Dr. Georjean Mode contact information should be included on her discharge paperwork to accomplish this, should she choose to do so.  [name]' mood appears to be anxious and this seems to be a longstanding anxiety disorder, rather than an acute reaction to her stroke.  However, the magnitude of her anxiety seems to be mild and as such, she is likely not appropriate for an anti-anxiety medication for treatment.  Rather, she should be encouraged to follow through on plans to increase physical and social activity post-discharge to help improve mood.  She might also benefit  from participation in individual psychotherapy in order to provide her with regular social support and to help her develop better coping strategies to manage life stressors.    DIAGNOSES: Stroke syndrome Anxiety disorder, NOS  Leavy Cella, Psy.D.  Clinical Neuropsychologist

## 2013-01-13 NOTE — Progress Notes (Signed)
Physical Therapy Session Note  Patient Details  Name: Michele Diaz MRN: [id number] Date of Birth: Jun 28, 1932  Today's Date: 01/13/2013 Time: 1430-1530 Time Calculation (min): 60 min  Short Term Goals: Week 1:  PT Short Term Goal 1 (Week 1): = LTG secondary to short LOS  Skilled Therapeutic Interventions/Progress Updates:     Pt engaged in stride right group working on leg strengthening exercises, Nu Step for endurance and strength training, and group static and dynamic balance activities standing at the mat table.  Gait to and from room 150'x 2 with supervision at times for mildly staggering gait pattern.  Berg Completed 55/56.    Therapy Documentation Precautions:  Precautions Precautions: Fall Precaution Comments: dizziness with positional changes continues Restrictions Weight Bearing Restrictions: No General:   Vital Signs: Therapy Vitals Temp: 98.2 F (36.8 C) Temp src: Oral Pulse Rate: 68 Resp: 18 BP: 144/84 mmHg Patient Position, if appropriate: Sitting Oxygen Therapy SpO2: 98 % O2 Device: None (Room air) Pain:   Mobility: Bed Mobility Bed Mobility: Supine to Sit;Sit to Supine Rolling Right: 7: Independent Supine to Sit: 7: Independent Sit to Supine: 7: Independent Transfers Sit to Stand: 6: Modified independent (Device/Increase time) Stand to Sit: 6: Modified independent (Device/Increase time) Stand Pivot Transfers: 6: Modified independent (Device/Increase time) Locomotion :    Trunk/Postural Assessment : Cervical Assessment Cervical Assessment: Within Functional Limits Thoracic Assessment Thoracic Assessment: Within Functional Limits Lumbar Assessment Lumbar Assessment: Within Functional Limits Postural Control Postural Control: Within Functional Limits  Balance: Berg Balance Test Sit to Stand: Able to stand without using hands and stabilize independently Standing Unsupported: Able to stand safely 2 minutes Sitting with Back Unsupported but Feet  Supported on Floor or Stool: Able to sit safely and securely 2 minutes Stand to Sit: Sits safely with minimal use of hands Transfers: Able to transfer safely, minor use of hands Standing Unsupported with Eyes Closed: Able to stand 10 seconds safely Standing Ubsupported with Feet Together: Able to place feet together independently and stand 1 minute safely From Standing, Reach Forward with Outstretched Arm: Can reach confidently >25 cm (10") From Standing Position, Pick up Object from Floor: Able to pick up shoe safely and easily From Standing Position, Turn to Look Behind Over each Shoulder: Looks behind from both sides and weight shifts well Turn 360 Degrees: Able to turn 360 degrees safely in 4 seconds or less Standing Unsupported, Alternately Place Feet on Step/Stool: Able to stand independently and safely and complete 8 steps in 20 seconds Standing Unsupported, One Foot in Front: Able to place foot tandem independently and hold 30 seconds Standing on One Leg: Able to lift leg independently and hold 5-10 seconds Total Score: 55 Exercises:   Other Treatments:    See FIM for current functional status  Therapy/Group: Group Therapy  Miguelina Fore B. Doctor Sheahan, PT, DPT [phone]   01/13/2013, 4:51 PM

## 2013-01-13 NOTE — Discharge Summary (Signed)
  Discharge summary job # [id number]

## 2013-01-13 NOTE — Discharge Summary (Signed)
NAMESABINA, BEAVERS                  ACCOUNT NO.:  [id number]  MEDICAL RECORD NO.:  [id number]  LOCATION:  4151                         FACILITY:  [facility]  PHYSICIAN:  Erick Colace, M.D.DATE OF BIRTH:  06/27/1932  DATE OF ADMISSION:  01/10/2013 DATE OF DISCHARGE:  01/14/2013                              DISCHARGE SUMMARY   DISCHARGE DIAGNOSES: 1. Left parietal thrombotic infarction. 2. Subcutaneous Lovenox for deep venous thrombosis prophylaxis,     depression, history of rheumatoid arthritis, E. coli urinary tract     infection resolved, hypothyroidism, gastroesophageal reflux     disease, and vertigo.  HISTORY OF PRESENT ILLNESS:  This is a 79 year old female with history of rheumatoid arthritis, breast cancer with radiation therapy admitted January 07, 2013 with right-sided weakness and bouts of vertigo.  MRI of the brain showed small acute infarct, left parietal white matter.  MRA of the head with mild stenosis of the parietal branch of the left middle cerebral artery.  Echocardiogram with ejection fraction of 60% without emboli.  Carotid Dopplers with no ICA stenosis.  The patient did not receive tPA.  Neurology Service was consulted, placed on Plavix therapy for stroke prophylaxis as well as subcutaneous Lovenox for DVT prophylaxis.  Antivert added for bouts of dizziness.  Urine culture greater than 100,000 E. coli, placed on Cipro January 08, 2013.  Physical and occupational therapy ongoing.  The patient was admitted for comprehensive rehab program.  PAST MEDICAL HISTORY:  See discharge diagnoses.  SOCIAL HISTORY:  Lives alone, 1 level home.  Functional history prior to admission was independent, retired.  Functional status upon admission to rehab services was minimal assist to ambulate 100 feet with a rolling walker.  PHYSICAL EXAMINATION:  VITAL SIGNS:  Blood pressure 149/62, pulse 61, temperature 97.6, respirations 18. GENERAL:  This was an alert female, oriented x3,  followed full commands. HEENT:  Pupils round and reactive to light. LUNGS:  Clear to auscultation. CARDIAC:  Regular rate and rhythm. ABDOMEN:  Soft, nontender.  Good bowel sounds. NEUROLOGIC:  She was moving all extremities with right-sided hemiparesis.  REHABILITATION HOSPITAL COURSE:  The patient was admitted to inpatient rehab services with therapies initiated on a 3-hour daily basis consisting of physical therapy, occupational therapy, and rehabilitation nursing.  The following issues were addressed during the patient's rehabilitation stay.  Pertaining Ms. Gaunt left parietal thrombotic infarct remained stable, maintained on Plavix therapy.  She continued on subcutaneous Lovenox for DVT prophylaxis.  She did have history of depression.  She continued on Remeron as well as Xanax with emotional support provided.  History of rheumatoid arthritis, maintained on prednisone as well as Imuran with no flare-ups noted.  She was completing a course of Cipro for E. coli urinary tract infection.  She remained on hormone supplement for hypothyroidism.  She was on low-dose Antivert for bouts of vertigo with good results.  The patient received weekly collaborative interdisciplinary team conferences to discuss estimated length of stay, family teaching, and any barriers to discharge.  She was continent of bowel and bladder, supervision level for activities of daily living, supervision minimal assist overall for functional mobility.  She is to be discharged to  home with intermittent assistance from sister and son to check on her.  Ongoing therapies were dictated as per rehab services.  DISCHARGE MEDICATIONS: 1. Xanax 0.25 to 0.5 mg p.o. t.i.d. as needed. 2. Imuran 50 mg p.o. daily. 3. Vitamin D 1000 units p.o. daily. 4. Plavix 75 mg p.o. daily. 5. Synthroid 88 mcg p.o. daily. 6. Antivert 12.5 mg p.o. b.i.d. 7. Remeron 7.5 mg p.o. at bedtime. 8. Protonix 40 mg p.o. daily. 9. Prednisone 5 mg  p.o. daily.  DIET:  Regular.  SPECIAL INSTRUCTIONS:  The patient would follow up with Dr. Claudette Laws at the outpatient rehab center as directed, Dr. Delia Heady in 1 month, Neurology Services, call for appointment.  Dr. Posey Rea, medical management, appointment to be made.  Ongoing therapies were arranged.     Mariam Dollar, P.A.   ______________________________ Erick Colace, M.D.    DA/MEDQ  D:  01/13/2013  T:  01/13/2013  Job:  [id number]  cc:   Pramod P. Pearlean Brownie, MD Georgina Quint [name], MD

## 2013-01-13 NOTE — Progress Notes (Signed)
Patient ID: Michele Diaz, female   DOB: Oct 21, 1931, 79 y.o.   MRN: [id number] Subjective/Complaints: 79 y.o. female with history of rheumatoid arthritis, hypertension, breast cancer with lumpectomy and radiation therapy. Admitted 01/07/2013 with right-sided weakness x2-3 days as well as bouts of vertigo. MRI of the brain showed small acute infarct left parietal white matter. MRA of the head mild stenosis of the parietal branch of the left middle cerebral artery. Echocardiogram with ejection fraction of 60% without emboli. Carotid Dopplers with no ICA stenosis. Patient did not receive TPA. Neurology services consulted placed on Plavix therapy for stroke prophylaxis as well as subcutaneous Lovenox for DVT prophylaxis. Antivert was added for bouts of dizziness. Urine culture greater than 100,000 Escherichia coli placed on Cipro 01/08/2013  Pleased about discharge date on 5/2 No significant issues going on. Should be modified independent in room today May get some supervision at home from family members Review of Systems  Neurological: Positive for sensory change.       Right hand  All other systems reviewed and are negative.    Objective: Vital Signs: Blood pressure 132/77, pulse 61, temperature 97.5 F (36.4 C), temperature source Oral, resp. rate 17, height 5\' 2"  (1.575 m), weight 51.9 kg (114 lb 6.7 oz), SpO2 98.00%. No results found. Results for orders placed during the hospital encounter of 01/10/13 (from the past 72 hour(s))  CBC     Status: Abnormal   Collection Time    01/10/13  3:58 PM      Result Value Range   WBC 7.8  4.0 - 10.5 K/uL   RBC 4.11  3.87 - 5.11 MIL/uL   Hemoglobin 12.4  12.0 - 15.0 g/dL   HCT 40.9 (*) 81.1 - 91.4 %   MCV 85.9  78.0 - 100.0 fL   MCH 30.2  26.0 - 34.0 pg   MCHC 35.1  30.0 - 36.0 g/dL   RDW 78.2  95.6 - 21.3 %   Platelets 130 (*) 150 - 400 K/uL  CREATININE, SERUM     Status: Abnormal   Collection Time    01/10/13  3:58 PM      Result Value Range    Creatinine, Ser 0.92  0.50 - 1.10 mg/dL   GFR calc non Af Amer 58 (*) >90 mL/min   GFR calc Af Amer 67 (*) >90 mL/min   Comment:            The eGFR has been calculated     using the CKD EPI equation.     This calculation has not been     validated in all clinical     situations.     eGFR's persistently     <90 mL/min signify     possible Chronic Kidney Disease.  CBC WITH DIFFERENTIAL     Status: Abnormal   Collection Time    01/11/13  6:08 AM      Result Value Range   WBC 5.8  4.0 - 10.5 K/uL   RBC 4.12  3.87 - 5.11 MIL/uL   Hemoglobin 12.3  12.0 - 15.0 g/dL   HCT 08.6 (*) 57.8 - 46.9 %   MCV 86.2  78.0 - 100.0 fL   MCH 29.9  26.0 - 34.0 pg   MCHC 34.6  30.0 - 36.0 g/dL   RDW 62.9  52.8 - 41.3 %   Platelets 124 (*) 150 - 400 K/uL   Neutrophils Relative 67  43 - 77 %   Neutro Abs 3.9  1.7 - 7.7 K/uL   Lymphocytes Relative 20  12 - 46 %   Lymphs Abs 1.2  0.7 - 4.0 K/uL   Monocytes Relative 9  3 - 12 %   Monocytes Absolute 0.5  0.1 - 1.0 K/uL   Eosinophils Relative 3  0 - 5 %   Eosinophils Absolute 0.2  0.0 - 0.7 K/uL   Basophils Relative 0  0 - 1 %   Basophils Absolute 0.0  0.0 - 0.1 K/uL  COMPREHENSIVE METABOLIC PANEL     Status: Abnormal   Collection Time    01/11/13  6:08 AM      Result Value Range   Sodium 137  135 - 145 mEq/L   Potassium 3.7  3.5 - 5.1 mEq/L   Chloride 101  96 - 112 mEq/L   CO2 26  19 - 32 mEq/L   Glucose, Bld 79  70 - 99 mg/dL   BUN 13  6 - 23 mg/dL   Creatinine, Ser 4.54  0.50 - 1.10 mg/dL   Calcium 9.6  8.4 - 09.8 mg/dL   Total Protein 8.1  6.0 - 8.3 g/dL   Albumin 3.3 (*) 3.5 - 5.2 g/dL   AST 21  0 - 37 U/L   ALT 8  0 - 35 U/L   Alkaline Phosphatase 58  39 - 117 U/L   Total Bilirubin 0.8  0.3 - 1.2 mg/dL   GFR calc non Af Amer 83 (*) >90 mL/min   GFR calc Af Amer >90  >90 mL/min   Comment:            The eGFR has been calculated     using the CKD EPI equation.     This calculation has not been     validated in all clinical      situations.     eGFR's persistently     <90 mL/min signify     possible Chronic Kidney Disease.     HEENT: normal Cardio: RRR and no murmurs Resp: CTA B/L and unlabored GI: BS positive and non tender Extremity:  Pulses positive and No Edema Skin:   Intact Neuro: Anxious, Cranial Nerve II-XII normal, Abnormal Sensory reduced dorsum R hnad, Abnormal Motor 4-/5 R delt,bi,tri,grip LUE and BLE 5/5 and Abnormal FMC Ataxic/ dec FMC Musc/Skel:  Normal Gen NAD   Assessment/Plan: 1. Functional deficits secondary to Left parietal infarct which require 3+ hours per day of interdisciplinary therapy in a comprehensive inpatient rehab setting. Physiatrist is providing close team supervision and 24 hour management of active medical problems listed below. Physiatrist and rehab team continue to assess barriers to discharge/monitor patient progress toward functional and medical goals. FIM: FIM - Bathing Bathing Steps Patient Completed: Chest;Right Arm;Left Arm;Abdomen;Front perineal area;Buttocks;Right upper leg;Left upper leg;Right lower leg (including foot);Left lower leg (including foot) Bathing: 5: Supervision: Safety issues/verbal cues  FIM - Upper Body Dressing/Undressing Upper body dressing/undressing steps patient completed: Thread/unthread right sleeve of pullover shirt/dresss;Thread/unthread left sleeve of pullover shirt/dress;Put head through opening of pull over shirt/dress;Pull shirt over trunk Upper body dressing/undressing: 5: Supervision: Safety issues/verbal cues FIM - Lower Body Dressing/Undressing Lower body dressing/undressing steps patient completed: Thread/unthread right underwear leg;Thread/unthread left underwear leg;Pull underwear up/down;Thread/unthread right pants leg;Thread/unthread left pants leg;Pull pants up/down;Don/Doff right sock;Don/Doff left sock;Don/Doff right shoe;Don/Doff left shoe;Fasten/unfasten right shoe;Fasten/unfasten left shoe Lower body dressing/undressing:  5: Supervision: Safety issues/verbal cues  FIM - Toileting Toileting steps completed by patient: Adjust clothing prior to toileting;Performs perineal hygiene;Adjust clothing after  toileting Toileting: 5: Supervision: Safety issues/verbal cues  FIM - Archivist Transfers Assistive Devices: Grab bars Toilet Transfers: 5-To toilet/BSC: Supervision (verbal cues/safety issues);5-From toilet/BSC: Supervision (verbal cues/safety issues)  FIM - Press photographer Assistive Devices: HOB elevated Bed/Chair Transfer: 7: Supine > Sit: No assist;7: Sit > Supine: No assist;5: Bed > Chair or W/C: Supervision (verbal cues/safety issues);5: Chair or W/C > Bed: Supervision (verbal cues/safety issues)  FIM - Locomotion: Wheelchair Locomotion: Wheelchair: 0: Activity did not occur FIM - Locomotion: Ambulation Ambulation/Gait Assistance: 5: Supervision Locomotion: Ambulation: 5: Travels 150 ft or more with supervision/safety issues  Comprehension Comprehension Mode: Auditory Comprehension: 6-Follows complex conversation/direction: With extra time/assistive device  Expression Expression Mode: Verbal Expression: 6-Expresses complex ideas: With extra time/assistive device  Social Interaction Social Interaction: 6-Interacts appropriately with others with medication or extra time (anti-anxiety, antidepressant).  Problem Solving Problem Solving: 5-Solves complex 90% of the time/cues < 10% of the time  Memory Memory: 5-Recognizes or recalls 90% of the time/requires cueing < 10% of the time Medical Problem List and Plan:  1. Left parietal, thrombotic infarct  2. DVT Prophylaxis/Anticoagulation: Subcutaneous Lovenox. Monitor platelet counts any signs of bleeding  3. Pain Management: Tylenol  4. Mood: Remeron 7.5 mg each bedtime, Xanax 0.25-0.5 mg 3 times a day as needed. Provide emotional support and positive reinforcement  5. Neuropsych: This patient is capable of making  decisions on his/her own behalf.  6. Rheumatoid arthritis. Prednisone 5 mg daily, Imuran 50 mg daily. Monitor for any signs of flareup  7. E. coli urine tract infection. Cipro initiated 01/08/2013.  8. Hypothyroidism. Synthroid  9. GERD. Protonix  10. Vertigo. Antivert 12.5 mg twice a day and will titrate as needed. May consider vestibular evaluation    LOS (Days) 3 A FACE TO FACE EVALUATION WAS PERFORMED  KIRSTEINS,ANDREW E 01/13/2013, 8:39 AM

## 2013-01-13 NOTE — Progress Notes (Signed)
Physical Therapy Session Note  Patient Details  Name: Michele Diaz MRN: [id number] Date of Birth: Jan 25, 1932  Today's Date: 01/13/2013 Time:  9:45-10:29 ( )   Short Term Goals: Week 1:  PT Short Term Goal 1 (Week 1): = LTG secondary to short LOS  Skilled Therapeutic Interventions/Progress Updates:  Tx focused on community gait and mobility. Pt able to easily demonstrate Mod I mobility and gait in room and controlled setting, including transfers with only increased time required. Bed mobility Independent and home setting gait Mod I with no LOB.   Pt able to demonstrate Mod I with mobility and gait >200' in controlled environment, but needing S in community and outdoors >300' due to uneven terrains.   Pt was able to navigate elevator doors with safe timing, demonstrate quick stop/starts, and gait with head turns and busy environments with minimal LOB and independent with safe recovery. Pt ambulated on brick, inclines/declines, and grass. Ascend/descended 13 stairs with 1 rail and S with cane and alternating between reciprocal and step-to patterns due to fatigue. Pt needing 3 seated rest breaks due to fatigue.   Pt able to demonstrate Mod I balance during functional tasks, in room mobility, and picking up objects from floor.  Pt Mod I for transfers with increased effort from variety of heights and surfaces.       Therapy Documentation Precautions:  Precautions Precautions: Fall Precaution Comments: dizziness with positional changes continues Restrictions Weight Bearing Restrictions: No    Pain: None    See FIM for current functional status  Therapy/Group: Individual Therapy  Clydene Laming, PT, DPT   Eulogio Ditch, Charnika Herbst M 01/13/2013, 10:18 AM

## 2013-01-13 NOTE — Progress Notes (Addendum)
Physical Therapy Discharge Summary  Patient Details  Name: Michele Diaz MRN: [id number] Date of Birth: 07/28/1932  Today's Date: 01/13/2013  Patient has met 7 of 7 long term goals due to improved balance, improved postural control and improved coordination. Pt continues to demonstrate increased weakness on R-side.   Patient to discharge at an ambulatory level Modified Independent.   Patient's family plans to provide Supervision assistance if needed, especially in the community setting.   Recommendation:  Patient will benefit from ongoing skilled PT services in outpatient setting to continue to advance safe functional mobility, address ongoing impairments in strength and balance, and minimize fall risk.  Equipment: No equipment provided  Reasons for discharge: treatment goals met  Patient/family agrees with progress made and goals achieved: Yes  PT Discharge Precautions/Restrictions Restrictions Weight Bearing Restrictions: No   Pain: None   Vision/Perception  Vision - History Baseline Vision: No visual deficits Patient Visual Report: No change from baseline Perception Perception: Within Functional Limits Praxis Praxis: Intact  Cognition Overall Cognitive Status: Within Functional Limits for tasks assessed Arousal/Alertness: Awake/alert Orientation Level: Oriented X4 Sensation Sensation Light Touch: Impaired Detail Light Touch Impaired Details: Impaired RLE Additional Comments: Reports decreased sensation to light touch RLE Coordination Gross Motor Movements are Fluid and Coordinated: Yes Motor  Motor Motor: Hemiplegia Motor - Skilled Clinical Observations: R hemiplegia  Motor - Discharge Pt continued to demonstrate R-sided weakness throughout RLE, but has functional strength for safe mobility.   Mobility Bed Mobility Bed Mobility: Supine to Sit;Sit to Supine Rolling Right: 7: Independent Supine to Sit: 7: Independent Sit to Supine: 7: Independent Transfers Sit  to Stand: 6: Modified independent (Device/Increase time) Stand to Sit: 6: Modified independent (Device/Increase time) Stand Pivot Transfers: 6: Modified independent (Device/Increase time) Locomotion  Ambulation Ambulation/Gait Assistance: 6: Modified independent (Device/Increase time) Ambulation Distance (Feet): 150 Feet Assistive device: None Stairs / Additional Locomotion Stairs: Yes Stairs Assistance: 6: Modified independent (Device/Increase time) Stair Management Technique: No rails Number of Stairs: 6 Wheelchair Mobility Wheelchair Mobility: No  Trunk/Postural Assessment  Cervical Assessment Cervical Assessment: Within Functional Limits Thoracic Assessment Thoracic Assessment: Within Functional Limits Lumbar Assessment Lumbar Assessment: Within Functional Limits Postural Control Postural Control: Within Functional Limits  Balance: See afternoon PT note for final Berg balance test   Extremity Assessment      RLE Assessment RLE Assessment: Exceptions to [facility] RLE Strength RLE Overall Strength: Deficits RLE Overall Strength Comments: 3+/5 throughout LLE Assessment LLE Assessment: Exceptions to [facility] LLE Strength LLE Overall Strength: Deficits;Due to premorbid status LLE Overall Strength Comments: OA hip and knee; 4+/5 throughout except hip flexion and knee extension 3+/5  See FIM for current functional status Clydene Laming, PT, DPT  01/13/2013, 12:53 PM

## 2013-01-13 NOTE — Progress Notes (Signed)
Occupational Therapy Discharge Summary  Patient Details  Name: Michele Diaz MRN: [id number] Date of Birth: [birth year]  Today's Date: 01/13/2013 Time: 1100-1200 Time Calculation (min): 60 min  Patient has met 13 of 13 long term goals due to improved activity tolerance, improved balance, ability to compensate for deficits, functional use of  RIGHT upper extremity and improved coordination.  Patient to discharge at overall Modified Independent level.     Reasons goals not met: n/a secondary to all goals met  Recommendation:  Patient will benefit from ongoing skilled OT services in outpatient setting to continue to advance functional skills in the area of iADL.  Equipment: No equipment provided  Reasons for discharge: treatment goals met and discharge from hospital  Patient/family agrees with progress made and goals achieved: Yes  OT Discharge Precautions/Restrictions  None noted Pain Denies pain Cognition Overall Cognitive Status: Within Functional Limits for tasks assessed Arousal/Alertness: Awake/alert Orientation Level: Oriented X4 Sensation Sensation Light Touch: Appears Intact (BUEs) Proprioception: Impaired Detail (RUE) Proprioception Impaired Details: Impaired RUE (WFL except in fingers & thumb) Coordination Gross Motor Movements are Fluid and Coordinated: No Fine Motor Movements are Fluid and Coordinated: No Motor  Motor Motor: Hemiplegia Motor - Skilled Clinical Observations: R hemiplegia Motor - Discharge Observations: Pt continued to demonstrate R-sided weakness throughout RLE and RUE, but has functional strength.  Mobility  Bed Mobility Bed Mobility: Supine to Sit;Sit to Supine Rolling Right: 7: Independent Supine to Sit: 7: Independent Sit to Supine: 7: Independent Transfers Sit to Stand: 6: Modified independent (Device/Increase time) Stand to Sit: 6: Modified independent (Device/Increase time)  Trunk/Postural Assessment  Cervical Assessment Cervical  Assessment: Within Functional Limits Thoracic Assessment Thoracic Assessment: Within Functional Limits Lumbar Assessment Lumbar Assessment: Within Functional Limits Postural Control Postural Control: Within Functional Limits  Balance Berg Balance Test Sit to Stand: Able to stand without using hands and stabilize independently Standing Unsupported: Able to stand safely 2 minutes Sitting with Back Unsupported but Feet Supported on Floor or Stool: Able to sit safely and securely 2 minutes Stand to Sit: Sits safely with minimal use of hands Transfers: Able to transfer safely, minor use of hands Standing Unsupported with Eyes Closed: Able to stand 10 seconds safely Standing Ubsupported with Feet Together: Able to place feet together independently and stand 1 minute safely From Standing, Reach Forward with Outstretched Arm: Can reach confidently >25 cm (10") From Standing Position, Pick up Object from Floor: Able to pick up shoe safely and easily From Standing Position, Turn to Look Behind Over each Shoulder: Looks behind from both sides and weight shifts well Turn 360 Degrees: Able to turn 360 degrees safely in 4 seconds or less Standing Unsupported, Alternately Place Feet on Step/Stool: Able to stand independently and safely and complete 8 steps in 20 seconds Standing Unsupported, One Foot in Front: Able to place foot tandem independently and hold 30 seconds Standing on One Leg: Able to lift leg independently and hold 5-10 seconds Total Score: 55 Extremity/Trunk Assessment RUE Assessment RUE Assessment: Exceptions to [facility] RUE AROM (degrees) RUE Overall AROM Comments: grossly WFL except shoulder internal rotation and arthritic fingers and thumb.  Patient reports premorbidly, middle finger "gets stuck when I bend it" RUE Strength RUE Overall Strength Comments: Unable to full assess due to arthritic pain, overall 3+/4 and weak grip (especially thumb and forefinger) and using built up foam  handle for eating. LUE Assessment LUE Assessment: Exceptions to [facility] LUE Strength LUE Overall Strength Comments: Overall 4-/5 overall   See  FIM for current functional status  Lounell Schumacher 01/13/2013, 4:55 PM

## 2013-01-13 NOTE — Progress Notes (Addendum)
Occupational Therapy Session Note  Patient Details  Name: Michele Diaz MRN: [id number] Date of Birth: 1932/04/07  Today's Date: 01/13/2013 Time: 1400-1430 Time Calculation (min): 30 min  Short Term Goals: STGs=LTGs  Skilled Therapeutic Interventions/Progress Updates:    Therapy session focused on dynamic standing balance, safety, FM skills, and higher level IADLs for anticipation of discharge home tomorrow. Pt completed toileting task at Mod I level. Pt ambulated to ADL kitchen without AD to participate in stove top meal prep task. Pt retrieved items from low cabinets, waist level drawers, and refrigerator. Demonstrated good safety at stovetop. Completed meal prep task in standing at Mod I level. Pt required increased time to open cheese packet secondary to decreased grip strength. Used BUE throughout tasks without cues. At end of session pt washed dishes and placed them back in cabinets and drawers. Pt reports no questions or concerns about discharge home.   Therapy Documentation Precautions:  Precautions Precautions: Fall Precaution Comments: dizziness with positional changes continues Restrictions Weight Bearing Restrictions: No General:   Vital Signs:   Pain: No c/o pain during therapy session  See FIM for current functional status  Therapy/Group: Individual Therapy  Daneil Dan 01/13/2013, 3:09 PM

## 2013-01-14 DIAGNOSIS — G8110 Spastic hemiplegia affecting unspecified side: Secondary | ICD-10-CM

## 2013-01-14 DIAGNOSIS — I6330 Cerebral infarction due to thrombosis of unspecified cerebral artery: Secondary | ICD-10-CM

## 2013-01-14 MED ORDER — MIRTAZAPINE 15 MG PO TABS: 7.5000 mg | ORAL_TABLET | Freq: Every day | ORAL | Status: DC

## 2013-01-14 MED ORDER — PANTOPRAZOLE SODIUM 40 MG PO TBEC: 40.0000 mg | DELAYED_RELEASE_TABLET | Freq: Every day | ORAL | Status: DC

## 2013-01-14 MED ORDER — PREDNISONE 5 MG PO TABS: 5.0000 mg | ORAL_TABLET | Freq: Every day | ORAL | Status: DC

## 2013-01-14 MED ORDER — AZATHIOPRINE 50 MG PO TABS: ORAL_TABLET | ORAL | Status: DC

## 2013-01-14 MED ORDER — LEVOTHYROXINE SODIUM 88 MCG PO TABS: 88.0000 ug | ORAL_TABLET | Freq: Every day | ORAL | Status: DC

## 2013-01-14 MED ORDER — VITAMIN D 1000 UNITS PO TABS: 1000.0000 [IU] | ORAL_TABLET | Freq: Every day | ORAL | Status: AC

## 2013-01-14 MED ORDER — CIPROFLOXACIN HCL 250 MG PO TABS: 250.0000 mg | ORAL_TABLET | Freq: Two times a day (BID) | ORAL | Status: DC

## 2013-01-14 MED ORDER — CLOPIDOGREL BISULFATE 75 MG PO TABS: 75.0000 mg | ORAL_TABLET | Freq: Every day | ORAL | Status: DC

## 2013-01-14 MED ORDER — MECLIZINE HCL 12.5 MG PO TABS: 12.5000 mg | ORAL_TABLET | Freq: Two times a day (BID) | ORAL | Status: DC

## 2013-01-14 MED ORDER — ALPRAZOLAM 0.5 MG PO TABS: 0.2500 mg | ORAL_TABLET | Freq: Three times a day (TID) | ORAL | Status: DC | PRN

## 2013-01-14 NOTE — Progress Notes (Signed)
Social Work Discharge Note Discharge Note  The overall goal for the admission was met for:   Discharge location: Yes-HOME WITH INTERMITTENT CHECKS FROM SISTER AND SON'S  Length of Stay: Yes-4 DAYS  Discharge activity level: Yes-MOD/I-INDEPENDENT LEVEL  Home/community participation: Yes  Services provided included: MD, RD, PT, OT, RN, Pharmacy and SW  Financial Services: Medicare and Private Insurance: BCBS  Follow-up services arranged: Outpatient: [facility]-PT, OT  5/5 12:30-1:45 PM  Comments (or additional information):DID VERY WELL BACK TO PREMORBID STATUS  Patient/Family verbalized understanding of follow-up arrangements: Yes  Individual responsible for coordination of the follow-up plan: SELF & MARY-SISTER  Confirmed correct DME delivered: Lucy Chris 01/14/2013    Lucy Chris

## 2013-01-14 NOTE — Progress Notes (Signed)
Patient ID: Michele Diaz, female   DOB: [birth year], 79 y.o.   MRN: [id number] Subjective/Complaints: 79 y.o. female with history of rheumatoid arthritis, hypertension, breast cancer with lumpectomy and radiation therapy. Admitted 01/07/2013 with right-sided weakness x2-3 days as well as bouts of vertigo. MRI of the brain showed small acute infarct left parietal white matter. MRA of the head mild stenosis of the parietal branch of the left middle cerebral artery. Echocardiogram with ejection fraction of 60% without emboli. Carotid Dopplers with no ICA stenosis. Patient did not receive TPA. Neurology services consulted placed on Plavix therapy for stroke prophylaxis as well as subcutaneous Lovenox for DVT prophylaxis. Antivert was added for bouts of dizziness. Urine culture greater than 100,000 Escherichia coli placed on Cipro 01/08/2013   No significant issues going on. Should be modified independent in room today May get some supervision at home from family members Review of Systems  Neurological: Positive for sensory change.       Right hand  All other systems reviewed and are negative.    Objective: Vital Signs: Blood pressure 114/73, pulse 68, temperature 98 F (36.7 C), temperature source Oral, resp. rate 18, height 5\' 2"  (1.575 m), weight 51.9 kg (114 lb 6.7 oz), SpO2 98.00%. No results found. No results found for this or any previous visit (from the past 72 hour(s)).   HEENT: normal Cardio: RRR and no murmurs Resp: CTA B/L and unlabored GI: BS positive and non tender Extremity:  Pulses positive and No Edema Skin:   Intact Neuro: Anxious, Cranial Nerve II-XII normal, Abnormal Sensory reduced dorsum R hnad, Abnormal Motor 4-/5 R delt,bi,tri,grip LUE and BLE 5/5 and Abnormal FMC Ataxic/ dec FMC Musc/Skel:  Normal Gen NAD   Assessment/Plan: 1. Functional deficits secondary to Left parietal infarct which require 3+ hours per day of interdisciplinary therapy in a comprehensive  inpatient rehab setting. Physiatrist is providing close team supervision and 24 hour management of active medical problems listed below. Physiatrist and rehab team continue to assess barriers to discharge/monitor patient progress toward functional and medical goals. FIM: FIM - Bathing Bathing Steps Patient Completed: Chest;Right Arm;Left Arm;Abdomen;Front perineal area;Buttocks;Right upper leg;Left upper leg;Right lower leg (including foot);Left lower leg (including foot) Bathing: 5: Supervision: Safety issues/verbal cues  FIM - Upper Body Dressing/Undressing Upper body dressing/undressing steps patient completed: Thread/unthread right sleeve of pullover shirt/dresss;Thread/unthread left sleeve of pullover shirt/dress;Put head through opening of pull over shirt/dress;Pull shirt over trunk Upper body dressing/undressing: 5: Supervision: Safety issues/verbal cues FIM - Lower Body Dressing/Undressing Lower body dressing/undressing steps patient completed: Thread/unthread right underwear leg;Thread/unthread left underwear leg;Pull underwear up/down;Thread/unthread right pants leg;Thread/unthread left pants leg;Pull pants up/down;Don/Doff right sock;Don/Doff left sock;Don/Doff right shoe;Don/Doff left shoe;Fasten/unfasten right shoe;Fasten/unfasten left shoe Lower body dressing/undressing: 5: Supervision: Safety issues/verbal cues  FIM - Toileting Toileting steps completed by patient: Adjust clothing prior to toileting;Performs perineal hygiene;Adjust clothing after toileting Toileting: 5: Supervision: Safety issues/verbal cues  FIM - Archivist Transfers Assistive Devices: Grab bars Toilet Transfers: 5-To toilet/BSC: Supervision (verbal cues/safety issues);5-From toilet/BSC: Supervision (verbal cues/safety issues)  FIM - Press photographer Assistive Devices: Arm rests Bed/Chair Transfer: 6: Supine > Sit: No assist;6: Sit > Supine: No assist;6: Bed > Chair or W/C:  No assist;6: Chair or W/C > Bed: No assist  FIM - Locomotion: Wheelchair Locomotion: Wheelchair: 0: Activity did not occur FIM - Locomotion: Ambulation Locomotion: Ambulation Assistive Devices: Other (comment) (None) Ambulation/Gait Assistance: 6: Modified independent (Device/Increase time) Locomotion: Ambulation: 6: Travels 150 ft or more independently/takes  more than reasonable amount of time  Comprehension Comprehension Mode: Auditory Comprehension: 6-Follows complex conversation/direction: With extra time/assistive device  Expression Expression Mode: Verbal Expression: 6-Expresses complex ideas: With extra time/assistive device  Social Interaction Social Interaction: 6-Interacts appropriately with others with medication or extra time (anti-anxiety, antidepressant).  Problem Solving Problem Solving: 5-Solves complex 90% of the time/cues < 10% of the time  Memory Memory: 5-Recognizes or recalls 90% of the time/requires cueing < 10% of the time Medical Problem List and Plan:  1. Left parietal, thrombotic infarct  2. DVT Prophylaxis/Anticoagulation: Subcutaneous Lovenox. Monitor platelet counts any signs of bleeding  3. Pain Management: Tylenol  4. Mood: Remeron 7.5 mg each bedtime, Xanax 0.25-0.5 mg 3 times a day as needed. Provide emotional support and positive reinforcement  5. Neuropsych: This patient is capable of making decisions on his/her own behalf.  6. Rheumatoid arthritis. Prednisone 5 mg daily, Imuran 50 mg daily. Monitor for any signs of flareup  7. E. coli urine tract infection. Cipro initiated 01/08/2013.  8. Hypothyroidism. Synthroid  9. GERD. Protonix  10. Vertigo. Antivert 12.5 mg twice a day and will titrate as needed. May consider vestibular evaluation    LOS (Days) 4 A FACE TO FACE EVALUATION WAS PERFORMED  KIRSTEINS,ANDREW E 01/14/2013, 8:29 AM

## 2013-01-17 ENCOUNTER — Ambulatory Visit: Payer: Medicare Other | Attending: Physical Medicine & Rehabilitation | Admitting: Physical Therapy

## 2013-01-17 DIAGNOSIS — IMO0001 Reserved for inherently not codable concepts without codable children: Secondary | ICD-10-CM | POA: Insufficient documentation

## 2013-01-17 DIAGNOSIS — M6281 Muscle weakness (generalized): Secondary | ICD-10-CM | POA: Diagnosis not present

## 2013-01-17 DIAGNOSIS — R279 Unspecified lack of coordination: Secondary | ICD-10-CM | POA: Insufficient documentation

## 2013-01-21 ENCOUNTER — Encounter: Payer: Self-pay | Admitting: Internal Medicine

## 2013-01-21 ENCOUNTER — Ambulatory Visit (INDEPENDENT_AMBULATORY_CARE_PROVIDER_SITE_OTHER): Payer: Medicare Other | Admitting: Internal Medicine

## 2013-01-21 VITALS — BP 128/90 | HR 76 | Temp 98.3°F | Resp 16 | Wt 112.0 lb

## 2013-01-21 DIAGNOSIS — I6350 Cerebral infarction due to unspecified occlusion or stenosis of unspecified cerebral artery: Secondary | ICD-10-CM | POA: Diagnosis not present

## 2013-01-21 DIAGNOSIS — M353 Polymyalgia rheumatica: Secondary | ICD-10-CM

## 2013-01-21 DIAGNOSIS — I10 Essential (primary) hypertension: Secondary | ICD-10-CM

## 2013-01-21 DIAGNOSIS — R634 Abnormal weight loss: Secondary | ICD-10-CM

## 2013-01-21 DIAGNOSIS — E039 Hypothyroidism, unspecified: Secondary | ICD-10-CM

## 2013-01-21 DIAGNOSIS — I639 Cerebral infarction, unspecified: Secondary | ICD-10-CM

## 2013-01-21 MED ORDER — MECLIZINE HCL 12.5 MG PO TABS: 12.5000 mg | ORAL_TABLET | Freq: Two times a day (BID) | ORAL | Status: DC

## 2013-01-21 NOTE — Assessment & Plan Note (Addendum)
4/14 L parietal infarction  1 cm acute infarct left parietal white matter. No other  acute infarct.   Continue with current prescription therapy as reflected on the Med list: Plavix

## 2013-01-21 NOTE — Assessment & Plan Note (Signed)
Continue with current prescription therapy as reflected on the Med list.  

## 2013-01-21 NOTE — Assessment & Plan Note (Signed)
Doing fair 

## 2013-01-21 NOTE — Assessment & Plan Note (Signed)
Wt Readings from Last 3 Encounters:  01/21/13 112 lb (50.803 kg)  01/12/13 114 lb 6.7 oz (51.9 kg)  01/07/13 109 lb (49.442 kg)

## 2013-01-21 NOTE — Progress Notes (Signed)
Subjective:    HPI Pt is here for post-hosp check up (4/14):  1. Left parietal thrombotic infarction.  2. Subcutaneous Lovenox for deep venous thrombosis prophylaxis,  depression, history of rheumatoid arthritis, E. coli urinary tract  infection resolved, hypothyroidism, gastroesophageal reflux  disease, and vertigo.  HISTORY OF PRESENT ILLNESS: This is a 79 year old female with history  of rheumatoid arthritis, breast cancer with radiation therapy admitted  January 07, 2013 with right-sided weakness and bouts of vertigo. MRI of  the brain showed small acute infarct, left parietal white matter. MRA  of the head with mild stenosis of the parietal branch of the left middle  cerebral artery. Echocardiogram with ejection fraction of 60% without  emboli. Carotid Dopplers with no ICA stenosis. The patient did not  receive tPA. Neurology Service was consulted, placed on Plavix therapy  for stroke prophylaxis as well as subcutaneous Lovenox for DVT  prophylaxis. Antivert added for bouts of dizziness. Urine culture  greater than 100,000 E. coli, placed on Cipro January 08, 2013. Physical  and occupational therapy ongoing. The patient was admitted for  comprehensive rehab program.   The patient presents for a follow-up of  chronic hypertension, chronic dyslipidemia, memory problems controlled with medicines Her husband died in 03-Jan-2013grieving Family is worried about her wt loss - better C/o PMR sx's returned 9/13 she moved to a town house   Wt Readings from Last 3 Encounters:  01/21/13 112 lb (50.803 kg)  01/12/13 114 lb 6.7 oz (51.9 kg)  01/07/13 109 lb (49.442 kg)   BP Readings from Last 3 Encounters:  01/21/13 128/90  01/14/13 114/73  01/10/13 130/63     Review of Systems  Constitutional: Positive for unexpected weight change. Negative for fever, chills, activity change, appetite change and fatigue.  HENT: Negative for congestion, mouth sores and sinus pressure.   Eyes:  Negative for visual disturbance.  Respiratory: Negative for cough and chest tightness.   Gastrointestinal: Negative for nausea and abdominal pain.  Genitourinary: Negative for frequency, difficulty urinating and vaginal pain.  Musculoskeletal: Negative for back pain and gait problem.  Skin: Negative for pallor and rash.  Neurological: Negative for dizziness, tremors, weakness, numbness and headaches.  Psychiatric/Behavioral: Positive for sleep disturbance. Negative for suicidal ideas and confusion. The patient is nervous/anxious.        Objective:   Physical Exam  Constitutional: She appears well-developed. No distress.  thin  HENT:  Head: Normocephalic.  Right Ear: External ear normal.  Left Ear: External ear normal.  Nose: Nose normal.  Mouth/Throat: Oropharynx is clear and moist.  Eyes: Conjunctivae are normal. Pupils are equal, round, and reactive to light. Right eye exhibits no discharge. Left eye exhibits no discharge.  Neck: Normal range of motion. Neck supple. No JVD present. No tracheal deviation present. No thyromegaly present.  Cardiovascular: Normal rate, regular rhythm and normal heart sounds.   Pulmonary/Chest: No stridor. No respiratory distress. She has no wheezes.  Abdominal: Soft. Bowel sounds are normal. She exhibits no distension and no mass. There is no tenderness. There is no rebound and no guarding.  Musculoskeletal: She exhibits no edema and no tenderness.  Lymphadenopathy:    She has no cervical adenopathy.  Neurological: She displays normal reflexes. No cranial nerve deficit. She exhibits normal muscle tone. Coordination normal.  Skin: No rash noted. No erythema.  Psychiatric: Her behavior is normal. Judgment and thought content normal.  sad   Lab Results  Component Value Date   WBC 5.8 01/11/2013  HGB 12.3 01/11/2013   HCT 35.5* 01/11/2013   PLT 124* 01/11/2013   GLUCOSE 79 01/11/2013   CHOL 178 01/08/2013   TRIG 226* 01/08/2013   HDL 58 01/08/2013    LDLDIRECT 125.9 10/06/2007   LDLCALC 75 01/08/2013   ALT 8 01/11/2013   AST 21 01/11/2013   NA 137 01/11/2013   K 3.7 01/11/2013   CL 101 01/11/2013   CREATININE 0.64 01/11/2013   BUN 13 01/11/2013   CO2 26 01/11/2013   TSH 0.824 01/07/2013   INR 1.01 01/07/2013   HGBA1C 5.3 01/08/2013          Assessment & Plan:

## 2013-01-25 ENCOUNTER — Telehealth: Payer: Self-pay

## 2013-01-25 NOTE — Telephone Encounter (Signed)
Pt calls stating she had a heavy nose bleed last night. She questions if she needs to stop taking Plavix? Please advise.

## 2013-01-26 ENCOUNTER — Ambulatory Visit: Payer: Medicare Other | Admitting: Occupational Therapy

## 2013-01-26 DIAGNOSIS — M6281 Muscle weakness (generalized): Secondary | ICD-10-CM | POA: Diagnosis not present

## 2013-01-26 DIAGNOSIS — IMO0001 Reserved for inherently not codable concepts without codable children: Secondary | ICD-10-CM | POA: Diagnosis not present

## 2013-01-26 DIAGNOSIS — R279 Unspecified lack of coordination: Secondary | ICD-10-CM | POA: Diagnosis not present

## 2013-01-26 NOTE — Telephone Encounter (Signed)
No. See ENT if this bleeding re-occurs Thx

## 2013-01-26 NOTE — Telephone Encounter (Signed)
Pt.notified

## 2013-01-28 ENCOUNTER — Encounter: Payer: Medicare Other | Admitting: Physical Therapy

## 2013-02-02 ENCOUNTER — Encounter: Payer: Medicare Other | Admitting: Physical Therapy

## 2013-02-02 ENCOUNTER — Encounter: Payer: Medicare Other | Admitting: *Deleted

## 2013-02-02 NOTE — Progress Notes (Signed)
This encounter was created in error - please disregard.

## 2013-02-03 ENCOUNTER — Encounter: Payer: Medicare Other | Attending: Physical Medicine & Rehabilitation

## 2013-02-03 ENCOUNTER — Encounter: Payer: Self-pay | Admitting: Physical Medicine & Rehabilitation

## 2013-02-03 ENCOUNTER — Ambulatory Visit (HOSPITAL_BASED_OUTPATIENT_CLINIC_OR_DEPARTMENT_OTHER): Payer: Medicare Other | Admitting: Physical Medicine & Rehabilitation

## 2013-02-03 VITALS — BP 146/56 | HR 76 | Resp 14 | Ht 62.0 in | Wt 115.8 lb

## 2013-02-03 DIAGNOSIS — Z9089 Acquired absence of other organs: Secondary | ICD-10-CM | POA: Insufficient documentation

## 2013-02-03 DIAGNOSIS — R209 Unspecified disturbances of skin sensation: Secondary | ICD-10-CM | POA: Insufficient documentation

## 2013-02-03 DIAGNOSIS — K219 Gastro-esophageal reflux disease without esophagitis: Secondary | ICD-10-CM | POA: Insufficient documentation

## 2013-02-03 DIAGNOSIS — R29898 Other symptoms and signs involving the musculoskeletal system: Secondary | ICD-10-CM | POA: Diagnosis not present

## 2013-02-03 DIAGNOSIS — I69998 Other sequelae following unspecified cerebrovascular disease: Secondary | ICD-10-CM | POA: Diagnosis not present

## 2013-02-03 DIAGNOSIS — E785 Hyperlipidemia, unspecified: Secondary | ICD-10-CM | POA: Insufficient documentation

## 2013-02-03 DIAGNOSIS — I639 Cerebral infarction, unspecified: Secondary | ICD-10-CM

## 2013-02-03 DIAGNOSIS — Z90710 Acquired absence of both cervix and uterus: Secondary | ICD-10-CM | POA: Insufficient documentation

## 2013-02-03 DIAGNOSIS — R42 Dizziness and giddiness: Secondary | ICD-10-CM | POA: Diagnosis not present

## 2013-02-03 DIAGNOSIS — M069 Rheumatoid arthritis, unspecified: Secondary | ICD-10-CM | POA: Diagnosis not present

## 2013-02-03 DIAGNOSIS — Z7902 Long term (current) use of antithrombotics/antiplatelets: Secondary | ICD-10-CM | POA: Diagnosis not present

## 2013-02-03 DIAGNOSIS — E039 Hypothyroidism, unspecified: Secondary | ICD-10-CM | POA: Diagnosis not present

## 2013-02-03 DIAGNOSIS — G8320 Monoplegia of upper limb affecting unspecified side: Secondary | ICD-10-CM

## 2013-02-03 DIAGNOSIS — I6350 Cerebral infarction due to unspecified occlusion or stenosis of unspecified cerebral artery: Secondary | ICD-10-CM

## 2013-02-03 DIAGNOSIS — Z853 Personal history of malignant neoplasm of breast: Secondary | ICD-10-CM | POA: Diagnosis not present

## 2013-02-03 DIAGNOSIS — I672 Cerebral atherosclerosis: Secondary | ICD-10-CM | POA: Insufficient documentation

## 2013-02-03 DIAGNOSIS — I10 Essential (primary) hypertension: Secondary | ICD-10-CM | POA: Diagnosis not present

## 2013-02-03 DIAGNOSIS — Z923 Personal history of irradiation: Secondary | ICD-10-CM | POA: Insufficient documentation

## 2013-02-03 NOTE — Progress Notes (Signed)
Subjective:    Patient ID: Michele Diaz, female    DOB: May 16, 1932, 79 y.o.   MRN: [id number]  HPI HISTORY OF PRESENT ILLNESS: This is a 79 year old female with history  of rheumatoid arthritis, breast cancer with radiation therapy admitted  January 07, 2013 with right-sided weakness and bouts of vertigo. MRI of  the brain showed small acute infarct, left parietal white matter. MRA  of the head with mild stenosis of the parietal branch of the left middle  cerebral artery. Echocardiogram with ejection fraction of 60% without  emboli. Carotid Dopplers with no ICA stenosis. The patient did not  receive tPA. Neurology Service was consulted, placed on Plavix therapy  for stroke prophylaxis as well as subcutaneous Lovenox for DVT  prophylaxis. Antivert added for bouts of dizziness. Urine culture  greater than 100,000 E. coli, placed on Cipro January 08, 2013.  Neuro rehab had 2 sessions, then discharged No falls Saw PCP Left foot numbness Pain Inventory Average Pain 7 Pain Right Now 7 My pain is burning  In the last 24 hours, has pain interfered with the following? General activity 0 Relation with others 0 Enjoyment of life 0 What TIME of day is your pain at its worst? evening and night Sleep (in general) Fair  Pain is worse with: sitting and standing Pain improves with: rest Relief from Meds: no pain med  Mobility walk without assistance how many minutes can you walk? 45 ability to climb steps?  yes do you drive?  yes  Function retired  Neuro/Psych weakness numbness tingling dizziness  Prior Studies Any changes since last visit?  no  Physicians involved in your care Any changes since last visit?  no   Family History  Problem Relation Age of Onset  . Diabetes Mother   . Cancer Brother   . Heart disease Brother   . Cancer Sister   . Heart disease Sister     MI at age 72  . Hypertension Other   . Heart attack Mother     died of MI at age 46   History    Social History  . Marital Status: Widowed    Spouse Name: N/A    Number of Children: 2  . Years of Education: N/A   Occupational History  . retired    Social History Main Topics  . Smoking status: Never Smoker   . Smokeless tobacco: Never Used  . Alcohol Use: No  . Drug Use: No  . Sexually Active: No   Other Topics Concern  . None   Social History Narrative  . None   Past Surgical History  Procedure Laterality Date  . Appendectomy    . Cholecystectomy    . Tonsillectomy    . Abdominal hysterectomy  1988  . Dilation and curettage of uterus    . Breast biopsy Right   . Breast lumpectomy Right    Past Medical History  Diagnosis Date  . Hypothyroid   . Asthma   . Hypertension   . GERD (gastroesophageal reflux disease)   . Depression   . Unspecified vitamin D deficiency   . Allergic rhinitis   . Diverticulitis of colon   . Hepatitis, autoimmune   . Herpes zoster 2011  . Weight loss 6/11  . Hyperlipidemia     statins increased LFTs  . PONV (postoperative nausea and vomiting)     "years ago" (01/07/2013)  . Giant cell arteritis 2003    devashwar  . Temporal arteritis   .  Daily headache     "related to temporal arteritis" (01/07/2013)  . Arthritis     "hips, hands, feet; pretty much feels like all over" (01/07/2013)  . Fibromyalgia   . Chronic lower back pain   . Anxiety   . Breast cancer ~ 2008    "right" (01/07/2013)   BP 146/56  Pulse 76  Resp 14  Ht 5\' 2"  (1.575 m)  Wt 115 lb 12.8 oz (52.527 kg)  BMI 21.17 kg/m2  SpO2 97%    Review of Systems  Neurological: Positive for dizziness, weakness and numbness.       Tingling  All other systems reviewed and are negative.       Objective:   Physical Exam  Nursing note and vitals reviewed. Constitutional: She is oriented to person, place, and time. She appears well-developed and well-nourished.  HENT:  Head: Normocephalic and atraumatic.  Eyes: Conjunctivae are normal.  Neurological: She is  alert and oriented to person, place, and time. She has normal reflexes.  Psychiatric: She has a normal mood and affect.          Assessment & Plan:  1.  Left parietal infarct RUE weakness mild with fine motor deficit, emphasize forced use, expect continued improvement over the next couple months.  No formal therapy needed at this point.

## 2013-02-03 NOTE — Patient Instructions (Signed)
Encourage continued functional use of right arm

## 2013-02-04 ENCOUNTER — Inpatient Hospital Stay: Payer: Medicare Other | Admitting: Physical Medicine & Rehabilitation

## 2013-02-10 ENCOUNTER — Encounter: Payer: Medicare Other | Admitting: Occupational Therapy

## 2013-02-10 ENCOUNTER — Ambulatory Visit: Payer: Medicare Other | Admitting: Physical Therapy

## 2013-02-17 ENCOUNTER — Encounter: Payer: Medicare Other | Admitting: Occupational Therapy

## 2013-02-17 ENCOUNTER — Encounter: Payer: Medicare Other | Admitting: Physical Therapy

## 2013-02-24 ENCOUNTER — Encounter: Payer: Medicare Other | Admitting: Occupational Therapy

## 2013-02-24 ENCOUNTER — Ambulatory Visit: Payer: Medicare Other | Admitting: Physical Therapy

## 2013-03-03 ENCOUNTER — Encounter: Payer: Medicare Other | Admitting: Physical Therapy

## 2013-03-03 ENCOUNTER — Encounter: Payer: Medicare Other | Admitting: Occupational Therapy

## 2013-03-07 ENCOUNTER — Encounter (INDEPENDENT_AMBULATORY_CARE_PROVIDER_SITE_OTHER): Payer: Self-pay | Admitting: General Surgery

## 2013-03-07 ENCOUNTER — Ambulatory Visit (INDEPENDENT_AMBULATORY_CARE_PROVIDER_SITE_OTHER): Payer: Medicare Other | Admitting: General Surgery

## 2013-03-07 VITALS — BP 130/80 | HR 60 | Temp 97.4°F | Resp 14 | Ht 62.0 in | Wt 117.4 lb

## 2013-03-07 DIAGNOSIS — Z853 Personal history of malignant neoplasm of breast: Secondary | ICD-10-CM

## 2013-03-07 NOTE — Patient Instructions (Signed)
Continue regular self exams  

## 2013-03-07 NOTE — Progress Notes (Signed)
Subjective:     Patient ID: Michele Diaz, female   DOB: 03-21-1932, 79 y.o.   MRN: [id number]  HPI The patient is a 79 year old white female who is 7 years status post right lumpectomy and negative sentinel node biopsy for a T1 C. N0 right breast cancer. In May of this year she suffered a stroke and still has some residual weakness of her right side. Her headaches are not as bad as they were last year. She is still set over the passing of her husband. She does have some tenderness of her right breast which is a little bit worse over recent months. Her most recent mammogram was in January that showed no evidence of malignancy  Review of Systems  Eyes: Negative.   Respiratory: Negative.   Cardiovascular: Negative.   Gastrointestinal: Negative.   Endocrine: Negative.   Genitourinary: Negative.   Musculoskeletal: Negative.   Skin: Negative.   Allergic/Immunologic: Negative.   Neurological: Positive for weakness and headaches.  Hematological: Negative.   Psychiatric/Behavioral: Negative.        Objective:   Physical Exam  Constitutional: She is oriented to person, place, and time. She appears well-developed and well-nourished.  HENT:  Head: Normocephalic and atraumatic.  Eyes: Conjunctivae and EOM are normal. Pupils are equal, round, and reactive to light.  Neck: Normal range of motion. Neck supple.  Cardiovascular: Normal rate, regular rhythm and normal heart sounds.   Pulmonary/Chest: Effort normal and breath sounds normal.  She is tender in her right breast especially medially. There is no palpable mass in either breast. There is no palpable axillary or supraclavicular cervical lymphadenopathy  Abdominal: Soft. Bowel sounds are normal. She exhibits no mass. There is no tenderness.  Musculoskeletal: Normal range of motion.  Neurological: She is alert and oriented to person, place, and time.  Skin: Skin is warm and dry.  Psychiatric: She has a normal mood and affect. Her behavior is  normal.       Assessment:     The patient is 7 years status post right lumpectomy for breast cancer     Plan:     At this point she will continue to do regular self exams. We will plan to see her back in about one year.

## 2013-03-21 ENCOUNTER — Other Ambulatory Visit: Payer: Self-pay | Admitting: *Deleted

## 2013-03-21 MED ORDER — CLOPIDOGREL BISULFATE 75 MG PO TABS: 75.0000 mg | ORAL_TABLET | Freq: Every day | ORAL | Status: DC

## 2013-04-09 ENCOUNTER — Other Ambulatory Visit: Payer: Self-pay | Admitting: Internal Medicine

## 2013-04-20 DIAGNOSIS — Z7902 Long term (current) use of antithrombotics/antiplatelets: Secondary | ICD-10-CM | POA: Diagnosis not present

## 2013-04-22 ENCOUNTER — Encounter: Payer: Self-pay | Admitting: Internal Medicine

## 2013-04-22 ENCOUNTER — Other Ambulatory Visit: Payer: Self-pay | Admitting: *Deleted

## 2013-04-22 ENCOUNTER — Ambulatory Visit (INDEPENDENT_AMBULATORY_CARE_PROVIDER_SITE_OTHER): Payer: Medicare Other | Admitting: Internal Medicine

## 2013-04-22 VITALS — BP 130/80 | HR 72 | Temp 98.0°F | Resp 16 | Wt 122.0 lb

## 2013-04-22 DIAGNOSIS — M353 Polymyalgia rheumatica: Secondary | ICD-10-CM

## 2013-04-22 DIAGNOSIS — L570 Actinic keratosis: Secondary | ICD-10-CM | POA: Diagnosis not present

## 2013-04-22 DIAGNOSIS — R5383 Other fatigue: Secondary | ICD-10-CM

## 2013-04-22 DIAGNOSIS — R634 Abnormal weight loss: Secondary | ICD-10-CM

## 2013-04-22 DIAGNOSIS — R5381 Other malaise: Secondary | ICD-10-CM

## 2013-04-22 DIAGNOSIS — I10 Essential (primary) hypertension: Secondary | ICD-10-CM | POA: Diagnosis not present

## 2013-04-22 DIAGNOSIS — F4321 Adjustment disorder with depressed mood: Secondary | ICD-10-CM

## 2013-04-22 DIAGNOSIS — B079 Viral wart, unspecified: Secondary | ICD-10-CM

## 2013-04-22 MED ORDER — PANTOPRAZOLE SODIUM 40 MG PO TBEC: 40.0000 mg | DELAYED_RELEASE_TABLET | Freq: Every day | ORAL | Status: DC

## 2013-04-22 MED ORDER — AZATHIOPRINE 50 MG PO TABS: ORAL_TABLET | ORAL | Status: DC

## 2013-04-22 MED ORDER — CLOPIDOGREL BISULFATE 75 MG PO TABS: 75.0000 mg | ORAL_TABLET | Freq: Every day | ORAL | Status: DC

## 2013-04-22 MED ORDER — MIRTAZAPINE 15 MG PO TABS: 7.5000 mg | ORAL_TABLET | Freq: Every day | ORAL | Status: DC

## 2013-04-22 MED ORDER — LEVOTHYROXINE SODIUM 88 MCG PO TABS: 88.0000 ug | ORAL_TABLET | Freq: Every day | ORAL | Status: DC

## 2013-04-22 MED ORDER — MECLIZINE HCL 12.5 MG PO TABS: 12.5000 mg | ORAL_TABLET | Freq: Two times a day (BID) | ORAL | Status: DC

## 2013-04-22 MED ORDER — DONEPEZIL HCL 5 MG PO TABS: 5.0000 mg | ORAL_TABLET | Freq: Every day | ORAL | Status: DC

## 2013-04-22 MED ORDER — ALPRAZOLAM 0.5 MG PO TABS: 0.2500 mg | ORAL_TABLET | Freq: Three times a day (TID) | ORAL | Status: DC | PRN

## 2013-04-22 NOTE — Assessment & Plan Note (Signed)
L hand x 1 See Cryo

## 2013-04-22 NOTE — Assessment & Plan Note (Signed)
Better  

## 2013-04-22 NOTE — Assessment & Plan Note (Signed)
Continue with current prescription therapy as reflected on the Med list.  

## 2013-04-22 NOTE — Progress Notes (Signed)
Subjective:    HPI  The patient presents for a follow-up of  chronic hypertension, chronic dyslipidemia, memory problems controlled with medicines  Her husband died in 12-14-12grieving Family is worried about her wt loss - better C/o PMR sx's returned 9/13 she moved to a town house   Wt Readings from Last 3 Encounters:  04/22/13 122 lb (55.339 kg)  03/07/13 117 lb 6.4 oz (53.252 kg)  02/03/13 115 lb 12.8 oz (52.527 kg)   BP Readings from Last 3 Encounters:  04/22/13 130/80  03/07/13 130/80  02/03/13 146/56     Review of Systems  Constitutional: Positive for unexpected weight change. Negative for fever, chills, activity change, appetite change and fatigue.  HENT: Negative for congestion, mouth sores and sinus pressure.   Eyes: Negative for visual disturbance.  Respiratory: Negative for cough and chest tightness.   Gastrointestinal: Negative for nausea and abdominal pain.  Genitourinary: Negative for frequency, difficulty urinating and vaginal pain.  Musculoskeletal: Negative for back pain and gait problem.  Skin: Negative for pallor and rash.  Neurological: Negative for dizziness, tremors, weakness, numbness and headaches.  Psychiatric/Behavioral: Positive for sleep disturbance. Negative for suicidal ideas and confusion. The patient is nervous/anxious.    L hand wart vs AK    Objective:   Physical Exam  Constitutional: She appears well-developed. No distress.  thin  HENT:  Head: Normocephalic.  Right Ear: External ear normal.  Left Ear: External ear normal.  Nose: Nose normal.  Mouth/Throat: Oropharynx is clear and moist.  Eyes: Conjunctivae are normal. Pupils are equal, round, and reactive to light. Right eye exhibits no discharge. Left eye exhibits no discharge.  Neck: Normal range of motion. Neck supple. No JVD present. No tracheal deviation present. No thyromegaly present.  Cardiovascular: Normal rate, regular rhythm and normal heart sounds.    Pulmonary/Chest: No stridor. No respiratory distress. She has no wheezes.  Abdominal: Soft. Bowel sounds are normal. She exhibits no distension and no mass. There is no tenderness. There is no rebound and no guarding.  Musculoskeletal: She exhibits no edema and no tenderness.  Lymphadenopathy:    She has no cervical adenopathy.  Neurological: She displays normal reflexes. No cranial nerve deficit. She exhibits normal muscle tone. Coordination normal.  Skin: No rash noted. No erythema.  Psychiatric: Her behavior is normal. Judgment and thought content normal.  sad   Lab Results  Component Value Date   WBC 5.8 01/11/2013   HGB 12.3 01/11/2013   HCT 35.5* 01/11/2013   PLT 124* 01/11/2013   GLUCOSE 79 01/11/2013   CHOL 178 01/08/2013   TRIG 226* 01/08/2013   HDL 58 01/08/2013   LDLDIRECT 125.9 10/06/2007   LDLCALC 75 01/08/2013   ALT 8 01/11/2013   AST 21 01/11/2013   NA 137 01/11/2013   K 3.7 01/11/2013   CL 101 01/11/2013   CREATININE 0.64 01/11/2013   BUN 13 01/11/2013   CO2 26 01/11/2013   TSH 0.824 01/07/2013   INR 1.01 01/07/2013   HGBA1C 5.3 01/08/2013    Procedure Note :     Procedure : Cryosurgery   Indication:  Wart(s)  x1 L hand   Risks including unsuccessful procedure , bleeding, infection, bruising, scar, a need for a repeat  procedure and others were explained to the patient in detail as well as the benefits. Informed consent was obtained verbally.    1 lesion(s)  on  L hand  was/were treated with liquid nitrogen on a Q-tip in a  usual fasion . Band-Aid was applied and antibiotic ointment was given for a later use.   Tolerated well. Complications none.   Postprocedure instructions :     Keep the wounds clean. You can wash them with liquid soap and water. Pat dry with gauze or a Kleenex tissue  Before applying antibiotic ointment and a Band-Aid.   You need to report immediately  if  any signs of infection develop.          Assessment & Plan:

## 2013-04-22 NOTE — Assessment & Plan Note (Signed)
Wt Readings from Last 3 Encounters:  04/22/13 122 lb (55.339 kg)  03/07/13 117 lb 6.4 oz (53.252 kg)  02/03/13 115 lb 12.8 oz (52.527 kg)  better

## 2013-04-22 NOTE — Patient Instructions (Addendum)
   Postprocedure instructions :     Keep the wounds clean. You can wash them with liquid soap and water. Pat dry with gauze or a Kleenex tissue  Before applying antibiotic ointment and a Band-Aid.   You need to report immediately  if  any signs of infection develop.    

## 2013-04-24 DIAGNOSIS — B079 Viral wart, unspecified: Secondary | ICD-10-CM | POA: Insufficient documentation

## 2013-04-24 NOTE — Assessment & Plan Note (Signed)
L hand x1

## 2013-05-19 ENCOUNTER — Telehealth: Payer: Self-pay | Admitting: *Deleted

## 2013-05-19 NOTE — Telephone Encounter (Signed)
Pantoprazole 40 mg PA is approved from 03/18/13 until 05/19/2014. Pharmacy informed.

## 2013-08-19 ENCOUNTER — Telehealth: Payer: Self-pay | Admitting: *Deleted

## 2013-08-19 NOTE — Telephone Encounter (Signed)
Pt called states she woke up this morning with a nose bleed.  States this is about the 5th time since beginning prescribed Plavix 75mg  that she has had a nose bleed.  Please advise

## 2013-08-21 NOTE — Telephone Encounter (Signed)
OV next wk pls Thx 

## 2013-08-22 ENCOUNTER — Ambulatory Visit (INDEPENDENT_AMBULATORY_CARE_PROVIDER_SITE_OTHER): Payer: Medicare Other | Admitting: Internal Medicine

## 2013-08-22 ENCOUNTER — Other Ambulatory Visit (INDEPENDENT_AMBULATORY_CARE_PROVIDER_SITE_OTHER): Payer: Medicare Other

## 2013-08-22 ENCOUNTER — Encounter: Payer: Self-pay | Admitting: Internal Medicine

## 2013-08-22 VITALS — BP 138/90 | HR 80 | Temp 97.8°F | Resp 16 | Wt 128.0 lb

## 2013-08-22 DIAGNOSIS — E039 Hypothyroidism, unspecified: Secondary | ICD-10-CM

## 2013-08-22 DIAGNOSIS — R634 Abnormal weight loss: Secondary | ICD-10-CM

## 2013-08-22 DIAGNOSIS — I639 Cerebral infarction, unspecified: Secondary | ICD-10-CM

## 2013-08-22 DIAGNOSIS — M353 Polymyalgia rheumatica: Secondary | ICD-10-CM

## 2013-08-22 DIAGNOSIS — R040 Epistaxis: Secondary | ICD-10-CM | POA: Diagnosis not present

## 2013-08-22 DIAGNOSIS — I10 Essential (primary) hypertension: Secondary | ICD-10-CM

## 2013-08-22 DIAGNOSIS — Z23 Encounter for immunization: Secondary | ICD-10-CM | POA: Diagnosis not present

## 2013-08-22 DIAGNOSIS — I6350 Cerebral infarction due to unspecified occlusion or stenosis of unspecified cerebral artery: Secondary | ICD-10-CM | POA: Diagnosis not present

## 2013-08-22 DIAGNOSIS — F4321 Adjustment disorder with depressed mood: Secondary | ICD-10-CM

## 2013-08-22 LAB — CBC WITH DIFFERENTIAL/PLATELET
Basophils Relative: 0.3 % (ref 0.0–3.0)
Eosinophils Absolute: 0.2 10*3/uL (ref 0.0–0.7)
Eosinophils Relative: 2.7 % (ref 0.0–5.0)
Hemoglobin: 13.1 g/dL (ref 12.0–15.0)
Lymphocytes Relative: 11 % — ABNORMAL LOW (ref 12.0–46.0)
MCHC: 34.7 g/dL (ref 30.0–36.0)
Monocytes Relative: 9.5 % (ref 3.0–12.0)
Neutro Abs: 4.8 10*3/uL (ref 1.4–7.7)
RBC: 4.25 Mil/uL (ref 3.87–5.11)
WBC: 6.3 10*3/uL (ref 4.5–10.5)

## 2013-08-22 LAB — SEDIMENTATION RATE: Sed Rate: 41 mm/hr — ABNORMAL HIGH (ref 0–22)

## 2013-08-22 LAB — TSH: TSH: 0.97 u[IU]/mL (ref 0.35–5.50)

## 2013-08-22 LAB — T4, FREE: Free T4: 1.18 ng/dL (ref 0.60–1.60)

## 2013-08-22 NOTE — Assessment & Plan Note (Signed)
Doing well on Predn 5 mg/d ESR RTC 3 mo

## 2013-08-22 NOTE — Assessment & Plan Note (Signed)
  2014 R side ENT ref

## 2013-08-22 NOTE — Progress Notes (Signed)
   Subjective:    HPI She had a case of a telephone harassment - stressed She had a case of a nose bleeding x4-5 times (R side).  The patient presents for a follow-up of  chronic hypertension, chronic dyslipidemia, memory problems controlled with medicines  Her husband died in January 07, 2013grieving Family is worried about her wt loss - better F/u PMR sx's returned 9/13 she moved to a town house   Wt Readings from Last 3 Encounters:  08/22/13 128 lb (58.06 kg)  04/22/13 122 lb (55.339 kg)  03/07/13 117 lb 6.4 oz (53.252 kg)   BP Readings from Last 3 Encounters:  08/22/13 138/90  04/22/13 130/80  03/07/13 130/80     Review of Systems  Constitutional: Positive for unexpected weight change. Negative for fever, chills, activity change, appetite change and fatigue.  HENT: Negative for congestion, mouth sores and sinus pressure.   Eyes: Negative for visual disturbance.  Respiratory: Negative for cough and chest tightness.   Gastrointestinal: Negative for nausea and abdominal pain.  Genitourinary: Negative for frequency, difficulty urinating and vaginal pain.  Musculoskeletal: Negative for back pain and gait problem.  Skin: Negative for pallor and rash.  Neurological: Negative for dizziness, tremors, weakness, numbness and headaches.  Psychiatric/Behavioral: Positive for sleep disturbance. Negative for suicidal ideas and confusion. The patient is nervous/anxious.       Objective:   Physical Exam  Constitutional: She appears well-developed. No distress.  thin  HENT:  Head: Normocephalic.  Right Ear: External ear normal.  Left Ear: External ear normal.  Nose: Nose normal.  Mouth/Throat: Oropharynx is clear and moist.  Eyes: Conjunctivae are normal. Pupils are equal, round, and reactive to light. Right eye exhibits no discharge. Left eye exhibits no discharge.  Neck: Normal range of motion. Neck supple. No JVD present. No tracheal deviation present. No thyromegaly present.   Cardiovascular: Normal rate, regular rhythm and normal heart sounds.   Pulmonary/Chest: No stridor. No respiratory distress. She has no wheezes.  Abdominal: Soft. Bowel sounds are normal. She exhibits no distension and no mass. There is no tenderness. There is no rebound and no guarding.  Musculoskeletal: She exhibits no edema and no tenderness.  Lymphadenopathy:    She has no cervical adenopathy.  Neurological: She displays normal reflexes. No cranial nerve deficit. She exhibits normal muscle tone. Coordination normal.  Skin: No rash noted. No erythema.  Psychiatric: Her behavior is normal. Judgment and thought content normal.  sad   Lab Results  Component Value Date   WBC 5.8 01/11/2013   HGB 12.3 01/11/2013   HCT 35.5* 01/11/2013   PLT 124* 01/11/2013   GLUCOSE 79 01/11/2013   CHOL 178 01/08/2013   TRIG 226* 01/08/2013   HDL 58 01/08/2013   LDLDIRECT 125.9 10/06/2007   LDLCALC 75 01/08/2013   ALT 8 01/11/2013   AST 21 01/11/2013   NA 137 01/11/2013   K 3.7 01/11/2013   CL 101 01/11/2013   CREATININE 0.64 01/11/2013   BUN 13 01/11/2013   CO2 26 01/11/2013   TSH 0.824 01/07/2013   INR 1.01 01/07/2013   HGBA1C 5.3 01/08/2013        Assessment & Plan:

## 2013-08-22 NOTE — Progress Notes (Signed)
Pre visit review using our clinic review tool, if applicable. No additional management support is needed unless otherwise documented below in the visit note. 

## 2013-08-22 NOTE — Assessment & Plan Note (Signed)
On Plavix 

## 2013-08-22 NOTE — Assessment & Plan Note (Signed)
Better   She had a case of a telephone harassment - stressed - better now

## 2013-08-22 NOTE — Assessment & Plan Note (Signed)
Wt Readings from Last 3 Encounters:  08/22/13 128 lb (58.06 kg)  04/22/13 122 lb (55.339 kg)  03/07/13 117 lb 6.4 oz (53.252 kg)  resolved

## 2013-08-22 NOTE — Telephone Encounter (Signed)
Unable to contact pt, phone number has been changed to a non published number.

## 2013-08-22 NOTE — Assessment & Plan Note (Signed)
Continue with current prescription therapy as reflected on the Med list.  

## 2013-10-31 ENCOUNTER — Telehealth: Payer: Self-pay | Admitting: Internal Medicine

## 2013-10-31 NOTE — Telephone Encounter (Signed)
Patient states that yesterday, 10/30/13 she recorded two blood pressure readings which were 174 over 97 and 177 over 112. She wants to know if she should increase her BP medication dosage. Please advise.

## 2013-11-03 NOTE — Telephone Encounter (Signed)
What is she taking for BP? Thx

## 2013-11-04 NOTE — Telephone Encounter (Signed)
She has not been taking any BP meds since hospital stay. She had some left over Carvedilol and took 1/2 bid since 10/31/13. She has not had any Carvedilol yesterday or today. She denies any pain, dizziness or other symptoms. Please advise.

## 2013-11-09 NOTE — Telephone Encounter (Signed)
Pls restart Coreg 1/2 tab bid THx

## 2013-11-11 NOTE — Telephone Encounter (Signed)
Left detailed mess informing pt of below.  

## 2013-11-21 ENCOUNTER — Encounter: Payer: Self-pay | Admitting: Internal Medicine

## 2013-11-21 ENCOUNTER — Ambulatory Visit (INDEPENDENT_AMBULATORY_CARE_PROVIDER_SITE_OTHER): Payer: Medicare Other | Admitting: Internal Medicine

## 2013-11-21 VITALS — BP 175/92 | HR 92 | Temp 98.5°F | Resp 16 | Wt 128.0 lb

## 2013-11-21 DIAGNOSIS — I10 Essential (primary) hypertension: Secondary | ICD-10-CM | POA: Diagnosis not present

## 2013-11-21 DIAGNOSIS — I6350 Cerebral infarction due to unspecified occlusion or stenosis of unspecified cerebral artery: Secondary | ICD-10-CM | POA: Diagnosis not present

## 2013-11-21 DIAGNOSIS — I639 Cerebral infarction, unspecified: Secondary | ICD-10-CM

## 2013-11-21 DIAGNOSIS — R634 Abnormal weight loss: Secondary | ICD-10-CM | POA: Diagnosis not present

## 2013-11-21 DIAGNOSIS — E039 Hypothyroidism, unspecified: Secondary | ICD-10-CM

## 2013-11-21 MED ORDER — LOSARTAN POTASSIUM-HCTZ 100-12.5 MG PO TABS: 1.0000 | ORAL_TABLET | Freq: Every day | ORAL | Status: DC

## 2013-11-21 NOTE — Assessment & Plan Note (Signed)
No relapse Continue with current prescription therapy as reflected on the Med list.  

## 2013-11-21 NOTE — Progress Notes (Signed)
   Subjective:    HPI  She had a case of a telephone harassment - stopped She had a case of a nose bleeding x4-5 times -stopped  The patient presents for a follow-up of  chronic hypertension, chronic dyslipidemia, memory problems controlled with medicines  Her husband died in Sep 02, 2011, grieving  Family is worried about her wt loss - better F/u PMR sx's returned 9/13 she moved to a town house   Wt Readings from Last 3 Encounters:  11/21/13 128 lb (58.06 kg)  08/22/13 128 lb (58.06 kg)  04/22/13 122 lb (55.339 kg)   BP Readings from Last 3 Encounters:  11/21/13 175/92  08/22/13 138/90  04/22/13 130/80     Review of Systems  Constitutional: Positive for unexpected weight change. Negative for fever, chills, activity change, appetite change and fatigue.  HENT: Negative for congestion, mouth sores and sinus pressure.   Eyes: Negative for visual disturbance.  Respiratory: Negative for cough and chest tightness.   Gastrointestinal: Negative for nausea and abdominal pain.  Genitourinary: Negative for frequency, difficulty urinating and vaginal pain.  Musculoskeletal: Negative for back pain and gait problem.  Skin: Negative for pallor and rash.  Neurological: Negative for dizziness, tremors, weakness, numbness and headaches.  Psychiatric/Behavioral: Positive for sleep disturbance. Negative for suicidal ideas and confusion. The patient is nervous/anxious.       Objective:   Physical Exam  Constitutional: She appears well-developed. No distress.  thin  HENT:  Head: Normocephalic.  Right Ear: External ear normal.  Left Ear: External ear normal.  Nose: Nose normal.  Mouth/Throat: Oropharynx is clear and moist.  Eyes: Conjunctivae are normal. Pupils are equal, round, and reactive to light. Right eye exhibits no discharge. Left eye exhibits no discharge.  Neck: Normal range of motion. Neck supple. No JVD present. No tracheal deviation present. No thyromegaly present.   Cardiovascular: Normal rate, regular rhythm and normal heart sounds.   Pulmonary/Chest: No stridor. No respiratory distress. She has no wheezes.  Abdominal: Soft. Bowel sounds are normal. She exhibits no distension and no mass. There is no tenderness. There is no rebound and no guarding.  Musculoskeletal: She exhibits no edema and no tenderness.  Lymphadenopathy:    She has no cervical adenopathy.  Neurological: She displays normal reflexes. No cranial nerve deficit. She exhibits normal muscle tone. Coordination normal.  Skin: No rash noted. No erythema.  Psychiatric: Her behavior is normal. Judgment and thought content normal.  sad   Lab Results  Component Value Date   WBC 6.3 08/22/2013   HGB 13.1 08/22/2013   HCT 37.7 08/22/2013   PLT 176.0 08/22/2013   GLUCOSE 79 01/11/2013   CHOL 178 01/08/2013   TRIG 226* 01/08/2013   HDL 58 01/08/2013   LDLDIRECT 125.9 10/06/2007   LDLCALC 75 01/08/2013   ALT 8 01/11/2013   AST 21 01/11/2013   NA 137 01/11/2013   K 3.7 01/11/2013   CL 101 01/11/2013   CREATININE 0.64 01/11/2013   BUN 13 01/11/2013   CO2 26 01/11/2013   TSH 0.97 08/22/2013   INR 1.2* 08/22/2013   HGBA1C 5.3 01/08/2013        Assessment & Plan:

## 2013-11-21 NOTE — Progress Notes (Signed)
Pre visit review using our clinic review tool, if applicable. No additional management support is needed unless otherwise documented below in the visit note. 

## 2013-11-21 NOTE — Assessment & Plan Note (Addendum)
Start Hyzaar 100-12.5 qd NAS diet

## 2013-11-21 NOTE — Assessment & Plan Note (Signed)
Resolved Wt Readings from Last 3 Encounters:  11/21/13 128 lb (58.06 kg)  08/22/13 128 lb (58.06 kg)  04/22/13 122 lb (55.339 kg)

## 2013-11-21 NOTE — Assessment & Plan Note (Signed)
Continue with current prescription therapy as reflected on the Med list.  

## 2013-11-29 DIAGNOSIS — Z853 Personal history of malignant neoplasm of breast: Secondary | ICD-10-CM | POA: Diagnosis not present

## 2013-12-19 ENCOUNTER — Ambulatory Visit (INDEPENDENT_AMBULATORY_CARE_PROVIDER_SITE_OTHER): Payer: Medicare Other | Admitting: Internal Medicine

## 2013-12-19 ENCOUNTER — Encounter: Payer: Self-pay | Admitting: Internal Medicine

## 2013-12-19 VITALS — BP 138/94 | HR 80 | Temp 97.1°F | Resp 16 | Wt 129.0 lb

## 2013-12-19 DIAGNOSIS — E039 Hypothyroidism, unspecified: Secondary | ICD-10-CM

## 2013-12-19 DIAGNOSIS — M353 Polymyalgia rheumatica: Secondary | ICD-10-CM | POA: Diagnosis not present

## 2013-12-19 DIAGNOSIS — I6350 Cerebral infarction due to unspecified occlusion or stenosis of unspecified cerebral artery: Secondary | ICD-10-CM | POA: Diagnosis not present

## 2013-12-19 DIAGNOSIS — R634 Abnormal weight loss: Secondary | ICD-10-CM | POA: Diagnosis not present

## 2013-12-19 DIAGNOSIS — I10 Essential (primary) hypertension: Secondary | ICD-10-CM | POA: Diagnosis not present

## 2013-12-19 MED ORDER — LEVOTHYROXINE SODIUM 88 MCG PO TABS: 88.0000 ug | ORAL_TABLET | Freq: Every day | ORAL | Status: DC

## 2013-12-19 NOTE — Assessment & Plan Note (Signed)
Continue with current prescription therapy as reflected on the Med list.  

## 2013-12-19 NOTE — Progress Notes (Signed)
Pre visit review using our clinic review tool, if applicable. No additional management support is needed unless otherwise documented below in the visit note. 

## 2013-12-19 NOTE — Assessment & Plan Note (Signed)
Much better Continue with current prescription therapy as reflected on the Med list.  

## 2013-12-19 NOTE — Assessment & Plan Note (Signed)
Wt Readings from Last 3 Encounters:  12/19/13 129 lb (58.514 kg)  11/21/13 128 lb (58.06 kg)  08/22/13 128 lb (58.06 kg)

## 2013-12-19 NOTE — Assessment & Plan Note (Signed)
Doing well on Rx.

## 2013-12-19 NOTE — Progress Notes (Signed)
   Subjective:    HPI  She had a case of a telephone harassment - stopped She had a case of a nose bleeding x4-5 times -stopped  The patient presents for a follow-up of  chronic hypertension, chronic dyslipidemia, memory problems controlled with medicines  Her husband died in 2011-09-08, grieving  Family is worried about her wt loss - better F/u PMR sx's returned 9/13 she moved to a town house   Wt Readings from Last 3 Encounters:  12/19/13 129 lb (58.514 kg)  11/21/13 128 lb (58.06 kg)  08/22/13 128 lb (58.06 kg)   BP Readings from Last 3 Encounters:  12/19/13 138/94  11/21/13 175/92  08/22/13 138/90     Review of Systems  Constitutional: Positive for unexpected weight change. Negative for fever, chills, activity change, appetite change and fatigue.  HENT: Negative for congestion, mouth sores and sinus pressure.   Eyes: Negative for visual disturbance.  Respiratory: Negative for cough and chest tightness.   Gastrointestinal: Negative for nausea and abdominal pain.  Genitourinary: Negative for frequency, difficulty urinating and vaginal pain.  Musculoskeletal: Negative for back pain and gait problem.  Skin: Negative for pallor and rash.  Neurological: Negative for dizziness, tremors, weakness, numbness and headaches.  Psychiatric/Behavioral: Positive for sleep disturbance. Negative for suicidal ideas and confusion. The patient is nervous/anxious.       Objective:   Physical Exam  Constitutional: She appears well-developed. No distress.  thin  HENT:  Head: Normocephalic.  Right Ear: External ear normal.  Left Ear: External ear normal.  Nose: Nose normal.  Mouth/Throat: Oropharynx is clear and moist.  Eyes: Conjunctivae are normal. Pupils are equal, round, and reactive to light. Right eye exhibits no discharge. Left eye exhibits no discharge.  Neck: Normal range of motion. Neck supple. No JVD present. No tracheal deviation present. No thyromegaly present.   Cardiovascular: Normal rate, regular rhythm and normal heart sounds.   Pulmonary/Chest: No stridor. No respiratory distress. She has no wheezes.  Abdominal: Soft. Bowel sounds are normal. She exhibits no distension and no mass. There is no tenderness. There is no rebound and no guarding.  Musculoskeletal: She exhibits no edema and no tenderness.  Lymphadenopathy:    She has no cervical adenopathy.  Neurological: She displays normal reflexes. No cranial nerve deficit. She exhibits normal muscle tone. Coordination normal.  Skin: No rash noted. No erythema.  Psychiatric: Her behavior is normal. Judgment and thought content normal.  sad   Lab Results  Component Value Date   WBC 6.3 08/22/2013   HGB 13.1 08/22/2013   HCT 37.7 08/22/2013   PLT 176.0 08/22/2013   GLUCOSE 79 01/11/2013   CHOL 178 01/08/2013   TRIG 226* 01/08/2013   HDL 58 01/08/2013   LDLDIRECT 125.9 10/06/2007   LDLCALC 75 01/08/2013   ALT 8 01/11/2013   AST 21 01/11/2013   NA 137 01/11/2013   K 3.7 01/11/2013   CL 101 01/11/2013   CREATININE 0.64 01/11/2013   BUN 13 01/11/2013   CO2 26 01/11/2013   TSH 0.97 08/22/2013   INR 1.2* 08/22/2013   HGBA1C 5.3 01/08/2013        Assessment & Plan:

## 2013-12-20 ENCOUNTER — Telehealth: Payer: Self-pay | Admitting: Internal Medicine

## 2013-12-20 NOTE — Telephone Encounter (Signed)
Relevant patient education mailed to patient.  

## 2013-12-27 ENCOUNTER — Encounter (INDEPENDENT_AMBULATORY_CARE_PROVIDER_SITE_OTHER): Payer: Self-pay

## 2014-02-24 ENCOUNTER — Telehealth: Payer: Self-pay | Admitting: *Deleted

## 2014-02-24 NOTE — Telephone Encounter (Signed)
OK to fill this prescription with additional refills x3 Thank you!  

## 2014-02-24 NOTE — Telephone Encounter (Signed)
Rf req for Alprazolam 0.5 mg 1/2-1 po tid prn sleep or anxiety. Ok to Rf?

## 2014-02-27 MED ORDER — ALPRAZOLAM 0.5 MG PO TABS: 0.2500 mg | ORAL_TABLET | Freq: Three times a day (TID) | ORAL | Status: DC | PRN

## 2014-02-27 NOTE — Telephone Encounter (Signed)
Done

## 2014-03-02 ENCOUNTER — Encounter (INDEPENDENT_AMBULATORY_CARE_PROVIDER_SITE_OTHER): Payer: Self-pay | Admitting: General Surgery

## 2014-03-20 ENCOUNTER — Encounter: Payer: Self-pay | Admitting: Internal Medicine

## 2014-03-20 ENCOUNTER — Ambulatory Visit (INDEPENDENT_AMBULATORY_CARE_PROVIDER_SITE_OTHER): Payer: Medicare Other | Admitting: Internal Medicine

## 2014-03-20 VITALS — BP 120/84 | HR 76 | Temp 97.4°F | Resp 16 | Wt 123.0 lb

## 2014-03-20 DIAGNOSIS — R634 Abnormal weight loss: Secondary | ICD-10-CM

## 2014-03-20 DIAGNOSIS — H269 Unspecified cataract: Secondary | ICD-10-CM | POA: Insufficient documentation

## 2014-03-20 DIAGNOSIS — M353 Polymyalgia rheumatica: Secondary | ICD-10-CM | POA: Diagnosis not present

## 2014-03-20 DIAGNOSIS — F341 Dysthymic disorder: Secondary | ICD-10-CM | POA: Diagnosis not present

## 2014-03-20 DIAGNOSIS — I6350 Cerebral infarction due to unspecified occlusion or stenosis of unspecified cerebral artery: Secondary | ICD-10-CM

## 2014-03-20 DIAGNOSIS — E559 Vitamin D deficiency, unspecified: Secondary | ICD-10-CM

## 2014-03-20 DIAGNOSIS — I10 Essential (primary) hypertension: Secondary | ICD-10-CM

## 2014-03-20 DIAGNOSIS — Z23 Encounter for immunization: Secondary | ICD-10-CM

## 2014-03-20 DIAGNOSIS — E039 Hypothyroidism, unspecified: Secondary | ICD-10-CM

## 2014-03-20 MED ORDER — PREDNISONE 5 MG PO TABS: 5.0000 mg | ORAL_TABLET | Freq: Every day | ORAL | Status: DC

## 2014-03-20 NOTE — Assessment & Plan Note (Addendum)
Re-start Prednisone therapy as reflected on the Med list.

## 2014-03-20 NOTE — Assessment & Plan Note (Signed)
Continue with current prescription therapy as reflected on the Med list.  

## 2014-03-20 NOTE — Progress Notes (Signed)
   Subjective:    HPI    The patient presents for a follow-up of  chronic hypertension, chronic dyslipidemia, memory problems controlled with medicines  Her husband died in 2011-09-10  Family is worried about her wt loss - better F/u PMR sx's returned 9/13 she moved to a town house   Wt Readings from Last 3 Encounters:  03/20/14 123 lb (55.792 kg)  12/19/13 129 lb (58.514 kg)  11/21/13 128 lb (58.06 kg)   BP Readings from Last 3 Encounters:  03/20/14 120/84  12/19/13 138/94  11/21/13 175/92     Review of Systems  Constitutional: Positive for unexpected weight change. Negative for fever, chills, activity change, appetite change and fatigue.  HENT: Negative for congestion, mouth sores and sinus pressure.   Eyes: Negative for visual disturbance.  Respiratory: Negative for cough and chest tightness.   Gastrointestinal: Negative for nausea and abdominal pain.  Genitourinary: Negative for frequency, difficulty urinating and vaginal pain.  Musculoskeletal: Negative for back pain and gait problem.  Skin: Negative for pallor and rash.  Neurological: Negative for dizziness, tremors, weakness, numbness and headaches.  Psychiatric/Behavioral: Positive for sleep disturbance. Negative for suicidal ideas and confusion. The patient is nervous/anxious.       Objective:   Physical Exam  Constitutional: She appears well-developed. No distress.  thin  HENT:  Head: Normocephalic.  Right Ear: External ear normal.  Left Ear: External ear normal.  Nose: Nose normal.  Mouth/Throat: Oropharynx is clear and moist.  Eyes: Conjunctivae are normal. Pupils are equal, round, and reactive to light. Right eye exhibits no discharge. Left eye exhibits no discharge.  Neck: Normal range of motion. Neck supple. No JVD present. No tracheal deviation present. No thyromegaly present.  Cardiovascular: Normal rate, regular rhythm and normal heart sounds.   Pulmonary/Chest: No stridor. No respiratory  distress. She has no wheezes.  Abdominal: Soft. Bowel sounds are normal. She exhibits no distension and no mass. There is no tenderness. There is no rebound and no guarding.  Musculoskeletal: She exhibits no edema and no tenderness.  Lymphadenopathy:    She has no cervical adenopathy.  Neurological: She displays normal reflexes. No cranial nerve deficit. She exhibits normal muscle tone. Coordination normal.  Skin: No rash noted. No erythema.  Psychiatric: Her behavior is normal. Judgment and thought content normal.  sad   Lab Results  Component Value Date   WBC 6.3 08/22/2013   HGB 13.1 08/22/2013   HCT 37.7 08/22/2013   PLT 176.0 08/22/2013   GLUCOSE 79 01/11/2013   CHOL 178 01/08/2013   TRIG 226* 01/08/2013   HDL 58 01/08/2013   LDLDIRECT 125.9 10/06/2007   LDLCALC 75 01/08/2013   ALT 8 01/11/2013   AST 21 01/11/2013   NA 137 01/11/2013   K 3.7 01/11/2013   CL 101 01/11/2013   CREATININE 0.64 01/11/2013   BUN 13 01/11/2013   CO2 26 01/11/2013   TSH 0.97 08/22/2013   INR 1.2* 08/22/2013   HGBA1C 5.3 01/08/2013        Assessment & Plan:

## 2014-03-20 NOTE — Assessment & Plan Note (Signed)
?  mild Ophth ref Dr Antionette Fairy

## 2014-03-20 NOTE — Assessment & Plan Note (Signed)
Wt Readings from Last 3 Encounters:  03/20/14 123 lb (55.792 kg)  12/19/13 129 lb (58.514 kg)  11/21/13 128 lb (58.06 kg)

## 2014-03-20 NOTE — Progress Notes (Signed)
Pre visit review using our clinic review tool, if applicable. No additional management support is needed unless otherwise documented below in the visit note. 

## 2014-04-10 DIAGNOSIS — H25049 Posterior subcapsular polar age-related cataract, unspecified eye: Secondary | ICD-10-CM | POA: Diagnosis not present

## 2014-04-10 DIAGNOSIS — H25019 Cortical age-related cataract, unspecified eye: Secondary | ICD-10-CM | POA: Diagnosis not present

## 2014-04-10 DIAGNOSIS — H2510 Age-related nuclear cataract, unspecified eye: Secondary | ICD-10-CM | POA: Diagnosis not present

## 2014-04-10 DIAGNOSIS — H35369 Drusen (degenerative) of macula, unspecified eye: Secondary | ICD-10-CM | POA: Diagnosis not present

## 2014-04-10 DIAGNOSIS — H43819 Vitreous degeneration, unspecified eye: Secondary | ICD-10-CM | POA: Diagnosis not present

## 2014-05-26 ENCOUNTER — Telehealth: Payer: Self-pay | Admitting: *Deleted

## 2014-05-26 MED ORDER — MIRTAZAPINE 15 MG PO TABS: 7.5000 mg | ORAL_TABLET | Freq: Every day | ORAL | Status: DC

## 2014-05-26 NOTE — Telephone Encounter (Signed)
#  90 1 ref Thx

## 2014-05-26 NOTE — Telephone Encounter (Signed)
Done

## 2014-05-26 NOTE — Telephone Encounter (Signed)
Rf req for Mirtazapine 15 mg 1/2 po qhs. # 90. Ok to Rf?

## 2014-06-21 ENCOUNTER — Other Ambulatory Visit: Payer: Self-pay | Admitting: Internal Medicine

## 2014-06-23 ENCOUNTER — Other Ambulatory Visit: Payer: Self-pay | Admitting: Internal Medicine

## 2014-07-08 ENCOUNTER — Other Ambulatory Visit: Payer: Self-pay | Admitting: Internal Medicine

## 2014-07-28 ENCOUNTER — Ambulatory Visit: Payer: Medicare Other | Admitting: Internal Medicine

## 2014-08-01 ENCOUNTER — Ambulatory Visit (INDEPENDENT_AMBULATORY_CARE_PROVIDER_SITE_OTHER): Payer: Medicare Other | Admitting: Internal Medicine

## 2014-08-01 ENCOUNTER — Encounter: Payer: Self-pay | Admitting: Internal Medicine

## 2014-08-01 VITALS — BP 140/85 | HR 73 | Temp 97.7°F | Wt 129.0 lb

## 2014-08-01 DIAGNOSIS — M353 Polymyalgia rheumatica: Secondary | ICD-10-CM | POA: Diagnosis not present

## 2014-08-01 DIAGNOSIS — F341 Dysthymic disorder: Secondary | ICD-10-CM | POA: Diagnosis not present

## 2014-08-01 DIAGNOSIS — I10 Essential (primary) hypertension: Secondary | ICD-10-CM | POA: Diagnosis not present

## 2014-08-01 DIAGNOSIS — E038 Other specified hypothyroidism: Secondary | ICD-10-CM | POA: Diagnosis not present

## 2014-08-01 DIAGNOSIS — E034 Atrophy of thyroid (acquired): Secondary | ICD-10-CM

## 2014-08-01 DIAGNOSIS — I639 Cerebral infarction, unspecified: Secondary | ICD-10-CM

## 2014-08-01 DIAGNOSIS — Z23 Encounter for immunization: Secondary | ICD-10-CM | POA: Diagnosis not present

## 2014-08-01 MED ORDER — ESCITALOPRAM OXALATE 10 MG PO TABS: 10.0000 mg | ORAL_TABLET | Freq: Every day | ORAL | Status: DC

## 2014-08-01 NOTE — Progress Notes (Signed)
   Subjective:    HPI    The patient presents for a follow-up of  chronic hypertension, chronic dyslipidemia, memory problems controlled with medicines  Her husband died in Sep 07, 2011  Family is worried about her wt loss - better F/u PMR sx's returned 9/13 she moved to a town house   Wt Readings from Last 3 Encounters:  08/01/14 129 lb (58.514 kg)  03/20/14 123 lb (55.792 kg)  12/19/13 129 lb (58.514 kg)   BP Readings from Last 3 Encounters:  08/01/14 140/85  03/20/14 120/84  12/19/13 138/94     Review of Systems  Constitutional: Positive for unexpected weight change. Negative for fever, chills, activity change, appetite change and fatigue.  HENT: Negative for congestion, mouth sores and sinus pressure.   Eyes: Negative for visual disturbance.  Respiratory: Negative for cough and chest tightness.   Gastrointestinal: Negative for nausea and abdominal pain.  Genitourinary: Negative for frequency, difficulty urinating and vaginal pain.  Musculoskeletal: Negative for back pain and gait problem.  Skin: Negative for pallor and rash.  Neurological: Negative for dizziness, tremors, weakness, numbness and headaches.  Psychiatric/Behavioral: Positive for sleep disturbance. Negative for suicidal ideas and confusion. The patient is nervous/anxious.       Objective:   Physical Exam Lab Results  Component Value Date   WBC 6.3 08/22/2013   HGB 13.1 08/22/2013   HCT 37.7 08/22/2013   PLT 176.0 08/22/2013   GLUCOSE 79 01/11/2013   CHOL 178 01/08/2013   TRIG 226* 01/08/2013   HDL 58 01/08/2013   LDLDIRECT 125.9 10/06/2007   LDLCALC 75 01/08/2013   ALT 8 01/11/2013   AST 21 01/11/2013   NA 137 01/11/2013   K 3.7 01/11/2013   CL 101 01/11/2013   CREATININE 0.64 01/11/2013   BUN 13 01/11/2013   CO2 26 01/11/2013   TSH 0.97 08/22/2013   INR 1.2* 08/22/2013   HGBA1C 5.3 01/08/2013        Assessment & Plan:

## 2014-08-01 NOTE — Assessment & Plan Note (Signed)
She would like to switch to lexapro - see Rx

## 2014-08-01 NOTE — Progress Notes (Signed)
Pre visit review using our clinic review tool, if applicable. No additional management support is needed unless otherwise documented below in the visit note. 

## 2014-08-01 NOTE — Assessment & Plan Note (Signed)
Continue with current prescription therapy as reflected on the Med list.  

## 2014-11-01 ENCOUNTER — Encounter: Payer: Self-pay | Admitting: Internal Medicine

## 2014-11-01 ENCOUNTER — Other Ambulatory Visit (INDEPENDENT_AMBULATORY_CARE_PROVIDER_SITE_OTHER): Payer: Medicare Other

## 2014-11-01 ENCOUNTER — Ambulatory Visit (INDEPENDENT_AMBULATORY_CARE_PROVIDER_SITE_OTHER): Payer: Medicare Other | Admitting: Internal Medicine

## 2014-11-01 VITALS — BP 150/80 | HR 69 | Temp 98.6°F | Wt 127.0 lb

## 2014-11-01 DIAGNOSIS — M353 Polymyalgia rheumatica: Secondary | ICD-10-CM

## 2014-11-01 DIAGNOSIS — E039 Hypothyroidism, unspecified: Secondary | ICD-10-CM | POA: Diagnosis not present

## 2014-11-01 DIAGNOSIS — R634 Abnormal weight loss: Secondary | ICD-10-CM

## 2014-11-01 DIAGNOSIS — I10 Essential (primary) hypertension: Secondary | ICD-10-CM

## 2014-11-01 DIAGNOSIS — I639 Cerebral infarction, unspecified: Secondary | ICD-10-CM | POA: Diagnosis not present

## 2014-11-01 DIAGNOSIS — F341 Dysthymic disorder: Secondary | ICD-10-CM | POA: Diagnosis not present

## 2014-11-01 DIAGNOSIS — F4321 Adjustment disorder with depressed mood: Secondary | ICD-10-CM

## 2014-11-01 LAB — SEDIMENTATION RATE: Sed Rate: 55 mm/hr — ABNORMAL HIGH (ref 0–22)

## 2014-11-01 LAB — BASIC METABOLIC PANEL
BUN: 17 mg/dL (ref 6–23)
CALCIUM: 9.6 mg/dL (ref 8.4–10.5)
CO2: 29 mEq/L (ref 19–32)
CREATININE: 0.84 mg/dL (ref 0.40–1.20)
Chloride: 96 mEq/L (ref 96–112)
GFR: 69.31 mL/min (ref >60.00)
GLUCOSE: 74 mg/dL (ref 70–99)
Potassium: 4.2 mEq/L (ref 3.5–5.1)
Sodium: 131 mEq/L — ABNORMAL LOW (ref 135–145)

## 2014-11-01 LAB — TSH: TSH: 0.71 u[IU]/mL (ref 0.35–4.50)

## 2014-11-01 MED ORDER — ALPRAZOLAM 0.5 MG PO TABS: 0.2500 mg | ORAL_TABLET | Freq: Two times a day (BID) | ORAL | Status: DC | PRN

## 2014-11-01 MED ORDER — PREDNISONE 5 MG PO TABS: 5.0000 mg | ORAL_TABLET | Freq: Every day | ORAL | Status: DC

## 2014-11-01 MED ORDER — MIRTAZAPINE 15 MG PO TABS: 15.0000 mg | ORAL_TABLET | Freq: Every day | ORAL | Status: AC

## 2014-11-01 NOTE — Assessment & Plan Note (Signed)
Doing better.   

## 2014-11-01 NOTE — Assessment & Plan Note (Signed)
Stable wt 

## 2014-11-01 NOTE — Progress Notes (Signed)
   Subjective:    HPI    The patient presents for a follow-up of  chronic hypertension, chronic dyslipidemia, memory problems controlled with medicines. Pt forgot to take her BP pill today. Pt wants to change back to Remeron  Her husband died in 09-16-2011  Family is worried about her wt loss - better F/u PMR sx's returned 9/13 she moved to a town house   Wt Readings from Last 3 Encounters:  11/01/14 127 lb (57.607 kg)  08/01/14 129 lb (58.514 kg)  03/20/14 123 lb (55.792 kg)   BP Readings from Last 3 Encounters:  11/01/14 150/80  08/01/14 140/85  03/20/14 120/84     Review of Systems  Constitutional: Positive for unexpected weight change. Negative for fever, chills, activity change, appetite change and fatigue.  HENT: Negative for congestion, mouth sores and sinus pressure.   Eyes: Negative for visual disturbance.  Respiratory: Negative for cough and chest tightness.   Gastrointestinal: Negative for nausea and abdominal pain.  Genitourinary: Negative for frequency, difficulty urinating and vaginal pain.  Musculoskeletal: Negative for back pain and gait problem.  Skin: Negative for pallor and rash.  Neurological: Negative for dizziness, tremors, weakness, numbness and headaches.  Psychiatric/Behavioral: Positive for sleep disturbance. Negative for suicidal ideas and confusion. The patient is nervous/anxious.       Objective:   Physical Exam Lab Results  Component Value Date   WBC 6.3 08/22/2013   HGB 13.1 08/22/2013   HCT 37.7 08/22/2013   PLT 176.0 08/22/2013   GLUCOSE 79 01/11/2013   CHOL 178 01/08/2013   TRIG 226* 01/08/2013   HDL 58 01/08/2013   LDLDIRECT 125.9 10/06/2007   LDLCALC 75 01/08/2013   ALT 8 01/11/2013   AST 21 01/11/2013   NA 137 01/11/2013   K 3.7 01/11/2013   CL 101 01/11/2013   CREATININE 0.64 01/11/2013   BUN 13 01/11/2013   CO2 26 01/11/2013   TSH 0.97 08/22/2013   INR 1.2* 08/22/2013   HGBA1C 5.3 01/08/2013        Assessment  & Plan:  Patient ID: Michele Diaz, female   DOB: Dec 07, 1931, 81 y.o.   MRN: [id number]

## 2014-11-01 NOTE — Assessment & Plan Note (Signed)
No relapse Continue with current prescription therapy as reflected on the Med list.  

## 2014-11-01 NOTE — Assessment & Plan Note (Signed)
Continue with current prescription therapy as reflected on the Med list. Take meds today

## 2014-11-01 NOTE — Progress Notes (Signed)
Pre visit review using our clinic review tool, if applicable. No additional management support is needed unless otherwise documented below in the visit note. 

## 2014-11-01 NOTE — Assessment & Plan Note (Signed)
Pt wants to change back to Remeron

## 2014-11-01 NOTE — Assessment & Plan Note (Signed)
Continue with current prednisone therapy as reflected on the Med list.

## 2014-11-30 ENCOUNTER — Other Ambulatory Visit: Payer: Self-pay | Admitting: Internal Medicine

## 2014-12-04 ENCOUNTER — Other Ambulatory Visit: Payer: Self-pay | Admitting: Internal Medicine

## 2014-12-31 ENCOUNTER — Other Ambulatory Visit: Payer: Self-pay | Admitting: Internal Medicine

## 2015-01-04 ENCOUNTER — Telehealth: Payer: Self-pay | Admitting: Internal Medicine

## 2015-01-04 MED ORDER — PANTOPRAZOLE SODIUM 40 MG PO TBEC: 40.0000 mg | DELAYED_RELEASE_TABLET | Freq: Every day | ORAL | Status: DC

## 2015-01-04 NOTE — Telephone Encounter (Signed)
Notified pt son refill has been sent...Michele Diaz

## 2015-01-04 NOTE — Telephone Encounter (Signed)
Patient is changing from [facility] to [address] in [place] and she needs pantoprazole (PROTONIX) 40 MG tablet [[id number] sent over there. She has had her other medication sent over there but can't seem to get this one.

## 2015-01-05 ENCOUNTER — Telehealth: Payer: Self-pay | Admitting: Internal Medicine

## 2015-01-05 NOTE — Telephone Encounter (Signed)
Prescription is $399. Pharmacy needs dr authorization.

## 2015-01-05 NOTE — Telephone Encounter (Signed)
We received PA reqeust for protonix today which i placed on your keyboard. Michele Diaz states that this has been going on for a few days, so we might already have on file. i did not see on your desk. Please call [facility] and give a status along with possible OTC alternatives in the meantime while we request PA

## 2015-01-08 NOTE — Telephone Encounter (Signed)
Protonix PA is approved from 11/09/14 through 01/08/16. Pharmacy informed.

## 2015-02-06 ENCOUNTER — Ambulatory Visit: Payer: Medicare Other | Admitting: Internal Medicine

## 2015-02-13 ENCOUNTER — Other Ambulatory Visit (INDEPENDENT_AMBULATORY_CARE_PROVIDER_SITE_OTHER): Payer: Medicare Other

## 2015-02-13 ENCOUNTER — Encounter: Payer: Self-pay | Admitting: Internal Medicine

## 2015-02-13 ENCOUNTER — Ambulatory Visit (INDEPENDENT_AMBULATORY_CARE_PROVIDER_SITE_OTHER): Payer: Medicare Other | Admitting: Internal Medicine

## 2015-02-13 VITALS — BP 128/80 | HR 94 | Temp 97.9°F | Ht 62.0 in | Wt 129.8 lb

## 2015-02-13 DIAGNOSIS — M353 Polymyalgia rheumatica: Secondary | ICD-10-CM

## 2015-02-13 DIAGNOSIS — I639 Cerebral infarction, unspecified: Secondary | ICD-10-CM

## 2015-02-13 DIAGNOSIS — E038 Other specified hypothyroidism: Secondary | ICD-10-CM

## 2015-02-13 DIAGNOSIS — I10 Essential (primary) hypertension: Secondary | ICD-10-CM

## 2015-02-13 DIAGNOSIS — E034 Atrophy of thyroid (acquired): Secondary | ICD-10-CM

## 2015-02-13 DIAGNOSIS — Z23 Encounter for immunization: Secondary | ICD-10-CM | POA: Diagnosis not present

## 2015-02-13 DIAGNOSIS — F341 Dysthymic disorder: Secondary | ICD-10-CM

## 2015-02-13 LAB — BASIC METABOLIC PANEL
BUN: 17 mg/dL (ref 6–23)
CHLORIDE: 95 meq/L — AB (ref 96–112)
CO2: 28 mEq/L (ref 19–32)
Calcium: 9.3 mg/dL (ref 8.4–10.5)
Creatinine, Ser: 0.89 mg/dL (ref 0.40–1.20)
GFR: 64.79 mL/min (ref >60.00)
Glucose, Bld: 83 mg/dL (ref 70–99)
POTASSIUM: 3.7 meq/L (ref 3.5–5.1)
SODIUM: 129 meq/L — AB (ref 135–145)

## 2015-02-13 LAB — SEDIMENTATION RATE: SED RATE: 73 mm/h — AB (ref 0–22)

## 2015-02-13 LAB — TSH: TSH: 0.46 u[IU]/mL (ref 0.35–4.50)

## 2015-02-13 MED ORDER — ALPRAZOLAM 0.5 MG PO TABS: 0.2500 mg | ORAL_TABLET | Freq: Two times a day (BID) | ORAL | Status: DC | PRN

## 2015-02-13 NOTE — Assessment & Plan Note (Signed)
On Losartan HCTZ

## 2015-02-13 NOTE — Assessment & Plan Note (Signed)
Chronic On Levothroid - 

## 2015-02-13 NOTE — Progress Notes (Signed)
   Subjective:    HPI    The patient presents for a follow-up of  chronic hypertension, chronic dyslipidemia, memory problems controlled with medicines. Just visited her sister w/dementia in [place].  Her husband died in October 03, 2011  Family is worried about her wt loss - better F/u PMR sx's returned 9/13 she moved to a town house   Wt Readings from Last 3 Encounters:  02/13/15 129 lb 12 oz (58.854 kg)  11/01/14 127 lb (57.607 kg)  08/01/14 129 lb (58.514 kg)   BP Readings from Last 3 Encounters:  02/13/15 128/80  11/01/14 150/80  08/01/14 140/85     Review of Systems  Constitutional: Positive for unexpected weight change. Negative for fever, chills, activity change, appetite change and fatigue.  HENT: Negative for congestion, mouth sores and sinus pressure.   Eyes: Negative for visual disturbance.  Respiratory: Negative for cough and chest tightness.   Gastrointestinal: Negative for nausea and abdominal pain.  Genitourinary: Negative for frequency, difficulty urinating and vaginal pain.  Musculoskeletal: Negative for back pain and gait problem.  Skin: Negative for pallor and rash.  Neurological: Negative for dizziness, tremors, weakness, numbness and headaches.  Psychiatric/Behavioral: Positive for sleep disturbance. Negative for suicidal ideas and confusion. The patient is nervous/anxious.       Objective:   Physical Exam  Constitutional: She appears well-developed. No distress.  HENT:  Head: Normocephalic.  Right Ear: External ear normal.  Left Ear: External ear normal.  Nose: Nose normal.  Mouth/Throat: Oropharynx is clear and moist.  Eyes: Conjunctivae are normal. Pupils are equal, round, and reactive to light. Right eye exhibits no discharge. Left eye exhibits no discharge.  Neck: Normal range of motion. Neck supple. No JVD present. No tracheal deviation present. No thyromegaly present.  Cardiovascular: Normal rate, regular rhythm and normal heart sounds.    Pulmonary/Chest: No stridor. No respiratory distress. She has no wheezes.  Abdominal: Soft. Bowel sounds are normal. She exhibits no distension and no mass. There is no tenderness. There is no rebound and no guarding.  Musculoskeletal: She exhibits no edema or tenderness.  Lymphadenopathy:    She has no cervical adenopathy.  Neurological: She displays normal reflexes. No cranial nerve deficit. She exhibits normal muscle tone. Coordination normal.  Skin: No rash noted. No erythema.  Psychiatric: She has a normal mood and affect. Her behavior is normal. Judgment and thought content normal.   Lab Results  Component Value Date   WBC 6.3 08/22/2013   HGB 13.1 08/22/2013   HCT 37.7 08/22/2013   PLT 176.0 08/22/2013   GLUCOSE 74 11/01/2014   CHOL 178 01/08/2013   TRIG 226* 01/08/2013   HDL 58 01/08/2013   LDLDIRECT 125.9 10/06/2007   LDLCALC 75 01/08/2013   ALT 8 01/11/2013   AST 21 01/11/2013   NA 131* 11/01/2014   K 4.2 11/01/2014   CL 96 11/01/2014   CREATININE 0.84 11/01/2014   BUN 17 11/01/2014   CO2 29 11/01/2014   TSH 0.71 11/01/2014   INR 1.2* 08/22/2013   HGBA1C 5.3 01/08/2013        Assessment & Plan:  Patient ID: Michele Diaz, female   DOB: [birth year], 81 y.o.   MRN: [id number]

## 2015-02-13 NOTE — Assessment & Plan Note (Signed)
On Prednisone 5 mg/d pc  Potential benefits of a long term steroid  use as well as potential risks  and complications were explained to the patient and were aknowledged.

## 2015-02-13 NOTE — Assessment & Plan Note (Signed)
Doing better.   

## 2015-02-13 NOTE — Progress Notes (Signed)
Pre visit review using our clinic review tool, if applicable. No additional management support is needed unless otherwise documented below in the visit note. 

## 2015-02-15 ENCOUNTER — Other Ambulatory Visit: Payer: Self-pay | Admitting: Internal Medicine

## 2015-04-23 DIAGNOSIS — Z833 Family history of diabetes mellitus: Secondary | ICD-10-CM | POA: Diagnosis not present

## 2015-04-23 DIAGNOSIS — H5212 Myopia, left eye: Secondary | ICD-10-CM | POA: Diagnosis not present

## 2015-04-23 DIAGNOSIS — H2513 Age-related nuclear cataract, bilateral: Secondary | ICD-10-CM | POA: Diagnosis not present

## 2015-04-23 DIAGNOSIS — H25013 Cortical age-related cataract, bilateral: Secondary | ICD-10-CM | POA: Diagnosis not present

## 2015-04-23 DIAGNOSIS — H5231 Anisometropia: Secondary | ICD-10-CM | POA: Diagnosis not present

## 2015-04-23 DIAGNOSIS — H5702 Anisocoria: Secondary | ICD-10-CM | POA: Diagnosis not present

## 2015-04-23 DIAGNOSIS — H04123 Dry eye syndrome of bilateral lacrimal glands: Secondary | ICD-10-CM | POA: Diagnosis not present

## 2015-04-23 DIAGNOSIS — H524 Presbyopia: Secondary | ICD-10-CM | POA: Diagnosis not present

## 2015-04-23 DIAGNOSIS — H35363 Drusen (degenerative) of macula, bilateral: Secondary | ICD-10-CM | POA: Diagnosis not present

## 2015-04-23 DIAGNOSIS — H25043 Posterior subcapsular polar age-related cataract, bilateral: Secondary | ICD-10-CM | POA: Diagnosis not present

## 2015-04-23 DIAGNOSIS — I10 Essential (primary) hypertension: Secondary | ICD-10-CM | POA: Diagnosis not present

## 2015-04-23 DIAGNOSIS — H43813 Vitreous degeneration, bilateral: Secondary | ICD-10-CM | POA: Diagnosis not present

## 2015-05-15 ENCOUNTER — Ambulatory Visit (INDEPENDENT_AMBULATORY_CARE_PROVIDER_SITE_OTHER): Payer: Medicare Other | Admitting: Internal Medicine

## 2015-05-15 ENCOUNTER — Encounter: Payer: Self-pay | Admitting: Internal Medicine

## 2015-05-15 VITALS — BP 130/78 | HR 74 | Wt 126.0 lb

## 2015-05-15 DIAGNOSIS — R413 Other amnesia: Secondary | ICD-10-CM | POA: Diagnosis not present

## 2015-05-15 DIAGNOSIS — R739 Hyperglycemia, unspecified: Secondary | ICD-10-CM

## 2015-05-15 DIAGNOSIS — I10 Essential (primary) hypertension: Secondary | ICD-10-CM | POA: Diagnosis not present

## 2015-05-15 DIAGNOSIS — I639 Cerebral infarction, unspecified: Secondary | ICD-10-CM

## 2015-05-15 DIAGNOSIS — M353 Polymyalgia rheumatica: Secondary | ICD-10-CM

## 2015-05-15 LAB — GLUCOSE, POCT (MANUAL RESULT ENTRY): POC Glucose: 91 mg/dl (ref 70–99)

## 2015-05-15 MED ORDER — CLOPIDOGREL BISULFATE 75 MG PO TABS: ORAL_TABLET | ORAL | Status: DC

## 2015-05-15 MED ORDER — DONEPEZIL HCL 5 MG PO TABS: 5.0000 mg | ORAL_TABLET | Freq: Every day | ORAL | Status: DC

## 2015-05-15 MED ORDER — PREDNISONE 5 MG PO TABS: 5.0000 mg | ORAL_TABLET | Freq: Every day | ORAL | Status: DC

## 2015-05-15 NOTE — Assessment & Plan Note (Signed)
No relapse 

## 2015-05-15 NOTE — Assessment & Plan Note (Signed)
Chronic On Losartan HCTZ

## 2015-05-15 NOTE — Addendum Note (Signed)
Addended by: Cresenciano Lick on: 05/15/2015 02:40 PM   Modules accepted: Orders

## 2015-05-15 NOTE — Assessment & Plan Note (Signed)
   Potential benefits of a long term steroids  use as well as potential risks  and complications were explained to the patient and were aknowledged.   Restart Prednisone 5 mg/d pc

## 2015-05-15 NOTE — Progress Notes (Signed)
Pre visit review using our clinic review tool, if applicable. No additional management support is needed unless otherwise documented below in the visit note. 

## 2015-05-15 NOTE — Assessment & Plan Note (Signed)
On Aricept 

## 2015-05-15 NOTE — Progress Notes (Signed)
Subjective:  Patient ID: Michele Diaz, female    DOB: Apr 26, 1932  Age: 81 y.o. MRN: [id number]  CC: No chief complaint on file.   HPI Michele Diaz presents for HTN, dyslipidemia, memory issues f/u. Pt had a GI illness 2 wks ago with n/v/d - feeling tired. Pt quit Prednisone 1 mo ago for ?reason. Pt is unsure if she is using imuran...                     Outpatient Prescriptions Prior to Visit  Medication Sig Dispense Refill  . ALPRAZolam (XANAX) 0.5 MG tablet Take 0.5-1 tablets (0.25-0.5 mg total) by mouth 2 (two) times daily as needed for anxiety or sleep. 180 tablet 0  . azaTHIOprine (IMURAN) 50 MG tablet take 1 tablet by mouth once daily 90 tablet 3  . cholecalciferol (VITAMIN D) 1000 UNITS tablet Take 1 tablet (1,000 Units total) by mouth daily. 30 tablet 1  . clopidogrel (PLAVIX) 75 MG tablet take 1 tablet by mouth once daily with BREAKFAST 90 tablet 3  . donepezil (ARICEPT) 5 MG tablet Take 1 tablet (5 mg total) by mouth daily. 90 tablet 3  . escitalopram (LEXAPRO) 10 MG tablet Take 10 mg by mouth daily.  4  . levothyroxine (SYNTHROID, LEVOTHROID) 88 MCG tablet TAKE 1 TABLET BY MOUTH EVERY DAY 90 tablet 3  . losartan-hydrochlorothiazide (HYZAAR) 100-12.5 MG per tablet TAKE 1 TABLET BY MOUTH DAILY 30 tablet 11  . meclizine (ANTIVERT) 12.5 MG tablet Take 1 tablet (12.5 mg total) by mouth 2 (two) times daily. 180 tablet 1  . pantoprazole (PROTONIX) 40 MG tablet Take 1 tablet (40 mg total) by mouth daily. 90 tablet 3  . predniSONE (DELTASONE) 5 MG tablet Take 1 tablet (5 mg total) by mouth daily. 90 tablet 1  . mirtazapine (REMERON) 15 MG tablet   5   No facility-administered medications prior to visit.    ROS Review of Systems  Constitutional: Positive for fatigue. Negative for chills, activity change, appetite change and unexpected weight change.  HENT: Negative for congestion, mouth sores and sinus pressure.   Eyes: Negative for visual disturbance.  Respiratory: Negative  for cough and chest tightness.   Gastrointestinal: Negative for nausea and abdominal pain.  Genitourinary: Negative for frequency, difficulty urinating and vaginal pain.  Musculoskeletal: Positive for myalgias, back pain, arthralgias, neck pain and neck stiffness. Negative for gait problem.  Skin: Negative for pallor and rash.  Neurological: Negative for dizziness, tremors, weakness, numbness and headaches.  Psychiatric/Behavioral: Negative for suicidal ideas, confusion, sleep disturbance and dysphoric mood.    Objective:  BP 130/78 mmHg  Pulse 74  Wt 126 lb (57.153 kg)  SpO2 96%  BP Readings from Last 3 Encounters:  05/15/15 130/78  02/13/15 128/80  11/01/14 150/80    Wt Readings from Last 3 Encounters:  05/15/15 126 lb (57.153 kg)  02/13/15 129 lb 12 oz (58.854 kg)  11/01/14 127 lb (57.607 kg)    Physical Exam  Constitutional: She appears well-developed. No distress.  HENT:  Head: Normocephalic.  Right Ear: External ear normal.  Left Ear: External ear normal.  Nose: Nose normal.  Mouth/Throat: Oropharynx is clear and moist.  Eyes: Conjunctivae are normal. Pupils are equal, round, and reactive to light. Right eye exhibits no discharge. Left eye exhibits no discharge.  Neck: Normal range of motion. Neck supple. No JVD present. No tracheal deviation present. No thyromegaly present.  Cardiovascular: Normal rate, regular rhythm and normal heart sounds.  Pulmonary/Chest: No stridor. No respiratory distress. She has no wheezes.  Abdominal: Soft. Bowel sounds are normal. She exhibits no distension and no mass. There is no tenderness. There is no rebound and no guarding.  Musculoskeletal: She exhibits no edema or tenderness.  Lymphadenopathy:    She has no cervical adenopathy.  Neurological: She displays normal reflexes. No cranial nerve deficit. She exhibits normal muscle tone. Coordination normal.  Skin: No rash noted. No erythema.  Psychiatric: She has a normal mood and  affect. Her behavior is normal. Judgment and thought content normal.  LS, neck, hips tender  Lab Results  Component Value Date   WBC 6.3 08/22/2013   HGB 13.1 08/22/2013   HCT 37.7 08/22/2013   PLT 176.0 08/22/2013   GLUCOSE 83 02/13/2015   CHOL 178 01/08/2013   TRIG 226* 01/08/2013   HDL 58 01/08/2013   LDLDIRECT 125.9 10/06/2007   LDLCALC 75 01/08/2013   ALT 8 01/11/2013   AST 21 01/11/2013   NA 129* 02/13/2015   K 3.7 02/13/2015   CL 95* 02/13/2015   CREATININE 0.89 02/13/2015   BUN 17 02/13/2015   CO2 28 02/13/2015   TSH 0.46 02/13/2015   INR 1.2* 08/22/2013   HGBA1C 5.3 01/08/2013        No results found.  Assessment & Plan:   There are no diagnoses linked to this encounter. I am having [name] maintain her cholecalciferol, donepezil, meclizine, clopidogrel, azaTHIOprine, predniSONE, levothyroxine, pantoprazole, escitalopram, mirtazapine, ALPRAZolam, and losartan-hydrochlorothiazide.  No orders of the defined types were placed in this encounter.     Follow-up: No Follow-up on file.  Walker Kehr, MD

## 2015-05-15 NOTE — Assessment & Plan Note (Signed)
  Restart Prednisone 5 mg/d pc

## 2015-05-29 DIAGNOSIS — H25041 Posterior subcapsular polar age-related cataract, right eye: Secondary | ICD-10-CM | POA: Diagnosis not present

## 2015-05-29 DIAGNOSIS — H04123 Dry eye syndrome of bilateral lacrimal glands: Secondary | ICD-10-CM | POA: Diagnosis not present

## 2015-05-29 DIAGNOSIS — H35363 Drusen (degenerative) of macula, bilateral: Secondary | ICD-10-CM | POA: Diagnosis not present

## 2015-05-29 DIAGNOSIS — H25011 Cortical age-related cataract, right eye: Secondary | ICD-10-CM | POA: Diagnosis not present

## 2015-05-29 DIAGNOSIS — Z01818 Encounter for other preprocedural examination: Secondary | ICD-10-CM | POA: Diagnosis not present

## 2015-05-29 DIAGNOSIS — H5702 Anisocoria: Secondary | ICD-10-CM | POA: Diagnosis not present

## 2015-05-29 DIAGNOSIS — H2511 Age-related nuclear cataract, right eye: Secondary | ICD-10-CM | POA: Diagnosis not present

## 2015-05-29 DIAGNOSIS — H43813 Vitreous degeneration, bilateral: Secondary | ICD-10-CM | POA: Diagnosis not present

## 2015-06-13 DIAGNOSIS — H2511 Age-related nuclear cataract, right eye: Secondary | ICD-10-CM | POA: Diagnosis not present

## 2015-06-13 DIAGNOSIS — H25811 Combined forms of age-related cataract, right eye: Secondary | ICD-10-CM | POA: Diagnosis not present

## 2015-06-13 DIAGNOSIS — H25041 Posterior subcapsular polar age-related cataract, right eye: Secondary | ICD-10-CM | POA: Diagnosis not present

## 2015-06-13 DIAGNOSIS — H25011 Cortical age-related cataract, right eye: Secondary | ICD-10-CM | POA: Diagnosis not present

## 2015-06-20 DIAGNOSIS — H25012 Cortical age-related cataract, left eye: Secondary | ICD-10-CM | POA: Diagnosis not present

## 2015-06-20 DIAGNOSIS — H25042 Posterior subcapsular polar age-related cataract, left eye: Secondary | ICD-10-CM | POA: Diagnosis not present

## 2015-06-27 DIAGNOSIS — H25042 Posterior subcapsular polar age-related cataract, left eye: Secondary | ICD-10-CM | POA: Diagnosis not present

## 2015-06-27 DIAGNOSIS — H268 Other specified cataract: Secondary | ICD-10-CM | POA: Diagnosis not present

## 2015-06-27 DIAGNOSIS — M50021 Cervical disc disorder at C4-C5 level with myelopathy: Secondary | ICD-10-CM | POA: Diagnosis not present

## 2015-06-27 DIAGNOSIS — H2512 Age-related nuclear cataract, left eye: Secondary | ICD-10-CM | POA: Diagnosis not present

## 2015-06-27 DIAGNOSIS — H25812 Combined forms of age-related cataract, left eye: Secondary | ICD-10-CM | POA: Diagnosis not present

## 2015-06-27 DIAGNOSIS — Z981 Arthrodesis status: Secondary | ICD-10-CM | POA: Diagnosis not present

## 2015-07-18 ENCOUNTER — Other Ambulatory Visit: Payer: Self-pay | Admitting: Internal Medicine

## 2015-07-19 DIAGNOSIS — Z1231 Encounter for screening mammogram for malignant neoplasm of breast: Secondary | ICD-10-CM | POA: Diagnosis not present

## 2015-07-28 ENCOUNTER — Other Ambulatory Visit: Payer: Self-pay | Admitting: Internal Medicine

## 2015-07-30 ENCOUNTER — Other Ambulatory Visit: Payer: Self-pay

## 2015-07-30 MED ORDER — CLOPIDOGREL BISULFATE 75 MG PO TABS: ORAL_TABLET | ORAL | Status: DC

## 2015-08-15 DIAGNOSIS — H35363 Drusen (degenerative) of macula, bilateral: Secondary | ICD-10-CM | POA: Diagnosis not present

## 2015-08-17 ENCOUNTER — Ambulatory Visit: Payer: Medicare Other | Admitting: Internal Medicine

## 2015-09-28 ENCOUNTER — Ambulatory Visit (INDEPENDENT_AMBULATORY_CARE_PROVIDER_SITE_OTHER): Payer: Medicare Other | Admitting: Internal Medicine

## 2015-09-28 ENCOUNTER — Other Ambulatory Visit (INDEPENDENT_AMBULATORY_CARE_PROVIDER_SITE_OTHER): Payer: Medicare Other

## 2015-09-28 ENCOUNTER — Encounter: Payer: Self-pay | Admitting: Internal Medicine

## 2015-09-28 VITALS — BP 120/80 | HR 80 | Wt 121.0 lb

## 2015-09-28 DIAGNOSIS — M353 Polymyalgia rheumatica: Secondary | ICD-10-CM | POA: Diagnosis not present

## 2015-09-28 DIAGNOSIS — I10 Essential (primary) hypertension: Secondary | ICD-10-CM

## 2015-09-28 DIAGNOSIS — E038 Other specified hypothyroidism: Secondary | ICD-10-CM

## 2015-09-28 DIAGNOSIS — I6349 Cerebral infarction due to embolism of other cerebral artery: Secondary | ICD-10-CM

## 2015-09-28 DIAGNOSIS — Z23 Encounter for immunization: Secondary | ICD-10-CM | POA: Diagnosis not present

## 2015-09-28 DIAGNOSIS — E034 Atrophy of thyroid (acquired): Secondary | ICD-10-CM

## 2015-09-28 DIAGNOSIS — E871 Hypo-osmolality and hyponatremia: Secondary | ICD-10-CM | POA: Diagnosis not present

## 2015-09-28 DIAGNOSIS — R5383 Other fatigue: Secondary | ICD-10-CM

## 2015-09-28 LAB — HEPATIC FUNCTION PANEL
ALT: 25 U/L (ref 0–35)
AST: 40 U/L — ABNORMAL HIGH (ref 0–37)
Albumin: 4.1 g/dL (ref 3.5–5.2)
Alkaline Phosphatase: 72 U/L (ref 39–117)
BILIRUBIN DIRECT: 0.2 mg/dL (ref 0.0–0.3)
BILIRUBIN TOTAL: 1.1 mg/dL (ref 0.2–1.2)
Total Protein: 8.7 g/dL — ABNORMAL HIGH (ref 6.0–8.3)

## 2015-09-28 LAB — BASIC METABOLIC PANEL
BUN: 16 mg/dL (ref 6–23)
CALCIUM: 9.7 mg/dL (ref 8.4–10.5)
CO2: 28 meq/L (ref 19–32)
CREATININE: 1.07 mg/dL (ref 0.40–1.20)
Chloride: 94 mEq/L — ABNORMAL LOW (ref 96–112)
GFR: 52.3 mL/min — ABNORMAL LOW (ref >60.00)
Glucose, Bld: 94 mg/dL (ref 70–99)
Potassium: 3.7 mEq/L (ref 3.5–5.1)
SODIUM: 130 meq/L — AB (ref 135–145)

## 2015-09-28 LAB — TSH: TSH: 0.64 u[IU]/mL (ref 0.35–4.50)

## 2015-09-28 LAB — SEDIMENTATION RATE: SED RATE: 60 mm/h — AB (ref 0–22)

## 2015-09-28 MED ORDER — AZATHIOPRINE 50 MG PO TABS: 50.0000 mg | ORAL_TABLET | Freq: Every day | ORAL | Status: DC

## 2015-09-28 MED ORDER — PREDNISONE 1 MG PO TABS: ORAL_TABLET | ORAL | Status: DC

## 2015-09-28 MED ORDER — LOSARTAN POTASSIUM 100 MG PO TABS: 100.0000 mg | ORAL_TABLET | Freq: Every day | ORAL | Status: DC

## 2015-09-28 NOTE — Assessment & Plan Note (Signed)
On Plavix 

## 2015-09-28 NOTE — Assessment & Plan Note (Signed)
On Levothroid 

## 2015-09-28 NOTE — Progress Notes (Signed)
Subjective:  Patient ID: Michele Diaz, female    DOB: Dec 01, 1931  Age: 82 y.o. MRN: [id number]  CC: No chief complaint on file.   HPI Michele Diaz presents for anxiety, hypothyroidism, PMR On 2.5 mg prednisone a day  Outpatient Prescriptions Prior to Visit  Medication Sig Dispense Refill  . ALPRAZolam (XANAX) 0.5 MG tablet Take 0.5-1 tablets (0.25-0.5 mg total) by mouth 2 (two) times daily as needed for anxiety or sleep. 180 tablet 0  . azaTHIOprine (IMURAN) 50 MG tablet take 1 tablet by mouth once daily 90 tablet 3  . cholecalciferol (VITAMIN D) 1000 UNITS tablet Take 1 tablet (1,000 Units total) by mouth daily. 30 tablet 1  . clopidogrel (PLAVIX) 75 MG tablet TAKE 1 TABLET BY MOUTH EVERY MORNING 90 tablet 1  . donepezil (ARICEPT) 5 MG tablet Take 1 tablet (5 mg total) by mouth daily. 90 tablet 3  . escitalopram (LEXAPRO) 10 MG tablet TAKE 1 TABLET BY MOUTH DAILY 30 tablet 11  . levothyroxine (SYNTHROID, LEVOTHROID) 88 MCG tablet TAKE 1 TABLET BY MOUTH EVERY DAY 90 tablet 3  . losartan-hydrochlorothiazide (HYZAAR) 100-12.5 MG per tablet TAKE 1 TABLET BY MOUTH DAILY 30 tablet 11  . meclizine (ANTIVERT) 12.5 MG tablet Take 1 tablet (12.5 mg total) by mouth 2 (two) times daily. 180 tablet 1  . pantoprazole (PROTONIX) 40 MG tablet Take 1 tablet (40 mg total) by mouth daily. 90 tablet 3  . predniSONE (DELTASONE) 5 MG tablet Take 1 tablet (5 mg total) by mouth daily. 90 tablet 1  . clopidogrel (PLAVIX) 75 MG tablet take 1 tablet by mouth once daily with BREAKFAST 90 tablet 3   No facility-administered medications prior to visit.    ROS Review of Systems  Constitutional: Negative for chills, activity change, appetite change, fatigue and unexpected weight change.  HENT: Negative for congestion, mouth sores and sinus pressure.   Eyes: Negative for visual disturbance.  Respiratory: Negative for cough and chest tightness.   Gastrointestinal: Negative for nausea and abdominal pain.    Genitourinary: Negative for frequency, difficulty urinating and vaginal pain.  Musculoskeletal: Negative for back pain and gait problem.  Skin: Negative for pallor and rash.  Neurological: Negative for dizziness, tremors, weakness, numbness and headaches.  Psychiatric/Behavioral: Negative for confusion and sleep disturbance. The patient is not nervous/anxious.     Objective:  BP 120/80 mmHg  Pulse 80  Wt 121 lb (54.885 kg)  SpO2 96%  BP Readings from Last 3 Encounters:  09/28/15 120/80  05/15/15 130/78  02/13/15 128/80    Wt Readings from Last 3 Encounters:  09/28/15 121 lb (54.885 kg)  05/15/15 126 lb (57.153 kg)  02/13/15 129 lb 12 oz (58.854 kg)    Physical Exam  Lab Results  Component Value Date   WBC 6.3 08/22/2013   HGB 13.1 08/22/2013   HCT 37.7 08/22/2013   PLT 176.0 08/22/2013   GLUCOSE 83 02/13/2015   CHOL 178 01/08/2013   TRIG 226* 01/08/2013   HDL 58 01/08/2013   LDLDIRECT 125.9 10/06/2007   LDLCALC 75 01/08/2013   ALT 8 01/11/2013   AST 21 01/11/2013   NA 129* 02/13/2015   K 3.7 02/13/2015   CL 95* 02/13/2015   CREATININE 0.89 02/13/2015   BUN 17 02/13/2015   CO2 28 02/13/2015   TSH 0.46 02/13/2015   INR 1.2* 08/22/2013   HGBA1C 5.3 01/08/2013    No results found.  Assessment & Plan:   There are no diagnoses linked  to this encounter. I am having [name] maintain her cholecalciferol, meclizine, azaTHIOprine, levothyroxine, pantoprazole, ALPRAZolam, losartan-hydrochlorothiazide, donepezil, predniSONE, escitalopram, and clopidogrel.  No orders of the defined types were placed in this encounter.     Follow-up: No Follow-up on file.  Walker Kehr, MD

## 2015-09-28 NOTE — Assessment & Plan Note (Signed)
1/17 - poss due to HCTZ - d/c'd HCTZ

## 2015-09-28 NOTE — Progress Notes (Signed)
Pre visit review using our clinic review tool, if applicable. No additional management support is needed unless otherwise documented below in the visit note. 

## 2015-09-28 NOTE — Assessment & Plan Note (Addendum)
Will taper off Deltasone from 2.5 mg/d down to 1 in 1 months: continue with 1 mg/day

## 2015-09-28 NOTE — Assessment & Plan Note (Signed)
On prednisone 

## 2015-09-28 NOTE — Assessment & Plan Note (Signed)
1/17 - hyponatremia - poss due to HCTZ - d/c'd HCTZ

## 2015-10-01 NOTE — Addendum Note (Signed)
Addended by: Cresenciano Lick on: 10/01/2015 12:02 PM   Modules accepted: Orders

## 2015-10-04 ENCOUNTER — Encounter: Payer: Self-pay | Admitting: Cardiology

## 2015-10-06 ENCOUNTER — Encounter: Payer: Self-pay | Admitting: Gastroenterology

## 2015-12-25 ENCOUNTER — Ambulatory Visit (INDEPENDENT_AMBULATORY_CARE_PROVIDER_SITE_OTHER): Payer: Medicare Other | Admitting: Internal Medicine

## 2015-12-25 ENCOUNTER — Other Ambulatory Visit (INDEPENDENT_AMBULATORY_CARE_PROVIDER_SITE_OTHER): Payer: Medicare Other

## 2015-12-25 ENCOUNTER — Encounter: Payer: Self-pay | Admitting: Internal Medicine

## 2015-12-25 VITALS — BP 130/90 | HR 97 | Wt 116.0 lb

## 2015-12-25 DIAGNOSIS — E871 Hypo-osmolality and hyponatremia: Secondary | ICD-10-CM

## 2015-12-25 DIAGNOSIS — I6349 Cerebral infarction due to embolism of other cerebral artery: Secondary | ICD-10-CM

## 2015-12-25 DIAGNOSIS — F341 Dysthymic disorder: Secondary | ICD-10-CM

## 2015-12-25 DIAGNOSIS — E034 Atrophy of thyroid (acquired): Secondary | ICD-10-CM

## 2015-12-25 DIAGNOSIS — I10 Essential (primary) hypertension: Secondary | ICD-10-CM | POA: Diagnosis not present

## 2015-12-25 DIAGNOSIS — E038 Other specified hypothyroidism: Secondary | ICD-10-CM

## 2015-12-25 DIAGNOSIS — R42 Dizziness and giddiness: Secondary | ICD-10-CM

## 2015-12-25 DIAGNOSIS — R634 Abnormal weight loss: Secondary | ICD-10-CM

## 2015-12-25 DIAGNOSIS — F4321 Adjustment disorder with depressed mood: Secondary | ICD-10-CM

## 2015-12-25 LAB — SEDIMENTATION RATE: SED RATE: 66 mm/h — AB (ref 0–22)

## 2015-12-25 LAB — BASIC METABOLIC PANEL
BUN: 10 mg/dL (ref 6–23)
CALCIUM: 9.8 mg/dL (ref 8.4–10.5)
CO2: 27 meq/L (ref 19–32)
Chloride: 103 mEq/L (ref 96–112)
Creatinine, Ser: 0.82 mg/dL (ref 0.40–1.20)
GFR: 71.06 mL/min (ref >60.00)
Glucose, Bld: 118 mg/dL — ABNORMAL HIGH (ref 70–99)
Potassium: 3.6 mEq/L (ref 3.5–5.1)
SODIUM: 136 meq/L (ref 135–145)

## 2015-12-25 MED ORDER — FEXOFENADINE HCL 180 MG PO TABS: 180.0000 mg | ORAL_TABLET | Freq: Every day | ORAL | Status: DC

## 2015-12-25 MED ORDER — TRIAMCINOLONE ACETONIDE 0.1 % EX CREA: 1.0000 "application " | TOPICAL_CREAM | Freq: Three times a day (TID) | CUTANEOUS | Status: DC

## 2015-12-25 NOTE — Assessment & Plan Note (Signed)
Join [employer], government or [employer] [place] if you can [facility])

## 2015-12-25 NOTE — Progress Notes (Signed)
Pre visit review using our clinic review tool, if applicable. No additional management support is needed unless otherwise documented below in the visit note. 

## 2015-12-25 NOTE — Assessment & Plan Note (Signed)
On levothroid 

## 2015-12-25 NOTE — Patient Instructions (Signed)
Join [employer] research officer, government or [employer] if you can [facility])

## 2015-12-25 NOTE — Assessment & Plan Note (Signed)
Off HCTZ Labs

## 2015-12-25 NOTE — Progress Notes (Signed)
Subjective:  Patient ID: Michele Diaz, female    DOB: [birth year]  Age: 82 y.o. MRN: [id number]  CC: No chief complaint on file.   HPI AISSATA DELICH presents for hypothyroidism, anxiety, PMR. The pt fell 3 wks ago - she was walking out of her bathroom and ?tripped - fell forward. No LOC. C/o rash from bandaids  Outpatient Prescriptions Prior to Visit  Medication Sig Dispense Refill  . ALPRAZolam (XANAX) 0.5 MG tablet Take 0.5-1 tablets (0.25-0.5 mg total) by mouth 2 (two) times daily as needed for anxiety or sleep. 180 tablet 0  . azaTHIOprine (IMURAN) 50 MG tablet Take 1 tablet (50 mg total) by mouth daily. 90 tablet 3  . cholecalciferol (VITAMIN D) 1000 UNITS tablet Take 1 tablet (1,000 Units total) by mouth daily. 30 tablet 1  . clopidogrel (PLAVIX) 75 MG tablet TAKE 1 TABLET BY MOUTH EVERY MORNING 90 tablet 1  . escitalopram (LEXAPRO) 10 MG tablet TAKE 1 TABLET BY MOUTH DAILY 30 tablet 11  . levothyroxine (SYNTHROID, LEVOTHROID) 88 MCG tablet TAKE 1 TABLET BY MOUTH EVERY DAY 90 tablet 3  . losartan (COZAAR) 100 MG tablet Take 1 tablet (100 mg total) by mouth daily. 30 tablet 11  . meclizine (ANTIVERT) 12.5 MG tablet Take 1 tablet (12.5 mg total) by mouth 2 (two) times daily. 180 tablet 1  . pantoprazole (PROTONIX) 40 MG tablet Take 1 tablet (40 mg total) by mouth daily. 90 tablet 3  . donepezil (ARICEPT) 5 MG tablet Take 1 tablet (5 mg total) by mouth daily. (Patient not taking: Reported on 12/25/2015) 90 tablet 3  . predniSONE (DELTASONE) 1 MG tablet Take 2 mg/d x 1 month, then 1 mg/d x 1 month, then stop (Patient not taking: Reported on 12/25/2015) 100 tablet 0   No facility-administered medications prior to visit.    ROS Review of Systems  Constitutional: Positive for fatigue. Negative for chills, activity change, appetite change and unexpected weight change.  HENT: Positive for congestion. Negative for mouth sores and sinus pressure.   Eyes: Negative for visual disturbance.    Respiratory: Negative for cough and chest tightness.   Gastrointestinal: Negative for nausea and abdominal pain.  Genitourinary: Negative for frequency, difficulty urinating and vaginal pain.  Musculoskeletal: Positive for gait problem. Negative for back pain.  Skin: Positive for rash. Negative for pallor.  Neurological: Negative for dizziness, tremors, weakness, numbness and headaches.  Psychiatric/Behavioral: Negative for suicidal ideas, confusion and sleep disturbance.    Objective:  BP 130/90 mmHg  Pulse 97  Wt 116 lb (52.617 kg)  SpO2 97%  BP Readings from Last 3 Encounters:  12/25/15 130/90  09/28/15 120/80  05/15/15 130/78    Wt Readings from Last 3 Encounters:  12/25/15 116 lb (52.617 kg)  09/28/15 121 lb (54.885 kg)  05/15/15 126 lb (57.153 kg)    Physical Exam  Constitutional: She appears well-developed. No distress.  HENT:  Head: Normocephalic.  Right Ear: External ear normal.  Left Ear: External ear normal.  Nose: Nose normal.  Mouth/Throat: Oropharynx is clear and moist.  Eyes: Conjunctivae are normal. Pupils are equal, round, and reactive to light. Right eye exhibits no discharge. Left eye exhibits no discharge.  Neck: Normal range of motion. Neck supple. No JVD present. No tracheal deviation present. No thyromegaly present.  Cardiovascular: Normal rate, regular rhythm and normal heart sounds.   Pulmonary/Chest: No stridor. No respiratory distress. She has no wheezes.  Abdominal: Soft. Bowel sounds are normal. She exhibits no  distension and no mass. There is no tenderness. There is no rebound and no guarding.  Musculoskeletal: She exhibits no edema or tenderness.  Lymphadenopathy:    She has no cervical adenopathy.  Neurological: She displays normal reflexes. No cranial nerve deficit. She exhibits normal muscle tone. Coordination abnormal.  Skin: No rash noted. No erythema.  Psychiatric: She has a normal mood and affect. Her behavior is normal. Judgment  and thought content normal.    Lab Results  Component Value Date   WBC 6.3 08/22/2013   HGB 13.1 08/22/2013   HCT 37.7 08/22/2013   PLT 176.0 08/22/2013   GLUCOSE 94 09/28/2015   CHOL 178 01/08/2013   TRIG 226* 01/08/2013   HDL 58 01/08/2013   LDLDIRECT 125.9 10/06/2007   LDLCALC 75 01/08/2013   ALT 25 09/28/2015   AST 40* 09/28/2015   NA 130* 09/28/2015   K 3.7 09/28/2015   CL 94* 09/28/2015   CREATININE 1.07 09/28/2015   BUN 16 09/28/2015   CO2 28 09/28/2015   TSH 0.64 09/28/2015   INR 1.2* 08/22/2013   HGBA1C 5.3 01/08/2013    No results found.  Assessment & Plan:   There are no diagnoses linked to this encounter. I have discontinued Ms. Kessinger's predniSONE. I am also having her maintain her cholecalciferol, meclizine, levothyroxine, pantoprazole, ALPRAZolam, donepezil, escitalopram, clopidogrel, azaTHIOprine, losartan, and Fexofenadine-Pseudoephedrine (ALLEGRA-D 24 HOUR PO).  Meds ordered this encounter  Medications  . Fexofenadine-Pseudoephedrine (ALLEGRA-D 24 HOUR PO)    Sig: Take by mouth as needed.     Follow-up: No Follow-up on file.  Walker Kehr, MD

## 2015-12-25 NOTE — Assessment & Plan Note (Signed)
Lexapro Xanax prn  Potential benefits of a long term benzodiazepines  use as well as potential risks  and complications were explained to the patient and were aknowledged. 

## 2015-12-25 NOTE — Assessment & Plan Note (Signed)
Doing fair 

## 2015-12-25 NOTE — Assessment & Plan Note (Signed)
Wt Readings from Last 3 Encounters:  12/25/15 116 lb (52.617 kg)  09/28/15 121 lb (54.885 kg)  05/15/15 126 lb (57.153 kg)

## 2015-12-25 NOTE — Assessment & Plan Note (Signed)
On Losartan 

## 2015-12-25 NOTE — Assessment & Plan Note (Addendum)
  On Plavix and Losartan No relapse Join [employer]ne[employer] or [employer] if you can [facility])

## 2016-01-30 ENCOUNTER — Other Ambulatory Visit: Payer: Self-pay | Admitting: *Deleted

## 2016-01-30 MED ORDER — LEVOTHYROXINE SODIUM 88 MCG PO TABS: 88.0000 ug | ORAL_TABLET | Freq: Every day | ORAL | Status: DC

## 2016-02-03 ENCOUNTER — Emergency Department (HOSPITAL_COMMUNITY): Payer: Medicare Other

## 2016-02-03 ENCOUNTER — Inpatient Hospital Stay (HOSPITAL_COMMUNITY)
Admission: EM | Admit: 2016-02-03 | Discharge: 2016-02-08 | DRG: 309 | Disposition: A | Discharge status: Skilled Nursing Facility | Payer: Medicare Other | Attending: Internal Medicine | Admitting: Internal Medicine

## 2016-02-03 ENCOUNTER — Encounter (HOSPITAL_COMMUNITY): Payer: Self-pay | Admitting: *Deleted

## 2016-02-03 DIAGNOSIS — Z888 Allergy status to other drugs, medicaments and biological substances status: Secondary | ICD-10-CM

## 2016-02-03 DIAGNOSIS — Z886 Allergy status to analgesic agent status: Secondary | ICD-10-CM

## 2016-02-03 DIAGNOSIS — Z8249 Family history of ischemic heart disease and other diseases of the circulatory system: Secondary | ICD-10-CM

## 2016-02-03 DIAGNOSIS — Z880 Allergy status to penicillin: Secondary | ICD-10-CM

## 2016-02-03 DIAGNOSIS — I10 Essential (primary) hypertension: Secondary | ICD-10-CM | POA: Diagnosis not present

## 2016-02-03 DIAGNOSIS — M797 Fibromyalgia: Secondary | ICD-10-CM | POA: Diagnosis present

## 2016-02-03 DIAGNOSIS — Z90710 Acquired absence of both cervix and uterus: Secondary | ICD-10-CM

## 2016-02-03 DIAGNOSIS — D649 Anemia, unspecified: Secondary | ICD-10-CM | POA: Diagnosis present

## 2016-02-03 DIAGNOSIS — K754 Autoimmune hepatitis: Secondary | ICD-10-CM | POA: Diagnosis present

## 2016-02-03 DIAGNOSIS — J90 Pleural effusion, not elsewhere classified: Secondary | ICD-10-CM | POA: Diagnosis present

## 2016-02-03 DIAGNOSIS — Z79899 Other long term (current) drug therapy: Secondary | ICD-10-CM

## 2016-02-03 DIAGNOSIS — R42 Dizziness and giddiness: Secondary | ICD-10-CM

## 2016-02-03 DIAGNOSIS — E039 Hypothyroidism, unspecified: Secondary | ICD-10-CM | POA: Diagnosis present

## 2016-02-03 DIAGNOSIS — R069 Unspecified abnormalities of breathing: Secondary | ICD-10-CM | POA: Diagnosis not present

## 2016-02-03 DIAGNOSIS — M316 Other giant cell arteritis: Secondary | ICD-10-CM | POA: Diagnosis not present

## 2016-02-03 DIAGNOSIS — E785 Hyperlipidemia, unspecified: Secondary | ICD-10-CM | POA: Diagnosis present

## 2016-02-03 DIAGNOSIS — I4891 Unspecified atrial fibrillation: Principal | ICD-10-CM

## 2016-02-03 DIAGNOSIS — I82403 Acute embolism and thrombosis of unspecified deep veins of lower extremity, bilateral: Secondary | ICD-10-CM

## 2016-02-03 DIAGNOSIS — W19XXXA Unspecified fall, initial encounter: Secondary | ICD-10-CM | POA: Diagnosis present

## 2016-02-03 DIAGNOSIS — K219 Gastro-esophageal reflux disease without esophagitis: Secondary | ICD-10-CM | POA: Diagnosis present

## 2016-02-03 DIAGNOSIS — R0600 Dyspnea, unspecified: Secondary | ICD-10-CM | POA: Diagnosis present

## 2016-02-03 DIAGNOSIS — Z7901 Long term (current) use of anticoagulants: Secondary | ICD-10-CM

## 2016-02-03 DIAGNOSIS — Z885 Allergy status to narcotic agent status: Secondary | ICD-10-CM

## 2016-02-03 DIAGNOSIS — Z853 Personal history of malignant neoplasm of breast: Secondary | ICD-10-CM

## 2016-02-03 DIAGNOSIS — Z7902 Long term (current) use of antithrombotics/antiplatelets: Secondary | ICD-10-CM

## 2016-02-03 DIAGNOSIS — E876 Hypokalemia: Secondary | ICD-10-CM | POA: Diagnosis present

## 2016-02-03 DIAGNOSIS — M353 Polymyalgia rheumatica: Secondary | ICD-10-CM | POA: Diagnosis present

## 2016-02-03 DIAGNOSIS — F329 Major depressive disorder, single episode, unspecified: Secondary | ICD-10-CM | POA: Diagnosis present

## 2016-02-03 DIAGNOSIS — Z8673 Personal history of transient ischemic attack (TIA), and cerebral infarction without residual deficits: Secondary | ICD-10-CM

## 2016-02-03 DIAGNOSIS — R9431 Abnormal electrocardiogram [ECG] [EKG]: Secondary | ICD-10-CM | POA: Diagnosis present

## 2016-02-03 LAB — MAGNESIUM: Magnesium: 1.8 mg/dL (ref 1.7–2.4)

## 2016-02-03 LAB — CBC
HCT: 36.7 % (ref 36.0–46.0)
Hemoglobin: 12.3 g/dL (ref 12.0–15.0)
MCH: 29.1 pg (ref 26.0–34.0)
MCHC: 33.5 g/dL (ref 30.0–36.0)
MCV: 86.8 fL (ref 78.0–100.0)
PLATELETS: 164 10*3/uL (ref 150–400)
RBC: 4.23 MIL/uL (ref 3.87–5.11)
RDW: 13.9 % (ref 11.5–15.5)
WBC: 9 10*3/uL (ref 4.0–10.5)

## 2016-02-03 LAB — BRAIN NATRIURETIC PEPTIDE: B Natriuretic Peptide: 305.3 pg/mL — ABNORMAL HIGH (ref 0.0–100.0)

## 2016-02-03 LAB — BASIC METABOLIC PANEL
ANION GAP: 8 (ref 5–15)
BUN: 8 mg/dL (ref 6–20)
CALCIUM: 9.2 mg/dL (ref 8.9–10.3)
CO2: 21 mmol/L — AB (ref 22–32)
CREATININE: 0.94 mg/dL (ref 0.44–1.00)
Chloride: 105 mmol/L (ref 101–111)
GFR calc non Af Amer: 55 mL/min — ABNORMAL LOW (ref >60)
GLUCOSE: 99 mg/dL (ref 65–99)
POTASSIUM: 4 mmol/L (ref 3.5–5.1)
SODIUM: 134 mmol/L — AB (ref 135–145)

## 2016-02-03 LAB — D-DIMER, QUANTITATIVE: D-Dimer, Quant: 1.19 ug/mL-FEU — ABNORMAL HIGH (ref 0.00–0.50)

## 2016-02-03 LAB — TSH: TSH: 0.15 u[IU]/mL — ABNORMAL LOW (ref 0.350–4.500)

## 2016-02-03 LAB — I-STAT TROPONIN, ED: TROPONIN I, POC: 0.01 ng/mL (ref 0.00–0.08)

## 2016-02-03 LAB — MRSA PCR SCREENING: MRSA BY PCR: NEGATIVE

## 2016-02-03 MED ORDER — AZATHIOPRINE 50 MG PO TABS
50.0000 mg | ORAL_TABLET | Freq: Every day | ORAL | Status: DC
Start: 2016-02-03 — End: 2016-02-08
  Administered 2016-02-03 – 2016-02-08 (×6): 50 mg via ORAL
  Filled 2016-02-03 (×6): qty 1

## 2016-02-03 MED ORDER — CLOPIDOGREL BISULFATE 75 MG PO TABS
75.0000 mg | ORAL_TABLET | Freq: Every morning | ORAL | Status: DC
  Administered 2016-02-04 – 2016-02-05 (×2): 75 mg via ORAL
  Filled 2016-02-03 (×2): qty 1

## 2016-02-03 MED ORDER — MECLIZINE HCL 25 MG PO TABS
12.5000 mg | ORAL_TABLET | Freq: Two times a day (BID) | ORAL | Status: DC | PRN
  Filled 2016-02-03: qty 1

## 2016-02-03 MED ORDER — DILTIAZEM HCL 100 MG IV SOLR: 5.0000 mg/h | Freq: Once | INTRAVENOUS | Status: DC

## 2016-02-03 MED ORDER — ACETAMINOPHEN 325 MG PO TABS
650.0000 mg | ORAL_TABLET | Freq: Once | ORAL | Status: DC
  Filled 2016-02-03: qty 2

## 2016-02-03 MED ORDER — ASPIRIN 81 MG PO CHEW
324.0000 mg | CHEWABLE_TABLET | Freq: Once | ORAL | Status: AC
  Administered 2016-02-03: 324 mg via ORAL
  Filled 2016-02-03: qty 4

## 2016-02-03 MED ORDER — DILTIAZEM LOAD VIA INFUSION
10.0000 mg | Freq: Once | INTRAVENOUS | Status: AC
  Administered 2016-02-03: 10 mg via INTRAVENOUS
  Filled 2016-02-03: qty 10

## 2016-02-03 MED ORDER — ALPRAZOLAM 0.25 MG PO TABS
0.2500 mg | ORAL_TABLET | Freq: Two times a day (BID) | ORAL | Status: DC | PRN
  Administered 2016-02-06: 0.25 mg via ORAL
  Filled 2016-02-03: qty 1

## 2016-02-03 MED ORDER — LOSARTAN POTASSIUM 50 MG PO TABS
100.0000 mg | ORAL_TABLET | Freq: Every day | ORAL | Status: DC
  Administered 2016-02-03 – 2016-02-04 (×2): 100 mg via ORAL
  Filled 2016-02-03 (×2): qty 2

## 2016-02-03 MED ORDER — DEXTROSE 5 % IV SOLN: 5.0000 mg/h | Freq: Once | INTRAVENOUS | Status: DC

## 2016-02-03 MED ORDER — LEVOTHYROXINE SODIUM 88 MCG PO TABS
88.0000 ug | ORAL_TABLET | Freq: Every day | ORAL | Status: DC
  Administered 2016-02-04: 88 ug via ORAL
  Filled 2016-02-03: qty 1

## 2016-02-03 MED ORDER — RIVAROXABAN 15 MG PO TABS
15.0000 mg | ORAL_TABLET | Freq: Every day | ORAL | Status: DC
  Administered 2016-02-03 – 2016-02-07 (×5): 15 mg via ORAL
  Filled 2016-02-03 (×6): qty 1

## 2016-02-03 MED ORDER — DILTIAZEM HCL 100 MG IV SOLR
5.0000 mg/h | INTRAVENOUS | Status: DC
  Administered 2016-02-03: 5 mg/h via INTRAVENOUS
  Administered 2016-02-03 – 2016-02-04 (×2): 15 mg/h via INTRAVENOUS
  Filled 2016-02-03 (×3): qty 100

## 2016-02-03 NOTE — ED Notes (Signed)
Pt in from home via [facility], per report pt called EMS d/t SOB, upon arrival pt found to be in a fib with rate of 180-220,  Pt takes Plavix, pt rcvd x 2 10 mg Cardiazem boluses with Cardiazem drip 5 mg /hr with HR of 78 upon arrival to ED with NSR upon arrival to ED, pt denies n/v/d, pt denies CP, A&O 4

## 2016-02-03 NOTE — Consult Note (Signed)
Primary cardiologist: Dr Kirk Ruths Consulting cardiologist: Dr Carlyle Dolly Requesting provider: NP Tye Savoy Indication: Afib  Clinical Summary Michele Diaz is a 82 y.o.female with history of chest pain with cath 2002 with patent coronaries, asthma, HTN, HL admitted with DOE and palpitations. From EMS she was noted to be in afib with RVR, given 10mg  of IV dilt and started on drip. She reports intermittent palpitations for the last several weeks.   BNP 305, Hgb 12.3, Plt 164, K 4, Cr 0.94, trop 0.01 CXR right sided effusion with possible atelectasis 12/2012 echo: LVEF 60%, no WMAs    Allergies  Allergen Reactions  . Celecoxib Shortness Of Breath    redness  . Codeine Nausea Only  . Atorvastatin     Elevated liver enzymes  . Rosuvastatin     Elevated liver enzymes  . Penicillins Hives    Medications Scheduled Medications: . acetaminophen  650 mg Oral Once  . azaTHIOprine  50 mg Oral Daily  . [START ON 02/04/2016] clopidogrel  75 mg Oral q morning - 10a  . [START ON 02/04/2016] levothyroxine  88 mcg Oral QAC breakfast  . losartan  100 mg Oral Daily  . Rivaroxaban  15 mg Oral Q supper     Infusions: . diltiazem (CARDIZEM) infusion 7.5 mg/hr (02/03/16 1642)     PRN Medications:  ALPRAZolam, meclizine   Past Medical History  Diagnosis Date  . Hypothyroid   . Asthma   . Hypertension   . GERD (gastroesophageal reflux disease)   . Depression   . Unspecified vitamin D deficiency   . Allergic rhinitis   . Diverticulitis of colon   . Hepatitis, autoimmune ([place])   . Herpes zoster 2011  . Weight loss 6/11  . Hyperlipidemia     statins increased LFTs  . PONV (postoperative nausea and vomiting)     "years ago" (01/07/2013)  . Giant cell arteritis ([place]) 2003    devashwar  . Temporal arteritis ([place])   . Daily headache     "related to temporal arteritis" (01/07/2013)  . Arthritis     "hips, hands, feet; pretty much feels like all over" (01/07/2013)  .  Fibromyalgia   . Chronic lower back pain   . Anxiety   . Breast cancer ([place]) ~ 2008    "right" (01/07/2013)  . Stroke [facility])     Past Surgical History  Procedure Laterality Date  . Appendectomy    . Cholecystectomy    . Tonsillectomy    . Abdominal hysterectomy  1988  . Dilation and curettage of uterus    . Breast biopsy Right   . Breast lumpectomy Right     Family History  Problem Relation Age of Onset  . Diabetes Mother   . Cancer Brother   . Heart disease Brother   . Cancer Sister   . Heart disease Sister     MI at age 32  . Hypertension Other   . Heart attack Mother     died of MI at age 9    Social History Michele Diaz reports that she has never smoked. She has never used smokeless tobacco. Michele Diaz reports that she does not drink alcohol.  Review of Systems CONSTITUTIONAL: No weight loss, fever, chills, weakness or fatigue.  HEENT: Eyes: No visual loss, blurred vision, double vision or yellow sclerae. No hearing loss, sneezing, congestion, runny nose or sore throat.  SKIN: No rash or itching.  CARDIOVASCULAR: per HPI RESPIRATORY: +DOE GASTROINTESTINAL: No anorexia,  nausea, vomiting or diarrhea. No abdominal pain or blood.  GENITOURINARY: no polyuria, no dysuria NEUROLOGICAL: No headache, dizziness, syncope, paralysis, ataxia, numbness or tingling in the extremities. No change in bowel or bladder control.  MUSCULOSKELETAL: No muscle, back pain, joint pain or stiffness.  HEMATOLOGIC: No anemia, bleeding or bruising.  LYMPHATICS: No enlarged nodes. No history of splenectomy.  PSYCHIATRIC: No history of depression or anxiety.      Physical Examination Blood pressure 122/83, pulse 109, temperature 98.6 F (37 C), temperature source Oral, resp. rate 23, SpO2 93 %. No intake or output data in the 24 hours ending 02/03/16 1708  HEENT: sclera clear, throat clear  Cardiovascular: irreg, rate 115, no m/r/g  Respiratory: CTAB  GI: abdomen soft, NT, ND  MSK: no  Le edema  Neuro: no focal deficits  Psych: appropriate affect   Lab Results  Basic Metabolic Panel:  Recent Labs Lab 02/03/16 1306  NA 134*  K 4.0  CL 105  CO2 21*  GLUCOSE 99  BUN 8  CREATININE 0.94  CALCIUM 9.2    Liver Function Tests: No results for input(s): AST, ALT, ALKPHOS, BILITOT, PROT, ALBUMIN in the last 168 hours.  CBC:  Recent Labs Lab 02/03/16 1306  WBC 9.0  HGB 12.3  HCT 36.7  MCV 86.8  PLT 164    Cardiac Enzymes: No results for input(s): CKTOTAL, CKMB, CKMBINDEX, TROPONINI in the last 168 hours.  BNP: Invalid input(s): POCBNP     Impression/Recommendations 1. New diagnosis of afib - palpitaitons on and off for several weeks, found to be in afib with RVR - rate controlled with dilt gtt. Plan to convert to oral dilt tomorrow - please obtain echo.  - CHADS2Vasc score is 4, she has been started on xarelto 15mg  daily. Follow renal function, if her GFR is stable above 50 recommended dose is 20mg  daily.  - ER EKG reports severe QTc prolongation, by manual measure I get 474ms which is reasonable for her     Carlyle Dolly, M.D

## 2016-02-03 NOTE — ED Provider Notes (Signed)
CSN: [id number]     Arrival date & time 02/03/16  1231 History   First MD Initiated Contact with Patient 02/03/16 1241     Chief Complaint  Patient presents with  . Irregular Heart Beat   dyspnea HPI 83 year old female history of hypertension presents today complaining of dyspnea at night for the past week. She states that she has not been sleeping well and that her chest shakes at night. She denies chest pain but states she has had left shoulder pain the past 2 nights. She describes it as sharp and severe. She has had no trauma to this area does not increase with movement or palpation. Dyspnea has been worse when she lays flat at night but she has not noted any dyspnea on exertion. She has been doing her usual daily activities which include going outside spraying. She has not had similar symptoms in the past. She denies any cardiac disease or heart failure, COPD, asthma, swelling, increased fluid, DVT, PE, fever, or productive cough.  Prehospital EMS noted her to be in A. fib with a rate of 180s to 100 and she received 10 mg Cardizem IV followed by a drip at 5 mg per hour. Cardizem drip discontinued by RN prior to my evaluation.  Family reports that she has a history of A. fib in the past, but I am unable to find documentation her chart and she has last EKG with normal sinus rhythm Past Medical History  Diagnosis Date  . Hypothyroid   . Asthma   . Hypertension   . GERD (gastroesophageal reflux disease)   . Depression   . Unspecified vitamin D deficiency   . Allergic rhinitis   . Diverticulitis of colon   . Hepatitis, autoimmune ([place])   . Herpes zoster 2011  . Weight loss 6/11  . Hyperlipidemia     statins increased LFTs  . PONV (postoperative nausea and vomiting)     "years ago" (01/07/2013)  . Giant cell arteritis ([place]) 2003    devashwar  . Temporal arteritis ([place])   . Daily headache     "related to temporal arteritis" (01/07/2013)  . Arthritis     "hips, hands, feet; pretty much  feels like all over" (01/07/2013)  . Fibromyalgia   . Chronic lower back pain   . Anxiety   . Breast cancer ([place]) ~ 2008    "right" (01/07/2013)  . Stroke [facility])    Past Surgical History  Procedure Laterality Date  . Appendectomy    . Cholecystectomy    . Tonsillectomy    . Abdominal hysterectomy  1988  . Dilation and curettage of uterus    . Breast biopsy Right   . Breast lumpectomy Right    Family History  Problem Relation Age of Onset  . Diabetes Mother   . Cancer Brother   . Heart disease Brother   . Cancer Sister   . Heart disease Sister     MI at age 28  . Hypertension Other   . Heart attack Mother     died of MI at age 39   Social History  Substance Use Topics  . Smoking status: Never Smoker   . Smokeless tobacco: Never Used  . Alcohol Use: No   OB History    No data available     Review of Systems  Constitutional: Negative.   HENT: Negative.   Eyes: Negative.   Respiratory: Positive for shortness of breath.   Cardiovascular: Positive for palpitations.  Gastrointestinal: Negative.  Endocrine: Negative.   Genitourinary: Negative.   Musculoskeletal: Negative.   Skin: Negative.   Allergic/Immunologic: Negative.   Neurological: Negative.   Hematological: Negative.   Psychiatric/Behavioral: Negative.       Allergies  Celecoxib; Codeine; Atorvastatin; Rosuvastatin; and Penicillins  Home Medications   Prior to Admission medications   Medication Sig Start Date End Date Taking? Authorizing Provider  ALPRAZolam Duanne Moron) 0.5 MG tablet Take 0.5-1 tablets (0.25-0.5 mg total) by mouth 2 (two) times daily as needed for anxiety or sleep. 02/13/15 02/13/16 Yes Aleksei [name] V, MD  azaTHIOprine (IMURAN) 50 MG tablet Take 1 tablet (50 mg total) by mouth daily. 09/28/15  Yes Aleksei [name] V, MD  cholecalciferol (VITAMIN D) 1000 UNITS tablet Take 1 tablet (1,000 Units total) by mouth daily. 01/14/13  Yes Daniel J Angiulli, PA-C  clopidogrel (PLAVIX) 75 MG tablet  TAKE 1 TABLET BY MOUTH EVERY MORNING 07/30/15  Yes Aleksei [name] V, MD  fexofenadine (ALLEGRA) 180 MG tablet Take 1 tablet (180 mg total) by mouth daily. 12/25/15  Yes Aleksei [name] V, MD  levothyroxine (SYNTHROID, LEVOTHROID) 88 MCG tablet Take 1 tablet (88 mcg total) by mouth daily. 01/30/16  Yes Aleksei [name] V, MD  losartan (COZAAR) 100 MG tablet Take 1 tablet (100 mg total) by mouth daily. 09/28/15  Yes Aleksei [name] V, MD  meclizine (ANTIVERT) 12.5 MG tablet Take 1 tablet (12.5 mg total) by mouth 2 (two) times daily. Patient taking differently: Take 12.5 mg by mouth 2 (two) times daily as needed for dizziness.  04/22/13  Yes Aleksei [name] V, MD  pantoprazole (PROTONIX) 40 MG tablet Take 1 tablet (40 mg total) by mouth daily. 01/04/15  Yes Aleksei [name] V, MD  triamcinolone cream (KENALOG) 0.1 % Apply 1 application topically 3 (three) times daily. Use on rash Patient taking differently: Apply 1 application topically 4 (four) times daily. Use on rash 12/25/15  Yes Aleksei [name] V, MD  donepezil (ARICEPT) 5 MG tablet Take 1 tablet (5 mg total) by mouth daily. Patient not taking: Reported on 12/25/2015 05/15/15   Aleksei [name] V, MD  escitalopram (LEXAPRO) 10 MG tablet TAKE 1 TABLET BY MOUTH DAILY Patient not taking: Reported on 02/03/2016 07/18/15   Aleksei [name] V, MD   BP 103/57 mmHg  Pulse 76  Temp(Src) 98.6 F (37 C) (Oral)  Resp 22  SpO2 92% Physical Exam  Constitutional: She is oriented to person, place, and time. She appears well-developed and well-nourished.  HENT:  Head: Normocephalic and atraumatic.  Right Ear: External ear normal.  Left Ear: External ear normal.  Nose: Nose normal.  Mouth/Throat: Oropharynx is clear and moist.  Eyes: Conjunctivae and EOM are normal. Pupils are equal, round, and reactive to light.  Neck: Normal range of motion. Neck supple.  Cardiovascular: Normal heart sounds and intact distal pulses.  An irregularly irregular  rhythm present.  Pulmonary/Chest: Effort normal and breath sounds normal.  Abdominal: Soft. Bowel sounds are normal.  Musculoskeletal: Normal range of motion.  Neurological: She is alert and oriented to person, place, and time. She has normal reflexes.  Skin: Skin is warm and dry.  Psychiatric: She has a normal mood and affect. Her behavior is normal. Judgment and thought content normal.  Nursing note and vitals reviewed.   ED Course  Procedures (including critical care time) Labs Review Labs Reviewed  BASIC METABOLIC PANEL - Abnormal; Notable for the following:    Sodium 134 (*)    CO2 21 (*)    GFR calc non  Af Amer 55 (*)    All other components within normal limits  BRAIN NATRIURETIC PEPTIDE - Abnormal; Notable for the following:    B Natriuretic Peptide 305.3 (*)    All other components within normal limits  CBC  I-STAT TROPOININ, ED    Imaging Review Dg Chest 2 View  02/03/2016  CLINICAL DATA:  Shortness of breath. EXAM: CHEST  2 VIEW COMPARISON:  January 08, 2013 FINDINGS: Cardiomegaly. Increased haziness over the right base consistent with a layering effusion. There is associated opacity which could be atelectasis. No overt edema. No other acute abnormalities. IMPRESSION: New right layering effusion with associated opacity, possibly atelectasis. Cardiomegaly with no overt edema. Electronically Signed   By: Dorise Bullion III M.D   On: 02/03/2016 14:21   I have personally reviewed and evaluated these images and lab results as part of my medical decision-making.   EKG Interpretation   Date/Time:  Sunday Feb 03 2016 12:41:00 EDT Ventricular Rate:  77 PR Interval:    QRS Duration: 88 QT Interval:  572 QTC Calculation: 647 R Axis:   -16 Text Interpretation:  new onset afib with rate controlled Confirmed by Zurii Hewes  MD, Andee Poles [id number]) on 02/03/2016 1:02:01 PM      MDM   Final diagnoses:  New onset a-fib ([place])    82 y.o. Female new onset a fib, dyspnea, shoulder pain  with negative troponin, slightly elevated bnp.  Patient Rate controlled on Cardizem. Unclear onset.  Plan admission for ongoing rate control and evaluation of chest pain   This patients CHA2DS2-VASc Score and unadjusted Ischemic Stroke Rate (% per year) is equal to 9.7 % stroke rate/year from a score of 6  Above score calculated as 1 point each if present [CHF, HTN, DM, Vascular=MI/PAD/Aortic Plaque, Age if 65-74, or Female] Above score calculated as 2 points each if present [Age > 75, or Stroke/TIA/TE]      Pattricia Boss, MD 02/03/16 [phone]

## 2016-02-03 NOTE — ED Notes (Signed)
Patient transported to X-ray 

## 2016-02-03 NOTE — H&P (Signed)
History and Physical    Michele Diaz [id number] DOB: 03/11/1932 DOA: 02/03/2016  PCP: Walker Kehr, MD  Patient coming from:  Home   Chief Complaint:  Dyspnea and palpitations  HPI: Michele Diaz is a 82 y.o. female with medical history significant for, but not necessarily limited to, HTN, CVA, polymyalgia rheumatica , breast cancer, and hypothyroidism. Patient brought to ED by EMS. She gives a several week history of fatigue and poor appetite. She been having intermittent fluttering in chest over the last several days and orthopnea for the last two days. At night she feels like her chest and shoulders are "jumping" and feels like there is fluid in her throat which has led to some coughing. For the dyspnea she tried Vicks vapor rub. Last night she was nauseated and also had terrible left shoulder pain.  Approximately three months ago patient fell at home for unclear reasons, she did not trip on anything and cannot recall if there was loss of consciousness but there was no preceding dizziness.   Dyspnea mainly when laying flat but patient was SOB today. EMS called, found patient's heart rate in 180s  She received 10mg  of IV Cardizem  Followed by 5 mg/hur drip. RN in ED discontinued gtt but it had to be restarted for recurrent afib with RVR. Patient thinks someone in the hospital years ago mentioned that she could have Afib.   ED Course:   Cardizem drip started by EMS was discontinued in the emergency department when heart rate normalized. Shortly afterwards patient developed recurrent A. fib with RVR, drip restarted.  Xarelto ordered, first dose tonight.  ASA 324mg  given  Review of Systems:  Review of Systems  Constitutional: Positive for malaise/fatigue.  HENT: Negative.   Eyes: Negative.   Respiratory: Positive for cough.   Cardiovascular: Positive for orthopnea.  Gastrointestinal: Positive for nausea.  Genitourinary: Negative.   Musculoskeletal: Positive for joint pain and  falls.  Skin: Negative.   Neurological: Negative.   Endo/Heme/Allergies: Negative.   Psychiatric/Behavioral: Negative.      Past Medical History  Diagnosis Date  . Hypothyroid   . Asthma   . Hypertension   . GERD (gastroesophageal reflux disease)   . Depression   . Unspecified vitamin D deficiency   . Allergic rhinitis   . Diverticulitis of colon   . Hepatitis, autoimmune ([place])   . Herpes zoster 2011  . Weight loss 6/11  . Hyperlipidemia     statins increased LFTs  . PONV (postoperative nausea and vomiting)     "years ago" (01/07/2013)  . Giant cell arteritis ([place]) 2003    devashwar  . Temporal arteritis ([place])   . Daily headache     "related to temporal arteritis" (01/07/2013)  . Arthritis     "hips, hands, feet; pretty much feels like all over" (01/07/2013)  . Fibromyalgia   . Chronic lower back pain   . Anxiety   . Breast cancer ([place]) ~ 2008    "right" (01/07/2013)  . Stroke [facility])     Past Surgical History  Procedure Laterality Date  . Appendectomy    . Cholecystectomy    . Tonsillectomy    . Abdominal hysterectomy  1988  . Dilation and curettage of uterus    . Breast biopsy Right   . Breast lumpectomy Right    Lives alone at home. Uses a cane for ambulation  reports that she has never smoked. She has never used smokeless tobacco. She reports that she does not  drink alcohol or use illicit drugs.  Allergies  Allergen Reactions  . Celecoxib Shortness Of Breath    redness  . Codeine Nausea Only  . Atorvastatin     Elevated liver enzymes  . Rosuvastatin     Elevated liver enzymes  . Penicillins Hives    Family History  Problem Relation Age of Onset  . Diabetes Mother   . Cancer Brother   . Heart disease Brother   . Cancer Sister   . Heart disease Sister     MI at age 68  . Hypertension Other   . Heart attack Mother     died of MI at age 79   . Prior to Admission medications   Medication Sig Start Date End Date Taking? Authorizing Provider    ALPRAZolam Duanne Moron) 0.5 MG tablet Take 0.5-1 tablets (0.25-0.5 mg total) by mouth 2 (two) times daily as needed for anxiety or sleep. 02/13/15 02/13/16 Yes Aleksei [name] V, MD  azaTHIOprine (IMURAN) 50 MG tablet Take 1 tablet (50 mg total) by mouth daily. 09/28/15  Yes Aleksei [name] V, MD  cholecalciferol (VITAMIN D) 1000 UNITS tablet Take 1 tablet (1,000 Units total) by mouth daily. 01/14/13  Yes Daniel J Angiulli, PA-C  clopidogrel (PLAVIX) 75 MG tablet TAKE 1 TABLET BY MOUTH EVERY MORNING 07/30/15  Yes Aleksei [name] V, MD  fexofenadine (ALLEGRA) 180 MG tablet Take 1 tablet (180 mg total) by mouth daily. 12/25/15  Yes Aleksei [name] V, MD  levothyroxine (SYNTHROID, LEVOTHROID) 88 MCG tablet Take 1 tablet (88 mcg total) by mouth daily. 01/30/16  Yes Aleksei [name] V, MD  losartan (COZAAR) 100 MG tablet Take 1 tablet (100 mg total) by mouth daily. 09/28/15  Yes Aleksei [name] V, MD  meclizine (ANTIVERT) 12.5 MG tablet Take 1 tablet (12.5 mg total) by mouth 2 (two) times daily. Patient taking differently: Take 12.5 mg by mouth 2 (two) times daily as needed for dizziness.  04/22/13  Yes Aleksei [name] V, MD  triamcinolone cream (KENALOG) 0.1 % Apply 1 application topically 3 (three) times daily. Use on rash Patient taking differently: Apply 1 application topically 4 (four) times daily. Use on rash 12/25/15  Yes Aleksei [name] V, MD  escitalopram (LEXAPRO) 10 MG tablet TAKE 1 TABLET BY MOUTH DAILY Patient not taking: Reported on 02/03/2016 07/18/15   Cassandria Anger, MD    Physical Exam: Filed Vitals:   02/03/16 1445 02/03/16 1545 02/03/16 1600 02/03/16 1615  BP: 117/77 113/83 114/75 122/91  Pulse: 131 48 106   Temp:      TempSrc:      Resp: 24 23 24 21   SpO2: 97% 91% 93%       Constitutional:  Pleasant, well-developed white female in NAD, calm, comfortable Filed Vitals:   02/03/16 1445 02/03/16 1545 02/03/16 1600 02/03/16 1615  BP: 117/77 113/83 114/75 122/91  Pulse:  131 48 106   Temp:      TempSrc:      Resp: 24 23 24 21   SpO2: 97% 91% 93%    Eyes: PERRL, lids and conjunctivae normal ENMT: Mucous membranes are moist. Posterior pharynx clear of any exudate or lesions.Normal dentition.  Neck: normal, supple, no masses Respiratory: Decreased breath sound RLL. No wheezing, no crackles. Normal respiratory effort. No accessory muscle use.  Cardiovascular: irregular rate and rhythm,  no murmurs / rubs / gallops. No extremity edema. 2+ pedal pulses.  Abdomen: no tenderness, no masses palpated. No hepatomegaly. Bowel sounds positive.  Musculoskeletal: no clubbing /  cyanosis. No joint deformity upper and lower extremities. Good ROM, no contractures. Normal muscle tone.  Skin: no rashes, lesions, ulcers.  Neurologic: CN 2-12 grossly intact. Sensation intact, Strength 5/5 in all 4.  Psychiatric: Normal judgment and insight. Alert and oriented x 3. Normal mood.   Labs on Admission: I have personally reviewed following labs and imaging studies  CBC:  Recent Labs Lab 02/03/16 1306  WBC 9.0  HGB 12.3  HCT 36.7  MCV 86.8  PLT 123456   Basic Metabolic Panel:  Recent Labs Lab 02/03/16 1306  NA 134*  K 4.0  CL 105  CO2 21*  GLUCOSE 99  BUN 8  CREATININE 0.94  CALCIUM 9.2   Radiological Exams on Admission: Dg Chest 2 View  02/03/2016  CLINICAL DATA:  Shortness of breath. EXAM: CHEST  2 VIEW COMPARISON:  January 08, 2013 FINDINGS: Cardiomegaly. Increased haziness over the right base consistent with a layering effusion. There is associated opacity which could be atelectasis. No overt edema. No other acute abnormalities. IMPRESSION: New right layering effusion with associated opacity, possibly atelectasis. Cardiomegaly with no overt edema. Electronically Signed   By: Dorise Bullion III M.D   On: 02/03/2016 14:21    EKG: Independently reviewed.   EKG Interpretation  Date/Time:  Sunday Feb 03 2016 12:41:00 EDT Ventricular Rate:  77 PR Interval:    QRS  Duration: 88 QT Interval:  572 QTC Calculation: 647 R Axis:   -16 Text Interpretation:  new onset afib with rate controlled Confirmed by RAY MD, Andee Poles [id number]) on 02/03/2016 1:02:01 PM       Assessment/Plan   Atrial Fibrillation with RVR, new onset. Chadsvasc 6-7 -Place in OBS - Stepdown -Cardiology consult placed -Continue Cardizem gtt, rate now down to 112 -Xarelto initiated by EDP, first dose this evening.   Dyspnea, secondary to afib with RVR? Also new right layering effusion with associated opacity, possibly atelectasis. -obtain echo -monitor 02 sats, 02 per [place] prn  Prolonged Qtc of 647 -check Mg+ level, TSH -K+ is normal.   -avoid QT prolonging medications  Polymyalgia Rheumatica -continue home Imuran  Hypertension, controlled in ED -Continue ACEI  Hx of CVA, on Plavix -Continue home Plavix  Hypothyroidism. Normal TSH mid January -TSH pending -Continue home Synthroid  Vertigo -continue home Antivert  Fall -fell at home 3 months ago. No preceding dizziness, did not trip, not clear if lost consciousness. No falls since. Lives a lone. Uses cane -PT evaluation  Hx of Anxiety.  -continue home xanax prn  GERD, asymptomatic.Patient states she is NOT on anything for GERD   Active Problems:   Hypothyroidism   Essential hypertension   GERD   Polymyalgia rheumatica (HCC)   VERTIGO   BREAST CANCER, HX OF   Prolonged Q-T interval on ECG   DVT prophylaxis:   SCDs at least until tomorrow - should be starting Xarelto this pm Code Status:    DNR Family Communication:  Daughter and son in room. Discussed plan of treatment and they understand and agree.  Disposition Plan: Discharge home in 24-48 hours              Consults called:    Cardiology - Fellow paged  Admission status:  Observation - Stepdown  Tye Savoy NP [facility] Pager [phone]  If 7PM-7AM, please contact night-coverage [email or web address] Password [email or web address]  02/03/2016, 4:30 PM

## 2016-02-03 NOTE — Plan of Care (Signed)
Problem: Pain Managment: Goal: General experience of comfort will improve Outcome: Progressing Patent has complained of no chest pain at this time

## 2016-02-04 ENCOUNTER — Observation Stay (HOSPITAL_COMMUNITY): Payer: Medicare Other

## 2016-02-04 ENCOUNTER — Observation Stay (HOSPITAL_BASED_OUTPATIENT_CLINIC_OR_DEPARTMENT_OTHER): Payer: Medicare Other

## 2016-02-04 DIAGNOSIS — I10 Essential (primary) hypertension: Secondary | ICD-10-CM

## 2016-02-04 DIAGNOSIS — R0600 Dyspnea, unspecified: Secondary | ICD-10-CM

## 2016-02-04 DIAGNOSIS — I4891 Unspecified atrial fibrillation: Secondary | ICD-10-CM | POA: Diagnosis not present

## 2016-02-04 LAB — CBC
HCT: 35.7 % — ABNORMAL LOW (ref 36.0–46.0)
Hemoglobin: 11.7 g/dL — ABNORMAL LOW (ref 12.0–15.0)
MCH: 28.7 pg (ref 26.0–34.0)
MCHC: 32.8 g/dL (ref 30.0–36.0)
MCV: 87.7 fL (ref 78.0–100.0)
PLATELETS: 119 10*3/uL — AB (ref 150–400)
RBC: 4.07 MIL/uL (ref 3.87–5.11)
RDW: 14 % (ref 11.5–15.5)
WBC: 9 10*3/uL (ref 4.0–10.5)

## 2016-02-04 LAB — ECHOCARDIOGRAM COMPLETE
Height: 61.5 in
WEIGHTICAEL: 1851.86 [oz_av]

## 2016-02-04 LAB — BASIC METABOLIC PANEL
Anion gap: 8 (ref 5–15)
BUN: 6 mg/dL (ref 6–20)
CALCIUM: 8.9 mg/dL (ref 8.9–10.3)
CO2: 20 mmol/L — AB (ref 22–32)
CREATININE: 0.8 mg/dL (ref 0.44–1.00)
Chloride: 104 mmol/L (ref 101–111)
GFR calc non Af Amer: >60 mL/min (ref >60)
Glucose, Bld: 112 mg/dL — ABNORMAL HIGH (ref 65–99)
Potassium: 3.8 mmol/L (ref 3.5–5.1)
SODIUM: 132 mmol/L — AB (ref 135–145)

## 2016-02-04 MED ORDER — LOSARTAN POTASSIUM 50 MG PO TABS
50.0000 mg | ORAL_TABLET | Freq: Every day | ORAL | Status: DC
  Administered 2016-02-05 – 2016-02-08 (×4): 50 mg via ORAL
  Filled 2016-02-04 (×4): qty 1

## 2016-02-04 MED ORDER — PERFLUTREN LIPID MICROSPHERE
1.0000 mL | INTRAVENOUS | Status: DC | PRN
  Administered 2016-02-04: 2 mL via INTRAVENOUS
  Filled 2016-02-04: qty 10

## 2016-02-04 MED ORDER — ACETAMINOPHEN 325 MG PO TABS: 650.0000 mg | ORAL_TABLET | Freq: Four times a day (QID) | ORAL | Status: DC | PRN

## 2016-02-04 MED ORDER — PERFLUTREN LIPID MICROSPHERE
INTRAVENOUS | Status: AC
  Filled 2016-02-04: qty 10

## 2016-02-04 MED ORDER — LEVOTHYROXINE SODIUM 75 MCG PO TABS
75.0000 ug | ORAL_TABLET | Freq: Every day | ORAL | Status: DC
  Administered 2016-02-05 – 2016-02-08 (×4): 75 ug via ORAL
  Filled 2016-02-04 (×4): qty 1

## 2016-02-04 MED ORDER — DILTIAZEM HCL ER COATED BEADS 180 MG PO CP24
360.0000 mg | ORAL_CAPSULE | Freq: Every day | ORAL | Status: DC
  Administered 2016-02-04 – 2016-02-05 (×2): 360 mg via ORAL
  Filled 2016-02-04 (×3): qty 2

## 2016-02-04 NOTE — Evaluation (Signed)
Physical Therapy Evaluation Patient Details Name: Michele Diaz MRN: [id number] DOB: 15-Jul-1932 Today's Date: 02/04/2016   History of Present Illness  Pt is a 82 y/o F brought to the ED by EMS reporting a several week h/o fatigue and poor apetitie w/ fluttering in chest and SOB.  Pt was in a-fib.  Pt's PMH includes asthma, depression, hepatitis, fibromyalgia, chronic back pain, anxiety, Rt breast lumpectomy (breast cancer), stroke.    Clinical Impression  Pt admitted with above diagnosis. Pt currently with functional limitations due to the deficits listed below (see PT Problem List). Michele Diaz reports falling ~3 months ago at home.  She currently requires min assist for safe sit>sit and min guard for short distance ambulation.  She lives alone but reports she may be able to stay with family in [place].  Recommend 24/7 supervision given pt's fall risk. If family unable to provide assist needed, recommending SNF at d/c. Pt will benefit from skilled PT to increase their independence and safety with mobility to allow discharge to the venue listed below.      Follow Up Recommendations SNF;Supervision for mobility/OOB (unless family able to provide 24/7 supervision)    Equipment Recommendations  None recommended by PT    Recommendations for Other Services OT consult     Precautions / Restrictions Precautions Precautions: Fall Precaution Comments: monitor O2 Restrictions Weight Bearing Restrictions: No      Mobility  Bed Mobility Overal bed mobility: Needs Assistance Bed Mobility: Supine to Sit     Supine to sit: Mod assist;HOB elevated     General bed mobility comments: Assist elevating trunk w/ HOB elevated and pt using bed rail  Transfers Overall transfer level: Needs assistance Equipment used: Rolling walker (2 wheeled) Transfers: Sit to/from Stand Sit to Stand: Min assist         General transfer comment: Pt slow to stand and requires assist to steady.  Cues for  hand placement and proper management of RW.  Ambulation/Gait Ambulation/Gait assistance: Min guard Ambulation Distance (Feet): 20 Feet Assistive device: Rolling walker (2 wheeled) Gait Pattern/deviations: Decreased stride length;Step-through pattern;Trunk flexed   Gait velocity interpretation: Below normal speed for age/gender General Gait Details: Flexed posture and close min guard for safety.  Cues for proper RW management.   Stairs            Wheelchair Mobility    Modified Rankin (Stroke Patients Only)       Balance Overall balance assessment: Needs assistance;History of Falls Sitting-balance support: No upper extremity supported;Feet supported Sitting balance-Leahy Scale: Fair Sitting balance - Comments: Close min guard assist for safety.  Pt sits w/ Bil UE EOB   Standing balance support: During functional activity;No upper extremity supported Standing balance-Leahy Scale: Fair Standing balance comment: Pt performs standing balance w/o UE support for pericare w/ min guard assist                             Pertinent Vitals/Pain Pain Assessment: No/denies pain    Home Living Family/patient expects to be discharged to:: Private residence Living Arrangements: Alone Available Help at Discharge: Family;Available PRN/intermittently Type of Home: Other(Comment) (Townhouse) Home Access: Stairs to enter Entrance Stairs-Rails: None Entrance Stairs-Number of Steps: 1 Home Layout: One level (storage upstairs which pt does not use) Home Equipment: Cane - quad;Cane - single point;Shower seat;Grab bars - toilet;Grab bars - tub/shower;Walker - 2 wheels;Bedside commode Additional Comments: Pt reports her son and daughter in law  might allow her to stay w/ them in [place]    Prior Function Level of Independence: Independent with assistive device(s)         Comments: Uses cane only when getting OOB at night.  Fall doing this ~3 months PTA.  Dresses and bathes  at mod I.  Sits on shower chair.     Hand Dominance        Extremity/Trunk Assessment   Upper Extremity Assessment: Generalized weakness           Lower Extremity Assessment: Generalized weakness         Communication   Communication: HOH  Cognition Arousal/Alertness: Awake/alert Behavior During Therapy: WFL for tasks assessed/performed Overall Cognitive Status: Within Functional Limits for tasks assessed                      General Comments General comments (skin integrity, edema, etc.): SpO2 remains at or above 92% on RA throughout session.    Exercises        Assessment/Plan    PT Assessment Patient needs continued PT services  PT Diagnosis Difficulty walking;Generalized weakness   PT Problem List Decreased strength;Decreased activity tolerance;Decreased balance;Decreased knowledge of use of DME;Decreased safety awareness  PT Treatment Interventions DME instruction;Gait training;Stair training;Functional mobility training;Therapeutic activities;Therapeutic exercise;Balance training;Patient/family education   PT Goals (Current goals can be found in the Care Plan section) Acute Rehab PT Goals Patient Stated Goal: to get stronger PT Goal Formulation: With patient Time For Goal Achievement: 02/18/16 Potential to Achieve Goals: Good    Frequency Min 3X/week   Barriers to discharge Inaccessible home environment;Decreased caregiver support Lives alone, has step to enter home    Co-evaluation               End of Session Equipment Utilized During Treatment: Gait belt;Oxygen Activity Tolerance: Patient tolerated treatment well Patient left: in chair;with call bell/phone within reach;with chair alarm set Nurse Communication: Mobility status;Other (comment) (SpO2)    Functional Assessment Tool Used: Clinical Judgement Functional Limitation: Mobility: Walking and moving around Mobility: Walking and Moving Around Current Status [phone]): At least  20 percent but less than 40 percent impaired, limited or restricted Mobility: Walking and Moving Around Goal Status [phone]): At least 1 percent but less than 20 percent impaired, limited or restricted    Time: [phone] (pt not charged for 10 mintues spent on commode) PT Time Calculation (min) (ACUTE ONLY): 40 min   Charges:   PT Evaluation $PT Eval Low Complexity: 1 Procedure PT Treatments $Therapeutic Activity: 8-22 mins   PT G Codes:   PT G-Codes **NOT FOR INPATIENT CLASS** Functional Assessment Tool Used: Clinical Judgement Functional Limitation: Mobility: Walking and moving around Mobility: Walking and Moving Around Current Status [id number]): At least 20 percent but less than 40 percent impaired, limited or restricted Mobility: Walking and Moving Around Goal Status [id number]): At least 1 percent but less than 20 percent impaired, limited or restricted   Collie Siad PT, DPT  Pager: [phone] Phone: [phone] 02/04/2016, 10:45 AM

## 2016-02-04 NOTE — Progress Notes (Signed)
[place] TEAM 1 - Stepdown/ICU TEAM  Michele Diaz  [id number] DOB: 09/24/1931 DOA: 02/03/2016 PCP: Walker Kehr, MD    Brief Narrative:  82 yo female with history of HTN, CVA, PMR, breast cancer, and hypothyroidism who was brought to ED by EMS w/ a several week history of fatigue and poor appetite. She had been having intermittent fluttering in chest over several days. EMS found the patient's heart rate in 180s She received 10mg  of IV Cardizem followed by 5 mg/hr drip. RN in ED discontinued gtt but it had to be restarted for recurrent afib with RVR.   Assessment & Plan:  Newly diagnosed Atrial Fibrillation with RVR -Chadsvasc score is 6 - Xarelto initiated - Cards following - transitioning to oral meds today   R layering pleural effusion with associated atx -likely simply due to RVR - f/u in AM - no sx to suggest acute infection   Elevated D-dimer TTE w/o evidence to suggest large PE - pt will be on anticoag nonetheless   Prolonged QTc (647) -Mg+ and K+ normal - TSH normal - avoid QT prolonging medications  Polymyalgia Rheumatica -continue home Imuran  Hypertension -BP well controlled  Hx of CVA -continue home Plavix  Hypothyroidism -TSH 0.15 suggesting overcorrection - reduce dose and follow - discussed w/ pt   Vertigo -continue home Antivert  Fall -fell at home 3 months ago - no preceding dizziness, did not trip, not clear if lost consciousness - no falls since -PT evaluation  Hx of Anxiety -continue home xanax prn  DVT prophylaxis:  Code Status: FULL CODE Family Communication: no family present at time of exam  Disposition Plan:   Consultants:  [facility]   Procedures: TTE - 5/22 - EF 55-60% - unable to comment on diastolic fxn -   Antimicrobials:  none  Subjective: Pt is resting comfortably in bed.  She reports a feeling of heaviness in her chest which has been constantly present for days.  She denies sob, abdom pain, n/v, or HA.     Objective: Blood pressure 127/100, pulse 105, temperature 97.6 F (36.4 C), temperature source Oral, resp. rate 35, height 5' 1.5" (1.562 m), weight 52.5 kg (115 lb 11.9 oz), SpO2 90 %.  Intake/Output Summary (Last 24 hours) at 02/04/16 1412 Last data filed at 02/04/16 0600  Gross per 24 hour  Intake  418.5 ml  Output    550 ml  Net -131.5 ml   Filed Weights   02/03/16 2200  Weight: 52.5 kg (115 lb 11.9 oz)    Examination: General: No acute respiratory distress - alert and conversant  Lungs: Clear to auscultation bilaterally but w/ poor air movement in B bases  Cardiovascular: Irreg irreg - rate ~100 - no rub or M Abdomen: Nontender, nondistended, soft, bowel sounds positive, no rebound, no ascites, no appreciable mass Extremities: No significant cyanosis, clubbing, or edema bilateral lower extremities  CBC:  Recent Labs Lab 02/03/16 1306 02/04/16 0350  WBC 9.0 9.0  HGB 12.3 11.7*  HCT 36.7 35.7*  MCV 86.8 87.7  PLT 164 123456*   Basic Metabolic Panel:  Recent Labs Lab 02/03/16 1306 02/03/16 1743 02/04/16 0350  NA 134*  --  132*  K 4.0  --  3.8  CL 105  --  104  CO2 21*  --  20*  GLUCOSE 99  --  112*  BUN 8  --  6  CREATININE 0.94  --  0.80  CALCIUM 9.2  --  8.9  MG  --  1.8  --    GFR: Estimated Creatinine Clearance: 42.7 mL/min (by C-G formula based on Cr of 0.8).  Liver Function Tests: No results for input(s): AST, ALT, ALKPHOS, BILITOT, PROT, ALBUMIN in the last 168 hours. No results for input(s): LIPASE, AMYLASE in the last 168 hours. No results for input(s): AMMONIA in the last 168 hours.  Coagulation Profile: No results for input(s): INR, PROTIME in the last 168 hours.  Cardiac Enzymes: No results for input(s): CKTOTAL, CKMB, CKMBINDEX, TROPONINI in the last 168 hours.  HbA1C: HGB A1C MFR BLD  Date/Time Value Ref Range Status  01/08/2013 06:45 AM 5.3 <5.7 % Final    Comment:    (NOTE)                                                                        According to the ADA Clinical Practice Recommendations for 2011, when HbA1c is used as a screening test:  >=6.5%   Diagnostic of Diabetes Mellitus           (if abnormal result is confirmed) 5.7-6.4%   Increased risk of developing Diabetes Mellitus References:Diagnosis and Classification of Diabetes Mellitus,Diabetes [id number] 1):S62-S69 and Standards of Medical Care in         Diabetes - 2011,Diabetes [id number] (Suppl 1):S11-S61.    CBG: No results for input(s): GLUCAP in the last 168 hours.  Recent Results (from the past 240 hour(s))  MRSA PCR Screening     Status: None   Collection Time: 02/03/16  7:05 PM  Result Value Ref Range Status   MRSA by PCR NEGATIVE NEGATIVE Final    Comment:        The GeneXpert MRSA Assay (FDA approved for NASAL specimens only), is one component of a comprehensive MRSA colonization surveillance program. It is not intended to diagnose MRSA infection nor to guide or monitor treatment for MRSA infections.      Scheduled Meds: . acetaminophen  650 mg Oral Once  . azaTHIOprine  50 mg Oral Daily  . clopidogrel  75 mg Oral q morning - 10a  . diltiazem  360 mg Oral Daily  . levothyroxine  88 mcg Oral QAC breakfast  . [START ON 02/05/2016] losartan  50 mg Oral Daily  . Rivaroxaban  15 mg Oral Q supper   Continuous Infusions:      Time spent: 35 minutes   Cherene Altes, MD [facility] Office  [phone] Pager - Text Page per Amion as per below:  On-Call/Text Page:      [email or web address]      password [email or web address]  If 7PM-7AM, please contact night-coverage [email or web address] Password [email or web address] 02/04/2016, 2:12 PM

## 2016-02-04 NOTE — Progress Notes (Signed)
  Echocardiogram 2D Echocardiogram has been performed.  Aggie Cosier 02/04/2016, 8:37 AM

## 2016-02-04 NOTE — Progress Notes (Signed)
Patient Name: Michele Diaz Date of Encounter: 02/04/2016  Primary Cardiologist: Dr. Carlyle Dolly   Principal Problem:   Atrial fibrillation with rapid ventricular response [facility]) Active Problems:   Hypothyroidism   Essential hypertension   GERD   Polymyalgia rheumatica (HCC)   VERTIGO   BREAST CANCER, HX OF   Prolonged Q-T interval on ECG    SUBJECTIVE    CURRENT MEDS . acetaminophen  650 mg Oral Once  . azaTHIOprine  50 mg Oral Daily  . clopidogrel  75 mg Oral q morning - 10a  . diltiazem  360 mg Oral Daily  . levothyroxine  88 mcg Oral QAC breakfast  . [START ON 02/05/2016] losartan  50 mg Oral Daily  . perflutren lipid microspheres (DEFINITY) IV suspension      . Rivaroxaban  15 mg Oral Q supper    OBJECTIVE  Filed Vitals:   02/04/16 0615 02/04/16 0630 02/04/16 0645 02/04/16 0800  BP: 110/77 109/65 115/57 110/59  Pulse: 140 68 89 88  Temp:    98 F (36.7 C)  TempSrc:    Oral  Resp: 37 34 29 34  Height:      Weight:      SpO2: 95% 94% 95% 94%    Intake/Output Summary (Last 24 hours) at 02/04/16 0921 Last data filed at 02/04/16 0600  Gross per 24 hour  Intake  418.5 ml  Output    550 ml  Net -131.5 ml   Filed Weights   02/03/16 2200  Weight: 115 lb 11.9 oz (52.5 kg)    PHYSICAL EXAM  General: Pleasant, NAD. Neuro: Alert and oriented X 3. Moves all extremities spontaneously. Psych: Normal affect. HEENT:  Normal  Neck: Supple without bruits or JVD. Lungs:  Resp regular and unlabored. Decreased breath sound in bilateral bases. Heart: Irregular. no s3, s4, or murmurs. Abdomen: Soft, non-tender, non-distended, BS + x 4.  Extremities: No clubbing, cyanosis or edema. DP/PT/Radials 2+ and equal bilaterally.  Accessory Clinical Findings  CBC  Recent Labs  02/03/16 1306 02/04/16 0350  WBC 9.0 9.0  HGB 12.3 11.7*  HCT 36.7 35.7*  MCV 86.8 87.7  PLT 164 123456*   Basic Metabolic Panel  Recent Labs  02/03/16 1306 02/03/16 1743  02/04/16 0350  NA 134*  --  132*  K 4.0  --  3.8  CL 105  --  104  CO2 21*  --  20*  GLUCOSE 99  --  112*  BUN 8  --  6  CREATININE 0.94  --  0.80  CALCIUM 9.2  --  8.9  MG  --  1.8  --    D-Dimer  Recent Labs  02/03/16 1911  DDIMER 1.19*   Thyroid Function Tests  Recent Labs  02/03/16 1743  TSH 0.150*    TELE afib with HR 90-100s    ECG  No new EKG  Echocardiogram  pending    Radiology/Studies  Dg Chest 2 View  02/03/2016  CLINICAL DATA:  Shortness of breath. EXAM: CHEST  2 VIEW COMPARISON:  January 08, 2013 FINDINGS: Cardiomegaly. Increased haziness over the right base consistent with a layering effusion. There is associated opacity which could be atelectasis. No overt edema. No other acute abnormalities. IMPRESSION: New right layering effusion with associated opacity, possibly atelectasis. Cardiomegaly with no overt edema. Electronically Signed   By: Dorise Bullion III M.D   On: 02/03/2016 14:21   Dg Chest Port 1 View  02/04/2016  CLINICAL DATA:  Dyspnea  EXAM: PORTABLE CHEST 1 VIEW COMPARISON:  02/03/2016 FINDINGS: Progression of right lower lobe airspace disease and right effusion. Progression of mild left lower lobe atelectasis Pulmonary vascular congestion is present. Question mild fluid overload. IMPRESSION: Progression of bibasilar airspace disease, right greater than left. Progression of right effusion. Question congestive heart failure. Electronically Signed   By: Franchot Gallo M.D.   On: 02/04/2016 07:13    Cath report 11/03/2000 RESULTS: The aortic pressure was 155/68 with a mean of 98. Left ventricular pressure was 155/20.  The left main coronary artery: The left main coronary artery was free of significant disease.  Left anterior descending: The left anterior descending artery gave rise to two diagonal branches and four septal perforators. These and the LAD proper were free of significant disease.  Circumflex artery: The circumflex artery  gave rise to a large intermediate branch, a marginal branch and an AV branch which terminated in the posterolateral branch. These vessels were free of significant disease.  Right coronary artery: The right coronary is a moderate sized vessel that gave rise to a large right ventricular branch, small posterior descending branch and two small posterolateral branches. These vessels were free of significant disease.  LEFT VENTRICULOGRAPHY: The left ventriculogram was performed in the RAO projection showed good wall motion with no evidence of hypokinesis. The estimated ejection fraction was 60%.  DISTAL AORTOGRAM: Distal aortogram was performed which showed patent renal artery stenosis with no significant aortoiliac obstruction.  CONCLUSIONS: Normal coronary angiography and left ventricular wall motion.  RECOMMENDATIONS: Reassurance.    ASSESSMENT AND PLAN  Michele Diaz is a 82 y.o.female with history of chest pain with cath 2002 with patent coronaries, asthma, HTN, HL admitted with DOE and palpitations. From EMS she was noted to be in afib with RVR, given 10mg  of IV dilt and started on drip. She reports intermittent palpitations for the last several weeks.   1. Newly diagnosed afib  - CHA2DS2-Vasc score 6 (HTN, CVA, female, age)  - started on Xarelto. Pending echo this morning, CXR shows possible HF, does have decreased breath sound in bilateral bases, R>L, but no obvious rale. No LE edema. Will wait for echo before consider lasix  - will d/c dilt gtt, change to 340mg  diltiazem CD. Will cut losartan down to 50mg , potentially add BB tomorrow AM to control HR even better.   - monitor for sign of bleeding given the need for plavix and Xarelto.   2. Elevated d-dimer: per IM  Signed, [name] PA-C Pager: [phone]  Agree with note by [name] PA-C  Afib with RVR. Agree with NOAC and transition to PO Dilt. 2D pending. Will ultimately require OP DCCV. Exam benign.   Lorretta Harp, M.D., [place], [facility], [place] [address] [place], [place]  [id number]  [phone] 02/04/2016 10:32 AM

## 2016-02-05 ENCOUNTER — Observation Stay (HOSPITAL_BASED_OUTPATIENT_CLINIC_OR_DEPARTMENT_OTHER): Payer: Medicare Other

## 2016-02-05 DIAGNOSIS — E876 Hypokalemia: Secondary | ICD-10-CM

## 2016-02-05 DIAGNOSIS — Z8249 Family history of ischemic heart disease and other diseases of the circulatory system: Secondary | ICD-10-CM | POA: Diagnosis not present

## 2016-02-05 DIAGNOSIS — R918 Other nonspecific abnormal finding of lung field: Secondary | ICD-10-CM | POA: Diagnosis not present

## 2016-02-05 DIAGNOSIS — R0600 Dyspnea, unspecified: Secondary | ICD-10-CM | POA: Diagnosis not present

## 2016-02-05 DIAGNOSIS — W19XXXA Unspecified fall, initial encounter: Secondary | ICD-10-CM | POA: Diagnosis not present

## 2016-02-05 DIAGNOSIS — E785 Hyperlipidemia, unspecified: Secondary | ICD-10-CM | POA: Diagnosis present

## 2016-02-05 DIAGNOSIS — I4581 Long QT syndrome: Secondary | ICD-10-CM

## 2016-02-05 DIAGNOSIS — I10 Essential (primary) hypertension: Secondary | ICD-10-CM | POA: Diagnosis not present

## 2016-02-05 DIAGNOSIS — F329 Major depressive disorder, single episode, unspecified: Secondary | ICD-10-CM | POA: Diagnosis present

## 2016-02-05 DIAGNOSIS — I82403 Acute embolism and thrombosis of unspecified deep veins of lower extremity, bilateral: Secondary | ICD-10-CM

## 2016-02-05 DIAGNOSIS — M353 Polymyalgia rheumatica: Secondary | ICD-10-CM

## 2016-02-05 DIAGNOSIS — M316 Other giant cell arteritis: Secondary | ICD-10-CM | POA: Diagnosis present

## 2016-02-05 DIAGNOSIS — I361 Nonrheumatic tricuspid (valve) insufficiency: Secondary | ICD-10-CM | POA: Diagnosis not present

## 2016-02-05 DIAGNOSIS — Z90710 Acquired absence of both cervix and uterus: Secondary | ICD-10-CM | POA: Diagnosis not present

## 2016-02-05 DIAGNOSIS — I4891 Unspecified atrial fibrillation: Secondary | ICD-10-CM | POA: Diagnosis present

## 2016-02-05 DIAGNOSIS — Z9181 History of falling: Secondary | ICD-10-CM | POA: Diagnosis not present

## 2016-02-05 DIAGNOSIS — R2681 Unsteadiness on feet: Secondary | ICD-10-CM | POA: Diagnosis not present

## 2016-02-05 DIAGNOSIS — Z79899 Other long term (current) drug therapy: Secondary | ICD-10-CM | POA: Diagnosis not present

## 2016-02-05 DIAGNOSIS — I340 Nonrheumatic mitral (valve) insufficiency: Secondary | ICD-10-CM | POA: Diagnosis not present

## 2016-02-05 DIAGNOSIS — Z888 Allergy status to other drugs, medicaments and biological substances status: Secondary | ICD-10-CM | POA: Diagnosis not present

## 2016-02-05 DIAGNOSIS — M797 Fibromyalgia: Secondary | ICD-10-CM | POA: Diagnosis present

## 2016-02-05 DIAGNOSIS — J90 Pleural effusion, not elsewhere classified: Secondary | ICD-10-CM | POA: Diagnosis present

## 2016-02-05 DIAGNOSIS — D649 Anemia, unspecified: Secondary | ICD-10-CM | POA: Diagnosis not present

## 2016-02-05 DIAGNOSIS — K754 Autoimmune hepatitis: Secondary | ICD-10-CM | POA: Diagnosis present

## 2016-02-05 DIAGNOSIS — E038 Other specified hypothyroidism: Secondary | ICD-10-CM | POA: Diagnosis not present

## 2016-02-05 DIAGNOSIS — Z886 Allergy status to analgesic agent status: Secondary | ICD-10-CM | POA: Diagnosis not present

## 2016-02-05 DIAGNOSIS — Z7902 Long term (current) use of antithrombotics/antiplatelets: Secondary | ICD-10-CM | POA: Diagnosis not present

## 2016-02-05 DIAGNOSIS — I517 Cardiomegaly: Secondary | ICD-10-CM | POA: Diagnosis not present

## 2016-02-05 DIAGNOSIS — K219 Gastro-esophageal reflux disease without esophagitis: Secondary | ICD-10-CM | POA: Diagnosis present

## 2016-02-05 DIAGNOSIS — M6281 Muscle weakness (generalized): Secondary | ICD-10-CM | POA: Diagnosis not present

## 2016-02-05 DIAGNOSIS — Z885 Allergy status to narcotic agent status: Secondary | ICD-10-CM | POA: Diagnosis not present

## 2016-02-05 DIAGNOSIS — Z7901 Long term (current) use of anticoagulants: Secondary | ICD-10-CM | POA: Diagnosis not present

## 2016-02-05 DIAGNOSIS — Z853 Personal history of malignant neoplasm of breast: Secondary | ICD-10-CM | POA: Diagnosis not present

## 2016-02-05 DIAGNOSIS — Z8673 Personal history of transient ischemic attack (TIA), and cerebral infarction without residual deficits: Secondary | ICD-10-CM | POA: Diagnosis not present

## 2016-02-05 DIAGNOSIS — R42 Dizziness and giddiness: Secondary | ICD-10-CM | POA: Diagnosis present

## 2016-02-05 DIAGNOSIS — Z880 Allergy status to penicillin: Secondary | ICD-10-CM | POA: Diagnosis not present

## 2016-02-05 DIAGNOSIS — E039 Hypothyroidism, unspecified: Secondary | ICD-10-CM | POA: Diagnosis not present

## 2016-02-05 LAB — COMPREHENSIVE METABOLIC PANEL
ALBUMIN: 2.7 g/dL — AB (ref 3.5–5.0)
ALK PHOS: 70 U/L (ref 38–126)
ALT: 26 U/L (ref 14–54)
AST: 43 U/L — ABNORMAL HIGH (ref 15–41)
Anion gap: 6 (ref 5–15)
BUN: 8 mg/dL (ref 6–20)
CALCIUM: 8.6 mg/dL — AB (ref 8.9–10.3)
CO2: 22 mmol/L (ref 22–32)
CREATININE: 0.82 mg/dL (ref 0.44–1.00)
Chloride: 104 mmol/L (ref 101–111)
GFR calc Af Amer: >60 mL/min (ref >60)
GFR calc non Af Amer: >60 mL/min (ref >60)
GLUCOSE: 95 mg/dL (ref 65–99)
Potassium: 3.3 mmol/L — ABNORMAL LOW (ref 3.5–5.1)
SODIUM: 132 mmol/L — AB (ref 135–145)
Total Bilirubin: 2.8 mg/dL — ABNORMAL HIGH (ref 0.3–1.2)
Total Protein: 7.8 g/dL (ref 6.5–8.1)

## 2016-02-05 LAB — CBC
HEMATOCRIT: 33.7 % — AB (ref 36.0–46.0)
HEMOGLOBIN: 10.9 g/dL — AB (ref 12.0–15.0)
MCH: 28.1 pg (ref 26.0–34.0)
MCHC: 32.3 g/dL (ref 30.0–36.0)
MCV: 86.9 fL (ref 78.0–100.0)
Platelets: 143 10*3/uL — ABNORMAL LOW (ref 150–400)
RBC: 3.88 MIL/uL (ref 3.87–5.11)
RDW: 14.1 % (ref 11.5–15.5)
WBC: 7.7 10*3/uL (ref 4.0–10.5)

## 2016-02-05 LAB — MAGNESIUM: MAGNESIUM: 1.8 mg/dL (ref 1.7–2.4)

## 2016-02-05 MED ORDER — SODIUM CHLORIDE 0.9 % IV SOLN
INTRAVENOUS | Status: DC
  Administered 2016-02-06: 11:00:00 via INTRAVENOUS

## 2016-02-05 MED ORDER — BISACODYL 10 MG RE SUPP
10.0000 mg | Freq: Once | RECTAL | Status: AC
  Administered 2016-02-05: 10 mg via RECTAL
  Filled 2016-02-05: qty 1

## 2016-02-05 MED ORDER — SODIUM CHLORIDE 0.9% FLUSH
3.0000 mL | INTRAVENOUS | Status: DC | PRN
  Filled 2016-02-05: qty 3

## 2016-02-05 MED ORDER — SODIUM CHLORIDE 0.9 % IV SOLN: 250.0000 mL | INTRAVENOUS | Status: DC

## 2016-02-05 MED ORDER — POTASSIUM CHLORIDE CRYS ER 20 MEQ PO TBCR
40.0000 meq | EXTENDED_RELEASE_TABLET | Freq: Once | ORAL | Status: AC
  Administered 2016-02-05: 40 meq via ORAL
  Filled 2016-02-05: qty 2

## 2016-02-05 MED ORDER — SODIUM CHLORIDE 0.9% FLUSH
3.0000 mL | Freq: Two times a day (BID) | INTRAVENOUS | Status: DC
  Administered 2016-02-06 – 2016-02-08 (×3): 3 mL via INTRAVENOUS

## 2016-02-05 MED ORDER — DEXTROSE 5 % IV SOLN
5.0000 mg/h | INTRAVENOUS | Status: DC
  Administered 2016-02-06: 5 mg/h via INTRAVENOUS
  Filled 2016-02-05 (×2): qty 100

## 2016-02-05 MED ORDER — MAGNESIUM OXIDE 400 (241.3 MG) MG PO TABS
400.0000 mg | ORAL_TABLET | Freq: Once | ORAL | Status: AC
  Administered 2016-02-05: 400 mg via ORAL
  Filled 2016-02-05: qty 1

## 2016-02-05 NOTE — Care Management Note (Addendum)
Case Management Note  Patient Details  Name: Michele Diaz MRN: [id number] Date of Birth: [birth year]  Subjective/Objective:       Pt admitted with A fib             Action/Plan:  Pt is from home alone.  CM spoke in detail with daughter Jarrett Soho; family very concerned for pt to return home due to only having periodic visits from family - family  Unable to provide 24 hour care and pt will be alone most of the time.  CM informed daughter that SNF option would be private pay due to medicare as primary with observation status with secondary [employer].  Daughter requested list of possible SNF options for private pay - CSW informed of request .  CM will continue to follow for disposition needs   Expected Discharge Date:  02/06/16               Expected Discharge Plan:  [place]  In-House Referral:  Clinical Social Work  Discharge planning Services     Post Acute Care Choice:    Choice offered to:     DME Arranged:    DME Agency:     HH Arranged:    HH Agency:     Status of Service:  In process, will continue to follow  Medicare Important Message Given:    Date Medicare IM Given:    Medicare IM give by:    Date Additional Medicare IM Given:    Additional Medicare Important Message give by:     If discussed at [place] of Stay Meetings, dates discussed:    Additional Comments:  Maryclare Labrador, RN 02/05/2016, 1:41 PM

## 2016-02-05 NOTE — Progress Notes (Signed)
*  PRELIMINARY RESULTS* Vascular Ultrasound Lower extremity venous duplex has been completed.  Preliminary findings: No evidence of DVT or baker's cyst.   Landry Mellow, RDMS, RVT  02/05/2016, 1:29 PM

## 2016-02-05 NOTE — Evaluation (Signed)
Occupational Therapy Evaluation Patient Details Name: BLANDINA CHAIRES MRN: [id number] DOB: 09-01-1932 Today's Date: 02/05/2016    History of Present Illness Pt is a 82 y/o F brought to the ED by EMS reporting a several week h/o fatigue and poor apetitie w/ fluttering in chest and SOB.  Pt was in a-fib.  Pt's PMH includes asthma, depression, hepatitis, fibromyalgia, chronic back pain, anxiety, Rt breast lumpectomy (breast cancer), stroke.   Clinical Impression   Pt was performing ADL and IADL at a modified independent level prior to admission. She presents with generalized weakness, decreased activity tolerance and impaired balance interfering with ability to function at her baseline. Pt for cardioversion later this week. Will follow.     Follow Up Recommendations  SNF;Supervision/Assistance - 24 hour (home with HHOT if family can provide 24 hour care)    Equipment Recommendations  None recommended by OT    Recommendations for Other Services       Precautions / Restrictions Precautions Precautions: Fall Precaution Comments: monitor O2 and HR Restrictions Weight Bearing Restrictions: No      Mobility Bed Mobility               General bed mobility comments: pt in chair  Transfers Overall transfer level: Needs assistance Equipment used: 1 person hand held assist Transfers: Sit to/from Stand Sit to Stand: Min assist         General transfer comment: min assist to rise and steady    Balance Overall balance assessment: Needs assistance Sitting-balance support: Feet supported Sitting balance-Leahy Scale: Fair       Standing balance-Leahy Scale: Fair                              ADL Overall ADL's : Needs assistance/impaired Eating/Feeding: Independent;Sitting   Grooming: Standing;Wash/dry hands;Supervision/safety   Upper Body Bathing: Set up;Sitting   Lower Body Bathing: Sit to/from stand;Min guard   Upper Body Dressing : Set up;Sitting    Lower Body Dressing: Min guard;Sitting/lateral leans   Toilet Transfer: Minimal assistance;Ambulation   Toileting- Clothing Manipulation and Hygiene: Sit to/from stand;Minimal assistance       Functional mobility during ADLs: Minimal assistance       Vision     Perception     Praxis      Pertinent Vitals/Pain Pain Assessment: No/denies pain     Hand Dominance Right   Extremity/Trunk Assessment Upper Extremity Assessment Upper Extremity Assessment: Generalized weakness   Lower Extremity Assessment Lower Extremity Assessment: Generalized weakness       Communication Communication Communication: HOH   Cognition Arousal/Alertness: Awake/alert Behavior During Therapy: WFL for tasks assessed/performed Overall Cognitive Status: Within Functional Limits for tasks assessed                     General Comments       Exercises       Shoulder Instructions      Home Living Family/patient expects to be discharged to:: Private residence Living Arrangements: Alone Available Help at Discharge: Family;Available PRN/intermittently Type of Home: Other(Comment) (town house) Home Access: Stairs to enter [employer] of Steps: 1 Entrance Stairs-Rails: None Home Layout: One level;Other (Comment) (storage upstairs which pt does not use)     Bathroom Shower/Tub: Occupational psychologist: Standard     Home Equipment: Cane - quad;Cane - single point;Shower seat;Grab bars - toilet;Grab bars - tub/shower;Walker - 2 wheels;Bedside commode  Additional Comments: Pt's sister checks on her, lives across the street.      Prior Functioning/Environment Level of Independence: Independent with assistive device(s)        Comments: Uses cane only when getting OOB at night.  Fall doing this ~3 months PTA.  Dresses and bathes at mod I.  Sits on shower chair.    OT Diagnosis: Generalized weakness   OT Problem List: Decreased strength;Decreased activity  tolerance;Impaired balance (sitting and/or standing);Decreased knowledge of use of DME or AE;Cardiopulmonary status limiting activity   OT Treatment/Interventions: Self-care/ADL training;DME and/or AE instruction;Balance training;Therapeutic activities    OT Goals(Current goals can be found in the care plan section) Acute Rehab OT Goals Patient Stated Goal: to get stronger OT Goal Formulation: With patient Time For Goal Achievement: 02/12/16 Potential to Achieve Goals: Good ADL Goals Pt Will Perform Grooming: standing;with modified independence Pt Will Perform Lower Body Bathing: with modified independence;sit to/from stand Pt Will Perform Lower Body Dressing: with modified independence;sit to/from stand Pt Will Transfer to Toilet: with modified independence;ambulating;regular height toilet Pt Will Perform Toileting - Clothing Manipulation and hygiene: with modified independence;sit to/from stand  OT Frequency: Min 2X/week   Barriers to D/C:            Co-evaluation              End of Session Equipment Utilized During Treatment: Oxygen  Activity Tolerance: Patient tolerated treatment well Patient left: in chair;with call bell/phone within reach;with chair alarm set   Time: [id number] OT Time Calculation (min): 23 min Charges:  OT General Charges $OT Visit: 1 Procedure OT Evaluation $OT Eval Moderate Complexity: 1 Procedure OT Treatments $Self Care/Home Management : 8-22 mins G-Codes:    Malka So 02/05/2016, 12:17 PM [phone]

## 2016-02-05 NOTE — Progress Notes (Signed)
CSW consulted by [facility] to discuss SNF with pt granddaughter, Jarrett Soho.  CSW explained pt observation status which means pt is not eligible for SNF placement with Medicare coverage- pt also does not have previous 3 night qualifying stay in last 30 days.  Jarrett Soho expressed understanding and will speak further with RNCM about home options- states pt is basically at baseline functioning but family just concerned with current medical issues and pt being at home alone for the first couple of weeks  CSW signing off  Domenica Reamer, [place] Worker [phone]

## 2016-02-05 NOTE — Progress Notes (Signed)
Report given to [place], RN on 3W. Patient will be transferred to 5W03 via bed. CCMD notified of transfer. Patient belongings sent with her including clothing, flowers, and purse.   Milford Cage, RN

## 2016-02-05 NOTE — Progress Notes (Signed)
Patient Profile: 82 y/o female with a h/o CP with normal LHC in 2002 and h/o HLD, admitted with DOE and palpitations, found to be in new onset atrial fibrillation. CHA2DS2-Vasc score 6 (HTN, CVA, female, age).   Subjective: No major complaints. Just feels "tired". No chest pain nor dyspnea.    Objective: Vital signs in last 24 hours: Temp:  [97.4 F (36.3 C)-98.3 F (36.8 C)] 97.4 F (36.3 C) (05/23 0700) Pulse Rate:  [40-118] 100 (05/23 0700) Resp:  [16-35] 22 (05/23 0700) BP: (99-127)/(50-100) 115/81 mmHg (05/23 0700) SpO2:  [89 %-97 %] 96 % (05/23 0700) Last BM Date: 02/02/16  Intake/Output from previous day: 05/22 0701 - 05/23 0700 In: 431.3 [P.O.:360; I.V.:71.3] Out: -  Intake/Output this shift:    Medications Current Facility-Administered Medications  Medication Dose Route Frequency Provider Last Rate Last Dose  . acetaminophen (TYLENOL) tablet 650 mg  650 mg Oral Q6H PRN Cherene Altes, MD      . ALPRAZolam Duanne Moron) tablet 0.25-0.5 mg  0.25-0.5 mg Oral BID PRN Willia Craze, NP      . azaTHIOprine Ilean Skill) tablet 50 mg  50 mg Oral Daily Willia Craze, NP   50 mg at 02/04/16 1300  . clopidogrel (PLAVIX) tablet 75 mg  75 mg Oral q morning - 10a Willia Craze, NP   75 mg at 02/04/16 0902  . diltiazem (CARDIZEM CD) 24 hr capsule 360 mg  360 mg Oral Daily [name], [place]   360 mg at 02/04/16 1047  . levothyroxine (SYNTHROID, LEVOTHROID) tablet 75 mcg  75 mcg Oral QAC breakfast Cherene Altes, MD      . losartan (COZAAR) tablet 50 mg  50 mg Oral Daily [name], [place]      . meclizine (ANTIVERT) tablet 12.5 mg  12.5 mg Oral BID PRN Willia Craze, NP      . Rivaroxaban (XARELTO) tablet 15 mg  15 mg Oral Q supper Pattricia Boss, MD   15 mg at 02/04/16 1718    PE: General appearance: alert, cooperative, no distress and elderly and frail Neck: no carotid bruit and no JVD Lungs: clear to auscultation bilaterally Heart: irregularly irregular rhythm and tachy  rate Extremities: no LEE Pulses: 2+ and symmetric Skin: warm and dry  Lab Results:   Recent Labs  02/03/16 1306 02/04/16 0350 02/05/16 0328  WBC 9.0 9.0 7.7  HGB 12.3 11.7* 10.9*  HCT 36.7 35.7* 33.7*  PLT 164 119* 143*   BMET  Recent Labs  02/03/16 1306 02/04/16 0350 02/05/16 0328  NA 134* 132* 132*  K 4.0 3.8 3.3*  CL 105 104 104  CO2 21* 20* 22  GLUCOSE 99 112* 95  BUN 8 6 8   CREATININE 0.94 0.80 0.82  CALCIUM 9.2 8.9 8.6*   Cardiac Panel (last 3 results) No results for input(s): CKTOTAL, CKMB, TROPONINI, RELINDX in the last 72 hours.  Studies/Results: 2D Echo 02/04/16 Study Conclusions  - Left ventricle: The cavity size was normal. Wall thickness was  normal. Systolic function was normal. The estimated ejection  fraction was in the range of 55% to 60%. The study is not  technically sufficient to allow evaluation of LV diastolic  function. - Mitral valve: There was mild regurgitation. - Right atrium: The atrium was mildly dilated.  Assessment/Plan  Principal Problem:   Atrial fibrillation with rapid ventricular response (HCC) Active Problems:   Hypothyroidism   Essential hypertension   GERD   Polymyalgia rheumatica (HCC)  VERTIGO   BREAST CANCER, HX OF   Prolonged Q-T interval on ECG   1. Atrial Fibrillation: resting rate remains poorly controlled. 2D echo shows normal LVEF of 55-60%. TSH is low at 0.150. On levothyroxine for hypothyroidism. Check Free T3/T4 to ensure patient is not hyperthyroid, which could exacerbate/ be the primary cause of her arrhthymia. She is hypokalemic and will need supplementation. Continue Cardizem for rate control. Recommend adding low dose metoprolol for additional rate control. Continue Xarelto for stroke prophylaxis given a CHA2DS2 VASc score of 6. She also was on Plavix as an outpatient given h/o CVA. Given advanced age and fact that she is now on Xarelto, need to consider discontinuing Plavix to minimize bleed  risk. Hgb has dropped from 12.3>.11.7>>10.9. Consider OP DCCV if no spontaneous conversion.   2. Hypothyroidism: TSH is low at 0.150. On levothyroxine for hypothyroidism. Check Free T3/T4 to ensure patient is no hyperthyroid.    3. HTN: BP is well controlled on Cardizem and losartan. May need to add low dose BB for additional rate control of afib.  4. Hypokalemia: K is 3.3. Supplement with K-dur. Check f/u BMP in the am.   Brittainy M. Ladoris Gene 02/05/2016 8:03 AM  Agree with note written by Ellen Henri  [facility]  Agree with checking TFTs as this may be contributing to AF with RVR. Pt is weak and doesn't seem to be tolerating this and I'm not sure she will be able to wait a month prior to elective DCCV. Today will make her 3rd day of Xarelto. She may benefit for TEE DCCV prior to DC to a SNF. Replete K. Agree with adding low dose BB.  Quay Burow 02/05/2016 9:30 AM

## 2016-02-05 NOTE — Care Management Obs Status (Signed)
[place] NOTIFICATION   Patient Details  Name: Michele Diaz MRN: [id number] Date of Birth: 1932/01/01   Medicare Observation Status Notification Given:  Yes    Maryclare Labrador, RN 02/05/2016, 4:07 PM

## 2016-02-05 NOTE — Plan of Care (Signed)
Problem: Activity: Goal: Ability to tolerate increased activity will improve Outcome: Progressing Pt feels an increase in feeling more back to baseline. Pt able to go to bathroom without shortness of breath. Pt understands that it will take some time before she feels like her normal but is willing to work to get there.

## 2016-02-05 NOTE — Progress Notes (Addendum)
PROGRESS NOTE    Michele Diaz  [id number] DOB: 1932-01-31 DOA: 02/03/2016 PCP: Walker Kehr, MD   Brief Narrative:  82 yo WF PMHx  Depression, Fibromyalgia, Anxiety, HTN, HLD, CVA (2014), PMR, Breast Cancer, Temporal Arteritis,  Hypothyroidism, Autoimmune Hepatitis, Herpes Zoster   who was brought to ED by EMS w/ a several week history of fatigue and poor appetite. She had been having intermittent fluttering in chest over several days. EMS found the patient's heart rate in 180s She received 10mg  of IV Cardizem followed by 5 mg/hr drip. RN in ED discontinued gtt but it had to be restarted for recurrent afib with RVR.    Assessment & Plan:   Principal Problem:   Atrial fibrillation with rapid ventricular response (HCC) Active Problems:   Hypothyroidism   Essential hypertension   GERD   Polymyalgia rheumatica (HCC)   VERTIGO   BREAST CANCER, HX OF   Prolonged Q-T interval on ECG   Dyspnea   New onset a-fib (HCC)   Hypokalemia   Hypomagnesemia   Fall    Newly diagnosed Atrial Fibrillation with RVR(Chadsvasc score is 6)  - Xarelto initiated - Cards following  - DC Cardizem 360 mg daily, secondary to heart control A. fib restart Cardizem drip   -Per cardiology DCCV on Thursday  R layering pleural effusion with associated atx -likely simply due to RVR  - PCXR 2/24  - no sx to suggest acute infection   Elevated D-dimer -TTE w/o evidence to suggest large PE  -TTE pending - pt will be on anticoag nonetheless  -Lower extremity Dopplers pending  Prolonged QTc (647) - avoid QT prolonging medications  Polymyalgia Rheumatica -Imuran 50 mg daily  Hypertension -Patient hypotensive for a geriatric, however asymptomatic   Hx of CVA -DC Plavix secondary to patient being on Xarelto  Hypothyroidism -TSH 0.15 suggesting overcorrection  - reduce dose to 75 g daily and follow   Hypokalemia -Potassium goal> 4 -K Dur 40 mEq 1  Hypomagnesemia -Magnesium goal>  2 -Magnesium oxide 400 mg 1  Vertigo -home Antivert 12.5 mg BID  Fall -fell at home 3 months ago - no preceding dizziness, did not trip, not clear if lost consciousness - no falls since -PT evaluation; recommends SNF  Hx of Anxiety -continue home xanax prn   DVT prophylaxis: Xarelto Code Status: Full Family Communication: No family available Disposition Plan: SNF   Consultants:  Dr.Jonathan Adora Fridge cardiology  Procedures/Significant Events:  5/22 echocardiogram;LVEF= 55% to 60%. Study not  technically sufficient to evaluation of LV diastolic  function. - Mitral valve: mild regurgitation.- Right atrium: mildly dilated.  Cultures None  Antimicrobials: None   Devices None   LINES / TUBES:  None    Continuous Infusions:    Subjective: 5/23 A/O 4, NAD. Patient states no previous history of A. fib, states started out as a feeling of not being able to catch her breath. Patient also states that has had several episodes of feeling rapid beat in her shoulder (possible episodes of A. Fib?). Patient not sure that she would want to go to SNF, would like to wait until after DC CV and then be reevaluated by PT    Objective: Filed Vitals:   02/05/16 0600 02/05/16 0700 02/05/16 0800 02/05/16 1124  BP: 111/70 115/81 123/77 112/73  Pulse: 105 100 106   Temp:  97.4 F (36.3 C)    TempSrc:  Oral    Resp: 21 22 24    Height:  Weight:      SpO2: 93% 96% 96%     Intake/Output Summary (Last 24 hours) at 02/05/16 1219 Last data filed at 02/05/16 0900  Gross per 24 hour  Intake    480 ml  Output      0 ml  Net    480 ml   Filed Weights   02/03/16 2200  Weight: 52.5 kg (115 lb 11.9 oz)    Examination:  General: A/O 4, sitting in chair comfortably, No acute respiratory distress Eyes: negative scleral hemorrhage, negative anisocoria, negative icterus ENT: Negative Runny nose, negative gingival bleeding, Neck:  Negative scars, masses, torticollis,  lymphadenopathy, JVD Lungs: Clear to auscultation bilaterally without wheezes or crackles Cardiovascular: Irregular irregular rhythm and rate, without murmur gallop or rub normal S1 and S2 Abdomen: negative abdominal pain, nondistended, positive soft, bowel sounds, no rebound, no ascites, no appreciable mass Extremities: No significant cyanosis, clubbing, or edema bilateral lower extremities Skin: Negative rashes, lesions, ulcers Psychiatric:  Negative depression, negative anxiety, negative fatigue, negative mania  Central nervous system:  Cranial nerves II through XII intact, tongue/uvula midline, all extremities muscle strength 5/5, sensation intact throughout, negative dysarthria, negative expressive aphasia, negative receptive aphasia.  .     Data Reviewed: Care during the described time interval was provided by me .  I have reviewed this patient's available data, including medical history, events of note, physical examination, and all test results as part of my evaluation. I have personally reviewed and interpreted all radiology studies.  CBC:  Recent Labs Lab 02/03/16 1306 02/04/16 0350 02/05/16 0328  WBC 9.0 9.0 7.7  HGB 12.3 11.7* 10.9*  HCT 36.7 35.7* 33.7*  MCV 86.8 87.7 86.9  PLT 164 119* [id number]*   Basic Metabolic Panel:  Recent Labs Lab 02/03/16 1306 02/03/16 1743 02/04/16 0350 02/05/16 0328  NA 134*  --  132* 132*  K 4.0  --  3.8 3.3*  CL 105  --  104 104  CO2 21*  --  20* 22  GLUCOSE 99  --  112* 95  BUN 8  --  6 8  CREATININE 0.94  --  0.80 0.82  CALCIUM 9.2  --  8.9 8.6*  MG  --  1.8  --  1.8   GFR: Estimated Creatinine Clearance: 41.6 mL/min (by C-G formula based on Cr of 0.82). Liver Function Tests:  Recent Labs Lab 02/05/16 0328  AST 43*  ALT 26  ALKPHOS 70  BILITOT 2.8*  PROT 7.8  ALBUMIN 2.7*   No results for input(s): LIPASE, AMYLASE in the last 168 hours. No results for input(s): AMMONIA in the last 168 hours. Coagulation Profile: No  results for input(s): INR, PROTIME in the last 168 hours. Cardiac Enzymes: No results for input(s): CKTOTAL, CKMB, CKMBINDEX, TROPONINI in the last 168 hours. BNP (last 3 results) No results for input(s): PROBNP in the last 8760 hours. HbA1C: No results for input(s): HGBA1C in the last 72 hours. CBG: No results for input(s): GLUCAP in the last 168 hours. Lipid Profile: No results for input(s): CHOL, HDL, LDLCALC, TRIG, CHOLHDL, LDLDIRECT in the last 72 hours. Thyroid Function Tests:  Recent Labs  02/03/16 1743  TSH 0.150*   Anemia Panel: No results for input(s): VITAMINB12, FOLATE, FERRITIN, TIBC, IRON, RETICCTPCT in the last 72 hours. Urine analysis:    Component Value Date/Time   COLORURINE YELLOW 01/07/2013 1626   APPEARANCEUR CLEAR 01/07/2013 1626   LABSPEC 1.012 01/07/2013 1626   PHURINE 6.0 01/07/2013 1626  GLUCOSEU NEGATIVE 01/07/2013 1626   GLUCOSEU NEGATIVE 11/13/2010 1058   HGBUR NEGATIVE 01/07/2013 1626   BILIRUBINUR NEGATIVE 01/07/2013 1626   KETONESUR NEGATIVE 01/07/2013 1626   PROTEINUR NEGATIVE 01/07/2013 1626   UROBILINOGEN 0.2 01/07/2013 1626   NITRITE POSITIVE* 01/07/2013 1626   LEUKOCYTESUR TRACE* 01/07/2013 1626   Sepsis Labs: @LABRCNTIP (procalcitonin:4,lacticidven:4)  ) Recent Results (from the past 240 hour(s))  MRSA PCR Screening     Status: None   Collection Time: 02/03/16  7:05 PM  Result Value Ref Range Status   MRSA by PCR NEGATIVE NEGATIVE Final    Comment:        The GeneXpert MRSA Assay (FDA approved for NASAL specimens only), is one component of a comprehensive MRSA colonization surveillance program. It is not intended to diagnose MRSA infection nor to guide or monitor treatment for MRSA infections.          Radiology Studies: Dg Chest 2 View  02/03/2016  CLINICAL DATA:  Shortness of breath. EXAM: CHEST  2 VIEW COMPARISON:  January 08, 2013 FINDINGS: Cardiomegaly. Increased haziness over the right base consistent with a  layering effusion. There is associated opacity which could be atelectasis. No overt edema. No other acute abnormalities. IMPRESSION: New right layering effusion with associated opacity, possibly atelectasis. Cardiomegaly with no overt edema. Electronically Signed   By: Dorise Bullion III M.D   On: 02/03/2016 14:21   Dg Chest Port 1 View  02/04/2016  CLINICAL DATA:  Dyspnea EXAM: PORTABLE CHEST 1 VIEW COMPARISON:  02/03/2016 FINDINGS: Progression of right lower lobe airspace disease and right effusion. Progression of mild left lower lobe atelectasis Pulmonary vascular congestion is present. Question mild fluid overload. IMPRESSION: Progression of bibasilar airspace disease, right greater than left. Progression of right effusion. Question congestive heart failure. Electronically Signed   By: Franchot Gallo M.D.   On: 02/04/2016 07:13        Scheduled Meds: . azaTHIOprine  50 mg Oral Daily  . clopidogrel  75 mg Oral q morning - 10a  . diltiazem  360 mg Oral Daily  . levothyroxine  75 mcg Oral QAC breakfast  . losartan  50 mg Oral Daily  . magnesium oxide  400 mg Oral Once  . Rivaroxaban  15 mg Oral Q supper   Continuous Infusions:       Time spent: 40 minutes    Loras Grieshop, Geraldo Docker, MD [facility] Pager [phone]   If 7PM-7AM, please contact night-coverage [email or web address] Password [facility] 02/05/2016, 12:19 PM

## 2016-02-06 ENCOUNTER — Inpatient Hospital Stay (HOSPITAL_COMMUNITY): Payer: Medicare Other

## 2016-02-06 ENCOUNTER — Encounter (HOSPITAL_COMMUNITY): Payer: Self-pay | Admitting: Critical Care Medicine

## 2016-02-06 ENCOUNTER — Inpatient Hospital Stay (HOSPITAL_COMMUNITY): Payer: Medicare Other | Admitting: Critical Care Medicine

## 2016-02-06 ENCOUNTER — Encounter (HOSPITAL_COMMUNITY)
Admission: EM | Disposition: A | Discharge status: Skilled Nursing Facility | Payer: Self-pay | Source: Home / Self Care | Attending: Internal Medicine

## 2016-02-06 DIAGNOSIS — I340 Nonrheumatic mitral (valve) insufficiency: Secondary | ICD-10-CM

## 2016-02-06 DIAGNOSIS — E039 Hypothyroidism, unspecified: Secondary | ICD-10-CM

## 2016-02-06 DIAGNOSIS — I4891 Unspecified atrial fibrillation: Secondary | ICD-10-CM

## 2016-02-06 HISTORY — PX: TEE WITHOUT CARDIOVERSION: SHX5443

## 2016-02-06 HISTORY — PX: CARDIOVERSION: SHX1299

## 2016-02-06 LAB — BASIC METABOLIC PANEL
Anion gap: 6 (ref 5–15)
BUN: 10 mg/dL (ref 6–20)
CO2: 22 mmol/L (ref 22–32)
Calcium: 8.7 mg/dL — ABNORMAL LOW (ref 8.9–10.3)
Chloride: 102 mmol/L (ref 101–111)
Creatinine, Ser: 0.73 mg/dL (ref 0.44–1.00)
GFR calc Af Amer: >60 mL/min (ref >60)
GLUCOSE: 93 mg/dL (ref 65–99)
POTASSIUM: 3.8 mmol/L (ref 3.5–5.1)
Sodium: 130 mmol/L — ABNORMAL LOW (ref 135–145)

## 2016-02-06 LAB — MAGNESIUM: Magnesium: 1.9 mg/dL (ref 1.7–2.4)

## 2016-02-06 SURGERY — CARDIOVERSION
Anesthesia: Monitor Anesthesia Care

## 2016-02-06 MED ORDER — PROPOFOL 500 MG/50ML IV EMUL
INTRAVENOUS | Status: DC | PRN
  Administered 2016-02-06: 75 ug/kg/min via INTRAVENOUS

## 2016-02-06 MED ORDER — PROPOFOL 10 MG/ML IV BOLUS
INTRAVENOUS | Status: DC | PRN
  Administered 2016-02-06: 10 mg via INTRAVENOUS
  Administered 2016-02-06: 20 mg via INTRAVENOUS

## 2016-02-06 MED ORDER — METOPROLOL TARTRATE 12.5 MG HALF TABLET
12.5000 mg | ORAL_TABLET | Freq: Two times a day (BID) | ORAL | Status: DC
  Administered 2016-02-06 – 2016-02-08 (×5): 12.5 mg via ORAL
  Filled 2016-02-06 (×5): qty 1

## 2016-02-06 NOTE — Progress Notes (Signed)
PT Cancellation Note  Patient Details Name: Michele Diaz MRN: [id number] DOB: [birth year]   Cancelled Treatment:    Reason Eval/Treat Not Completed: Patient at procedure or test/unavailable   Duncan Dull 02/06/2016, 1:43 PM Alben Deeds, Sisters DPT  [phone]

## 2016-02-06 NOTE — Progress Notes (Signed)
Page TEAM 1 - Stepdown/ICU TEAM  KEMORIA MACFADYEN  [id number] DOB: [birth year] DOA: 02/03/2016 PCP: Walker Kehr, MD    Brief Narrative:  82 yo female with history of HTN, CVA, PMR, breast cancer, and hypothyroidism who was brought to ED by EMS w/ a several week history of fatigue and poor appetite. She had been having intermittent fluttering in chest over several days. EMS found the patient's heart rate in 180s She received 10mg  of IV Cardizem followed by 5 mg/hr drip. RN in ED discontinued gtt but it had to be restarted for recurrent afib with RVR.   Assessment & Plan:  Newly diagnosed Atrial Fibrillation with RVR -Chadsvasc score is 6 - Xarelto continues (initiated this admission) - Cards following - for TEE/DCCV today    R layering pleural effusion with associated atx -likely simply due to RVR - f/u CXR in AM - no sx to suggest acute infection   Elevated D-dimer TTE w/o evidence to suggest large PE - B LE venous duplex w/o evidence of DVT   Prolonged QTc (647) -Mg+ and K+ normal - TSH normal - avoid QT prolonging medications - has improved on f/u EKGs  Normocytic anemia -no clear evidence of blood loss - newly placed on Xarelto - follow in serial fashion   Polymyalgia Rheumatica -continue home Imuran  Hypertension -BP controlled   Hx of CVA -previously on Plavix, but this was stopped by [name] on 5/23 - may not be unreasonable to cont Plavix since pt is not on ASA - follow w/o resumption for now   Hypothyroidism -TSH 0.15 suggesting overcorrection - reduced dose to 75mcg - FT4 and FT3 would not add to the workup so I have canceled these labs - will need recheck of TSH in 6-8 weeks   Vertigo -continue home Antivert - controlled presently   Fall -fell at home 3 months ago - no preceding dizziness, did not trip, not clear if lost consciousness - no falls since -PT evaluation pending - pt not interested in a rehab stay presently, but will consider it   Hx of  Anxiety -continue home xanax prn  DVT prophylaxis: xarelto  Code Status: FULL CODE Family Communication: Spoke with son at bedside at length  Disposition Plan: SDU on cardizem gtt  Consultants:  [facility]   Procedures: TTE - 5/22 - EF 55-60% - unable to comment on diastolic fxn  B LE venous duplex - 5/23 - no DVT  TEE / DCCV - 5/24 - pending   Antimicrobials:  none  Subjective: The patient is alert pleasant and quite talkative.  She denies chest pain shortness of breath fevers chills or abdominal pain.  I have discussed possibility of inpatient rehabilitation or even SNF based rehabilitation and she has already made it clear that she would not be interested in this.  Objective: Blood pressure 141/92, pulse 103, temperature 98 F (36.7 C), temperature source Oral, resp. rate 16, height 5' 1.5" (1.562 m), weight 50.621 kg (111 lb 9.6 oz), SpO2 96 %.  Intake/Output Summary (Last 24 hours) at 02/06/16 1043 Last data filed at 02/06/16 0045  Gross per 24 hour  Intake    320 ml  Output    150 ml  Net    170 ml   Filed Weights   02/03/16 2200 02/06/16 0530  Weight: 52.5 kg (115 lb 11.9 oz) 50.621 kg (111 lb 9.6 oz)    Examination: General: No acute respiratory distress - alert/conversant  Lungs: Clear to auscultation  bilaterally - no wheeze  Cardiovascular: Irreg irreg - no rub or M Abdomen: Nontender, nondistended, soft, bowel sounds positive, no rebound Extremities: No significant cyanosis, clubbing, edema bilateral lower extremities  CBC:  Recent Labs Lab 02/03/16 1306 02/04/16 0350 02/05/16 0328  WBC 9.0 9.0 7.7  HGB 12.3 11.7* 10.9*  HCT 36.7 35.7* 33.7*  MCV 86.8 87.7 86.9  PLT 164 119* [id number]*   Basic Metabolic Panel:  Recent Labs Lab 02/03/16 1306 02/03/16 1743 02/04/16 0350 02/05/16 0328 02/06/16 0326  NA 134*  --  132* 132* 130*  K 4.0  --  3.8 3.3* 3.8  CL 105  --  104 104 102  CO2 21*  --  20* 22 22  GLUCOSE 99  --  112* 95 93  BUN 8  --   6 8 10   CREATININE 0.94  --  0.80 0.82 0.73  CALCIUM 9.2  --  8.9 8.6* 8.7*  MG  --  1.8  --  1.8 1.9   GFR: Estimated Creatinine Clearance: 42.7 mL/min (by C-G formula based on Cr of 0.73).  Liver Function Tests:  Recent Labs Lab 02/05/16 0328  AST 43*  ALT 26  ALKPHOS 70  BILITOT 2.8*  PROT 7.8  ALBUMIN 2.7*    HbA1C: HGB A1C MFR BLD  Date/Time Value Ref Range Status  01/08/2013 06:45 AM 5.3 <5.7 % Final    Comment:    (NOTE)                                                                       According to the ADA Clinical Practice Recommendations for 2011, when HbA1c is used as a screening test:  >=6.5%   Diagnostic of Diabetes Mellitus           (if abnormal result is confirmed) 5.7-6.4%   Increased risk of developing Diabetes Mellitus References:Diagnosis and Classification of Diabetes Mellitus,Diabetes [id number] 1):S62-S69 and Standards of Medical Care in         Diabetes - 2011,Diabetes [id number] (Suppl 1):S11-S61.    Recent Results (from the past 240 hour(s))  MRSA PCR Screening     Status: None   Collection Time: 02/03/16  7:05 PM  Result Value Ref Range Status   MRSA by PCR NEGATIVE NEGATIVE Final    Comment:        The GeneXpert MRSA Assay (FDA approved for NASAL specimens only), is one component of a comprehensive MRSA colonization surveillance program. It is not intended to diagnose MRSA infection nor to guide or monitor treatment for MRSA infections.      Scheduled Meds: . azaTHIOprine  50 mg Oral Daily  . levothyroxine  75 mcg Oral QAC breakfast  . losartan  50 mg Oral Daily  . metoprolol tartrate  12.5 mg Oral BID  . Rivaroxaban  15 mg Oral Q supper  . sodium chloride flush  3 mL Intravenous Q12H   Continuous Infusions: . sodium chloride    . sodium chloride    . diltiazem (CARDIZEM) infusion       LOS: 1 day   Time spent: 35 minutes   Cherene Altes, MD [facility] Office  [phone] Pager - Text  Page per Amion as per below:  On-Call/Text Page:  [email or web address]      password [email or web address]  If 7PM-7AM, please contact night-coverage [email or web address] Password [facility] 02/06/2016, 10:43 AM

## 2016-02-06 NOTE — Plan of Care (Signed)
Problem: Safety: Goal: Ability to remain free from injury will improve Outcome: Progressing Per RN fall risk assessment, patient moderate risk for falls.  Fall risk measures in place and patient educated to call and wait for staff assistance prior to getting out of bed.  Patient stated understanding and has not gotten out of bed unassisted thus far this shift. Safe environment being provided per Advice worker.

## 2016-02-06 NOTE — Progress Notes (Signed)
Patient heart rate 80's to low 100's, primary 80-90's in atrial fibrillation.  Order to start Cardizem IV at 2000.  Patient received Cardizem CD 360mg  by mouth at 11:24am.  RN called pharmacy and spoke with pharmacist pertaining to information listed in this note.  Pharmacist suggested to page MD for clarification.  RN paged [facility] with this information.

## 2016-02-06 NOTE — Plan of Care (Signed)
Problem: Education: Goal: Knowledge of [place] [facility] Education information/materials will improve Outcome: Progressing Patient aware of plan of care, verbalizes understanding.  Patient has denied pain thus far this shift.  RN educated patient to notify staff if patient started to experience any pain. Patient stated understanding.

## 2016-02-06 NOTE — Plan of Care (Signed)
Problem: Activity: Goal: Ability to tolerate increased activity will improve Outcome: Progressing Patient ambulating to and from the bathroom with staff stand-by assist no shortness of breath noted.  Uninterrupted sleeping periods being provided thus far this shift.

## 2016-02-06 NOTE — CV Procedure (Signed)
Brief TEE Report  LVEF 50-55% Moderate mitral regurgitation Mild tricuspid regurgitation No LA or LAA thrombus or mass  For further details see full report.  Electrical Cardioversion Procedure Note Michele Diaz [id number] [birth year]  Procedure: Electrical Cardioversion Indications:  Atrial Fibrillation  Procedure Details Consent: Risks of procedure as well as the alternatives and risks of each were explained to the (patient/caregiver).  Consent for procedure obtained. Time Out: Verified patient identification, verified procedure, site/side was marked, verified correct patient position, special equipment/implants available, medications/allergies/relevent history reviewed, required imaging and test results available.  Performed  Patient placed on cardiac monitor, pulse oximetry, supplemental oxygen as necessary.  Sedation given: propofol Pacer pads placed anterior and posterior chest.  Cardioverted 1 time(s).  Cardioverted at 150J.  Evaluation Findings: Post procedure EKG shows: NSR Complications: None Patient did tolerate procedure well.   Michele Diaz C. Oval Linsey, MD, [facility]   02/06/2016, 2:59 PM

## 2016-02-06 NOTE — NC FL2 (Signed)
[place] LEVEL OF CARE SCREENING TOOL     IDENTIFICATION  Patient Name: Michele Diaz Birthdate: April 26, 1932 Sex: female Admission Date (Current Location): 02/03/2016  [facility] and [place] Number:  Herbalist and Address:  [facility], [place] [address] [place], [place] [address]      Provider Number: [id number]  Attending Physician Name and Address:  Cherene Altes, MD  Relative Name and Phone Number:       Current Level of Care: Hospital Recommended Level of Care: [place] Prior Approval Number:    Date Approved/Denied:   PASRR Number: [id number]  Discharge Plan: SNF    Current Diagnoses: Patient Active Problem List   Diagnosis Date Noted  . Atrial fibrillation with RVR ([place]) 02/05/2016  . Dyspnea   . New onset a-fib ([place])   . Hypokalemia   . Hypomagnesemia   . Fall   . Prolonged Q-T interval on ECG 02/03/2016  . Atrial fibrillation with rapid ventricular response ([place]) 02/03/2016  . Cataract 03/20/2014  . Epistaxis 08/22/2013  . Wart viral 04/24/2013  . Actinic keratoses 04/22/2013  . Stroke ([place]) 01/07/2013  . Well adult exam 06/01/2012  . Headache(784.0) 03/02/2012  . Grief reaction 09/19/2011  . Memory deficit 05/26/2011  . Chest pain 04/24/2011  . Chronic hyponatremia 07/16/2010  . DEPRESSION/ANXIETY 04/03/2010  . Fatigue 03/06/2010  . WEIGHT LOSS 03/06/2010  . VERTIGO 10/23/2009  . LIVER FUNCTION TESTS, ABNORMAL, HX OF 08/17/2009  . CHEST WALL PAIN 04/06/2008  . GERD 10/15/2007  . Other specified acquired hypothyroidism 10/06/2007  . Hypothyroidism 07/19/2007  . Essential hypertension 07/19/2007  . ALLERGIC RHINITIS 07/19/2007  . DIVERTICULOSIS, COLON 07/19/2007  . OSTEOARTHRITIS 07/19/2007  . BREAST CANCER, HX OF 07/19/2007  . VITAMIN D DEFICIENCY 06/09/2007  . Polymyalgia rheumatica ([place]) 06/09/2007    Orientation RESPIRATION BLADDER Height & Weight     Self, Time,  Situation, Place  Normal Continent Weight: 111 lb 9.6 oz (50.621 kg) Height:  5' 1.5" (156.2 cm)  BEHAVIORAL SYMPTOMS/MOOD NEUROLOGICAL BOWEL NUTRITION STATUS      Continent Diet (Heart Healthy / Thin Liquids)  AMBULATORY STATUS COMMUNICATION OF NEEDS Skin   Extensive Assist Verbally Normal                       Personal Care Assistance Level of Assistance  Bathing, Feeding, Dressing Bathing Assistance: Limited assistance Feeding assistance: Independent Dressing Assistance: Limited assistance     Functional Limitations Info  Sight, Hearing, Speech Sight Info: Impaired (wears glasses) Hearing Info: Adequate Speech Info: Adequate    SPECIAL CARE FACTORS FREQUENCY  PT (By licensed PT), OT (By licensed OT)     PT Frequency: 3 OT Frequency: 3            Contractures Contractures Info: Not present    Additional Factors Info  Code Status, Allergies Code Status Info: Full Code Allergies Info: Celecoxib, Codeine, Atorvastatin, Rosuvastatin, Penicillins           Current Medications (02/06/2016):  This is the current hospital active medication list Current Facility-Administered Medications  Medication Dose Route Frequency Provider Last Rate Last Dose  . 0.9 %  sodium chloride infusion  250 mL Intravenous Continuous Cheryln Manly, NP      . acetaminophen (TYLENOL) tablet 650 mg  650 mg Oral Q6H PRN Cherene Altes, MD      . ALPRAZolam Duanne Moron) tablet 0.25-0.5 mg  0.25-0.5 mg Oral BID  PRN Willia Craze, NP      . azaTHIOprine Ilean Skill) tablet 50 mg  50 mg Oral Daily Willia Craze, NP   50 mg at 02/06/16 1044  . diltiazem (CARDIZEM) 100 mg in dextrose 5 % 100 mL (1 mg/mL) infusion  5-15 mg/hr Intravenous Titrated Allie Bossier, MD 2.5 mL/hr at 02/06/16 1453 2.5 mg/hr at 02/06/16 1453  . levothyroxine (SYNTHROID, LEVOTHROID) tablet 75 mcg  75 mcg Oral QAC breakfast Cherene Altes, MD   75 mcg at 02/06/16 1044  . losartan (COZAAR) tablet 50 mg  50 mg Oral  Daily [name], [place]   50 mg at 02/06/16 1044  . meclizine (ANTIVERT) tablet 12.5 mg  12.5 mg Oral BID PRN Willia Craze, NP      . metoprolol tartrate (LOPRESSOR) tablet 12.5 mg  12.5 mg Oral BID Cheryln Manly, NP   12.5 mg at 02/06/16 1044  . Rivaroxaban (XARELTO) tablet 15 mg  15 mg Oral Q supper Pattricia Boss, MD   15 mg at 02/05/16 1711  . sodium chloride flush (NS) 0.9 % injection 3 mL  3 mL Intravenous Q12H Cheryln Manly, NP      . sodium chloride flush (NS) 0.9 % injection 3 mL  3 mL Intravenous PRN Cheryln Manly, NP         Discharge Medications: Please see discharge summary for a list of discharge medications.  Relevant Imaging Results:  Relevant Lab Results:   Additional Information SSN [id number]  Barbette Or, [place]

## 2016-02-06 NOTE — Anesthesia Postprocedure Evaluation (Signed)
Anesthesia Post Note  Patient: PAMA FARO  Procedure(s) Performed: Procedure(s) (LRB): CARDIOVERSION (N/A) TRANSESOPHAGEAL ECHOCARDIOGRAM (TEE) (N/A)  Patient location during evaluation: PACU Anesthesia Type: MAC Level of consciousness: awake and alert Pain management: pain level controlled Vital Signs Assessment: post-procedure vital signs reviewed and stable Respiratory status: spontaneous breathing Cardiovascular status: stable Anesthetic complications: no    Last Vitals:  Filed Vitals:   02/06/16 1525 02/06/16 1535  BP: 106/80 127/68  Pulse: 72 75  Temp:    Resp: 17 28    Last Pain:  Filed Vitals:   02/06/16 1540  PainSc: 0-No pain                 Nolon Nations

## 2016-02-06 NOTE — Progress Notes (Signed)
  Echocardiogram Echocardiogram Transesophageal has been performed.  Michele Diaz 02/06/2016, 3:44 PM

## 2016-02-06 NOTE — Care Management Note (Addendum)
Case Management Note  Patient Details  Name: CHARNAY SHUEY MRN: [id number] Date of Birth: 1931/10/18  Subjective/Objective:    Pt admitted for Atrial Fib RVR. Pt is from home alone with support of family. Pt has a RW at home.                 Action/Plan: CM did discuss with family disposition needs. Family states pt would do better at [facility] for Rehab. Son Jeneen Rinks did [address] and [facility] as SNF choice. CM did relay information to CSW. If pt not able to have 3 day qualifying stay pt will need [place] with Private Duty Care. CM will continue to monitor.   Expected Discharge Date:  02/06/16               Expected Discharge Plan:  Skilled Nursing Facility  In-House Referral:  Clinical Social Work  Discharge planning Services  CM Consult  Post Acute Care Choice:  NA Choice offered to:  NA  DME Arranged:  N/A DME Agency:  NA  HH Arranged:  NA HH Agency:  NA  Status of Service:  Completed, signed off  Medicare Important Message Given:    Date Medicare IM Given:    Medicare IM give by:    Date Additional Medicare IM Given:    Additional Medicare Important Message give by:     If discussed at [place] of Stay Meetings, dates discussed:    Additional Comments: 1321 02-08-16 Jacqlyn Krauss, RN,BSN [phone] Staff RN provided family with Xarelto card. Pt was d[facility] to [facility]. No further needs from CM at this time.    1049 02-08-16 Jacqlyn Krauss, RN,BSN [phone] CM to provide pt with Xarelto card and additional cost thereafter. Per Staff RN pt is agreeable to SNF. CSW made aware. Plan will now be for SNF. No further needs from CM at this time.   1414 02-07-16 Jacqlyn Krauss, RN,BSN [phone] CM did speak with pt in regards to disposition needs. CM received call from [place] that pt is refusing SNF at this time. CM did go in to discuss if not SNF then the plan would be [place]. Per PT recommendations 24/7 supervision. Pt is from  home alone and has support of family. CM did provide pt with [place] list and Personal Care List. We discussed HHRN/PT Services and what each discipline would do in the home. Pt really does not not want the [facility] Services. CM asked pt to think about it overnight and CM will return in am. CM discussed that pt needs a safe discharge plan. CM did aks if it was okay to contact family- granddaughter and she stated that would be ok. MD Sherral Hammers in room at the end of conversation. CM will continue to monitor.    Xarelto is covered, no prior auth needed. Copay at retail pharmacy is $265.20 or a 90 day supply with mail order is $80. Thanks!  Bethena Roys, RN 02/06/2016, 2:32 PM

## 2016-02-06 NOTE — Anesthesia Preprocedure Evaluation (Signed)
Anesthesia Evaluation  Patient identified by MRN, date of birth, ID band Patient awake    Reviewed: Allergy & Precautions, NPO status , Patient's Chart, lab work & pertinent test results  History of Anesthesia Complications (+) PONV and history of anesthetic complications  Airway Mallampati: II  TM Distance: >3 FB Neck ROM: Full    Dental no notable dental hx.    Pulmonary shortness of breath, asthma ,    Pulmonary exam normal breath sounds clear to auscultation       Cardiovascular hypertension, Pt. on medications + Peripheral Vascular Disease  Normal cardiovascular exam+ dysrhythmias Atrial Fibrillation  Rhythm:Regular Rate:Normal     Neuro/Psych  Headaches, PSYCHIATRIC DISORDERS Anxiety Depression CVA    GI/Hepatic GERD  ,(+) Hepatitis -  Endo/Other  Hypothyroidism   Renal/GU negative Renal ROS     Musculoskeletal  (+) Arthritis , Fibromyalgia -  Abdominal   Peds  Hematology negative hematology ROS (+)   Anesthesia Other Findings   Reproductive/Obstetrics negative OB ROS                             Anesthesia Physical Anesthesia Plan  ASA: III  Anesthesia Plan: MAC   Post-op Pain Management:    Induction: Intravenous  Airway Management Planned:   Additional Equipment:   Intra-op Plan:   Post-operative Plan:   Informed Consent: I have reviewed the patients History and Physical, chart, labs and discussed the procedure including the risks, benefits and alternatives for the proposed anesthesia with the patient or authorized representative who has indicated his/her understanding and acceptance.   Dental advisory given  Plan Discussed with: CRNA  Anesthesia Plan Comments:         Anesthesia Quick Evaluation

## 2016-02-06 NOTE — Progress Notes (Signed)
RN noted patient heart rate greater than 100 sustained, patient remains in atrial fibrillation.  Patient blood pressure 107/64.  Care order from [facility] on call earlier this shift stated to hold Cardizem IV and page if heart rate sustained greater than or equal to 100.  RN paged [facility] with this information to update.

## 2016-02-06 NOTE — Transfer of Care (Signed)
Immediate Anesthesia Transfer of Care Note  Patient: Michele Diaz  Procedure(s) Performed: Procedure(s): CARDIOVERSION (N/A) TRANSESOPHAGEAL ECHOCARDIOGRAM (TEE) (N/A)  Patient Location: Endoscopy Unit  Anesthesia Type:MAC  Level of Consciousness: sedated  Airway & Oxygen Therapy: Patient Spontanous Breathing and Patient connected to nasal cannula oxygen  Post-op Assessment: Report given to RN, Post -op Vital signs reviewed and stable and Patient moving all extremities X 4  Post vital signs: Reviewed and stable  Last Vitals:  Filed Vitals:   02/06/16 1222 02/06/16 1328  BP: 133/96 154/91  Pulse: 92 101  Temp: 36.9 C   Resp: 18 26    Last Pain:  Filed Vitals:   02/06/16 1446  PainSc: 0-No pain      Patients Stated Pain Goal: 0 ([id number])  Complications: No apparent anesthesia complications

## 2016-02-06 NOTE — Progress Notes (Signed)
Patient Name: Michele Diaz Date of Encounter: 02/06/2016  Hospital Problem List     Principal Problem:   Atrial fibrillation with rapid ventricular response (HCC) Active Problems:   Hypothyroidism   Essential hypertension   GERD   Polymyalgia rheumatica (HCC)   VERTIGO   BREAST CANCER, HX OF   Prolonged Q-T interval on ECG   Dyspnea   New onset a-fib (HCC)   Hypokalemia   Hypomagnesemia   Fall   Atrial fibrillation with RVR (HCC)    Subjective   Feeling ok this morning. No complaints.   Inpatient Medications    . azaTHIOprine  50 mg Oral Daily  . levothyroxine  75 mcg Oral QAC breakfast  . losartan  50 mg Oral Daily  . Rivaroxaban  15 mg Oral Q supper  . sodium chloride flush  3 mL Intravenous Q12H    Vital Signs    Filed Vitals:   02/05/16 1947 02/06/16 0034 02/06/16 0530 02/06/16 0700  BP: 128/72 107/64 132/73 141/92  Pulse: 82  98 103  Temp: 98 F (36.7 C)  97.9 F (36.6 C) 98 F (36.7 C)  TempSrc: Oral  Oral Oral  Resp: 26  22 16   Height:      Weight:   111 lb 9.6 oz (50.621 kg)   SpO2: 93%  95% 96%    Intake/Output Summary (Last 24 hours) at 02/06/16 0835 Last data filed at 02/06/16 0045  Gross per 24 hour  Intake    680 ml  Output    150 ml  Net    530 ml   Filed Weights   02/03/16 2200 02/06/16 0530  Weight: 115 lb 11.9 oz (52.5 kg) 111 lb 9.6 oz (50.621 kg)    Physical Exam    General: Pleasant frail older female, NAD. Neuro: Alert and oriented X 3. Moves all extremities spontaneously. Psych: Normal affect. HEENT:  Normal  Neck: Supple without bruits or JVD. Lungs:  Resp regular and unlabored, CTA. Heart: irregularly irregular no s3, s4, or murmurs. Abdomen: Soft, non-tender, non-distended, BS + x 4.  Extremities: No clubbing, cyanosis or edema. DP/PT/Radials 2+ and equal bilaterally.  Labs    CBC  Recent Labs  02/04/16 0350 02/05/16 0328  WBC 9.0 7.7  HGB 11.7* 10.9*  HCT 35.7* 33.7*  MCV 87.7 86.9  PLT 119* 143*    Basic Metabolic Panel  Recent Labs  02/05/16 0328 02/06/16 0326  NA 132* 130*  K 3.3* 3.8  CL 104 102  CO2 22 22  GLUCOSE 95 93  BUN 8 10  CREATININE 0.82 0.73  CALCIUM 8.6* 8.7*  MG 1.8 1.9   Liver Function Tests  Recent Labs  02/05/16 0328  AST 43*  ALT 26  ALKPHOS 70  BILITOT 2.8*  PROT 7.8  ALBUMIN 2.7*   D-Dimer  Recent Labs  02/03/16 1911  DDIMER 1.19*   Thyroid Function Tests  Recent Labs  02/03/16 1743  TSH 0.150*    Telemetry    A-Fib Rate 110-130s.   ECG    A-Fib Rate-109  Radiology     Assessment & Plan    1. Atrial Fibrillation: resting rate remains poorly controlled. 2D echo shows normal LVEF of 55-60%. TSH is low at 0.150. On levothyroxine for hypothyroidism. Check Free T3/T4 to ensure patient is not hyperthyroid, which could exacerbate/ be the primary cause of her arrhthymia. She was hypokalemic and needed supplementation. Continue Cardizem for rate control. Recommend adding low dose metoprolol for additional  rate control. Continue Xarelto for stroke prophylaxis given a CHA2DS2 VASc score of 6. She also was on Plavix as an outpatient given h/o CVA. Plavix was stopped yesterday. Hgb has dropped from 12.3>.11.7>>10.9. Continue to monitor. --Primary team switched patient to PO Cardizem yesterday afternoon. Does not look like she has had a dose today. Will started IV gtt back given her tachycardia. --Pt is planned for TEE/CV this afternoon. Consent was obtained. She does not have any questions at this time.  2. Hypothyroidism: TSH is low at 0.150. On levothyroxine for hypothyroidism. Check Free T3/T4 to ensure patient is no hyperthyroid.   3. HTN: BP is well controlled. May need to add low dose BB for additional rate control of afib.  4. Hypokalemia: K was 3.3 yesterday, but supplemented and improved to 3.8.  Signed, Reino Bellis NP-C Pager [phone]  Agree with note by Reino Bellis NP-C  Still in Afib with RVR. Will  have had 3 doses of Xarelto. Labs OK. K better. Will put back on IV dilt and add low dose BB. Check free T3/T4. For TEE DCCV later today.   Lorretta Harp, M.D., [place], [facility], [place]verta [place] [address] [place], [place]  [id number]  [phone] 02/06/2016 10:20 AM

## 2016-02-07 ENCOUNTER — Inpatient Hospital Stay (HOSPITAL_COMMUNITY): Payer: Medicare Other

## 2016-02-07 ENCOUNTER — Encounter (HOSPITAL_COMMUNITY): Payer: Self-pay | Admitting: Cardiovascular Disease

## 2016-02-07 LAB — BASIC METABOLIC PANEL
Anion gap: 7 (ref 5–15)
BUN: 10 mg/dL (ref 6–20)
CALCIUM: 8.8 mg/dL — AB (ref 8.9–10.3)
CHLORIDE: 102 mmol/L (ref 101–111)
CO2: 24 mmol/L (ref 22–32)
CREATININE: 0.7 mg/dL (ref 0.44–1.00)
GFR calc non Af Amer: >60 mL/min (ref >60)
GLUCOSE: 68 mg/dL (ref 65–99)
Potassium: 3.8 mmol/L (ref 3.5–5.1)
Sodium: 133 mmol/L — ABNORMAL LOW (ref 135–145)

## 2016-02-07 LAB — CBC
HEMATOCRIT: 34.4 % — AB (ref 36.0–46.0)
Hemoglobin: 11.3 g/dL — ABNORMAL LOW (ref 12.0–15.0)
MCH: 28.8 pg (ref 26.0–34.0)
MCHC: 32.8 g/dL (ref 30.0–36.0)
MCV: 87.8 fL (ref 78.0–100.0)
Platelets: 203 10*3/uL (ref 150–400)
RBC: 3.92 MIL/uL (ref 3.87–5.11)
RDW: 13.9 % (ref 11.5–15.5)
WBC: 6 10*3/uL (ref 4.0–10.5)

## 2016-02-07 LAB — MAGNESIUM: Magnesium: 1.8 mg/dL (ref 1.7–2.4)

## 2016-02-07 MED ORDER — MAGNESIUM OXIDE 400 (241.3 MG) MG PO TABS
200.0000 mg | ORAL_TABLET | Freq: Once | ORAL | Status: AC
  Administered 2016-02-07: 200 mg via ORAL
  Filled 2016-02-07: qty 1

## 2016-02-07 MED ORDER — DILTIAZEM HCL ER COATED BEADS 120 MG PO CP24
120.0000 mg | ORAL_CAPSULE | Freq: Every day | ORAL | Status: DC
Start: 2016-02-07 — End: 2016-02-08
  Administered 2016-02-07 – 2016-02-08 (×2): 120 mg via ORAL
  Filled 2016-02-07 (×2): qty 1

## 2016-02-07 MED ORDER — POTASSIUM CHLORIDE CRYS ER 20 MEQ PO TBCR
20.0000 meq | EXTENDED_RELEASE_TABLET | Freq: Once | ORAL | Status: AC
  Administered 2016-02-07: 20 meq via ORAL
  Filled 2016-02-07: qty 1

## 2016-02-07 NOTE — Progress Notes (Signed)
PROGRESS NOTE    Michele Diaz  [id number] DOB: May 31, 1932 DOA: 02/03/2016 PCP: Walker Kehr, MD   Brief Narrative:  82 yo WF PMHx  Depression, Fibromyalgia, Anxiety, HTN, HLD, CVA (2014), PMR, Breast Cancer, Temporal Arteritis,  Hypothyroidism, Autoimmune Hepatitis, Herpes Zoster   who was brought to ED by EMS w/ a several week history of fatigue and poor appetite. She had been having intermittent fluttering in chest over several days. EMS found the patient's heart rate in 180s She received 10mg  of IV Cardizem followed by 5 mg/hr drip. RN in ED discontinued gtt but it had to be restarted for recurrent afib with RVR.    Assessment & Plan:   Principal Problem:   Atrial fibrillation with rapid ventricular response (HCC) Active Problems:   Hypothyroidism   Essential hypertension   GERD   Polymyalgia rheumatica (HCC)   VERTIGO   BREAST CANCER, HX OF   Prolonged Q-T interval on ECG   Dyspnea   New onset a-fib (HCC)   Hypokalemia   Hypomagnesemia   Fall   Atrial fibrillation with RVR ([place])    Newly diagnosed Atrial Fibrillation with RVR(Chadsvasc score is 6)  - Xarelto initiated - Cards following  - Cardizem on 10 mg daily -S/P DCCV on 5/24  -Patient currently in NSR -Continue Xarelto 15 mg daily per cardiology  R layering pleural effusion with associated atx -likely simply due to RVR  - PCXR 2/24  - no sx to suggest acute infection   Elevated D-dimer -TTE w/o evidence to suggest large PE  -TTE; multiple valve regurgitation, no vegetation or thrombus see results below -Lower extremity Dopplers; negative DVT  Prolonged QTc (647) - avoid QT prolonging medications  Polymyalgia Rheumatica -Imuran 50 mg daily  Hypertension -BP improved with patient's cardioversion to NSR    Hx of CVA -DC Plavix secondary to patient being on Xarelto  Hypothyroidism -TSH 0.15 suggesting overcorrection  - reduce dose to 75 g daily and follow   Hypokalemia -Potassium goal>  4 -K Dur 20 mEq 1  Hypomagnesemia -Magnesium goal> 2 -Magnesium oxide 200 mg 1  Vertigo -home Antivert 12.5 mg BID  Fall -fell at home 3 months ago - no preceding dizziness, did not trip, not clear if lost consciousness - no falls since -PT evaluation; recommends SNF  Hx of Anxiety -continue home xanax prn  Goals of care;  -have discussed at length why we recommend SNF however patient is adamant that she prefers home health. Patient did give me permission to answer any questions by Dr.Kirhnam, and RN Jarrett Soho.  -DC home with home health in [place]     DVT prophylaxis: Xarelto Code Status: Full Family Communication: No family available Disposition Plan: SNF; patient refuses   Consultants:  Dr.Jonathan Adora Fridge cardiology  Procedures/Significant Events:  5/22 echocardiogram;LVEF= 55% to 60%. Study not  technically sufficient to evaluation of LV diastolic  function. - Mitral valve: mild regurgitation.- Right atrium: mildly dilated. 5/23 bilateral lower extremity Doppler; negative DVT 5/24 TEE;- Mitral valve: mild to moderate regurgitation - Left atrium: dilated. No thrombus  - Right atrium: dilated. No  thrombus- Pulmonic valve: No vegetation.- Pericardium, extracardiac: right pleural effusion. 5/24 DC CV   Cultures None  Antimicrobials: None   Devices None   LINES / TUBES:  None    Continuous Infusions: . sodium chloride       Subjective: 5/25 A/O 4, NAD. Patient sitting comfortably in bed states feels much better since DC CV. Has been up ambulating around  the room without symptoms.    Objective: Filed Vitals:   02/07/16 0445 02/07/16 0450 02/07/16 0827 02/07/16 1345  BP:   132/89 140/80  Pulse:  75 85 78  Temp:  97.8 F (36.6 C) 98.2 F (36.8 C) 97.7 F (36.5 C)  TempSrc:  Oral Oral Oral  Resp:  16 17 18   Height:      Weight: 49.85 kg (109 lb 14.4 oz)     SpO2:  94% 94% 99%    Intake/Output Summary (Last 24 hours) at 02/07/16  1439 Last data filed at 02/07/16 1300  Gross per 24 hour  Intake    560 ml  Output   1550 ml  Net   -990 ml   Filed Weights   02/03/16 2200 02/06/16 0530 02/07/16 0445  Weight: 52.5 kg (115 lb 11.9 oz) 50.621 kg (111 lb 9.6 oz) 49.85 kg (109 lb 14.4 oz)    Examination:  General: A/O 4, sitting in Bed comfortably, No acute respiratory distress Eyes: negative scleral hemorrhage, negative anisocoria, negative icterus ENT: Negative Runny nose, negative gingival bleeding, Neck:  Negative scars, masses, torticollis, lymphadenopathy, JVD Lungs: Clear to auscultation bilaterally without wheezes or crackles Cardiovascular: Regular rhythm and rate, without murmur gallop or rub normal S1 and S2 Abdomen: negative abdominal pain, nondistended, positive soft, bowel sounds, no rebound, no ascites, no appreciable mass Extremities: No significant cyanosis, clubbing, or edema bilateral lower extremities Skin: Negative rashes, lesions, ulcers Psychiatric:  Negative depression, negative anxiety, negative fatigue, negative mania  Central nervous system:  Cranial nerves II through XII intact, tongue/uvula midline, all extremities muscle strength 5/5, sensation intact throughout, negative dysarthria, negative expressive aphasia, negative receptive aphasia.  .     Data Reviewed: Care during the described time interval was provided by me .  I have reviewed this patient's available data, including medical history, events of note, physical examination, and all test results as part of my evaluation. I have personally reviewed and interpreted all radiology studies.  CBC:  Recent Labs Lab 02/03/16 1306 02/04/16 0350 02/05/16 0328 02/07/16 0422  WBC 9.0 9.0 7.7 6.0  HGB 12.3 11.7* 10.9* 11.3*  HCT 36.7 35.7* 33.7* 34.4*  MCV 86.8 87.7 86.9 87.8  PLT 164 119* 143* [id number]   Basic Metabolic Panel:  Recent Labs Lab 02/03/16 1306 02/03/16 1743 02/04/16 0350 02/05/16 0328 02/06/16 0326 02/07/16 0422   NA 134*  --  132* 132* 130* 133*  K 4.0  --  3.8 3.3* 3.8 3.8  CL 105  --  104 104 102 102  CO2 21*  --  20* 22 22 24   GLUCOSE 99  --  112* 95 93 68  BUN 8  --  6 8 10 10   CREATININE 0.94  --  0.80 0.82 0.73 0.70  CALCIUM 9.2  --  8.9 8.6* 8.7* 8.8*  MG  --  1.8  --  1.8 1.9 1.8   GFR: Estimated Creatinine Clearance: 42.7 mL/min (by C-G formula based on Cr of 0.7). Liver Function Tests:  Recent Labs Lab 02/05/16 0328  AST 43*  ALT 26  ALKPHOS 70  BILITOT 2.8*  PROT 7.8  ALBUMIN 2.7*   No results for input(s): LIPASE, AMYLASE in the last 168 hours. No results for input(s): AMMONIA in the last 168 hours. Coagulation Profile: No results for input(s): INR, PROTIME in the last 168 hours. Cardiac Enzymes: No results for input(s): CKTOTAL, CKMB, CKMBINDEX, TROPONINI in the last 168 hours. BNP (last 3 results) No results for  input(s): PROBNP in the last 8760 hours. HbA1C: No results for input(s): HGBA1C in the last 72 hours. CBG: No results for input(s): GLUCAP in the last 168 hours. Lipid Profile: No results for input(s): CHOL, HDL, LDLCALC, TRIG, CHOLHDL, LDLDIRECT in the last 72 hours. Thyroid Function Tests: No results for input(s): TSH, T4TOTAL, FREET4, T3FREE, THYROIDAB in the last 72 hours. Anemia Panel: No results for input(s): VITAMINB12, FOLATE, FERRITIN, TIBC, IRON, RETICCTPCT in the last 72 hours. Urine analysis:    Component Value Date/Time   COLORURINE YELLOW 01/07/2013 1626   APPEARANCEUR CLEAR 01/07/2013 1626   LABSPEC 1.012 01/07/2013 1626   PHURINE 6.0 01/07/2013 1626   GLUCOSEU NEGATIVE 01/07/2013 1626   GLUCOSEU NEGATIVE 11/13/2010 1058   HGBUR NEGATIVE 01/07/2013 1626   BILIRUBINUR NEGATIVE 01/07/2013 1626   KETONESUR NEGATIVE 01/07/2013 1626   PROTEINUR NEGATIVE 01/07/2013 1626   UROBILINOGEN 0.2 01/07/2013 1626   NITRITE POSITIVE* 01/07/2013 1626   LEUKOCYTESUR TRACE* 01/07/2013 1626   Sepsis  Labs: @LABRCNTIP (procalcitonin:4,lacticidven:4)  ) Recent Results (from the past 240 hour(s))  MRSA PCR Screening     Status: None   Collection Time: 02/03/16  7:05 PM  Result Value Ref Range Status   MRSA by PCR NEGATIVE NEGATIVE Final    Comment:        The GeneXpert MRSA Assay (FDA approved for NASAL specimens only), is one component of a comprehensive MRSA colonization surveillance program. It is not intended to diagnose MRSA infection nor to guide or monitor treatment for MRSA infections.          Radiology Studies: Dg Chest Port 1 View  02/07/2016  CLINICAL DATA:  Pleural effusion EXAM: PORTABLE CHEST 1 VIEW COMPARISON:  02/04/2016 FINDINGS: Cardiac shadow is stable. Persistent right basilar infiltrate is seen. The left lung remains clear. No acute bony abnormality is noted IMPRESSION: Persistent right basilar infiltrate. Electronically Signed   By: Inez Catalina M.D.   On: 02/07/2016 08:09        Scheduled Meds: . azaTHIOprine  50 mg Oral Daily  . diltiazem  120 mg Oral Daily  . levothyroxine  75 mcg Oral QAC breakfast  . losartan  50 mg Oral Daily  . magnesium oxide  200 mg Oral Once  . metoprolol tartrate  12.5 mg Oral BID  . potassium chloride  20 mEq Oral Once  . Rivaroxaban  15 mg Oral Q supper  . sodium chloride flush  3 mL Intravenous Q12H   Continuous Infusions: . sodium chloride       LOS: 2 days    Time spent: 40 minutes    WOODS, Geraldo Docker, MD [facility] Pager [phone]   If 7PM-7AM, please contact night-coverage [email or web address] Password [facility] 02/07/2016, 2:39 PM

## 2016-02-07 NOTE — Progress Notes (Signed)
Occupational Therapy Treatment Patient Details Name: Michele Diaz MRN: [id number] DOB: [birth year] Today's Date: 02/07/2016    History of present illness Pt is a 82 y/o F brought to the ED by EMS reporting a several week h/o fatigue and poor apetitie w/ fluttering in chest and SOB.  Pt was in a-fib.  Pt's PMH includes asthma, depression, hepatitis, fibromyalgia, chronic back pain, anxiety, Rt breast lumpectomy (breast cancer), stroke.   OT comments  Pt requiring supervision for safety with ADL and mobility. Insists on going home. Agreeable to using walker upon discharge. Pt also stating she will not shower without assistance and keep her phone on her at all times.   Follow Up Recommendations  Home health OT;Supervision/Assistance - 24 hour (pt is adamantly denying SNF)    Equipment Recommendations  None recommended by OT    Recommendations for Other Services      Precautions / Restrictions Precautions Precautions: Fall       Mobility Bed Mobility Overal bed mobility: Modified Independent Bed Mobility: Supine to Sit              Transfers Overall transfer level: Needs assistance Equipment used: Rolling walker (2 wheeled) Transfers: Sit to/from Stand Sit to Stand: Supervision         General transfer comment: no physical assist, cues to stand momentarily before walking    Balance Overall balance assessment: Needs assistance   Sitting balance-Leahy Scale: Good       Standing balance-Leahy Scale: Fair                     ADL Overall ADL's : Needs assistance/impaired     Grooming: Wash/dry hands;Standing;Supervision/safety           Upper Body Dressing : Set up;Sitting   Lower Body Dressing: Supervision/safety;Sit to/from stand   Toilet Transfer: Chartered certified accountant and Hygiene: Supervision/safety;Sit to/from Nurse, children's Details (indicate cue type and reason):  pt agreed to having supervision when showering at home Functional mobility during ADLs: Supervision/safety;Rolling walker        Vision                     Perception     Praxis      Cognition   Behavior During Therapy: [facility] for tasks assessed/performed Overall Cognitive Status: Within Functional Limits for tasks assessed                       Extremity/Trunk Assessment               Exercises     Shoulder Instructions       General Comments      Pertinent Vitals/ Pain       Pain Assessment: No/denies pain  Home Living                                          Prior Functioning/Environment              Frequency Min 2X/week     Progress Toward Goals  OT Goals(current goals can now be found in the care plan section)  Progress towards OT goals: Progressing toward goals  Acute Rehab OT Goals Patient Stated Goal: to get stronger Time For Goal Achievement: 02/12/16 Potential to Achieve Goals: Good  Plan  Discharge plan needs to be updated    Co-evaluation                 End of Session Equipment Utilized During Treatment: Rolling walker;Gait belt   Activity Tolerance Patient tolerated treatment well   Patient Left in chair;with call bell/phone within reach;with chair alarm set   Nurse Communication          Time: [id number] OT Time Calculation (min): 29 min  Charges: OT General Charges $OT Visit: 1 Procedure OT Treatments $Self Care/Home Management : 23-37 mins  Malka So 02/07/2016, 4:36 PM  [phone]

## 2016-02-07 NOTE — Progress Notes (Signed)
   Subjective:  Clinically improved maintaining NSR after successful TEE DCCV yesterday  Objective:  Temp:  [97.5 F (36.4 C)-98.5 F (36.9 C)] 98.2 F (36.8 C) (05/25 0827) Pulse Rate:  [68-101] 85 (05/25 0827) Resp:  [16-28] 17 (05/25 0827) BP: (92-154)/(49-96) 132/89 mmHg (05/25 0827) SpO2:  [94 %-100 %] 94 % (05/25 0827) Weight:  [109 lb 14.4 oz (49.85 kg)] 109 lb 14.4 oz (49.85 kg) (05/25 0445) Weight change: -1 lb 11.2 oz (-0.771 kg)  Intake/Output from previous day: 05/24 0701 - 05/25 0700 In: 240 [P.O.:240] Out: 2050 [Urine:2050]  Intake/Output from this shift: Total I/O In: 200 [P.O.:200] Out: -   Physical Exam: General appearance: alert and no distress Neck: no adenopathy, no carotid bruit, no JVD, supple, symmetrical, trachea midline and thyroid not enlarged, symmetric, no tenderness/mass/nodules Lungs: clear to auscultation bilaterally Heart: regular rate and rhythm, S1, S2 normal, no murmur, click, rub or gallop Extremities: extremities normal, atraumatic, no cyanosis or edema  Lab Results: Results for orders placed or performed during the hospital encounter of 02/03/16 (from the past 48 hour(s))  Basic metabolic panel     Status: Abnormal   Collection Time: 02/06/16  3:26 AM  Result Value Ref Range   Sodium 130 (L) 135 - 145 mmol/L   Potassium 3.8 3.5 - 5.1 mmol/L   Chloride 102 101 - 111 mmol/L   CO2 22 22 - 32 mmol/L   Glucose, Bld 93 65 - 99 mg/dL   BUN 10 6 - 20 mg/dL   Creatinine, Ser 0.73 0.44 - 1.00 mg/dL   Calcium 8.7 (L) 8.9 - 10.3 mg/dL   GFR calc non Af Amer >60 >60 mL/min   GFR calc Af Amer >60 >60 mL/min    Comment: (NOTE) The eGFR has been calculated using the CKD EPI equation. This calculation has not been validated in all clinical situations. eGFR's persistently <60 mL/min signify possible Chronic Kidney Disease.    Anion gap 6 5 - 15  Magnesium     Status: None   Collection Time: 02/06/16  3:26 AM  Result Value Ref Range   Magnesium 1.9 1.7 - 2.4 mg/dL  Basic metabolic panel     Status: Abnormal   Collection Time: 02/07/16  4:22 AM  Result Value Ref Range   Sodium 133 (L) 135 - 145 mmol/L   Potassium 3.8 3.5 - 5.1 mmol/L   Chloride 102 101 - 111 mmol/L   CO2 24 22 - 32 mmol/L   Glucose, Bld 68 65 - 99 mg/dL   BUN 10 6 - 20 mg/dL   Creatinine, Ser 0.70 0.44 - 1.00 mg/dL   Calcium 8.8 (L) 8.9 - 10.3 mg/dL   GFR calc non Af Amer >60 >60 mL/min   GFR calc Af Amer >60 >60 mL/min    Comment: (NOTE) The eGFR has been calculated using the CKD EPI equation. This calculation has not been validated in all clinical situations. eGFR's persistently <60 mL/min signify possible Chronic Kidney Disease.    Anion gap 7 5 - 15  Magnesium     Status: None   Collection Time: 02/07/16  4:22 AM  Result Value Ref Range   Magnesium 1.8 1.7 - 2.4 mg/dL  CBC     Status: Abnormal   Collection Time: 02/07/16  4:22 AM  Result Value Ref Range   WBC 6.0 4.0 - 10.5 K/uL   RBC 3.92 3.87 - 5.11 MIL/uL   Hemoglobin 11.3 (L) 12.0 - 15.0 g/dL     HCT 34.4 (L) 36.0 - 46.0 %   MCV 87.8 78.0 - 100.0 fL   MCH 28.8 26.0 - 34.0 pg   MCHC 32.8 30.0 - 36.0 g/dL   RDW 13.9 11.5 - 15.5 %   Platelets 203 150 - 400 K/uL    Imaging: Imaging results have been reviewed  Tele- NSR 70s  Assessment/Plan:   1. Principal Problem: 2.   Atrial fibrillation with rapid ventricular response ([place]) 3. Active Problems: 4.   Hypothyroidism 5.   Essential hypertension 6.   GERD 7.   Polymyalgia rheumatica ([place]) 8.   VERTIGO 9.   BREAST CANCER, HX OF 10.   Prolonged Q-T interval on ECG 11.   Dyspnea 12.   New onset a-fib ([place]) 13.   Hypokalemia 14.   Hypomagnesemia 15.   Fall 16.   Atrial fibrillation with RVR (HCC) 17.   Time Spent Directly with Patient:  20 minutes  Length of Stay:  LOS: 2 days   Successful TEE DCCV yesterday. NSR this am. Feeling clinically improved. Still on IV dilt which can be transitioned to PO long acting  Cardizem. Ambulate today. Prom home AM. ROV with Dr. Stanford Breed.  Quay Burow 02/07/2016, 11:36 AM

## 2016-02-07 NOTE — Clinical Social Work Note (Signed)
Clinical Social Worker received referral for possible ST-SNF placement.  Chart reviewed.  PT/OT recommending home with home health with 24 hour support vs. SNF placement.  CSW spoke at length with patient at bedside who adamantly refuses SNF placement and will only discharge home.  CSW requested to speak with patient family to confirm discharge plans, however patient refused and requested that no one reach out to family regarding discharge needs.  Patient states that her sister, Otelia Sergeant will provide transportation at discharge and is agreeable to home health including a Education officer, museum.  Spoke with RN Case Manager who will follow up with patient to discuss home health needs.    CSW signing off - please re consult if social work needs arise.  Barbette Or, [place]

## 2016-02-07 NOTE — Progress Notes (Signed)
Physical Therapy Treatment Patient Details Name: Michele Diaz MRN: [id number] DOB: 1932/04/02 Today's Date: 03/06/16    History of Present Illness Pt is a 82 y/o F brought to the ED by EMS reporting a several week h/o fatigue and poor apetitie w/ fluttering in chest and SOB.  Pt was in a-fib.  Pt's PMH includes asthma, depression, hepatitis, fibromyalgia, chronic back pain, anxiety, Rt breast lumpectomy (breast cancer), stroke.    PT Comments    Pt progressing, quite pleasant and cooperative but too fatigued to amb this am; agreeable to therapeutic ex;  Continue to recommend SNF unless has 24hr support at home; will follow; HR 90-92 during ex's  Follow Up Recommendations  SNF;Supervision for mobility/OOB (unless family able to provide 24hr supervision)     Equipment Recommendations  None recommended by PT    Recommendations for Other Services       Precautions / Restrictions Precautions Precautions: Fall Precaution Comments: monitor O2 and HR Restrictions Weight Bearing Restrictions: No    Mobility  Bed Mobility               General bed mobility comments: pt politely declined OOB d/t fatigue  Transfers                    Ambulation/Gait                 Stairs            Wheelchair Mobility    Modified Rankin (Stroke Patients Only)       Balance                                    Cognition Arousal/Alertness: Awake/alert Behavior During Therapy: WFL for tasks assessed/performed Overall Cognitive Status: Within Functional Limits for tasks assessed                      Exercises General Exercises - Lower Extremity Ankle Circles/Pumps: AROM;Both;15 reps;Supine Quad Sets: AROM;Strengthening;Both;15 reps;Supine Gluteal Sets: Strengthening;Both;15 reps;Supine Heel Slides: AROM;Both;10 reps;Supine Hip ABduction/ADduction: AROM;Strengthening;Both;15 reps;Supine Straight Leg Raises: Strengthening;AROM;Both;15  reps;Supine    General Comments        Pertinent Vitals/Pain Pain Assessment: No/denies pain    Home Living                      Prior Function            PT Goals (current goals can now be found in the care plan section) Acute Rehab PT Goals Patient Stated Goal: to get stronger PT Goal Formulation: With patient Time For Goal Achievement: 02/18/16 Potential to Achieve Goals: Good Progress towards PT goals: Progressing toward goals    Frequency  Min 3X/week    PT Plan Current plan remains appropriate    Co-evaluation             End of Session   Activity Tolerance: Patient tolerated treatment well Patient left: in bed;with call bell/phone within reach;with bed alarm set     Time: [id number] PT Time Calculation (min) (ACUTE ONLY): 21 min  Charges:  $Therapeutic Exercise: 8-22 mins                    G Codes:      Michele Diaz 2016/03/06, 9:24 AM

## 2016-02-08 ENCOUNTER — Telehealth: Payer: Self-pay

## 2016-02-08 DIAGNOSIS — E038 Other specified hypothyroidism: Secondary | ICD-10-CM | POA: Diagnosis not present

## 2016-02-08 DIAGNOSIS — R791 Abnormal coagulation profile: Secondary | ICD-10-CM | POA: Diagnosis not present

## 2016-02-08 DIAGNOSIS — R42 Dizziness and giddiness: Secondary | ICD-10-CM | POA: Diagnosis not present

## 2016-02-08 DIAGNOSIS — I4891 Unspecified atrial fibrillation: Secondary | ICD-10-CM | POA: Diagnosis not present

## 2016-02-08 DIAGNOSIS — J90 Pleural effusion, not elsewhere classified: Secondary | ICD-10-CM | POA: Diagnosis not present

## 2016-02-08 DIAGNOSIS — D649 Anemia, unspecified: Secondary | ICD-10-CM | POA: Diagnosis not present

## 2016-02-08 DIAGNOSIS — I4581 Long QT syndrome: Secondary | ICD-10-CM | POA: Diagnosis not present

## 2016-02-08 DIAGNOSIS — R2681 Unsteadiness on feet: Secondary | ICD-10-CM | POA: Diagnosis not present

## 2016-02-08 DIAGNOSIS — M6281 Muscle weakness (generalized): Secondary | ICD-10-CM | POA: Diagnosis not present

## 2016-02-08 DIAGNOSIS — F341 Dysthymic disorder: Secondary | ICD-10-CM | POA: Diagnosis not present

## 2016-02-08 DIAGNOSIS — M353 Polymyalgia rheumatica: Secondary | ICD-10-CM | POA: Diagnosis not present

## 2016-02-08 DIAGNOSIS — D638 Anemia in other chronic diseases classified elsewhere: Secondary | ICD-10-CM | POA: Diagnosis not present

## 2016-02-08 DIAGNOSIS — Z9181 History of falling: Secondary | ICD-10-CM | POA: Diagnosis not present

## 2016-02-08 DIAGNOSIS — E034 Atrophy of thyroid (acquired): Secondary | ICD-10-CM | POA: Diagnosis not present

## 2016-02-08 DIAGNOSIS — I10 Essential (primary) hypertension: Secondary | ICD-10-CM | POA: Diagnosis not present

## 2016-02-08 DIAGNOSIS — Z8673 Personal history of transient ischemic attack (TIA), and cerebral infarction without residual deficits: Secondary | ICD-10-CM | POA: Diagnosis not present

## 2016-02-08 MED ORDER — LEVOTHYROXINE SODIUM 75 MCG PO TABS: 75.0000 ug | ORAL_TABLET | Freq: Every day | ORAL | Status: DC

## 2016-02-08 MED ORDER — RIVAROXABAN 15 MG PO TABS
15.0000 mg | ORAL_TABLET | Freq: Every day | ORAL | Status: DC
Start: 2016-02-08 — End: 2016-03-11

## 2016-02-08 MED ORDER — METOPROLOL TARTRATE 25 MG PO TABS: 12.5000 mg | ORAL_TABLET | Freq: Two times a day (BID) | ORAL | Status: DC

## 2016-02-08 MED ORDER — MECLIZINE HCL 12.5 MG PO TABS: 12.5000 mg | ORAL_TABLET | Freq: Two times a day (BID) | ORAL | Status: DC | PRN

## 2016-02-08 MED ORDER — LOSARTAN POTASSIUM 50 MG PO TABS: 50.0000 mg | ORAL_TABLET | Freq: Every day | ORAL | Status: DC

## 2016-02-08 MED ORDER — TRIAMCINOLONE ACETONIDE 0.1 % EX CREA: 1.0000 "application " | TOPICAL_CREAM | Freq: Four times a day (QID) | CUTANEOUS | Status: DC

## 2016-02-08 MED ORDER — DILTIAZEM HCL ER COATED BEADS 120 MG PO CP24: 120.0000 mg | ORAL_CAPSULE | Freq: Every day | ORAL | Status: DC

## 2016-02-08 NOTE — Clinical Social Work Placement (Signed)
   CLINICAL SOCIAL WORK PLACEMENT  NOTE  Date:  02/08/2016  Patient Details  Name: [name] MRN: [id number] Date of Birth: 01-Dec-1931  Clinical Social Work is seeking post-discharge placement for this patient at the [place] level of care (*CSW will initial, date and re-position this form in  chart as items are completed):  Yes   Patient/family provided with [facility] Work Department's list of facilities offering this level of care within the geographic area requested by the patient (or if unable, by the patient's family).  Yes   Patient/family informed of their freedom to choose among providers that offer the needed level of care, that participate in Medicare, Medicaid or managed care program needed by the patient, have an available bed and are willing to accept the patient.  Yes   Patient/family informed of [facility]'s ownership interest in [facility] and [facility], as well as of the fact that they are under no obligation to receive care at these facilities.  PASRR submitted to EDS on 02/07/16     PASRR number received on 02/07/16     Existing PASRR number confirmed on       FL2 transmitted to all facilities in geographic area requested by pt/family on 02/07/16     FL2 transmitted to all facilities within larger geographic area on       Patient informed that his/her managed care company has contracts with or will negotiate with certain facilities, including the following:        Yes   Patient/family informed of bed offers received.  Patient chooses bed at [facility]     Physician recommends and patient chooses bed at      Patient to be transferred to [facility] on 02/08/16.  Patient to be transferred to facility by Car     Patient family notified on 02/08/16 of transfer.  Name of family member notified:  Jeneen Rinks     PHYSICIAN       Additional Comment:     _______________________________________________ Benard Halsted, [place] 02/08/2016, 12:27 PM

## 2016-02-08 NOTE — Progress Notes (Signed)
02/08/16 Update: Patient now agreeable to SNF and would like [facility]. They are able to take her today. Son at bedside.     CSW received consult regarding PT recommendation of SNF at discharge.  Patient is refusing SNF.  CSW signing off.   Percell Locus Glenora Morocho LCSWA [phone]

## 2016-02-08 NOTE — Discharge Instructions (Signed)
Information on my medicine - XARELTO (Rivaroxaban)  This medication education was reviewed with me or my healthcare representative as part of my discharge preparation.    Why was Xarelto prescribed for you? Xarelto was prescribed for you to reduce the risk of a blood clot forming that can cause a stroke if you have a medical condition called atrial fibrillation (a type of irregular heartbeat).  What do you need to know about xarelto ? Take your Xarelto ONCE DAILY at the same time every day with your evening meal. If you have difficulty swallowing the tablet whole, you may crush it and mix in applesauce just prior to taking your dose.  Take Xarelto exactly as prescribed by your doctor and DO NOT stop taking Xarelto without talking to the doctor who prescribed the medication.  Stopping without other stroke prevention medication to take the place of Xarelto may increase your risk of developing a clot that causes a stroke.  Refill your prescription before you run out.  After discharge, you should have regular check-up appointments with your healthcare provider that is prescribing your Xarelto.  In the future your dose may need to be changed if your kidney function or weight changes by a significant amount.  What do you do if you miss a dose? If you are taking Xarelto ONCE DAILY and you miss a dose, take it as soon as you remember on the same day then continue your regularly scheduled once daily regimen the next day. Do not take two doses of Xarelto at the same time or on the same day.   Important Safety Information A possible side effect of Xarelto is bleeding. You should call your healthcare provider right away if you experience any of the following: ? Bleeding from an injury or your nose that does not stop. ? Unusual colored urine (red or dark brown) or unusual colored stools (red or black). ? Unusual bruising for unknown reasons. ? A serious fall or if you hit your head (even if  there is no bleeding).  Some medicines may interact with Xarelto and might increase your risk of bleeding while on Xarelto. To help avoid this, consult your healthcare provider or pharmacist prior to using any new prescription or non-prescription medications, including herbals, vitamins, non-steroidal anti-inflammatory drugs (NSAIDs) and supplements.  This website has more information on Xarelto: [email or web address]  Atrial Fibrillation Atrial fibrillation is a type of irregular or rapid heartbeat (arrhythmia). In atrial fibrillation, the heart quivers continuously in a chaotic pattern. This occurs when parts of the heart receive disorganized signals that make the heart unable to pump blood normally. This can increase the risk for stroke, heart failure, and other heart-related conditions. There are different types of atrial fibrillation, including:  Paroxysmal atrial fibrillation. This type starts suddenly, and it usually stops on its own shortly after it starts.  Persistent atrial fibrillation. This type often lasts longer than a week. It may stop on its own or with treatment.  Long-lasting persistent atrial fibrillation. This type lasts longer than 12 months.  Permanent atrial fibrillation. This type does not go away. Talk with your health care provider to learn about the type of atrial fibrillation that you have. CAUSES This condition is caused by some heart-related conditions or procedures, including:  A heart attack.  Coronary artery disease.  Heart failure.  Heart valve conditions.  High blood pressure.  Inflammation of the sac that surrounds the heart (pericarditis).  Heart surgery.  Certain heart rhythm disorders, such as Wolf-Parkinson-White syndrome.  Other causes include:  Pneumonia.  Obstructive sleep apnea.  Blockage of an artery in the lungs (pulmonary embolism, or PE).  Lung cancer.  Chronic lung disease.  Thyroid problems, especially if the thyroid is  overactive (hyperthyroidism).  Caffeine.  Excessive alcohol use or illegal drug use.  Use of some medicines, including certain decongestants and diet pills. Sometimes, the cause cannot be found. RISK FACTORS This condition is more likely to develop in:  People who are older in age.  People who smoke.  People who have diabetes mellitus.  People who are overweight (obese).  Athletes who exercise vigorously. SYMPTOMS Symptoms of this condition include:  A feeling that your heart is beating rapidly or irregularly.  A feeling of discomfort or pain in your chest.  Shortness of breath.  Sudden light-headedness or weakness.  Getting tired easily during exercise. In some cases, there are no symptoms. DIAGNOSIS Your health care provider may be able to detect atrial fibrillation when taking your pulse. If detected, this condition may be diagnosed with:  An electrocardiogram (ECG).  A Holter monitor test that records your heartbeat patterns over a 24-hour period.  Transthoracic echocardiogram (TTE) to evaluate how blood flows through your heart.  Transesophageal echocardiogram (TEE) to view more detailed images of your heart.  A stress test.  Imaging tests, such as a CT scan or chest X-ray.  Blood tests. TREATMENT The main goals of treatment are to prevent blood clots from forming and to keep your heart beating at a normal rate and rhythm. The type of treatment that you receive depends on many factors, such as your underlying medical conditions and how you feel when you are experiencing atrial fibrillation. This condition may be treated with:  Medicine to slow down the heart rate, bring the heart's rhythm back to normal, or prevent clots from forming.  Electrical cardioversion. This is a procedure that resets your heart's rhythm by delivering a controlled, low-energy shock to the heart through your skin.  Different types of ablation, such as catheter ablation, catheter  ablation with pacemaker, or surgical ablation. These procedures destroy the heart tissues that send abnormal signals. When the pacemaker is used, it is placed under your skin to help your heart beat in a regular rhythm. HOME CARE INSTRUCTIONS  Take over-the counter and prescription medicines only as told by your health care provider.  If your health care provider prescribed a blood-thinning medicine (anticoagulant), take it exactly as told. Taking too much blood-thinning medicine can cause bleeding. If you do not take enough blood-thinning medicine, you will not have the protection that you need against stroke and other problems.  Do not use tobacco products, including cigarettes, chewing tobacco, and e-cigarettes. If you need help quitting, ask your health care provider.  If you have obstructive sleep apnea, manage your condition as told by your health care provider.  Do not drink alcohol.  Do not drink beverages that contain caffeine, such as coffee, soda, and tea.  Maintain a healthy weight. Do not use diet pills unless your health care provider approves. Diet pills may make heart problems worse.  Follow diet instructions as told by your health care provider.  Exercise regularly as told by your health care provider.  Keep all follow-up visits as told by your health care provider. This is important. PREVENTION  Avoid drinking beverages that contain caffeine or alcohol.  Avoid certain medicines, especially medicines that are used for breathing problems.  Avoid certain herbs and herbal medicines, such as those that  contain ephedra or ginseng.  Do not use illegal drugs, such as cocaine and amphetamines.  Do not smoke.  Manage your high blood pressure. SEEK MEDICAL CARE IF:  You notice a change in the rate, rhythm, or strength of your heartbeat.  You are taking an anticoagulant and you notice increased bruising.  You tire more easily when you exercise or exert yourself. SEEK  IMMEDIATE MEDICAL CARE IF:  You have chest pain, abdominal pain, sweating, or weakness.  You feel nauseous.  You notice blood in your vomit, bowel movement, or urine.  You have shortness of breath.  You suddenly have swollen feet and ankles.  You feel dizzy.  You have sudden weakness or numbness of the face, arm, or leg, especially on one side of the body.  You have trouble speaking, trouble understanding, or both (aphasia).  Your face or your eyelid droops on one side. These symptoms may represent a serious problem that is an emergency. Do not wait to see if the symptoms will go away. Get medical help right away. Call your local emergency services (911 in the [place] Do not drive yourself to the hospital.   This information is not intended to replace advice given to you by your health care provider. Make sure you discuss any questions you have with your health care provider.   Document Released: 09/01/2005 Document Revised: 05/23/2015 Document Reviewed: 12/27/2014 Elsevier Interactive Patient Education [employer].

## 2016-02-08 NOTE — Discharge Summary (Signed)
DISCHARGE SUMMARY  Michele Diaz  MR#: [id number]  DOB:[birth year]  Date of Admission: 02/03/2016 Date of Discharge: 02/08/2016  Attending Physician:Michele Diaz T  Patient's WY:[id number] [name], MD  Consults: Michele Diaz  Disposition: D/c to SNF of choice for rehab stay   Follow-up Appts:     Follow-up Information    Follow up with Michele Deforest, PA On 02/14/2016.   Specialties:  Diaz, Radiology   Why:  3:00pm for your hospital follow up.   Contact information:   [address]  [place] [address] [phone]       Follow up with Michele Diaz .   Why:  Michele attending MD at your chose rehab Diaz will attend to your immediate medical needs.  Once you return home, you should schedule a follow up visit w/ Michele Diaz.        Tests Needing Follow-up: -f/u CXR to eval Michele RLL is suggested in 7-10 days  -recheck of TSH in 6-8 weeks -avoid all meds which may prolong QT  Discharge Diagnoses: Newly diagnosed Atrial Fibrillation with RVR R layering pleural effusion with associated atx Elevated D-dimer Prolonged QTc  Normocytic anemia Polymyalgia Rheumatica Hypertension Hx of CVA Hypothyroidism Vertigo Fall Hx of Anxiety  Initial presentation: 82 yo female with history of HTN, CVA, PMR, breast cancer, and hypothyroidism who was brought to ED by EMS w/ a several week history of fatigue and poor appetite. She had been having intermittent fluttering in chest over several days. EMS found Michele patient's heart rate in 180s She received 10mg  of IV Cardizem followed by 5 mg/hr drip. RN in ED discontinued gtt but it had to be restarted for recurrent afib with RVR.   Hospital Course:  Procedures: TTE - 5/22 - EF 55-60% - unable to comment on diastolic fxn  B LE venous duplex - 5/23 - no DVT  TEE / DCCV - 5/24 - EF normal - no WMA - mild to mod mitral regurg - RA dilated - R pleural effusion     Newly diagnosed Atrial  Fibrillation with RVR -Chadsvasc score is 6 - Xarelto initiated this admission - Cards directed care of this issue - successfully converted to NSR after TEE/DCCV   R layering pleural effusion with associated atx -likely simply due to RVR associated transient CHF - f/u CXR notes stability - no sx to suggest acute infection - f/u CXR in 7-10 days suggested, or earlier if develops sx to suggest infection    Elevated D-dimer TTE w/o evidence to suggest large PE - B LE venous duplex w/o evidence of DVT - no benefit to be gained by exposing pt to CTa contrast risk as she will remain on anticoag nonetheless    Prolonged QTc (647) -Mg+ and K+ normal - avoid QT prolonging medications - has improved on f/u EKGs  Normocytic anemia -no clear evidence of blood loss - newly placed on Xarelto - followed in serial fashion - Hgb climbing at time of d/c w/o evidence of bleeding   Polymyalgia Rheumatica -continue home Imuran - sx well controlled   Hypertension -BP controlled   Hx of CVA -previously on Plavix, but this was stopped by Dr. Sherral Diaz on 5/23 - with addition of Xarelto will cont to hold Plavix as use of both is felt to increase bleeding risk to an excessive extent    Hypothyroidism -TSH 0.15 suggesting overcorrection - reduced dose to 10mcg - FT4 and FT3 would not add to Michele  workup so I have canceled these labs - will need recheck of TSH in 6-8 weeks   Vertigo -continue home Antivert prn - controlled  Fall -fell at home 3 months ago - no preceding dizziness, did not trip, not clear if lost consciousness - no falls since -PT suggested rehab stay and pt has reluctantly accepted   Hx of Anxiety -continue home xanax prn    Medication List    STOP taking these medications        clopidogrel 75 MG tablet  Commonly known as:  PLAVIX     donepezil 5 MG tablet  Commonly known as:  ARICEPT     escitalopram 10 MG tablet  Commonly known as:  LEXAPRO     fexofenadine 180 MG tablet    Commonly known as:  ALLEGRA      TAKE these medications        ALPRAZolam 0.5 MG tablet  Commonly known as:  XANAX  Take 0.5-1 tablets (0.25-0.5 mg total) by mouth 2 (two) times daily as needed for anxiety or sleep.     azaTHIOprine 50 MG tablet  Commonly known as:  IMURAN  Take 1 tablet (50 mg total) by mouth daily.     cholecalciferol 1000 units tablet  Commonly known as:  VITAMIN D  Take 1 tablet (1,000 Units total) by mouth daily.     diltiazem 120 MG 24 hr capsule  Commonly known as:  CARDIZEM CD  Take 1 capsule (120 mg total) by mouth daily.     levothyroxine 75 MCG tablet  Commonly known as:  SYNTHROID, LEVOTHROID  Take 1 tablet (75 mcg total) by mouth daily before breakfast.     losartan 50 MG tablet  Commonly known as:  COZAAR  Take 1 tablet (50 mg total) by mouth daily.     meclizine 12.5 MG tablet  Commonly known as:  ANTIVERT  Take 1 tablet (12.5 mg total) by mouth 2 (two) times daily as needed for dizziness.     metoprolol tartrate 25 MG tablet  Commonly known as:  LOPRESSOR  Take 0.5 tablets (12.5 mg total) by mouth 2 (two) times daily.     pantoprazole 40 MG tablet  Commonly known as:  PROTONIX  Take 1 tablet (40 mg total) by mouth daily.     Rivaroxaban 15 MG Tabs tablet  Commonly known as:  XARELTO  Take 1 tablet (15 mg total) by mouth daily with supper.     triamcinolone cream 0.1 %  Commonly known as:  KENALOG  Apply 1 application topically 4 (four) times daily. Use on rash        Day of Discharge BP 134/83 mmHg  Pulse 87  Temp(Src) 97.7 F (36.5 C) (Oral)  Resp 18  Ht 5' 1.5" (1.562 m)  Wt 49.351 kg (108 lb 12.8 oz)  BMI 20.23 kg/m2  SpO2 95%  Physical Exam: General: No acute respiratory distress Lungs: Clear to auscultation bilaterally - no wheezes or crackles Cardiovascular: Regular rate and rhythm without murmur gallop or rub normal S1 and S2 Abdomen: Nontender, nondistended, soft, bowel sounds positive, no rebound, no  ascites, no appreciable mass Extremities: No significant cyanosis, clubbing, or edema bilateral lower extremities  Basic Metabolic Panel:  Recent Labs Lab 02/03/16 1306 02/03/16 1743 02/04/16 0350 02/05/16 0328 02/06/16 0326 02/07/16 0422  NA 134*  --  132* 132* 130* 133*  K 4.0  --  3.8 3.3* 3.8 3.8  CL 105  --  104 104 102  102  CO2 21*  --  20* 22 22 24   GLUCOSE 99  --  112* 95 93 68  BUN 8  --  6 8 10 10   CREATININE 0.94  --  0.80 0.82 0.73 0.70  CALCIUM 9.2  --  8.9 8.6* 8.7* 8.8*  MG  --  1.8  --  1.8 1.9 1.8    Liver Function Tests:  Recent Labs Lab 02/05/16 0328  AST 43*  ALT 26  ALKPHOS 70  BILITOT 2.8*  PROT 7.8  ALBUMIN 2.7*   CBC:  Recent Labs Lab 02/03/16 1306 02/04/16 0350 02/05/16 0328 02/07/16 0422  WBC 9.0 9.0 7.7 6.0  HGB 12.3 11.7* 10.9* 11.3*  HCT 36.7 35.7* 33.7* 34.4*  MCV 86.8 87.7 86.9 87.8  PLT 164 119* 143* 203   BNP (last 3 results)  Recent Labs  02/03/16 1306  BNP 305.3*    Recent Results (from Michele past 240 hour(s))  MRSA PCR Screening     Status: None   Collection Time: 02/03/16  7:05 PM  Result Value Ref Range Status   MRSA by PCR NEGATIVE NEGATIVE Final    Comment:        Michele GeneXpert MRSA Assay (FDA approved for NASAL specimens only), is one component of a comprehensive MRSA colonization surveillance program. It is not intended to diagnose MRSA infection nor to guide or monitor treatment for MRSA infections.      Time spent in discharge (includes decision making & examination of pt): >30 minutes  02/08/2016, 12:14 PM   Cherene Altes, MD [facility] Office  [phone] Pager [phone]  On-Call/Text Page:      [email or web address]      password [facility]

## 2016-02-08 NOTE — Telephone Encounter (Signed)
Pt was admitted for new onset a-fib and dc'ed: Disposition: D/c to SNF of choice for rehab stay

## 2016-02-08 NOTE — Progress Notes (Signed)
Subjective:  No CP/SOB. Maintaining NSR  Objective:  Temp:  [97.7 F (36.5 C)] 97.7 F (36.5 C) (05/25 2117) Pulse Rate:  [78-87] 87 (05/25 2117) Resp:  [18] 18 (05/25 1345) BP: (134-140)/(80-83) 134/83 mmHg (05/25 2117) SpO2:  [95 %-99 %] 95 % (05/25 2117) Weight:  [108 lb 12.8 oz (49.351 kg)] 108 lb 12.8 oz (49.351 kg) (05/26 0400) Weight change: -1 lb 1.6 oz (-0.499 kg)  Intake/Output from previous day: 05/25 0701 - 05/26 0700 In: 582.5 [P.O.:560; I.V.:22.5] Out: 300 [Urine:300]  Intake/Output from this shift: Total I/O In: 120 [P.O.:120] Out: -   Physical Exam: General appearance: alert and no distress Neck: no adenopathy, no carotid bruit, no JVD, supple, symmetrical, trachea midline and thyroid not enlarged, symmetric, no tenderness/mass/nodules Lungs: clear to auscultation bilaterally Heart: regular rate and rhythm, S1, S2 normal, no murmur, click, rub or gallop Extremities: extremities normal, atraumatic, no cyanosis or edema  Lab Results: Results for orders placed or performed during the hospital encounter of 02/03/16 (from the past 48 hour(s))  Basic metabolic panel     Status: Abnormal   Collection Time: 02/07/16  4:22 AM  Result Value Ref Range   Sodium 133 (L) 135 - 145 mmol/L   Potassium 3.8 3.5 - 5.1 mmol/L   Chloride 102 101 - 111 mmol/L   CO2 24 22 - 32 mmol/L   Glucose, Bld 68 65 - 99 mg/dL   BUN 10 6 - 20 mg/dL   Creatinine, Ser 0.70 0.44 - 1.00 mg/dL   Calcium 8.8 (L) 8.9 - 10.3 mg/dL   GFR calc non Af Amer >60 >60 mL/min   GFR calc Af Amer >60 >60 mL/min    Comment: (NOTE) The eGFR has been calculated using the CKD EPI equation. This calculation has not been validated in all clinical situations. eGFR's persistently <60 mL/min signify possible Chronic Kidney Disease.    Anion gap 7 5 - 15  Magnesium     Status: None   Collection Time: 02/07/16  4:22 AM  Result Value Ref Range   Magnesium 1.8 1.7 - 2.4 mg/dL  CBC     Status:  Abnormal   Collection Time: 02/07/16  4:22 AM  Result Value Ref Range   WBC 6.0 4.0 - 10.5 K/uL   RBC 3.92 3.87 - 5.11 MIL/uL   Hemoglobin 11.3 (L) 12.0 - 15.0 g/dL   HCT 34.4 (L) 36.0 - 46.0 %   MCV 87.8 78.0 - 100.0 fL   MCH 28.8 26.0 - 34.0 pg   MCHC 32.8 30.0 - 36.0 g/dL   RDW 13.9 11.5 - 15.5 %   Platelets 203 150 - 400 K/uL    Imaging: Imaging results have been reviewed   Tele- NSR Assessment/Plan:   1. Principal Problem: 2.   Atrial fibrillation with rapid ventricular response ([place]) 3. Active Problems: 4.   Hypothyroidism 5.   Essential hypertension 6.   GERD 7.   Polymyalgia rheumatica ([place]) 8.   VERTIGO 9.   BREAST CANCER, HX OF 10.   Prolonged Q-T interval on ECG 11.   Dyspnea 12.   New onset a-fib ([place]) 13.   Hypokalemia 14.   Hypomagnesemia 15.   Fall 16.   Atrial fibrillation with RVR ([facility]) 17.   Time Spent Directly with Patient:  20 minutes  Length of Stay:  LOS: 3 days   Maintaining NSR on Xarelto s/p TEE DCCV. Nl LV fxn. I have ordered Free T3/T4. The only loose end  is the slightly elevated D- Dimer (1.19) on admission with right sided atelectasis and small Right pleural effusion. ? PE. Marland Kitchen CTA was not performed but she is already on NOAC so it probably is a moot point. OK for DC from cards point of view.   Quay Burow 02/08/2016, 10:14 AM

## 2016-02-08 NOTE — Progress Notes (Signed)
Patient will DC to: [facility] Anticipated DC date: 02/08/16  Family notified: Son at [employer] by: Son will take by car   Per MD patient ready for DC to [facility]. RN, patient, patient's family, and facility notified of DC. RN given number for report.   CSW signing off.  Cedric Fishman, [place] Social Worker [phone]

## 2016-02-12 ENCOUNTER — Non-Acute Institutional Stay (SKILLED_NURSING_FACILITY): Payer: Medicare Other | Admitting: Internal Medicine

## 2016-02-12 ENCOUNTER — Encounter: Payer: Self-pay | Admitting: Internal Medicine

## 2016-02-12 DIAGNOSIS — F341 Dysthymic disorder: Secondary | ICD-10-CM

## 2016-02-12 DIAGNOSIS — I4581 Long QT syndrome: Secondary | ICD-10-CM

## 2016-02-12 DIAGNOSIS — R7989 Other specified abnormal findings of blood chemistry: Secondary | ICD-10-CM

## 2016-02-12 DIAGNOSIS — E034 Atrophy of thyroid (acquired): Secondary | ICD-10-CM

## 2016-02-12 DIAGNOSIS — Z8673 Personal history of transient ischemic attack (TIA), and cerebral infarction without residual deficits: Secondary | ICD-10-CM | POA: Diagnosis not present

## 2016-02-12 DIAGNOSIS — M353 Polymyalgia rheumatica: Secondary | ICD-10-CM | POA: Diagnosis not present

## 2016-02-12 DIAGNOSIS — I10 Essential (primary) hypertension: Secondary | ICD-10-CM

## 2016-02-12 DIAGNOSIS — R42 Dizziness and giddiness: Secondary | ICD-10-CM

## 2016-02-12 DIAGNOSIS — E038 Other specified hypothyroidism: Secondary | ICD-10-CM | POA: Diagnosis not present

## 2016-02-12 DIAGNOSIS — I4891 Unspecified atrial fibrillation: Secondary | ICD-10-CM | POA: Diagnosis not present

## 2016-02-12 DIAGNOSIS — R9431 Abnormal electrocardiogram [ECG] [EKG]: Secondary | ICD-10-CM

## 2016-02-12 DIAGNOSIS — J90 Pleural effusion, not elsewhere classified: Secondary | ICD-10-CM

## 2016-02-12 DIAGNOSIS — R791 Abnormal coagulation profile: Secondary | ICD-10-CM

## 2016-02-12 DIAGNOSIS — D638 Anemia in other chronic diseases classified elsewhere: Secondary | ICD-10-CM | POA: Diagnosis not present

## 2016-02-12 NOTE — Progress Notes (Signed)
MRN: [id number] Name: Michele Diaz  Sex: female Age: 82 y.o. DOB: 1932/08/25  [place] #: [facility] Facility/Room:323 P Level Of Care: SNF Provider: Noah Delaine. Sheppard Coil, MD Emergency Contacts: Extended Emergency Contact Information Primary Emergency Contact: Domingo Dimes of [place] Mobile Phone: [phone] Relation: Son Secondary Emergency Contact: Celestia Khat [place] of [place] Phone: [phone] Relation: Sister  Code Status: Full Code  Allergies: Celecoxib; Codeine; Atorvastatin; Rosuvastatin; and Penicillins  Chief Complaint  Patient presents with  . New Admit To SNF    Admission to Facility    HPI: Patient is 82 y.o. female with HTN, CVA, PMR, breast cancer, and hypothyroidism who was brought to ED by EMS w/ a several week history of fatigue and poor appetite. She had been having intermittent fluttering in chest over several days. EMS found the patient's heart rate in 180s She received 10mg  of IV Cardizem followed by 5 mg/hr drip. Pt was admitted to [facility] from 5/21-26 where she was dx and treated for new onset AF with EVR which was converted. Pt was also dx with pleural effusion and elevated D dimer with neg w/u. Pt is admitted to SNF for generalized weakness. While at SNF, pt will be followed for  PMR, tx with imuran, HTN, tx with metoprolol, diltiazem and cozaar and anxiety, tx with xanax prn.  Past Medical History  Diagnosis Date  . Hypothyroid   . Asthma   . Hypertension   . GERD (gastroesophageal reflux disease)   . Depression   . Unspecified vitamin D deficiency   . Allergic rhinitis   . Diverticulitis of colon   . Hepatitis, autoimmune ([place])   . Herpes zoster 2011  . Weight loss 6/11  . Hyperlipidemia     statins increased LFTs  . PONV (postoperative nausea and vomiting)     "years ago" (01/07/2013)  . Giant cell arteritis ([place]) 2003    devashwar  . Temporal arteritis ([place])   . Daily headache     "related to temporal arteritis"  (01/07/2013)  . Arthritis     "hips, hands, feet; pretty much feels like all over" (01/07/2013)  . Fibromyalgia   . Chronic lower back pain   . Anxiety   . Breast cancer ([place]) ~ 2008    "right" (01/07/2013)  . Stroke [facility])     Past Surgical History  Procedure Laterality Date  . Appendectomy    . Cholecystectomy    . Tonsillectomy    . Abdominal hysterectomy  1988  . Dilation and curettage of uterus    . Breast biopsy Right   . Breast lumpectomy Right   . Cardioversion N/A 02/06/2016    Procedure: CARDIOVERSION;  Surgeon: Skeet Latch, MD;  Location: [place];  Service: Cardiovascular;  Laterality: N/A;  . Tee without cardioversion N/A 02/06/2016    Procedure: TRANSESOPHAGEAL ECHOCARDIOGRAM (TEE);  Surgeon: Skeet Latch, MD;  Location: [facility] ENDOSCOPY;  Service: Cardiovascular;  Laterality: N/A;      Medication List       This list is accurate as of: 02/12/16 11:59 PM.  Always use your most recent med list.               ALPRAZolam 0.5 MG tablet  Commonly known as:  XANAX  Take 0.5-1 tablets (0.25-0.5 mg total) by mouth 2 (two) times daily as needed for anxiety or sleep.     azaTHIOprine 50 MG tablet  Commonly known as:  IMURAN  Take 1 tablet (50 mg total) by mouth  daily.     cholecalciferol 1000 units tablet  Commonly known as:  VITAMIN D  Take 1 tablet (1,000 Units total) by mouth daily.     diltiazem 120 MG 24 hr capsule  Commonly known as:  CARDIZEM CD  Take 1 capsule (120 mg total) by mouth daily.     levothyroxine 75 MCG tablet  Commonly known as:  SYNTHROID, LEVOTHROID  Take 1 tablet (75 mcg total) by mouth daily before breakfast.     losartan 50 MG tablet  Commonly known as:  COZAAR  Take 1 tablet (50 mg total) by mouth daily.     meclizine 12.5 MG tablet  Commonly known as:  ANTIVERT  Take 1 tablet (12.5 mg total) by mouth 2 (two) times daily as needed for dizziness.     metoprolol tartrate 25 MG tablet  Commonly known as:  LOPRESSOR  Take  0.5 tablets (12.5 mg total) by mouth 2 (two) times daily.     pantoprazole 40 MG tablet  Commonly known as:  PROTONIX  Take 1 tablet (40 mg total) by mouth daily.     Rivaroxaban 15 MG Tabs tablet  Commonly known as:  XARELTO  Take 1 tablet (15 mg total) by mouth daily with supper.     triamcinolone cream 0.1 %  Commonly known as:  KENALOG  Apply 1 application topically 4 (four) times daily. Use on rash        No orders of the defined types were placed in this encounter.    Immunization History  Administered Date(s) Administered  . Influenza Whole 10/15/2007, 08/17/2009, 05/15/2010, 05/26/2011  . Influenza, High Dose Seasonal PF 08/22/2013, 09/28/2015  . Influenza, Seasonal, Injecte, Preservative Fre 08/31/2012  . Influenza,inj,Quad PF,36+ Mos 08/01/2014  . Pneumococcal Conjugate-13 03/20/2014  . Pneumococcal Polysaccharide-23 04/23/2006, 02/13/2015    Social History  Substance Use Topics  . Smoking status: Never Smoker   . Smokeless tobacco: Never Used  . Alcohol Use: No    Family history is   Family History  Problem Relation Age of Onset  . Diabetes Mother   . Cancer Brother   . Heart disease Brother   . Cancer Sister   . Heart disease Sister     MI at age 63  . Hypertension Other   . Heart attack Mother     died of MI at age 47      Review of Systems  DATA OBTAINED: from patient member GENERAL:  no fevers, fatigue, appetite changes SKIN: No itching, rash or wounds EYES: No eye pain, redness, discharge EARS: No earache, tinnitus, change in hearing NOSE: No congestion, drainage or bleeding  MOUTH/THROAT: No mouth or tooth pain, No sore throat RESPIRATORY: No cough, wheezing, SOB CARDIAC: No chest pain, palpitations, lower extremity edema  GI: No abdominal pain, No N/V/D or constipation, No heartburn or reflux  GU: No dysuria, frequency or urgency, or incontinence  MUSCULOSKELETAL: No unrelieved bone/joint pain NEUROLOGIC: No headache, dizziness or  focal weakness PSYCHIATRIC: No c/o anxiety or sadness   Filed Vitals:   02/12/16 0924  BP: 110/73  Pulse: 67  Temp: 97 F (36.1 C)  Resp: 20    SpO2 Readings from Last 1 Encounters:  02/07/16 95%        Physical Exam  GENERAL APPEARANCE: Alert, conversant,  No acute distress.  SKIN: No diaphoresis rash HEAD: Normocephalic, atraumatic  EYES: Conjunctiva/lids clear. Pupils round, reactive. EOMs intact.  EARS: External exam WNL, canals clear. Hearing grossly normal.  NOSE:  No deformity or discharge.  MOUTH/THROAT: Lips w/o lesions  RESPIRATORY: Breathing is even, unlabored. Lung sounds are clear   CARDIOVASCULAR: Heart RRR no murmurs, rubs or gallops. No peripheral edema.   GASTROINTESTINAL: Abdomen is soft, non-tender, not distended w/ normal bowel sounds. GENITOURINARY: Bladder non tender, not distended  MUSCULOSKELETAL: No abnormal joints or musculature NEUROLOGIC:  Cranial nerves 2-12 grossly intact. Moves all extremities  PSYCHIATRIC: Mood and affect appropriate to situation, no behavioral issues  Patient Active Problem List   Diagnosis Date Noted  . Pleural effusion 02/13/2016  . Positive D dimer 02/13/2016  . Anemia 02/13/2016  . H/O: CVA (cerebrovascular accident) 02/13/2016  . Prolonged Q-T interval on ECG 02/03/2016  . Atrial fibrillation with rapid ventricular response ([place]) 02/03/2016  . Cataract 03/20/2014  . Epistaxis 08/22/2013  . Wart viral 04/24/2013  . Actinic keratoses 04/22/2013  . Stroke ([place]) 01/07/2013  . Well adult exam 06/01/2012  . Headache(784.0) 03/02/2012  . Grief reaction 09/19/2011  . Memory deficit 05/26/2011  . Chest pain 04/24/2011  . Chronic hyponatremia 07/16/2010  . DEPRESSION/ANXIETY 04/03/2010  . Fatigue 03/06/2010  . WEIGHT LOSS 03/06/2010  . VERTIGO 10/23/2009  . LIVER FUNCTION TESTS, ABNORMAL, HX OF 08/17/2009  . CHEST WALL PAIN 04/06/2008  . GERD 10/15/2007  . Other specified acquired hypothyroidism 10/06/2007  .  Hypothyroidism 07/19/2007  . Essential hypertension 07/19/2007  . ALLERGIC RHINITIS 07/19/2007  . DIVERTICULOSIS, COLON 07/19/2007  . OSTEOARTHRITIS 07/19/2007  . BREAST CANCER, HX OF 07/19/2007  . VITAMIN D DEFICIENCY 06/09/2007  . Polymyalgia rheumatica (HCC) 06/09/2007       Component Value Date/Time   WBC 6.0 02/07/2016 0422   WBC 7.7 02/11/2011 1257   RBC 3.92 02/07/2016 0422   RBC 4.18 02/11/2011 1257   HGB 11.3* 02/07/2016 0422   HGB 13.2 02/11/2011 1257   HCT 34.4* 02/07/2016 0422   HCT 38.2 02/11/2011 1257   PLT 203 02/07/2016 0422   PLT 147 02/11/2011 1257   MCV 87.8 02/07/2016 0422   MCV 91.3 02/11/2011 1257   LYMPHSABS 0.7 08/22/2013 1406   LYMPHSABS 0.7* 02/11/2011 1257   MONOABS 0.6 08/22/2013 1406   MONOABS 0.6 02/11/2011 1257   EOSABS 0.2 08/22/2013 1406   EOSABS 0.2 02/11/2011 1257   BASOSABS 0.0 08/22/2013 1406   BASOSABS 0.0 02/11/2011 1257        Component Value Date/Time   NA 133* 02/07/2016 0422   K 3.8 02/07/2016 0422   CL 102 02/07/2016 0422   CO2 24 02/07/2016 0422   GLUCOSE 68 02/07/2016 0422   GLUCOSE 131* 09/18/2006 1420   BUN 10 02/07/2016 0422   CREATININE 0.70 02/07/2016 0422   CALCIUM 8.8* 02/07/2016 0422   PROT 7.8 02/05/2016 0328   ALBUMIN 2.7* 02/05/2016 0328   AST 43* 02/05/2016 0328   ALT 26 02/05/2016 0328   ALKPHOS 70 02/05/2016 0328   BILITOT 2.8* 02/05/2016 0328   GFRNONAA >60 02/07/2016 0422   GFRAA >60 02/07/2016 0422    Lab Results  Component Value Date   HGBA1C 5.3 01/08/2013    Lab Results  Component Value Date   CHOL 178 01/08/2013   HDL 58 01/08/2013   LDLCALC 75 01/08/2013   LDLDIRECT 125.9 10/06/2007   TRIG 226* 01/08/2013   CHOLHDL 3.1 01/08/2013     Dg Chest 2 View  02/03/2016  CLINICAL DATA:  Shortness of breath. EXAM: CHEST  2 VIEW COMPARISON:  January 08, 2013 FINDINGS: Cardiomegaly. Increased haziness over the right base consistent with a  layering effusion. There is associated opacity which  could be atelectasis. No overt edema. No other acute abnormalities. IMPRESSION: New right layering effusion with associated opacity, possibly atelectasis. Cardiomegaly with no overt edema. Electronically Signed   By: Dorise Bullion III M.D   On: 02/03/2016 14:21   Dg Chest Port 1 View  02/04/2016  CLINICAL DATA:  Dyspnea EXAM: PORTABLE CHEST 1 VIEW COMPARISON:  02/03/2016 FINDINGS: Progression of right lower lobe airspace disease and right effusion. Progression of mild left lower lobe atelectasis Pulmonary vascular congestion is present. Question mild fluid overload. IMPRESSION: Progression of bibasilar airspace disease, right greater than left. Progression of right effusion. Question congestive heart failure. Electronically Signed   By: Franchot Gallo M.D.   On: 02/04/2016 07:13    Not all labs, radiology exams or other studies done during hospitalization come through on my EPIC note; however they are reviewed by me.    Assessment and Plan  Atrial fibrillation with rapid ventricular response (HCC) Chadsvasc score is 6 - Xarelto initiated this admission - Cards directed care of this issue - successfully converted to NSR after TEE/DCCV  SNF- cont metoprolol, diltiazem and xarelto as prophylaxis  Pleural effusion R layering pleural effusion with associated atx -likely simply due to RVR associated transient CHF - f/u CXR notes stability - no sx to suggest acute infection - f/u CXR in 7-10 days suggested, or earlier if develops sx to suggest infection   Positive D dimer TTE w/o evidence to suggest large PE - B LE venous duplex w/o evidence of DVT - no benefit to be gained by exposing pt to CTa contrast risk as she will remain on anticoag nonetheless   Prolonged Q-T interval on ECG Mg+ and K+ normal - avoid QT prolonging medications - has improved on f/u EKGs SNF - will monitor K+ on BMP  Anemia no clear evidence of blood loss - newly placed on Xarelto - followed in serial fashion - Hgb  climbing at time of d/c w/o evidence of bleeding  SNF - will f/u CBC;d/c Hb 11.3  Polymyalgia rheumatica (HCC) SNF - continue home Imuran - sx well controlled   H/O: CVA (cerebrovascular accident) previously on Plavix, but this was stopped by [name] on 5/23 - with addition of Xarelto will cont to hold Plavix as use of both is felt to increase bleeding risk to an excessive extent  SNF - cont xarelto  Hypothyroidism TSH 0.15 suggesting overcorrection - reduced dose to 88mcg - FT4 and FT3 would not add to the workup so I have canceled these labs - will need recheck of TSH in 6-8 weeks  SNF - cont synthroid 75 mcg;repeat TSH 6 weeks  Essential hypertension SNF - controlled ;cont metoprolol 12.5 mg BID, cardizem 120 mg dailyand cozaar 50 mg daily  VERTIGO SNF - cont antivert prn   Pt wants to go home today and PT has saiD THAT SHE IS HIGH FUNCTIONING. Pt is d/c to home toay with HH/OT/PT/nursing. Medications have been reconcilled and Rx's written,   Time spent > 45 min;> 50% of time with patient was spent reviewing records, labs, tests and studies, counseling and developing plan of care  Noah Delaine. Sheppard Coil, MD

## 2016-02-13 ENCOUNTER — Telehealth: Payer: Self-pay | Admitting: Internal Medicine

## 2016-02-13 ENCOUNTER — Encounter: Payer: Self-pay | Admitting: Internal Medicine

## 2016-02-13 DIAGNOSIS — M797 Fibromyalgia: Secondary | ICD-10-CM | POA: Diagnosis not present

## 2016-02-13 DIAGNOSIS — M545 Low back pain: Secondary | ICD-10-CM | POA: Diagnosis not present

## 2016-02-13 DIAGNOSIS — D649 Anemia, unspecified: Secondary | ICD-10-CM | POA: Insufficient documentation

## 2016-02-13 DIAGNOSIS — Z8673 Personal history of transient ischemic attack (TIA), and cerebral infarction without residual deficits: Secondary | ICD-10-CM | POA: Diagnosis not present

## 2016-02-13 DIAGNOSIS — G8929 Other chronic pain: Secondary | ICD-10-CM | POA: Diagnosis not present

## 2016-02-13 DIAGNOSIS — I82403 Acute embolism and thrombosis of unspecified deep veins of lower extremity, bilateral: Secondary | ICD-10-CM | POA: Diagnosis not present

## 2016-02-13 DIAGNOSIS — L570 Actinic keratosis: Secondary | ICD-10-CM | POA: Diagnosis not present

## 2016-02-13 DIAGNOSIS — Z9181 History of falling: Secondary | ICD-10-CM | POA: Diagnosis not present

## 2016-02-13 DIAGNOSIS — J90 Pleural effusion, not elsewhere classified: Secondary | ICD-10-CM | POA: Insufficient documentation

## 2016-02-13 DIAGNOSIS — I10 Essential (primary) hypertension: Secondary | ICD-10-CM | POA: Diagnosis not present

## 2016-02-13 DIAGNOSIS — R7989 Other specified abnormal findings of blood chemistry: Secondary | ICD-10-CM | POA: Insufficient documentation

## 2016-02-13 DIAGNOSIS — F418 Other specified anxiety disorders: Secondary | ICD-10-CM | POA: Diagnosis not present

## 2016-02-13 DIAGNOSIS — Z7901 Long term (current) use of anticoagulants: Secondary | ICD-10-CM | POA: Diagnosis not present

## 2016-02-13 DIAGNOSIS — M353 Polymyalgia rheumatica: Secondary | ICD-10-CM | POA: Diagnosis not present

## 2016-02-13 DIAGNOSIS — M1991 Primary osteoarthritis, unspecified site: Secondary | ICD-10-CM | POA: Diagnosis not present

## 2016-02-13 DIAGNOSIS — K5790 Diverticulosis of intestine, part unspecified, without perforation or abscess without bleeding: Secondary | ICD-10-CM | POA: Diagnosis not present

## 2016-02-13 DIAGNOSIS — J45909 Unspecified asthma, uncomplicated: Secondary | ICD-10-CM | POA: Diagnosis not present

## 2016-02-13 DIAGNOSIS — I4891 Unspecified atrial fibrillation: Secondary | ICD-10-CM | POA: Diagnosis not present

## 2016-02-13 NOTE — Assessment & Plan Note (Signed)
Chadsvasc score is 6 - Xarelto initiated this admission - Cards directed care of this issue - successfully converted to NSR after TEE/DCCV  SNF- cont metoprolol, diltiazem and xarelto as prophylaxis

## 2016-02-13 NOTE — Assessment & Plan Note (Signed)
SNF - cont xanax prn

## 2016-02-13 NOTE — Assessment & Plan Note (Signed)
SNF - cont antivert prn

## 2016-02-13 NOTE — Assessment & Plan Note (Signed)
TTE w/o evidence to suggest large PE - B LE venous duplex w/o evidence of DVT - no benefit to be gained by exposing pt to CTa contrast risk as she will remain on anticoag nonetheless

## 2016-02-13 NOTE — Assessment & Plan Note (Signed)
SNF - continue home Imuran - sx well controlled

## 2016-02-13 NOTE — Assessment & Plan Note (Signed)
TSH 0.15 suggesting overcorrection - reduced dose to 52mcg - FT4 and FT3 would not add to the workup so I have canceled these labs - will need recheck of TSH in 6-8 weeks  SNF - cont synthroid 75 mcg;repeat TSH 6 weeks

## 2016-02-13 NOTE — Telephone Encounter (Signed)
Ok Thx 

## 2016-02-13 NOTE — Assessment & Plan Note (Signed)
previously on Plavix, but this was stopped by [name] on 5/23 - with addition of Xarelto will cont to hold Plavix as use of both is felt to increase bleeding risk to an excessive extent  SNF - cont xarelto

## 2016-02-13 NOTE — Assessment & Plan Note (Signed)
Mg+ and K+ normal - avoid QT prolonging medications - has improved on f/u EKGs SNF - will monitor K+ on BMP

## 2016-02-13 NOTE — Telephone Encounter (Signed)
Patient came home from Rehab yesterday.  Starting care for Home Health today.

## 2016-02-13 NOTE — Assessment & Plan Note (Signed)
no clear evidence of blood loss - newly placed on Xarelto - followed in serial fashion - Hgb climbing at time of d/c w/o evidence of bleeding  SNF - will f/u CBC;d/c Hb 11.3

## 2016-02-13 NOTE — Assessment & Plan Note (Signed)
SNF - controlled ;cont metoprolol 12.5 mg BID, cardizem 120 mg dailyand cozaar 50 mg daily

## 2016-02-13 NOTE — Assessment & Plan Note (Signed)
R layering pleural effusion with associated atx -likely simply due to RVR associated transient CHF - f/u CXR notes stability - no sx to suggest acute infection - f/u CXR in 7-10 days suggested, or earlier if develops sx to suggest infection

## 2016-02-14 ENCOUNTER — Ambulatory Visit (INDEPENDENT_AMBULATORY_CARE_PROVIDER_SITE_OTHER): Payer: Medicare Other | Admitting: Physician Assistant

## 2016-02-14 ENCOUNTER — Encounter: Payer: Self-pay | Admitting: Physician Assistant

## 2016-02-14 VITALS — BP 104/76 | HR 66 | Ht 61.5 in | Wt 108.6 lb

## 2016-02-14 DIAGNOSIS — I10 Essential (primary) hypertension: Secondary | ICD-10-CM

## 2016-02-14 DIAGNOSIS — I4891 Unspecified atrial fibrillation: Secondary | ICD-10-CM | POA: Diagnosis not present

## 2016-02-14 DIAGNOSIS — Z7901 Long term (current) use of anticoagulants: Secondary | ICD-10-CM | POA: Diagnosis not present

## 2016-02-14 LAB — CBC
HCT: 33 % — ABNORMAL LOW (ref 35.0–45.0)
Hemoglobin: 11 g/dL — ABNORMAL LOW (ref 11.7–15.5)
MCH: 28.6 pg (ref 27.0–33.0)
MCHC: 33.3 g/dL (ref 32.0–36.0)
MCV: 85.9 fL (ref 80.0–100.0)
MPV: 10.9 fL (ref 7.5–12.5)
Platelets: 266 10*3/uL (ref 140–400)
RBC: 3.84 MIL/uL (ref 3.80–5.10)
RDW: 14.2 % (ref 11.0–15.0)
WBC: 6.7 10*3/uL (ref 3.8–10.8)

## 2016-02-14 NOTE — Patient Instructions (Signed)
Medication Instructions:  Your physician recommends that you continue on your current medications as directed. Please refer to the Current Medication list given to you today.   Labwork: TODAY:  CBC  Testing/Procedures: None ordered  Follow-Up: Your physician recommends that you schedule a follow-up appointment in: 3 MONTHS WITH DR. Gwenlyn Found   Any Other Special Instructions Will Be Listed Below (If Applicable).     If you need a refill on your cardiac medications before your next appointment, please call your pharmacy.

## 2016-02-14 NOTE — Progress Notes (Signed)
CARDIOLOGY OFFICE NOTE  Date:  02/14/2016    Michele Diaz Date of Birth: 08-12-1932 Medical Record [id number]  PCP:  Walker Kehr, MD  Cardiologist:  Dr. Gwenlyn Found  Chief Complaint  Patient presents with  . Hospitalization Follow-up    seen for Dr. Gwenlyn Found (remotely seen by Dr. Stanford Breed)    History of Present Illness: Michele Diaz is a 82 y.o. female who presents today for post hospital follow-up. Patient has history of chest pain with clean cath in 2002, asthma, hypertension, hyperlipidemia who was admitted on 5/21 with dyspnea on exertion and palpitation. Per EMS, she was noted to be in new A. fib with RVR. She was placed on IV diltiazem and Xarelto. The plan was initially outpatient DC cardioversion, however patient was very symptomatic with atrial fibrillation with fatigue. Echocardiogram obtained on 02/04/2016 showed EF 55-60%, mild MR. She underwent TEE DCCV on 02/06/2016. She was eventually discharged on 02/08/2016 on rate control with BB and diltiazem. She presents today for post hospital follow-up.  Since discharge, patient has been doing very well. She is no longer staying at the nursing home, she says it is too loud for her. She is living in a town house by herself. Her husband previously passed away 6 years ago. Her grandson occasional stop by from [place] to visit her. She is very happy today as her grandson called her and says he will drive up here tonight to bring her food. She says her grandson is currently in a relationship with a nice quiet girl and hopefully she will see her as well. Otherwise she has been largely independent. She denies any recent bleeding issues. She says she has not seen Dr. Stanford Breed since 04/2011 nor does she remember what he looks like. She wished to follow-up with Dr. Gwenlyn Found who saw her during recent admission.   Given her need for chronic systemic anticoagulation, I will obtain a CBC today to make sure her hemoglobin is stable. Otherwise EKG today  indicate she is maintaining normal sinus rhythm. I have reviewed her medications, she has been tolerating current medication without problem. She denies any chest discomfort or shortness breath.    Past Medical History  Diagnosis Date  . Hypothyroid   . Asthma   . Hypertension   . GERD (gastroesophageal reflux disease)   . Depression   . Unspecified vitamin D deficiency   . Allergic rhinitis   . Diverticulitis of colon   . Hepatitis, autoimmune ([place])   . Herpes zoster 2011  . Weight loss 6/11  . Hyperlipidemia     statins increased LFTs  . PONV (postoperative nausea and vomiting)     "years ago" (01/07/2013)  . Giant cell arteritis ([place]) 2003    devashwar  . Temporal arteritis ([place])   . Daily headache     "related to temporal arteritis" (01/07/2013)  . Arthritis     "hips, hands, feet; pretty much feels like all over" (01/07/2013)  . Fibromyalgia   . Chronic lower back pain   . Anxiety   . Breast cancer ([place]) ~ 2008    "right" (01/07/2013)  . Stroke [facility])     Past Surgical History  Procedure Laterality Date  . Appendectomy    . Cholecystectomy    . Tonsillectomy    . Abdominal hysterectomy  1988  . Dilation and curettage of uterus    . Breast biopsy Right   . Breast lumpectomy Right   . Cardioversion N/A 02/06/2016  Procedure: CARDIOVERSION;  Surgeon: Skeet Latch, MD;  Location: [facility] ENDOSCOPY;  Service: Cardiovascular;  Laterality: N/A;  . Tee without cardioversion N/A 02/06/2016    Procedure: TRANSESOPHAGEAL ECHOCARDIOGRAM (TEE);  Surgeon: Skeet Latch, MD;  Location: [facility] ENDOSCOPY;  Service: Cardiovascular;  Laterality: N/A;     Medications: Current Outpatient Prescriptions  Medication Sig Dispense Refill  . ALPRAZolam (XANAX) 0.5 MG tablet Take 0.5-1 tablets (0.25-0.5 mg total) by mouth 2 (two) times daily as needed for anxiety or sleep. 180 tablet 0  . azaTHIOprine (IMURAN) 50 MG tablet Take 1 tablet (50 mg total) by mouth daily. 90 tablet 3  .  cholecalciferol (VITAMIN D) 1000 UNITS tablet Take 1 tablet (1,000 Units total) by mouth daily. 30 tablet 1  . diltiazem (CARDIZEM CD) 120 MG 24 hr capsule Take 1 capsule (120 mg total) by mouth daily.    Marland Kitchen escitalopram (LEXAPRO) 10 MG tablet Take 10 mg by mouth daily.    Marland Kitchen levothyroxine (SYNTHROID, LEVOTHROID) 75 MCG tablet Take 1 tablet (75 mcg total) by mouth daily before breakfast.    . losartan (COZAAR) 50 MG tablet Take 1 tablet (50 mg total) by mouth daily.    . meclizine (ANTIVERT) 12.5 MG tablet Take 1 tablet (12.5 mg total) by mouth 2 (two) times daily as needed for dizziness. 180 tablet 1  . metoprolol tartrate (LOPRESSOR) 25 MG tablet Take 0.5 tablets (12.5 mg total) by mouth 2 (two) times daily.    . pantoprazole (PROTONIX) 40 MG tablet Take 1 tablet (40 mg total) by mouth daily. 90 tablet 3  . Rivaroxaban (XARELTO) 15 MG TABS tablet Take 1 tablet (15 mg total) by mouth daily with supper. 42 tablet   . triamcinolone cream (KENALOG) 0.1 % Apply 1 application topically 4 (four) times daily. Use on rash 45 g 3  . [DISCONTINUED] FLUoxetine (PROZAC) 20 MG tablet Take 1 tablet (20 mg total) by mouth daily. 90 tablet 3   No current facility-administered medications for this visit.    Allergies: Allergies  Allergen Reactions  . Celecoxib Shortness Of Breath    redness  . Codeine Nausea Only  . Atorvastatin     Elevated liver enzymes  . Rosuvastatin     Elevated liver enzymes  . Penicillins Hives    Social History: The patient  reports that she has never smoked. She has never used smokeless tobacco. She reports that she does not drink alcohol or use illicit drugs.   Family History: The patient's family history includes Cancer in her brother and sister; Diabetes in her mother; Heart attack in her mother; Heart disease in her brother and sister; Hypertension in her other.   Review of Systems: Please see the history of present illness.   Otherwise, the review of systems is  positive for none.   All other systems are reviewed and negative.   Physical Exam: VS:  BP 104/76 mmHg  Pulse 66  Ht 5' 1.5" (1.562 m)  Wt 108 lb 9.6 oz (49.261 kg)  BMI 20.19 kg/m2 .  BMI Body mass index is 20.19 kg/(m^2).  Wt Readings from Last 3 Encounters:  02/14/16 108 lb 9.6 oz (49.261 kg)  02/12/16 111 lb 9.6 oz (50.621 kg)  02/08/16 108 lb 12.8 oz (49.351 kg)    General: Pleasant. Well developed, well nourished and in no acute distress.  HEENT: Normal. Neck: Supple, no JVD, carotid bruits, or masses noted.  Cardiac: Regular rate and rhythm. No murmurs, rubs, or gallops. No edema.  Respiratory:  Lungs are clear to auscultation bilaterally with normal work of breathing.  GI: Soft and nontender.  MS: No deformity or atrophy. Gait and ROM intact. Skin: Warm and dry. Color is normal.  Neuro:  Strength and sensation are intact and no gross focal deficits noted.  Psych: Alert, appropriate and with normal affect.   LABORATORY DATA:  EKG:  EKG is ordered today. This demonstrates NSR without significant ST-T wave changes.  Lab Results  Component Value Date   WBC 6.0 02/07/2016   HGB 11.3* 02/07/2016   HCT 34.4* 02/07/2016   PLT 203 02/07/2016   GLUCOSE 68 02/07/2016   CHOL 178 01/08/2013   TRIG 226* 01/08/2013   HDL 58 01/08/2013   LDLDIRECT 125.9 10/06/2007   LDLCALC 75 01/08/2013   ALT 26 02/05/2016   AST 43* 02/05/2016   NA 133* 02/07/2016   K 3.8 02/07/2016   CL 102 02/07/2016   CREATININE 0.70 02/07/2016   BUN 10 02/07/2016   CO2 24 02/07/2016   TSH 0.150* 02/03/2016   INR 1.2* 08/22/2013   HGBA1C 5.3 01/08/2013    BNP (last 3 results)  Recent Labs  02/03/16 1306  BNP 305.3*      Other Studies Reviewed Today:  TTE 02/04/2016 LV EF: 55% - 60%  ------------------------------------------------------------------- Indications: Dyspnea 786.09.  ------------------------------------------------------------------- History: PMH: Stroke.  Risk factors: Hypertension.  ------------------------------------------------------------------- Study Conclusions  - Left ventricle: The cavity size was normal. Wall thickness was  normal. Systolic function was normal. The estimated ejection  fraction was in the range of 55% to 60%. The study is not  technically sufficient to allow evaluation of LV diastolic  function. - Mitral valve: There was mild regurgitation. - Right atrium: The atrium was mildly dilated.    TEE 02/06/2016 - Left ventricle: Systolic function was normal. Wall motion was  normal; there were no regional wall motion abnormalities. - Mitral valve: There was mild to moderate regurgitation, with  multiple jets. - Left atrium: The atrium was dilated. No evidence of thrombus in  the atrial cavity or appendage. No evidence of thrombus in the  atrial cavity or appendage. - Right atrium: The atrium was dilated. No evidence of thrombus in  the atrial cavity or appendage. - Pulmonic valve: No evidence of vegetation. - Pericardium, extracardiac: There was a right pleural effusion.   LVEF 50-55% Moderate mitral regurgitation Mild tricuspid regurgitation No LA or LAA thrombus or mass  For further details see full report.  Electrical Cardioversion Procedure Note Michele Diaz [id number] 02/08/1932  Procedure: Electrical Cardioversion Indications: Atrial Fibrillation  Procedure Details Consent: Risks of procedure as well as the alternatives and risks of each were explained to the (patient/caregiver). Consent for procedure obtained. Time Out: Verified patient identification, verified procedure, site/side was marked, verified correct patient position, special equipment/implants available, medications/allergies/relevent history reviewed, required imaging and test results available. Performed  Patient placed on cardiac monitor, pulse oximetry, supplemental oxygen as necessary.  Sedation given:  propofol Pacer pads placed anterior and posterior chest.  Cardioverted 1 time(s).  Cardioverted at 150J.  Evaluation Findings: Post procedure EKG shows: NSR Complications: None Patient did tolerate procedure well.    Assessment/Plan:  1. Recently diagnosed PAF on Xarelto - currently in NSR   - CHADS2Vasc score is 4  - EKG today shows she is maintaining NSR after recent TEE DCCV  - continue current medication. Check CBC  2. HTN: well controlled on diltiazem, losartan, metoprolol    Current medicines are reviewed with the patient today.  The patient does not have concerns regarding medicines other than what has been noted above.  The following changes have been made:  See above.  Labs/ tests ordered today include:    Orders Placed This Encounter  Procedures  . CBC  . EKG 12-Lead     Disposition:   FU with Dr. Gwenlyn Found in 3 months.   Patient is agreeable to this plan and will call if any problems develop in the interim.   Signed: [name] PA-C 02/14/2016 5:06 PM  [facility] [address] [place], [place]  [address] Phone: [phone] Fax: [phone]

## 2016-02-19 DIAGNOSIS — J90 Pleural effusion, not elsewhere classified: Secondary | ICD-10-CM | POA: Diagnosis not present

## 2016-02-19 DIAGNOSIS — I82403 Acute embolism and thrombosis of unspecified deep veins of lower extremity, bilateral: Secondary | ICD-10-CM | POA: Diagnosis not present

## 2016-02-19 DIAGNOSIS — J45909 Unspecified asthma, uncomplicated: Secondary | ICD-10-CM | POA: Diagnosis not present

## 2016-02-19 DIAGNOSIS — I4891 Unspecified atrial fibrillation: Secondary | ICD-10-CM | POA: Diagnosis not present

## 2016-02-19 DIAGNOSIS — Z7901 Long term (current) use of anticoagulants: Secondary | ICD-10-CM | POA: Diagnosis not present

## 2016-02-19 DIAGNOSIS — I10 Essential (primary) hypertension: Secondary | ICD-10-CM | POA: Diagnosis not present

## 2016-02-27 ENCOUNTER — Telehealth: Payer: Self-pay

## 2016-02-27 DIAGNOSIS — I82403 Acute embolism and thrombosis of unspecified deep veins of lower extremity, bilateral: Secondary | ICD-10-CM | POA: Diagnosis not present

## 2016-02-27 DIAGNOSIS — J90 Pleural effusion, not elsewhere classified: Secondary | ICD-10-CM | POA: Diagnosis not present

## 2016-02-27 DIAGNOSIS — Z7901 Long term (current) use of anticoagulants: Secondary | ICD-10-CM | POA: Diagnosis not present

## 2016-02-27 DIAGNOSIS — I4891 Unspecified atrial fibrillation: Secondary | ICD-10-CM | POA: Diagnosis not present

## 2016-02-27 NOTE — Telephone Encounter (Signed)
Home Health Cert/Plan of Care received (02/13/2016 - 04/12/2016) and placed on MD's desk for signature

## 2016-02-29 ENCOUNTER — Ambulatory Visit (INDEPENDENT_AMBULATORY_CARE_PROVIDER_SITE_OTHER): Payer: Medicare Other | Admitting: Internal Medicine

## 2016-02-29 ENCOUNTER — Encounter: Payer: Self-pay | Admitting: Internal Medicine

## 2016-02-29 VITALS — BP 128/70 | HR 67 | Wt 110.0 lb

## 2016-02-29 DIAGNOSIS — E034 Atrophy of thyroid (acquired): Secondary | ICD-10-CM

## 2016-02-29 DIAGNOSIS — I4891 Unspecified atrial fibrillation: Secondary | ICD-10-CM

## 2016-02-29 DIAGNOSIS — E038 Other specified hypothyroidism: Secondary | ICD-10-CM | POA: Diagnosis not present

## 2016-02-29 DIAGNOSIS — I6349 Cerebral infarction due to embolism of other cerebral artery: Secondary | ICD-10-CM

## 2016-02-29 DIAGNOSIS — I10 Essential (primary) hypertension: Secondary | ICD-10-CM

## 2016-02-29 DIAGNOSIS — R040 Epistaxis: Secondary | ICD-10-CM

## 2016-02-29 NOTE — Assessment & Plan Note (Signed)
On Xarelto, Losartan, Cardizem CD, Lopressor

## 2016-02-29 NOTE — Assessment & Plan Note (Addendum)
On Losartan, Cardizem CD, Lopressor NAS diet 

## 2016-02-29 NOTE — Assessment & Plan Note (Signed)
On Levothroid Labs 

## 2016-02-29 NOTE — Assessment & Plan Note (Signed)
No relapse 

## 2016-02-29 NOTE — Progress Notes (Signed)
Subjective:  Patient ID: Michele Diaz, female    DOB: 22-May-1932  Age: 82 y.o. MRN: [id number]  CC: No chief complaint on file.   HPI Michele Diaz presents for A fib, HTN, depression f/u.  On 5/21 pt had dyspnea on exertion and palpitation. Per EMS, she was noted to be in new A. fib with RVR. She was placed on IV diltiazem and Xarelto. Echocardiogram obtained on 02/04/2016 showed EF 55-60%, mild MR. She underwent TEE DCCV on 02/06/2016.   Outpatient Prescriptions Prior to Visit  Medication Sig Dispense Refill  . azaTHIOprine (IMURAN) 50 MG tablet Take 1 tablet (50 mg total) by mouth daily. 90 tablet 3  . cholecalciferol (VITAMIN D) 1000 UNITS tablet Take 1 tablet (1,000 Units total) by mouth daily. 30 tablet 1  . diltiazem (CARDIZEM CD) 120 MG 24 hr capsule Take 1 capsule (120 mg total) by mouth daily.    Marland Kitchen escitalopram (LEXAPRO) 10 MG tablet Take 10 mg by mouth daily.    Marland Kitchen levothyroxine (SYNTHROID, LEVOTHROID) 75 MCG tablet Take 1 tablet (75 mcg total) by mouth daily before breakfast.    . losartan (COZAAR) 50 MG tablet Take 1 tablet (50 mg total) by mouth daily.    . meclizine (ANTIVERT) 12.5 MG tablet Take 1 tablet (12.5 mg total) by mouth 2 (two) times daily as needed for dizziness. 180 tablet 1  . metoprolol tartrate (LOPRESSOR) 25 MG tablet Take 0.5 tablets (12.5 mg total) by mouth 2 (two) times daily.    . pantoprazole (PROTONIX) 40 MG tablet Take 1 tablet (40 mg total) by mouth daily. 90 tablet 3  . Rivaroxaban (XARELTO) 15 MG TABS tablet Take 1 tablet (15 mg total) by mouth daily with supper. 42 tablet   . triamcinolone cream (KENALOG) 0.1 % Apply 1 application topically 4 (four) times daily. Use on rash 45 g 3  . ALPRAZolam (XANAX) 0.5 MG tablet Take 0.5-1 tablets (0.25-0.5 mg total) by mouth 2 (two) times daily as needed for anxiety or sleep. 180 tablet 0   No facility-administered medications prior to visit.    ROS Review of Systems  Constitutional: Positive for fatigue.  Negative for chills, activity change, appetite change and unexpected weight change.  HENT: Negative for congestion, mouth sores and sinus pressure.   Eyes: Negative for visual disturbance.  Respiratory: Negative for cough and chest tightness.   Gastrointestinal: Negative for nausea and abdominal pain.  Genitourinary: Negative for frequency, difficulty urinating and vaginal pain.  Musculoskeletal: Negative for back pain and gait problem.  Skin: Negative for pallor and rash.  Neurological: Negative for dizziness, tremors, weakness, light-headedness, numbness and headaches.  Psychiatric/Behavioral: Negative for confusion and sleep disturbance. The patient is nervous/anxious.     Objective:  BP 128/70 mmHg  Pulse 67  Wt 110 lb (49.896 kg)  SpO2 98%  BP Readings from Last 3 Encounters:  02/29/16 128/70  02/14/16 104/76  02/12/16 110/73    Wt Readings from Last 3 Encounters:  02/29/16 110 lb (49.896 kg)  02/14/16 108 lb 9.6 oz (49.261 kg)  02/12/16 111 lb 9.6 oz (50.621 kg)    Physical Exam  Constitutional: She appears well-developed. No distress.  HENT:  Head: Normocephalic.  Right Ear: External ear normal.  Left Ear: External ear normal.  Nose: Nose normal.  Mouth/Throat: Oropharynx is clear and moist.  Eyes: Conjunctivae are normal. Pupils are equal, round, and reactive to light. Right eye exhibits no discharge. Left eye exhibits no discharge.  Neck: Normal range  of motion. Neck supple. No JVD present. No tracheal deviation present. No thyromegaly present.  Cardiovascular: Normal rate, regular rhythm and normal heart sounds.   Pulmonary/Chest: No stridor. No respiratory distress. She has no wheezes.  Abdominal: Soft. Bowel sounds are normal. She exhibits no distension and no mass. There is no tenderness. There is no rebound and no guarding.  Musculoskeletal: She exhibits no edema or tenderness.  Lymphadenopathy:    She has no cervical adenopathy.  Neurological: She displays  normal reflexes. No cranial nerve deficit. She exhibits normal muscle tone. Coordination normal.  Skin: No rash noted. No erythema.  Psychiatric: She has a normal mood and affect. Her behavior is normal. Judgment and thought content normal.  R forearm 6 mm lesion  Lab Results  Component Value Date   WBC 6.7 02/14/2016   HGB 11.0* 02/14/2016   HCT 33.0* 02/14/2016   PLT 266 02/14/2016   GLUCOSE 68 02/07/2016   CHOL 178 01/08/2013   TRIG 226* 01/08/2013   HDL 58 01/08/2013   LDLDIRECT 125.9 10/06/2007   LDLCALC 75 01/08/2013   ALT 26 02/05/2016   AST 43* 02/05/2016   NA 133* 02/07/2016   K 3.8 02/07/2016   CL 102 02/07/2016   CREATININE 0.70 02/07/2016   BUN 10 02/07/2016   CO2 24 02/07/2016   TSH 0.150* 02/03/2016   INR 1.2* 08/22/2013   HGBA1C 5.3 01/08/2013    Dg Chest 2 View  02/03/2016  CLINICAL DATA:  Shortness of breath. EXAM: CHEST  2 VIEW COMPARISON:  January 08, 2013 FINDINGS: Cardiomegaly. Increased haziness over the right base consistent with a layering effusion. There is associated opacity which could be atelectasis. No overt edema. No other acute abnormalities. IMPRESSION: New right layering effusion with associated opacity, possibly atelectasis. Cardiomegaly with no overt edema. Electronically Signed   By: Dorise Bullion III M.D   On: 02/03/2016 14:21   Dg Chest Port 1 View  02/04/2016  CLINICAL DATA:  Dyspnea EXAM: PORTABLE CHEST 1 VIEW COMPARISON:  02/03/2016 FINDINGS: Progression of right lower lobe airspace disease and right effusion. Progression of mild left lower lobe atelectasis Pulmonary vascular congestion is present. Question mild fluid overload. IMPRESSION: Progression of bibasilar airspace disease, right greater than left. Progression of right effusion. Question congestive heart failure. Electronically Signed   By: Franchot Gallo M.D.   On: 02/04/2016 07:13    Assessment & Plan:   There are no diagnoses linked to this encounter. I am having [name]  maintain her cholecalciferol, pantoprazole, ALPRAZolam, azaTHIOprine, diltiazem, levothyroxine, losartan, meclizine, metoprolol tartrate, Rivaroxaban, triamcinolone cream, and escitalopram.  No orders of the defined types were placed in this encounter.     Follow-up: No Follow-up on file.  Walker Kehr, MD

## 2016-02-29 NOTE — Progress Notes (Signed)
Pre visit review using our clinic review tool, if applicable. No additional management support is needed unless otherwise documented below in the visit note. 

## 2016-03-01 DIAGNOSIS — Z7901 Long term (current) use of anticoagulants: Secondary | ICD-10-CM | POA: Diagnosis not present

## 2016-03-01 DIAGNOSIS — J90 Pleural effusion, not elsewhere classified: Secondary | ICD-10-CM | POA: Diagnosis not present

## 2016-03-01 DIAGNOSIS — J45909 Unspecified asthma, uncomplicated: Secondary | ICD-10-CM | POA: Diagnosis not present

## 2016-03-01 DIAGNOSIS — I10 Essential (primary) hypertension: Secondary | ICD-10-CM | POA: Diagnosis not present

## 2016-03-01 DIAGNOSIS — I4891 Unspecified atrial fibrillation: Secondary | ICD-10-CM | POA: Diagnosis not present

## 2016-03-01 DIAGNOSIS — I82403 Acute embolism and thrombosis of unspecified deep veins of lower extremity, bilateral: Secondary | ICD-10-CM | POA: Diagnosis not present

## 2016-03-06 DIAGNOSIS — I4891 Unspecified atrial fibrillation: Secondary | ICD-10-CM | POA: Diagnosis not present

## 2016-03-06 DIAGNOSIS — I10 Essential (primary) hypertension: Secondary | ICD-10-CM | POA: Diagnosis not present

## 2016-03-06 DIAGNOSIS — J45909 Unspecified asthma, uncomplicated: Secondary | ICD-10-CM | POA: Diagnosis not present

## 2016-03-06 DIAGNOSIS — Z7901 Long term (current) use of anticoagulants: Secondary | ICD-10-CM | POA: Diagnosis not present

## 2016-03-06 DIAGNOSIS — J90 Pleural effusion, not elsewhere classified: Secondary | ICD-10-CM | POA: Diagnosis not present

## 2016-03-06 DIAGNOSIS — I82403 Acute embolism and thrombosis of unspecified deep veins of lower extremity, bilateral: Secondary | ICD-10-CM | POA: Diagnosis not present

## 2016-03-11 ENCOUNTER — Other Ambulatory Visit: Payer: Self-pay | Admitting: Internal Medicine

## 2016-03-14 ENCOUNTER — Telehealth: Payer: Self-pay

## 2016-03-14 DIAGNOSIS — J90 Pleural effusion, not elsewhere classified: Secondary | ICD-10-CM | POA: Diagnosis not present

## 2016-03-14 DIAGNOSIS — I4891 Unspecified atrial fibrillation: Secondary | ICD-10-CM | POA: Diagnosis not present

## 2016-03-14 DIAGNOSIS — Z7901 Long term (current) use of anticoagulants: Secondary | ICD-10-CM | POA: Diagnosis not present

## 2016-03-14 DIAGNOSIS — J45909 Unspecified asthma, uncomplicated: Secondary | ICD-10-CM | POA: Diagnosis not present

## 2016-03-14 DIAGNOSIS — I10 Essential (primary) hypertension: Secondary | ICD-10-CM | POA: Diagnosis not present

## 2016-03-14 DIAGNOSIS — I82403 Acute embolism and thrombosis of unspecified deep veins of lower extremity, bilateral: Secondary | ICD-10-CM | POA: Diagnosis not present

## 2016-03-14 NOTE — Telephone Encounter (Signed)
FYI: She has met her goals with home health. That have d/c her from it.  Michele Diaz [phone]

## 2016-03-15 NOTE — Telephone Encounter (Signed)
Ok Thx 

## 2016-03-17 ENCOUNTER — Other Ambulatory Visit: Payer: Self-pay | Admitting: *Deleted

## 2016-03-17 MED ORDER — LEVOTHYROXINE SODIUM 75 MCG PO TABS: 75.0000 ug | ORAL_TABLET | Freq: Every day | ORAL | Status: DC

## 2016-03-28 ENCOUNTER — Ambulatory Visit: Payer: Medicare Other | Admitting: Internal Medicine

## 2016-04-07 ENCOUNTER — Other Ambulatory Visit: Payer: Self-pay | Admitting: *Deleted

## 2016-04-07 MED ORDER — PANTOPRAZOLE SODIUM 40 MG PO TBEC: 40.0000 mg | DELAYED_RELEASE_TABLET | Freq: Every day | ORAL | 2 refills | Status: DC

## 2016-04-09 ENCOUNTER — Other Ambulatory Visit: Payer: Self-pay | Admitting: Nurse Practitioner

## 2016-05-20 ENCOUNTER — Ambulatory Visit (INDEPENDENT_AMBULATORY_CARE_PROVIDER_SITE_OTHER): Payer: Medicare Other | Admitting: Cardiovascular Disease

## 2016-05-20 ENCOUNTER — Encounter: Payer: Self-pay | Admitting: Cardiovascular Disease

## 2016-05-20 DIAGNOSIS — I10 Essential (primary) hypertension: Secondary | ICD-10-CM | POA: Diagnosis not present

## 2016-05-20 DIAGNOSIS — I4891 Unspecified atrial fibrillation: Secondary | ICD-10-CM

## 2016-05-20 NOTE — Assessment & Plan Note (Signed)
History of  His initial fibrillation status post TEE guided DC cardioversion by Dr. Oval Linsey 02/06/16. She had normal LV systolic function with mild to moderate mitral regurgitation. Her CHA2DSVASC2 and she is on Xarelto t oral anticoagulation with stable hemoglobin.  Marland Kitchen

## 2016-05-20 NOTE — Progress Notes (Signed)
05/20/2016 Michele Diaz   [birth year]  [id number]  Primary Physician Michele Kehr, MD Primary Cardiologist: Michele Harp MD Michele Diaz  HPI:  Michele Diaz is a very pleasant 82 year old thin-appearing widowed Caucasian female mother of 2 mg of 5 grandchildren who sees Dr. Alain Diaz , for primary care. She was hospitalized 02/03/16 for 5 days for A. fib with RVR feeling of fatigue and weakness. She also was cardioverted with T guidance by Dr. Oval Diaz on 5/24 and has maintained sinus rhythm since on Xarelto. She denies chest pain or shortness of breath.   Current Outpatient Prescriptions  Medication Sig Dispense Refill  . azaTHIOprine (IMURAN) 50 MG tablet Take 1 tablet (50 mg total) by mouth daily. 90 tablet 3  . diltiazem (CARDIZEM CD) 120 MG 24 hr capsule TAKE 1 CAPSULE BY MOUTH ONCE DAILY 30 capsule 11  . escitalopram (LEXAPRO) 10 MG tablet Take 10 mg by mouth daily.    Marland Kitchen levothyroxine (SYNTHROID, LEVOTHROID) 75 MCG tablet Take 1 tablet (75 mcg total) by mouth daily before breakfast. 90 tablet 1  . losartan (COZAAR) 50 MG tablet TAKE 1 TABLET BY MOUTH ONCE DAILY 30 tablet 11  . meclizine (ANTIVERT) 12.5 MG tablet Take 1 tablet (12.5 mg total) by mouth 2 (two) times daily as needed for dizziness. 180 tablet 1  . metoprolol tartrate (LOPRESSOR) 25 MG tablet TAKE 1/2 TABLET BY MOUTH 2 TIMES DAILY 30 tablet 11  . pantoprazole (PROTONIX) 40 MG tablet Take 1 tablet (40 mg total) by mouth daily. 90 tablet 2  . XARELTO 15 MG TABS tablet TAKE 1 TABLET BY MOUTH ONCE DAILY 30 tablet 11  . ALPRAZolam (XANAX) 0.5 MG tablet Take 0.5-1 tablets (0.25-0.5 mg total) by mouth 2 (two) times daily as needed for anxiety or sleep. 180 tablet 0  . cholecalciferol (VITAMIN D) 1000 UNITS tablet Take 1 tablet (1,000 Units total) by mouth daily. (Patient not taking: Reported on 05/20/2016) 30 tablet 1  . triamcinolone cream (KENALOG) 0.1 % Apply 1 application topically 4 (four) times daily. Use on  rash (Patient not taking: Reported on 05/20/2016) 45 g 3   No current facility-administered medications for this visit.     Allergies  Allergen Reactions  . Celecoxib Shortness Of Breath    redness  . Codeine Nausea Only  . Atorvastatin     Elevated liver enzymes  . Rosuvastatin     Elevated liver enzymes  . Penicillins Hives    Social History   Social History  . Marital status: Widowed    Spouse name: N/A  . Number of children: 2  . Years of education: N/A   Occupational History  . retired Retired   Social History Main Topics  . Smoking status: Never Smoker  . Smokeless tobacco: Never Used  . Alcohol use No  . Drug use: No  . Sexual activity: No   Other Topics Concern  . Not on file   Social History Narrative  . No narrative on file     Review of Systems: General: negative for chills, fever, night sweats or weight changes.  Cardiovascular: negative for chest pain, dyspnea on exertion, edema, orthopnea, palpitations, paroxysmal nocturnal dyspnea or shortness of breath Dermatological: negative for rash Respiratory: negative for cough or wheezing Urologic: negative for hematuria Abdominal: negative for nausea, vomiting, diarrhea, bright red blood per rectum, melena, or hematemesis Neurologic: negative for visual changes, syncope, or dizziness All other systems reviewed and are otherwise negative except as  noted above.    Blood pressure 100/62.  General appearance: alert and no distress Neck: no adenopathy, no carotid bruit, no JVD, supple, symmetrical, trachea midline and thyroid not enlarged, symmetric, no tenderness/mass/nodules Lungs: clear to auscultation bilaterally Heart: regular rate and rhythm, S1, S2 normal, no murmur, click, rub or gallop Extremities: extremities normal, atraumatic, no cyanosis or edema  EKG not performed today  ASSESSMENT AND PLAN:   Essential hypertension History of hypertension blood pressure measures 120/70. She is on  Cardizem, metoprolol and losartan. Continue current meds at current dosing  Atrial fibrillation with rapid ventricular response ([place]) History of  His initial fibrillation status post TEE guided DC cardioversion by Dr. Oval Diaz 02/06/16. She had normal LV systolic function with mild to moderate mitral regurgitation. Her CHA2DSVASC2 and she is on Xarelto t oral anticoagulation with stable hemoglobin.  Michele Harp MD FACP,FACC,FAHA, [facility] 05/20/2016 1:43 PM

## 2016-05-20 NOTE — Assessment & Plan Note (Signed)
History of hypertension blood pressure measures 120/70. She is on Cardizem, metoprolol and losartan. Continue current meds at current dosing

## 2016-05-20 NOTE — Patient Instructions (Signed)

## 2016-05-27 ENCOUNTER — Ambulatory Visit (INDEPENDENT_AMBULATORY_CARE_PROVIDER_SITE_OTHER): Payer: Medicare Other | Admitting: Internal Medicine

## 2016-05-27 ENCOUNTER — Encounter: Payer: Self-pay | Admitting: Internal Medicine

## 2016-05-27 ENCOUNTER — Ambulatory Visit: Payer: Medicare Other | Admitting: Internal Medicine

## 2016-05-27 ENCOUNTER — Other Ambulatory Visit (INDEPENDENT_AMBULATORY_CARE_PROVIDER_SITE_OTHER): Payer: Medicare Other

## 2016-05-27 DIAGNOSIS — I4891 Unspecified atrial fibrillation: Secondary | ICD-10-CM

## 2016-05-27 DIAGNOSIS — F341 Dysthymic disorder: Secondary | ICD-10-CM

## 2016-05-27 DIAGNOSIS — E034 Atrophy of thyroid (acquired): Secondary | ICD-10-CM | POA: Diagnosis not present

## 2016-05-27 DIAGNOSIS — R413 Other amnesia: Secondary | ICD-10-CM

## 2016-05-27 DIAGNOSIS — K219 Gastro-esophageal reflux disease without esophagitis: Secondary | ICD-10-CM | POA: Diagnosis not present

## 2016-05-27 DIAGNOSIS — D649 Anemia, unspecified: Secondary | ICD-10-CM

## 2016-05-27 DIAGNOSIS — I6349 Cerebral infarction due to embolism of other cerebral artery: Secondary | ICD-10-CM | POA: Diagnosis not present

## 2016-05-27 DIAGNOSIS — E038 Other specified hypothyroidism: Secondary | ICD-10-CM

## 2016-05-27 DIAGNOSIS — M353 Polymyalgia rheumatica: Secondary | ICD-10-CM

## 2016-05-27 LAB — CBC WITH DIFFERENTIAL/PLATELET
BASOS ABS: 0 10*3/uL (ref 0.0–0.1)
Basophils Relative: 0.5 % (ref 0.0–3.0)
EOS ABS: 0.2 10*3/uL (ref 0.0–0.7)
Eosinophils Relative: 3 % (ref 0.0–5.0)
HEMATOCRIT: 28.5 % — AB (ref 36.0–46.0)
Hemoglobin: 9.8 g/dL — ABNORMAL LOW (ref 12.0–15.0)
LYMPHS ABS: 0.7 10*3/uL (ref 0.7–4.0)
LYMPHS PCT: 10.8 % — AB (ref 12.0–46.0)
MCHC: 34.2 g/dL (ref 30.0–36.0)
MCV: 85 fl (ref 78.0–100.0)
MONOS PCT: 12.1 % — AB (ref 3.0–12.0)
Monocytes Absolute: 0.8 10*3/uL (ref 0.1–1.0)
NEUTROS ABS: 4.9 10*3/uL (ref 1.4–7.7)
NEUTROS PCT: 73.6 % (ref 43.0–77.0)
PLATELETS: 206 10*3/uL (ref 150.0–400.0)
RBC: 3.36 Mil/uL — AB (ref 3.87–5.11)
RDW: 13.8 % (ref 11.5–15.5)
WBC: 6.6 10*3/uL (ref 4.0–10.5)

## 2016-05-27 LAB — BASIC METABOLIC PANEL
BUN: 12 mg/dL (ref 6–23)
CALCIUM: 8.8 mg/dL (ref 8.4–10.5)
CO2: 29 meq/L (ref 19–32)
CREATININE: 0.95 mg/dL (ref 0.40–1.20)
Chloride: 100 mEq/L (ref 96–112)
GFR: 59.9 mL/min — AB (ref >60.00)
Glucose, Bld: 112 mg/dL — ABNORMAL HIGH (ref 70–99)
Potassium: 4.6 mEq/L (ref 3.5–5.1)
SODIUM: 132 meq/L — AB (ref 135–145)

## 2016-05-27 LAB — TSH: TSH: 1.01 u[IU]/mL (ref 0.35–4.50)

## 2016-05-27 NOTE — Progress Notes (Signed)
Subjective:  Patient ID: Michele Diaz, female    DOB: Jan 30, 1932  Age: 82 y.o. MRN: [id number]  CC: Follow-up   HPI Michele Diaz presents for HTN, depression, anxiety, A fib f/u  Outpatient Medications Prior to Visit  Medication Sig Dispense Refill  . azaTHIOprine (IMURAN) 50 MG tablet Take 1 tablet (50 mg total) by mouth daily. 90 tablet 3  . cholecalciferol (VITAMIN D) 1000 UNITS tablet Take 1 tablet (1,000 Units total) by mouth daily. 30 tablet 1  . diltiazem (CARDIZEM CD) 120 MG 24 hr capsule TAKE 1 CAPSULE BY MOUTH ONCE DAILY 30 capsule 11  . levothyroxine (SYNTHROID, LEVOTHROID) 75 MCG tablet Take 1 tablet (75 mcg total) by mouth daily before breakfast. 90 tablet 1  . losartan (COZAAR) 50 MG tablet TAKE 1 TABLET BY MOUTH ONCE DAILY 30 tablet 11  . meclizine (ANTIVERT) 12.5 MG tablet Take 1 tablet (12.5 mg total) by mouth 2 (two) times daily as needed for dizziness. 180 tablet 1  . metoprolol tartrate (LOPRESSOR) 25 MG tablet TAKE 1/2 TABLET BY MOUTH 2 TIMES DAILY 30 tablet 11  . pantoprazole (PROTONIX) 40 MG tablet Take 1 tablet (40 mg total) by mouth daily. 90 tablet 2  . triamcinolone cream (KENALOG) 0.1 % Apply 1 application topically 4 (four) times daily. Use on rash 45 g 3  . XARELTO 15 MG TABS tablet TAKE 1 TABLET BY MOUTH ONCE DAILY 30 tablet 11  . ALPRAZolam (XANAX) 0.5 MG tablet Take 0.5-1 tablets (0.25-0.5 mg total) by mouth 2 (two) times daily as needed for anxiety or sleep. 180 tablet 0  . escitalopram (LEXAPRO) 10 MG tablet Take 10 mg by mouth daily.     No facility-administered medications prior to visit.     ROS Review of Systems  Constitutional: Positive for fatigue. Negative for activity change, appetite change, chills and unexpected weight change.  HENT: Negative for congestion, mouth sores and sinus pressure.   Eyes: Negative for visual disturbance.  Respiratory: Negative for cough and chest tightness.   Gastrointestinal: Negative for abdominal pain and  nausea.  Genitourinary: Negative for difficulty urinating, frequency and vaginal pain.  Musculoskeletal: Negative for back pain and gait problem.  Skin: Negative for pallor and rash.  Neurological: Negative for dizziness, tremors, weakness, numbness and headaches.  Psychiatric/Behavioral: Negative for confusion, sleep disturbance and suicidal ideas. The patient is nervous/anxious.     Objective:  BP 132/80   Pulse 78   Wt 113 lb (51.3 kg)   SpO2 95%   BMI 21.01 kg/m   BP Readings from Last 3 Encounters:  05/27/16 132/80  05/20/16 100/62  02/29/16 128/70    Wt Readings from Last 3 Encounters:  05/27/16 113 lb (51.3 kg)  02/29/16 110 lb (49.9 kg)  02/14/16 108 lb 9.6 oz (49.3 kg)    Physical Exam  Constitutional: She appears well-developed. No distress.  HENT:  Head: Normocephalic.  Right Ear: External ear normal.  Left Ear: External ear normal.  Nose: Nose normal.  Mouth/Throat: Oropharynx is clear and moist.  Eyes: Conjunctivae are normal. Pupils are equal, round, and reactive to light. Right eye exhibits no discharge. Left eye exhibits no discharge.  Neck: Normal range of motion. Neck supple. No JVD present. No tracheal deviation present. No thyromegaly present.  Cardiovascular: Normal rate, regular rhythm and normal heart sounds.   Pulmonary/Chest: No stridor. No respiratory distress. She has no wheezes.  Abdominal: Soft. Bowel sounds are normal. She exhibits no distension and no mass. There  is no tenderness. There is no rebound and no guarding.  Musculoskeletal: She exhibits no edema or tenderness.  Lymphadenopathy:    She has no cervical adenopathy.  Neurological: She displays normal reflexes. No cranial nerve deficit. She exhibits normal muscle tone. Coordination normal.  Skin: No rash noted. No erythema.  Psychiatric: She has a normal mood and affect. Her behavior is normal. Judgment and thought content normal.    Lab Results  Component Value Date   WBC 6.7  02/14/2016   HGB 11.0 (L) 02/14/2016   HCT 33.0 (L) 02/14/2016   PLT 266 02/14/2016   GLUCOSE 68 02/07/2016   CHOL 178 01/08/2013   TRIG 226 (H) 01/08/2013   HDL 58 01/08/2013   LDLDIRECT 125.9 10/06/2007   LDLCALC 75 01/08/2013   ALT 26 02/05/2016   AST 43 (H) 02/05/2016   NA 133 (L) 02/07/2016   K 3.8 02/07/2016   CL 102 02/07/2016   CREATININE 0.70 02/07/2016   BUN 10 02/07/2016   CO2 24 02/07/2016   TSH 0.150 (L) 02/03/2016   INR 1.2 (H) 08/22/2013   HGBA1C 5.3 01/08/2013    Dg Chest 2 View  Result Date: 02/03/2016 CLINICAL DATA:  Shortness of breath. EXAM: CHEST  2 VIEW COMPARISON:  January 08, 2013 FINDINGS: Cardiomegaly. Increased haziness over the right base consistent with a layering effusion. There is associated opacity which could be atelectasis. No overt edema. No other acute abnormalities. IMPRESSION: New right layering effusion with associated opacity, possibly atelectasis. Cardiomegaly with no overt edema. Electronically Signed   By: Dorise Bullion III M.D   On: 02/03/2016 14:21   Dg Chest Port 1 View  Result Date: 02/04/2016 CLINICAL DATA:  Dyspnea EXAM: PORTABLE CHEST 1 VIEW COMPARISON:  02/03/2016 FINDINGS: Progression of right lower lobe airspace disease and right effusion. Progression of mild left lower lobe atelectasis Pulmonary vascular congestion is present. Question mild fluid overload. IMPRESSION: Progression of bibasilar airspace disease, right greater than left. Progression of right effusion. Question congestive heart failure. Electronically Signed   By: Franchot Gallo M.D.   On: 02/04/2016 07:13    Assessment & Plan:   There are no diagnoses linked to this encounter. I am having [name] maintain her cholecalciferol, ALPRAZolam, azaTHIOprine, meclizine, triamcinolone cream, escitalopram, XARELTO, metoprolol tartrate, losartan, diltiazem, levothyroxine, and pantoprazole.  No orders of the defined types were placed in this encounter.    Follow-up: No  Follow-up on file.  Walker Kehr, MD

## 2016-05-27 NOTE — Assessment & Plan Note (Signed)
On Levothroid 

## 2016-05-27 NOTE — Assessment & Plan Note (Signed)
On Xarelto, Losartan, Cardizem CD, Lopressor

## 2016-05-27 NOTE — Assessment & Plan Note (Signed)
On Aricept 

## 2016-05-27 NOTE — Patient Instructions (Signed)
Re-start Lexapro  - take at bedtime

## 2016-05-27 NOTE — Assessment & Plan Note (Addendum)
Re-start Lexapro (she stopped it for a while - ?reason) - take at HS Xanax prn  Potential benefits of a long term benzodiazepines  use as well as potential risks  and complications were explained to the patient and were aknowledged. Labs today

## 2016-06-05 NOTE — Addendum Note (Signed)
Addended by: Cresenciano Lick on: 06/05/2016 01:06 PM   Modules accepted: Orders

## 2016-06-30 ENCOUNTER — Other Ambulatory Visit (INDEPENDENT_AMBULATORY_CARE_PROVIDER_SITE_OTHER): Payer: Medicare Other

## 2016-06-30 ENCOUNTER — Ambulatory Visit (INDEPENDENT_AMBULATORY_CARE_PROVIDER_SITE_OTHER): Payer: Medicare Other | Admitting: *Deleted

## 2016-06-30 DIAGNOSIS — D649 Anemia, unspecified: Secondary | ICD-10-CM

## 2016-06-30 DIAGNOSIS — Z23 Encounter for immunization: Secondary | ICD-10-CM

## 2016-06-30 LAB — CBC WITH DIFFERENTIAL/PLATELET
BASOS ABS: 0 10*3/uL (ref 0.0–0.1)
Basophils Relative: 0.2 % (ref 0.0–3.0)
Eosinophils Absolute: 0.1 10*3/uL (ref 0.0–0.7)
Eosinophils Relative: 1.3 % (ref 0.0–5.0)
HEMATOCRIT: 28.6 % — AB (ref 36.0–46.0)
Hemoglobin: 10 g/dL — ABNORMAL LOW (ref 12.0–15.0)
Lymphs Abs: 0.6 10*3/uL — ABNORMAL LOW (ref 0.7–4.0)
MCHC: 35 g/dL (ref 30.0–36.0)
MCV: 85.4 fl (ref 78.0–100.0)
MONOS PCT: 10.5 % (ref 3.0–12.0)
Monocytes Absolute: 0.9 10*3/uL (ref 0.1–1.0)
NEUTROS PCT: 81.1 % — AB (ref 43.0–77.0)
Neutro Abs: 6.9 10*3/uL (ref 1.4–7.7)
Platelets: 188 10*3/uL (ref 150.0–400.0)
RBC: 3.36 Mil/uL — AB (ref 3.87–5.11)
RDW: 13.8 % (ref 11.5–15.5)
WBC: 8.4 10*3/uL (ref 4.0–10.5)

## 2016-06-30 LAB — IRON AND TIBC
%SAT: 8 % — AB (ref 11–50)
Iron: 37 ug/dL — ABNORMAL LOW (ref 45–160)
TIBC: 480 ug/dL — AB (ref 250–450)
UIBC: 443 ug/dL — ABNORMAL HIGH (ref 125–400)

## 2016-07-02 ENCOUNTER — Telehealth: Payer: Self-pay | Admitting: *Deleted

## 2016-07-02 DIAGNOSIS — D509 Iron deficiency anemia, unspecified: Secondary | ICD-10-CM

## 2016-07-02 NOTE — Telephone Encounter (Signed)
-----   Message from Cassandria Anger, MD sent at 07/01/2016  8:12 PM EDT ----- Erline Levine,  Please inform patient that she has iron deficiency with anemia. Pls have the pt do guaiac stool tests x3 at home Thx

## 2016-07-02 NOTE — Telephone Encounter (Signed)
I called pt. Stool/FOBT cards are upfront for pt to p/u. She is aware to return them to [facility].     Notes Recorded by Cresenciano Lick, CMA on 07/02/2016 at 12:39 PM EDT Pt informed ------  Notes Recorded by Cassandria Anger, MD on 07/01/2016 at 8:12 PM EDT Michele Diaz,  Please inform patient that she has iron deficiency with anemia. Pls have the pt do guaiac stool tests x3 at home Thx

## 2016-07-03 ENCOUNTER — Ambulatory Visit: Payer: Medicare Other

## 2016-07-08 ENCOUNTER — Other Ambulatory Visit: Payer: Medicare Other

## 2016-07-09 ENCOUNTER — Other Ambulatory Visit (INDEPENDENT_AMBULATORY_CARE_PROVIDER_SITE_OTHER): Payer: Medicare Other

## 2016-07-09 DIAGNOSIS — D509 Iron deficiency anemia, unspecified: Secondary | ICD-10-CM | POA: Diagnosis not present

## 2016-07-09 LAB — HEMOCCULT SLIDES (X 3 CARDS)
Fecal Occult Blood: NEGATIVE
OCCULT 1: NEGATIVE
OCCULT 2: NEGATIVE
OCCULT 3: NEGATIVE
OCCULT 4: NEGATIVE
OCCULT 5: NEGATIVE

## 2016-08-01 ENCOUNTER — Encounter: Payer: Self-pay | Admitting: Nurse Practitioner

## 2016-08-01 ENCOUNTER — Ambulatory Visit (INDEPENDENT_AMBULATORY_CARE_PROVIDER_SITE_OTHER)
Admission: RE | Admit: 2016-08-01 | Discharge: 2016-08-01 | Disposition: A | Discharge status: Home / Self Care | Payer: Medicare Other | Source: Ambulatory Visit | Attending: Nurse Practitioner | Admitting: Nurse Practitioner

## 2016-08-01 ENCOUNTER — Ambulatory Visit (INDEPENDENT_AMBULATORY_CARE_PROVIDER_SITE_OTHER): Payer: Medicare Other | Admitting: Nurse Practitioner

## 2016-08-01 ENCOUNTER — Other Ambulatory Visit (INDEPENDENT_AMBULATORY_CARE_PROVIDER_SITE_OTHER): Payer: Medicare Other

## 2016-08-01 VITALS — BP 118/78 | HR 113 | Temp 97.6°F | Ht 62.0 in | Wt 112.0 lb

## 2016-08-01 DIAGNOSIS — J90 Pleural effusion, not elsewhere classified: Secondary | ICD-10-CM | POA: Diagnosis not present

## 2016-08-01 DIAGNOSIS — R0609 Other forms of dyspnea: Secondary | ICD-10-CM

## 2016-08-01 DIAGNOSIS — R0602 Shortness of breath: Secondary | ICD-10-CM | POA: Diagnosis not present

## 2016-08-01 DIAGNOSIS — I4891 Unspecified atrial fibrillation: Secondary | ICD-10-CM

## 2016-08-01 DIAGNOSIS — E034 Atrophy of thyroid (acquired): Secondary | ICD-10-CM

## 2016-08-01 DIAGNOSIS — I6349 Cerebral infarction due to embolism of other cerebral artery: Secondary | ICD-10-CM

## 2016-08-01 LAB — TSH: TSH: 5.45 u[IU]/mL — AB (ref 0.35–4.50)

## 2016-08-01 LAB — T4, FREE: FREE T4: 1.18 ng/dL (ref 0.60–1.60)

## 2016-08-01 MED ORDER — FUROSEMIDE 20 MG PO TABS: 20.0000 mg | ORAL_TABLET | Freq: Every day | ORAL | 0 refills | Status: DC

## 2016-08-01 MED ORDER — LEVOTHYROXINE SODIUM 100 MCG PO TABS: 100.0000 ug | ORAL_TABLET | Freq: Every day | ORAL | 6 refills | Status: DC

## 2016-08-01 MED ORDER — METOPROLOL TARTRATE 25 MG PO TABS: 25.0000 mg | ORAL_TABLET | Freq: Two times a day (BID) | ORAL | 11 refills | Status: DC

## 2016-08-01 MED ORDER — POTASSIUM CHLORIDE ER 10 MEQ PO TBCR: 10.0000 meq | EXTENDED_RELEASE_TABLET | Freq: Every day | ORAL | 0 refills | Status: DC

## 2016-08-01 MED ORDER — DILTIAZEM HCL ER COATED BEADS 120 MG PO CP24: 240.0000 mg | ORAL_CAPSULE | Freq: Every day | ORAL | 11 refills | Status: DC

## 2016-08-01 NOTE — Assessment & Plan Note (Signed)
Cardioversion done May 2017: A. fib converted to normal sinus rhythm. Today patient is in A. fib with rate of 113 but symptomatic. Increased Cardizem to 240 mg, Lopressor to 25 mg twice a day. Chest x-ray indicates persistent bilateral pleural effusion: Started on Lasix 20 mg once a day and potassium 10 mEq once a day. She is scheduled to see cardiologist on Monday, 08/04/2016.

## 2016-08-01 NOTE — Patient Instructions (Addendum)
Increase metoprolol to 1tab twice a day. Increase cardizem or diltiazem to 2tabs once a day. Start lasix and potassium.  Go to basement for CXR and labs. You will be called with results.  Follow up with cardiologist as scheduled. Go to hospital if symptoms worsen over the weekend.

## 2016-08-01 NOTE — Progress Notes (Signed)
Subjective:  Patient ID: Michele Diaz, female    DOB: Apr 02, 1932  Age: 82 y.o. MRN: [id number]  CC: Shortness of Breath (Pt stated having hard time  breathing for 4 weeks.)   Shortness of Breath  This is a recurrent problem. The current episode started 1 to 4 weeks ago. The problem occurs constantly. The problem has been gradually worsening. Associated symptoms include orthopnea and PND. Pertinent negatives include no abdominal pain, chest pain, fever, hemoptysis, leg swelling, rhinorrhea, sore throat, sputum production, swollen glands, syncope, vomiting or wheezing. The symptoms are aggravated by any activity and lying flat. The patient has no known risk factors for DVT/PE. She has tried nothing for the symptoms.    Outpatient Medications Prior to Visit  Medication Sig Dispense Refill  . ALPRAZolam (XANAX) 0.5 MG tablet Take 0.5-1 tablets (0.25-0.5 mg total) by mouth 2 (two) times daily as needed for anxiety or sleep. 180 tablet 0  . azaTHIOprine (IMURAN) 50 MG tablet Take 1 tablet (50 mg total) by mouth daily. 90 tablet 3  . cholecalciferol (VITAMIN D) 1000 UNITS tablet Take 1 tablet (1,000 Units total) by mouth daily. 30 tablet 1  . escitalopram (LEXAPRO) 10 MG tablet Take 10 mg by mouth daily.    Marland Kitchen losartan (COZAAR) 50 MG tablet TAKE 1 TABLET BY MOUTH ONCE DAILY 30 tablet 11  . meclizine (ANTIVERT) 12.5 MG tablet Take 1 tablet (12.5 mg total) by mouth 2 (two) times daily as needed for dizziness. 180 tablet 1  . pantoprazole (PROTONIX) 40 MG tablet Take 1 tablet (40 mg total) by mouth daily. 90 tablet 2  . triamcinolone cream (KENALOG) 0.1 % Apply 1 application topically 4 (four) times daily. Use on rash 45 g 3  . XARELTO 15 MG TABS tablet TAKE 1 TABLET BY MOUTH ONCE DAILY 30 tablet 11  . diltiazem (CARDIZEM CD) 120 MG 24 hr capsule TAKE 1 CAPSULE BY MOUTH ONCE DAILY 30 capsule 11  . levothyroxine (SYNTHROID, LEVOTHROID) 75 MCG tablet Take 1 tablet (75 mcg total) by mouth daily before  breakfast. 90 tablet 1  . metoprolol tartrate (LOPRESSOR) 25 MG tablet TAKE 1/2 TABLET BY MOUTH 2 TIMES DAILY 30 tablet 11   No facility-administered medications prior to visit.     ROS See HPI  Objective:  BP 118/78 (BP Location: Left Arm, Patient Position: Sitting, Cuff Size: Normal)   Pulse (!) 113   Temp 97.6 F (36.4 C)   Ht 5\' 2"  (1.575 m)   Wt 112 lb (50.8 kg)   SpO2 96%   PF 118 L/min   BMI 20.49 kg/m   BP Readings from Last 3 Encounters:  08/01/16 118/78  05/27/16 132/80  05/20/16 100/62    Wt Readings from Last 3 Encounters:  08/01/16 112 lb (50.8 kg)  05/27/16 113 lb (51.3 kg)  02/29/16 110 lb (49.9 kg)    Physical Exam  Constitutional: She is oriented to person, place, and time. No distress.  Neck: No JVD present. No thyromegaly present.  Cardiovascular: Normal rate, S1 normal and S2 normal.  An irregularly irregular rhythm present. Exam reveals no gallop and no friction rub.   No murmur heard. Pulmonary/Chest: Effort normal and breath sounds normal.  Musculoskeletal: She exhibits no edema.  Neurological: She is alert and oriented to person, place, and time.  Vitals reviewed.   Lab Results  Component Value Date   WBC 8.4 06/30/2016   HGB 10.0 (L) 06/30/2016   HCT 28.6 (L) 06/30/2016   PLT  188.0 06/30/2016   GLUCOSE 112 (H) 05/27/2016   CHOL 178 01/08/2013   TRIG 226 (H) 01/08/2013   HDL 58 01/08/2013   LDLDIRECT 125.9 10/06/2007   LDLCALC 75 01/08/2013   ALT 26 02/05/2016   AST 43 (H) 02/05/2016   NA 132 (L) 05/27/2016   K 4.6 05/27/2016   CL 100 05/27/2016   CREATININE 0.95 05/27/2016   BUN 12 05/27/2016   CO2 29 05/27/2016   TSH 5.45 (H) 08/01/2016   INR 1.2 (H) 08/22/2013   HGBA1C 5.3 01/08/2013    Dg Chest 2 View  Result Date: 02/03/2016 CLINICAL DATA:  Shortness of breath. EXAM: CHEST  2 VIEW COMPARISON:  January 08, 2013 FINDINGS: Cardiomegaly. Increased haziness over the right base consistent with a layering effusion. There is  associated opacity which could be atelectasis. No overt edema. No other acute abnormalities. IMPRESSION: New right layering effusion with associated opacity, possibly atelectasis. Cardiomegaly with no overt edema. Electronically Signed   By: Dorise Bullion III M.D   On: 02/03/2016 14:21   Dg Chest Port 1 View  Result Date: 02/04/2016 CLINICAL DATA:  Dyspnea EXAM: PORTABLE CHEST 1 VIEW COMPARISON:  02/03/2016 FINDINGS: Progression of right lower lobe airspace disease and right effusion. Progression of mild left lower lobe atelectasis Pulmonary vascular congestion is present. Question mild fluid overload. IMPRESSION: Progression of bibasilar airspace disease, right greater than left. Progression of right effusion. Question congestive heart failure. Electronically Signed   By: Franchot Gallo M.D.   On: 02/04/2016 07:13    Assessment & Plan:   Michele Diaz was seen today for shortness of breath.  Diagnoses and all orders for this visit:  Dyspnea on exertion -     DG Chest 2 View; Future -     furosemide (LASIX) 20 MG tablet; Take 1 tablet (20 mg total) by mouth daily. -     potassium chloride (K-DUR) 10 MEQ tablet; Take 1 tablet (10 mEq total) by mouth daily.  Hypothyroidism due to acquired atrophy of thyroid -     TSH; Future -     T4, free; Future -     levothyroxine (SYNTHROID, LEVOTHROID) 100 MCG tablet; Take 1 tablet (100 mcg total) by mouth daily before breakfast.  Atrial fibrillation with rapid ventricular response ([place]) -     DG Chest 2 View; Future -     metoprolol tartrate (LOPRESSOR) 25 MG tablet; Take 1 tablet (25 mg total) by mouth 2 (two) times daily. -     diltiazem (CARDIZEM CD) 120 MG 24 hr capsule; Take 2 capsules (240 mg total) by mouth daily. -     furosemide (LASIX) 20 MG tablet; Take 1 tablet (20 mg total) by mouth daily. -     potassium chloride (K-DUR) 10 MEQ tablet; Take 1 tablet (10 mEq total) by mouth daily.  Pleural effusion -     furosemide (LASIX) 20 MG tablet; Take  1 tablet (20 mg total) by mouth daily. -     potassium chloride (K-DUR) 10 MEQ tablet; Take 1 tablet (10 mEq total) by mouth daily.   I have discontinued Michele Diaz's levothyroxine. I have also changed her metoprolol tartrate and diltiazem. Additionally, I am having her start on furosemide, potassium chloride, and levothyroxine. Lastly, I am having her maintain her cholecalciferol, ALPRAZolam, azaTHIOprine, meclizine, triamcinolone cream, escitalopram, XARELTO, losartan, and pantoprazole.  Meds ordered this encounter  Medications  . metoprolol tartrate (LOPRESSOR) 25 MG tablet    Sig: Take 1 tablet (25 mg total)  by mouth 2 (two) times daily.    Dispense:  60 tablet    Refill:  11    Order Specific Question:   Supervising Provider    Answer:   Cassandria Anger [[id number]]  . diltiazem (CARDIZEM CD) 120 MG 24 hr capsule    Sig: Take 2 capsules (240 mg total) by mouth daily.    Dispense:  60 capsule    Refill:  11    Order Specific Question:   Supervising Provider    Answer:   Cassandria Anger [[id number]]  . furosemide (LASIX) 20 MG tablet    Sig: Take 1 tablet (20 mg total) by mouth daily.    Dispense:  30 tablet    Refill:  0    Order Specific Question:   Supervising Provider    Answer:   Cassandria Anger [[id number]]  . potassium chloride (K-DUR) 10 MEQ tablet    Sig: Take 1 tablet (10 mEq total) by mouth daily.    Dispense:  30 tablet    Refill:  0    Order Specific Question:   Supervising Provider    Answer:   Cassandria Anger [[id number]]  . levothyroxine (SYNTHROID, LEVOTHROID) 100 MCG tablet    Sig: Take 1 tablet (100 mcg total) by mouth daily before breakfast.    Dispense:  30 tablet    Refill:  6    Order Specific Question:   Supervising Provider    Answer:   Cassandria Anger [[id number]]   Recent Results (from the past 2160 hour(s))  Basic metabolic panel     Status: Abnormal   Collection Time: 05/27/16  2:36 PM  Result Value Ref Range   Sodium 132 (L) 135 - 145 mEq/L    Potassium 4.6 3.5 - 5.1 mEq/L   Chloride 100 96 - 112 mEq/L   CO2 29 19 - 32 mEq/L   Glucose, Bld 112 (H) 70 - 99 mg/dL   BUN 12 6 - 23 mg/dL   Creatinine, Ser 0.95 0.40 - 1.20 mg/dL   Calcium 8.8 8.4 - 10.5 mg/dL   GFR 59.90 (L) >60.00 mL/min  CBC with Differential/Platelet     Status: Abnormal   Collection Time: 05/27/16  2:36 PM  Result Value Ref Range   WBC 6.6 4.0 - 10.5 K/uL   RBC 3.36 (L) 3.87 - 5.11 Mil/uL   Hemoglobin 9.8 (L) 12.0 - 15.0 g/dL   HCT 28.5 (L) 36.0 - 46.0 %   MCV 85.0 78.0 - 100.0 fl   MCHC 34.2 30.0 - 36.0 g/dL   RDW 13.8 11.5 - 15.5 %   Platelets 206.0 150.0 - 400.0 K/uL   Neutrophils Relative % 73.6 43.0 - 77.0 %   Lymphocytes Relative 10.8 (L) 12.0 - 46.0 %   Monocytes Relative 12.1 (H) 3.0 - 12.0 %   Eosinophils Relative 3.0 0.0 - 5.0 %   Basophils Relative 0.5 0.0 - 3.0 %   Neutro Abs 4.9 1.4 - 7.7 K/uL   Lymphs Abs 0.7 0.7 - 4.0 K/uL   Monocytes Absolute 0.8 0.1 - 1.0 K/uL   Eosinophils Absolute 0.2 0.0 - 0.7 K/uL   Basophils Absolute 0.0 0.0 - 0.1 K/uL  TSH     Status: None   Collection Time: 05/27/16  2:36 PM  Result Value Ref Range   TSH 1.01 0.35 - 4.50 uIU/mL  CBC with Differential/Platelet     Status: Abnormal   Collection Time: 06/30/16  1:45 PM  Result Value Ref  Range   WBC 8.4 4.0 - 10.5 K/uL   RBC 3.36 (L) 3.87 - 5.11 Mil/uL   Hemoglobin 10.0 (L) 12.0 - 15.0 g/dL   HCT 28.6 (L) 36.0 - 46.0 %   MCV 85.4 78.0 - 100.0 fl   MCHC 35.0 30.0 - 36.0 g/dL   RDW 13.8 11.5 - 15.5 %   Platelets 188.0 150.0 - 400.0 K/uL   Neutrophils Relative % 81.1 (H) 43.0 - 77.0 %   Lymphocytes Relative 6.9 Repeated and verified X2. (L) 12.0 - 46.0 %   Monocytes Relative 10.5 3.0 - 12.0 %   Eosinophils Relative 1.3 0.0 - 5.0 %   Basophils Relative 0.2 0.0 - 3.0 %   Neutro Abs 6.9 1.4 - 7.7 K/uL   Lymphs Abs 0.6 (L) 0.7 - 4.0 K/uL   Monocytes Absolute 0.9 0.1 - 1.0 K/uL   Eosinophils Absolute 0.1 0.0 - 0.7 K/uL   Basophils Absolute 0.0 0.0 - 0.1 K/uL    Iron Binding Cap (TIBC)     Status: Abnormal   Collection Time: 06/30/16  1:45 PM  Result Value Ref Range   Iron 37 (L) 45 - 160 ug/dL   UIBC 443 (H) 125 - 400 ug/dL   TIBC 480 (H) 250 - 450 ug/dL   %SAT 8 (L) 11 - 50 %  Hemoccult Cards (X3 cards)     Status: None   Collection Time: 07/09/16 12:48 PM  Result Value Ref Range   OCCULT 1 Negative Negative   OCCULT 2 Negative Negative   OCCULT 3 Negative Negative   OCCULT 4 Negative Negative   OCCULT 5 Negative Negative   Fecal Occult Blood Negative Negative  TSH     Status: Abnormal   Collection Time: 08/01/16  2:06 PM  Result Value Ref Range   TSH 5.45 (H) 0.35 - 4.50 uIU/mL  T4, free     Status: None   Collection Time: 08/01/16  2:06 PM  Result Value Ref Range   Free T4 1.18 0.60 - 1.60 ng/dL    Comment: Specimens from patients who are undergoing biotin therapy and /or ingesting biotin supplements may contain high levels of biotin.  The higher biotin concentration in these specimens interferes with this Free T4 assay.  Specimens that contain high levels  of biotin may cause false high results for this Free T4 assay.  Please interpret results in light of the total clinical presentation of the patient.     Follow-up: Return in about 1 month (around 08/31/2016) for hypothyroidism with [name].  Wilfred Lacy, NP

## 2016-08-01 NOTE — Assessment & Plan Note (Signed)
Normal free T4, TSH of 5.54: Increase Synthroid to 100 g. Follow-up in one month.

## 2016-08-01 NOTE — Progress Notes (Signed)
Pre visit review using our clinic review tool, if applicable. No additional management support is needed unless otherwise documented below in the visit note. 

## 2016-08-04 ENCOUNTER — Encounter: Payer: Self-pay | Admitting: Student

## 2016-08-04 ENCOUNTER — Other Ambulatory Visit: Payer: Self-pay | Admitting: Internal Medicine

## 2016-08-04 ENCOUNTER — Ambulatory Visit (INDEPENDENT_AMBULATORY_CARE_PROVIDER_SITE_OTHER): Payer: Medicare Other | Admitting: Student

## 2016-08-04 VITALS — BP 119/84 | HR 95 | Ht 62.0 in | Wt 112.0 lb

## 2016-08-04 DIAGNOSIS — J90 Pleural effusion, not elsewhere classified: Secondary | ICD-10-CM

## 2016-08-04 DIAGNOSIS — I6349 Cerebral infarction due to embolism of other cerebral artery: Secondary | ICD-10-CM

## 2016-08-04 DIAGNOSIS — Z7901 Long term (current) use of anticoagulants: Secondary | ICD-10-CM

## 2016-08-04 DIAGNOSIS — I480 Paroxysmal atrial fibrillation: Secondary | ICD-10-CM

## 2016-08-04 DIAGNOSIS — E039 Hypothyroidism, unspecified: Secondary | ICD-10-CM

## 2016-08-04 DIAGNOSIS — I10 Essential (primary) hypertension: Secondary | ICD-10-CM | POA: Diagnosis not present

## 2016-08-04 NOTE — Progress Notes (Signed)
Cardiology Office Note    Date:  08/04/2016   ID:  ARAMIS BOGACZ, DOB 1932-08-26, MRN [id number]  PCP:  Walker Kehr, MD  Cardiologist: Dr. Gwenlyn Found    Chief Complaint  Patient presents with  . Follow-up    pt states she has fluid in her lungs     History of Present Illness:    Michele Diaz is a 82 y.o. female with past medical history of HTN, HLD, PAF (s/p TEE/DCCV in 01/2016, on Xarelto) who presents to the office today after being referred by her PCP.   She reports having a flu shot 3 weeks ago. Days following the vaccination she developed nausea and vomiting. Reports consuming little fluid and food due to the frequent nausea. This eventually subsided without further treatment.  Over the next several days, she noticed worsening orthopnea and dyspnea with exertion. She felt as if her heart was racing at times. Denies any chest discomfort. She was seen by her PCP for this last Friday (08/01/2016) and a repeat EKG was obtained which showed atrial fibrillation with a HR of 114. Labs showed an elevated TSH (5.45) with normal Free T4. Synthroid dosing was appropriately increased. For her elevated HR, Cardizem was increased from 120mg  daily to 240mg  daily and Lopressor increased from 12.5mg  BID to 25mg  BID. She was continued on Xarelto for anticoagulation. CXR showed bibasilar atelectasis with small effusions. She was given an Rx for Lasix 20mg  daily with K+ supplementation of 10 mEq daily.  In talking with the patient today, she reports improvement in her respiratory status since Friday but says it is not at baseline. Still having mild orthopnea at night. No lower extremity edema. Denies any palpitations since her Cardizem and Lopressor were increased. She brings in her medication bottles for review but Lasix, K-dur, nor her adjusted Synthroid dose are available. She reports having never had these filled (I called to verify with the Pharmacy they are there and ready for pick-up and have been  since Friday). She says she must have forgot to pick these up following her doctor's appointment.   Past Medical History:  Diagnosis Date  . Allergic rhinitis   . Anxiety   . Arthritis    "hips, hands, feet; pretty much feels like all over" (01/07/2013)  . Asthma   . Breast cancer ([place]) ~ 2008   "right" (01/07/2013)  . Chronic lower back pain   . Daily headache    "related to temporal arteritis" (01/07/2013)  . Depression   . Diverticulitis of colon   . Fibromyalgia   . GERD (gastroesophageal reflux disease)   . Giant cell arteritis ([place]) 2003   devashwar  . Hepatitis, autoimmune ([place])   . Herpes zoster 2011  . Hyperlipidemia    statins increased LFTs  . Hypertension   . Hypothyroid   . PAF (paroxysmal atrial fibrillation) ([place])    a. s/p TEE/DCCV in 01/2016  . PONV (postoperative nausea and vomiting)    "years ago" (01/07/2013)  . Stroke ([place])   . Temporal arteritis ([place])   . Unspecified vitamin D deficiency   . Weight loss 6/11    Past Surgical History:  Procedure Laterality Date  . ABDOMINAL HYSTERECTOMY  1988  . APPENDECTOMY    . BREAST BIOPSY Right   . BREAST LUMPECTOMY Right   . CARDIOVERSION N/A 02/06/2016   Procedure: CARDIOVERSION;  Surgeon: Skeet Latch, MD;  Location: [facility];  Service: Cardiovascular;  Laterality: N/A;  . CHOLECYSTECTOMY    .  DILATION AND CURETTAGE OF UTERUS    . TEE WITHOUT CARDIOVERSION N/A 02/06/2016   Procedure: TRANSESOPHAGEAL ECHOCARDIOGRAM (TEE);  Surgeon: Skeet Latch, MD;  Location: [facility] ENDOSCOPY;  Service: Cardiovascular;  Laterality: N/A;  . TONSILLECTOMY      Current Medications: Outpatient Medications Prior to Visit  Medication Sig Dispense Refill  . ALPRAZolam (XANAX) 0.5 MG tablet Take 0.5-1 tablets (0.25-0.5 mg total) by mouth 2 (two) times daily as needed for anxiety or sleep. 180 tablet 0  . azaTHIOprine (IMURAN) 50 MG tablet Take 1 tablet (50 mg total) by mouth daily. 90 tablet 3  . cholecalciferol (VITAMIN  D) 1000 UNITS tablet Take 1 tablet (1,000 Units total) by mouth daily. 30 tablet 1  . diltiazem (CARDIZEM CD) 120 MG 24 hr capsule Take 2 capsules (240 mg total) by mouth daily. 60 capsule 11  . escitalopram (LEXAPRO) 10 MG tablet Take 10 mg by mouth daily.    . furosemide (LASIX) 20 MG tablet Take 1 tablet (20 mg total) by mouth daily. 30 tablet 0  . levothyroxine (SYNTHROID, LEVOTHROID) 100 MCG tablet Take 1 tablet (100 mcg total) by mouth daily before breakfast. 30 tablet 6  . losartan (COZAAR) 50 MG tablet TAKE 1 TABLET BY MOUTH ONCE DAILY 30 tablet 11  . meclizine (ANTIVERT) 12.5 MG tablet Take 1 tablet (12.5 mg total) by mouth 2 (two) times daily as needed for dizziness. 180 tablet 1  . metoprolol tartrate (LOPRESSOR) 25 MG tablet Take 1 tablet (25 mg total) by mouth 2 (two) times daily. 60 tablet 11  . pantoprazole (PROTONIX) 40 MG tablet Take 1 tablet (40 mg total) by mouth daily. 90 tablet 2  . potassium chloride (K-DUR) 10 MEQ tablet Take 1 tablet (10 mEq total) by mouth daily. 30 tablet 0  . triamcinolone cream (KENALOG) 0.1 % Apply 1 application topically 4 (four) times daily. Use on rash 45 g 3  . XARELTO 15 MG TABS tablet TAKE 1 TABLET BY MOUTH ONCE DAILY 30 tablet 11   No facility-administered medications prior to visit.      Allergies:   Celecoxib; Codeine; Atorvastatin; Rosuvastatin; and Penicillins   Social History   Social History  . Marital status: Widowed    Spouse name: N/A  . Number of children: 2  . Years of education: N/A   Occupational History  . retired Retired   Social History Main Topics  . Smoking status: Never Smoker  . Smokeless tobacco: Never Used  . Alcohol use No  . Drug use: No  . Sexual activity: No   Other Topics Concern  . None   Social History Narrative  . None     Family History:  The patient's family history includes Cancer in her brother and sister; Diabetes in her mother; Heart attack in her mother; Heart disease in her brother  and sister; Hypertension in her other.   Review of Systems:   Please see the history of present illness.     General:  No chills, fever, night sweats or weight changes.  Cardiovascular:  No chest pain, dyspnea on exertion, edema, paroxysmal nocturnal dyspnea. Positive for palpitations and orthopnea.  Dermatological: No rash, lesions/masses Respiratory: No cough, Positive for dyspnea Urologic: No hematuria, dysuria Abdominal:   No diarrhea, bright red blood per rectum, melena, or hematemesis. Positive for nausea and vomiting.  Neurologic:  No visual changes, wkns, changes in mental status. All other systems reviewed and are otherwise negative except as noted above.   Physical Exam:  VS:  BP 119/84 (BP Location: Left Arm, Patient Position: Sitting, Cuff Size: Normal)   Pulse 95   Ht 5\' 2"  (1.575 m)   Wt 112 lb (50.8 kg)   SpO2 93%   BMI 20.49 kg/m    General: Thin-elderly Caucasian female appearing in no acute distress. Head: Normocephalic, atraumatic, sclera non-icteric, no xanthomas, nares are without discharge.  Neck: No carotid bruits. JVD not elevated.  Lungs: Respirations regular and unlabored, faint rales along right lung base.  Heart: Irregularly irregular. No S3 or S4.  No murmur, no rubs, or gallops appreciated. Abdomen: Soft, non-tender, non-distended with normoactive bowel sounds. No hepatomegaly. No rebound/guarding. No obvious abdominal masses. Msk:  Strength and tone appear normal for age. No joint deformities or effusions. Extremities: No clubbing or cyanosis. No edema.  Distal pedal pulses are 2+ bilaterally. Neuro: Alert and oriented X 3. Moves all extremities spontaneously. No focal deficits noted. Psych:  Responds to questions appropriately with a normal affect. Skin: No rashes or lesions noted  Wt Readings from Last 3 Encounters:  08/04/16 112 lb (50.8 kg)  08/01/16 112 lb (50.8 kg)  05/27/16 113 lb (51.3 kg)     Studies/Labs Reviewed:   EKG:  EKG is  ordered today. The EKG ordered today demonstrates atrial fibrillation, HR 95, no acute ST or T-wave changes.   Recent Labs: 02/03/2016: B Natriuretic Peptide 305.3 02/05/2016: ALT 26 02/07/2016: Magnesium 1.8 05/27/2016: BUN 12; Creatinine, Ser 0.95; Potassium 4.6; Sodium 132 06/30/2016: Hemoglobin 10.0; Platelets 188.0 08/01/2016: TSH 5.45   Lipid Panel    Component Value Date/Time   CHOL 178 01/08/2013 0645   TRIG 226 (H) 01/08/2013 0645   HDL 58 01/08/2013 0645   CHOLHDL 3.1 01/08/2013 0645   VLDL 45 (H) 01/08/2013 0645   LDLCALC 75 01/08/2013 0645   LDLDIRECT 125.9 10/06/2007 1019    Additional studies/ records that were reviewed today include:   Echocardiogram: 02/04/2016 Study Conclusions  - Left ventricle: The cavity size was normal. Wall thickness was   normal. Systolic function was normal. The estimated ejection   fraction was in the range of 55% to 60%. The study is not   technically sufficient to allow evaluation of LV diastolic   function. - Mitral valve: There was mild regurgitation. - Right atrium: The atrium was mildly dilated.  CXR: 08/01/2016 There are small bilateral pleural effusions. Bibasilar atelectasis. Mild cardiomegaly. No acute bony abnormality.  IMPRESSION: Cardiomegaly.  Bibasilar atelectasis with small effusions.  Assessment:    1. PAF (paroxysmal atrial fibrillation) ([place])   2. Pleural effusion   3. Essential hypertension   4. Chronic anticoagulation   5. Hypothyroidism, unspecified type      Plan:   In order of problems listed above:  1. Paroxysmal Atrial Fibrillation - has known PAF with recent TEE/DCCV in 01/2016. She developed a viral illness 3 weeks ago with  associated nausea and vomiting. Since then, she has noted occasional palpitations and recently started to experience dyspnea with exertion and orthopnea.  - seen by her PCP on 08/01/2016 and noted to be back in atrial fibrillation with a HR of 114. Cardizem CD was  increased from 120mg  daily to 240mg  daily and Lopressor was increased from 12.5mg  BID to 25mg  BID. Has been continued on Xarelto for anticoagulation. - HR improved at 95 during today's visit and at 75 by recheck after sitting for 5 minutes. She denies any palpitations at this time.  - I reviewed with her the etiology of this is  likely her recent viral illness, although her hypothyroidism could be contributing as well (although hyperthyroidism is the usual culprit). We discussed medication titration vs. antiarrhythmic therapy with a future DCCV. With her being asymptomatic at this time, she wishes to try a rate-control strategy. With her medications just being adjusted 3 days prior, we will continue with the current dosing for now and see her back in 2 weeks for further titration of her medications and assessment of her symptoms.   2. Small Bilateral Pleural Effusions - likely secondary to recurrent atrial fibrillation with uncontrolled HR in the past 2-3 weeks. - recent CXR on 11/17 showed bibasilar atelectasis with small effusions. She was given an Rx for Lasix 20mg  daily with K+ supplementation of 10 mEq daily but has not picked this up yet.  - she does report occasional orthopnea and dyspnea, but significantly improved since Friday. Has mild rales on exam but does not significantly volume overloaded. No significant JVD or LEE. Weights have remained stable at 112 lbs.  - I advised her to take the Lasix 20mg  daily with K+ supplementation for the next 5 days and then on an as-needed basis.  3. HTN - BP well-controlled. - continue current medication regimen (the patient was encouraged to check her BP at home with the recent dose increases of Cardizem and Lopressor).   4. Chronic Anticoagulation - This patients CHA2DS2-VASc Score and unadjusted Ischemic Stroke Rate (% per year) is equal to 4.8 % stroke rate/year from a score of 4 (HTN, Female, Age (2)).  - she denies any evidence of active bleeding.  Continue Xarelto for anticoagulation.   5. Hypothyroidism - recent labs on 11/17 showed an elevated TSH (5.45) with normal Free T4. - Synthroid increased from 75 mcg daily to 157mcg daily. The patient has yet to pick up this Rx and was encouraged to do so today.    Medication Adjustments/Labs and Tests Ordered: Current medicines are reviewed at length with the patient today.  Concerns regarding medicines are outlined above.  Medication changes, Labs and Tests ordered today are listed in the Patient Instructions below. Patient Instructions  PLEASE TAKE LASIX 20 MG AND POTASSIUM  FOR THE NEXT 5 DAYS AND THEN USE AS NEEDED  NO OTHER CHANGES AT CURRENT TIME  Your physician recommends that you schedule a follow-up appointment in: [place]    Signed, Erma Heritage, [place]  08/04/2016 6:14 PM    [facility] [address], [place], [place]  [address] Phone: [phone]; Fax: [phone]  [address], [place], [place] [address] Phone: [phone]

## 2016-08-04 NOTE — Patient Instructions (Signed)
PLEASE TAKE LASIX 20 MG AND POTASSIUM  FOR THE NEXT 5 DAYS AND THEN USE AS NEEDED  NO OTHER CHANGES AT CURRENT TIME    Your physician recommends that you schedule a follow-up appointment in: [place]

## 2016-08-18 ENCOUNTER — Encounter: Payer: Self-pay | Admitting: Student

## 2016-08-18 ENCOUNTER — Ambulatory Visit (INDEPENDENT_AMBULATORY_CARE_PROVIDER_SITE_OTHER): Payer: Medicare Other | Admitting: Student

## 2016-08-18 VITALS — BP 120/72 | HR 87 | Ht 62.0 in | Wt 111.0 lb

## 2016-08-18 DIAGNOSIS — I10 Essential (primary) hypertension: Secondary | ICD-10-CM

## 2016-08-18 DIAGNOSIS — I6349 Cerebral infarction due to embolism of other cerebral artery: Secondary | ICD-10-CM

## 2016-08-18 DIAGNOSIS — I5032 Chronic diastolic (congestive) heart failure: Secondary | ICD-10-CM | POA: Diagnosis not present

## 2016-08-18 DIAGNOSIS — I481 Persistent atrial fibrillation: Secondary | ICD-10-CM

## 2016-08-18 DIAGNOSIS — R0609 Other forms of dyspnea: Secondary | ICD-10-CM

## 2016-08-18 DIAGNOSIS — I4819 Other persistent atrial fibrillation: Secondary | ICD-10-CM

## 2016-08-18 DIAGNOSIS — Z7901 Long term (current) use of anticoagulants: Secondary | ICD-10-CM

## 2016-08-18 MED ORDER — POTASSIUM CHLORIDE ER 10 MEQ PO TBCR: 10.0000 meq | EXTENDED_RELEASE_TABLET | Freq: Every day | ORAL | 0 refills | Status: DC | PRN

## 2016-08-18 MED ORDER — FUROSEMIDE 20 MG PO TABS: 20.0000 mg | ORAL_TABLET | Freq: Every day | ORAL | 0 refills | Status: DC | PRN

## 2016-08-18 NOTE — Progress Notes (Signed)
Cardiology Office Note    Date:  08/18/2016   ID:  Michele Diaz, DOB [birth year], MRN [id number]  PCP:  Walker Kehr, MD  Cardiologist: Dr. Gwenlyn Found   Chief Complaint  Patient presents with  . Follow-up    2weeks  . Shortness of Breath    occassionally.    History of Present Illness:    Michele Diaz is a 82 y.o. female with past medical history of HTN, HLD, PAF (s/p TEE/DCCV in 01/2016, on Xarelto) who presents to the office today for follow-up.  Was last seen by myself on 08/04/2016 for worsening orthopnea and dyspnea with exertion. A recent EKG on 08/01/2016 performed by her PCP showed recurrent atrial fibrillation with a HR of 114. PO Cardizem and Lopressor dosing along with Synthroid were increased at that time. CXR showed small pleural effusions and Rx for Lasix 20mg  daily was given but at the time of her appointment with me, she had not yet picked up this Rx. She denied any palpitations and HR was improved to 95 at the time of our visit. She was continued on Cardizem CD 240mg  daily and Lopressor 25mg  BID as she preferred a rate-control strategy over repeat DCCV at that time. Was instructed to take PO Lasix 20mg  daily for the next 5 days and then on an as-needed basis.   In talking with the patient today, she reports significant improvement in her symptoms. She does not weigh regularly at home and says weight has gone down 4 pounds on her home scales in the past few weeks. Weight is at 111 lbs in the office today (112 lbs two weeks prior). She reports her breathing is at baseline. Has occasional episodes of dyspnea with exertion which has been constant over the past few years but not as severe as this was two weeks prior. She denies any chest discomfort or palpitations. Unaware when she is in atrial flutter. Instead of only taking Lasix for 5 days and then as needed, she has continued taking it daily since her last office visit.  Did miss one dose of her Xarelto two nights ago, as the  pill blended in on her granite counter tops and she did not see it until the following day. Otherwise, she reports good compliance with her medications.    Past Medical History:  Diagnosis Date  . Allergic rhinitis   . Anxiety   . Arthritis    "hips, hands, feet; pretty much feels like all over" (01/07/2013)  . Asthma   . Breast cancer ([place]) ~ 2008   "right" (01/07/2013)  . Chronic lower back pain   . Daily headache    "related to temporal arteritis" (01/07/2013)  . Depression   . Diverticulitis of colon   . Fibromyalgia   . GERD (gastroesophageal reflux disease)   . Giant cell arteritis ([place]) 2003   devashwar  . Hepatitis, autoimmune ([place])   . Herpes zoster 2011  . Hyperlipidemia    statins increased LFTs  . Hypertension   . Hypothyroid   . PAF (paroxysmal atrial fibrillation) ([place])    a. s/p TEE/DCCV in 01/2016  . PONV (postoperative nausea and vomiting)    "years ago" (01/07/2013)  . Stroke ([place])   . Temporal arteritis ([place])   . Unspecified vitamin D deficiency   . Weight loss 6/11    Past Surgical History:  Procedure Laterality Date  . ABDOMINAL HYSTERECTOMY  1988  . APPENDECTOMY    . BREAST BIOPSY Right   .  BREAST LUMPECTOMY Right   . CARDIOVERSION N/A 02/06/2016   Procedure: CARDIOVERSION;  Surgeon: Michele Latch, MD;  Location: [facility];  Service: Cardiovascular;  Laterality: N/A;  . CHOLECYSTECTOMY    . DILATION AND CURETTAGE OF UTERUS    . TEE WITHOUT CARDIOVERSION N/A 02/06/2016   Procedure: TRANSESOPHAGEAL ECHOCARDIOGRAM (TEE);  Surgeon: Michele Latch, MD;  Location: [facility] ENDOSCOPY;  Service: Cardiovascular;  Laterality: N/A;  . TONSILLECTOMY      Current Medications: Outpatient Medications Prior to Visit  Medication Sig Dispense Refill  . azaTHIOprine (IMURAN) 50 MG tablet Take 1 tablet (50 mg total) by mouth daily. 90 tablet 3  . cholecalciferol (VITAMIN D) 1000 UNITS tablet Take 1 tablet (1,000 Units total) by mouth daily. 30 tablet 1  .  diltiazem (CARDIZEM CD) 120 MG 24 hr capsule Take 2 capsules (240 mg total) by mouth daily. 60 capsule 11  . escitalopram (LEXAPRO) 10 MG tablet Take 10 mg by mouth daily.    Marland Kitchen escitalopram (LEXAPRO) 10 MG tablet TAKE 1 TABLET BY MOUTH DAILY 30 tablet 5  . levothyroxine (SYNTHROID, LEVOTHROID) 100 MCG tablet Take 1 tablet (100 mcg total) by mouth daily before breakfast. 30 tablet 6  . losartan (COZAAR) 50 MG tablet TAKE 1 TABLET BY MOUTH ONCE DAILY 30 tablet 11  . meclizine (ANTIVERT) 12.5 MG tablet Take 1 tablet (12.5 mg total) by mouth 2 (two) times daily as needed for dizziness. 180 tablet 1  . metoprolol tartrate (LOPRESSOR) 25 MG tablet Take 1 tablet (25 mg total) by mouth 2 (two) times daily. 60 tablet 11  . pantoprazole (PROTONIX) 40 MG tablet Take 1 tablet (40 mg total) by mouth daily. 90 tablet 2  . triamcinolone cream (KENALOG) 0.1 % Apply 1 application topically 4 (four) times daily. Use on rash 45 g 3  . XARELTO 15 MG TABS tablet TAKE 1 TABLET BY MOUTH ONCE DAILY 30 tablet 11  . furosemide (LASIX) 20 MG tablet Take 1 tablet (20 mg total) by mouth daily. 30 tablet 0  . potassium chloride (K-DUR) 10 MEQ tablet Take 1 tablet (10 mEq total) by mouth daily. 30 tablet 0  . ALPRAZolam (XANAX) 0.5 MG tablet Take 0.5-1 tablets (0.25-0.5 mg total) by mouth 2 (two) times daily as needed for anxiety or sleep. 180 tablet 0   No facility-administered medications prior to visit.      Allergies:   Celecoxib; Codeine; Atorvastatin; Rosuvastatin; and Penicillins   Social History   Social History  . Marital status: Widowed    Spouse name: N/A  . Number of children: 2  . Years of education: N/A   Occupational History  . retired Retired   Social History Main Topics  . Smoking status: Never Smoker  . Smokeless tobacco: Never Used  . Alcohol use No  . Drug use: No  . Sexual activity: No   Other Topics Concern  . None   Social History Narrative  . None     Family History:  The  patient's family history includes Cancer in her brother and sister; Diabetes in her mother; Heart attack in her mother; Heart disease in her brother and sister; Hypertension in her other.   Review of Systems:   Please see the history of present illness.     General:  No chills, fever, night sweats or weight changes.  Cardiovascular:  No chest pain, dyspnea on exertion, edema, orthopnea, palpitations, paroxysmal nocturnal dyspnea. Dermatological: No rash, lesions/masses Respiratory: No cough, Positive for mild baseline dyspnea Urologic:  No hematuria, dysuria Abdominal:   No nausea, vomiting, diarrhea, bright red blood per rectum, melena, or hematemesis Neurologic:  No visual changes, wkns, changes in mental status. All other systems reviewed and are otherwise negative except as noted above.   Physical Exam:    VS:  BP 120/72   Pulse 87   Ht 5\' 2"  (1.575 m)   Wt 111 lb (50.3 kg)   BMI 20.30 kg/m    General: Thin-appearing Elderly Caucasian female currently in no acute distress. Head: Normocephalic, atraumatic, sclera non-icteric, no xanthomas, nares are without discharge.  Neck: No carotid bruits. JVD not elevated.  Lungs: Respirations regular and unlabored, without wheezes or rales.  Heart: Irregularly irregular. No S3 or S4.  No murmur, no rubs, or gallops appreciated. Abdomen: Soft, non-tender, non-distended with normoactive bowel sounds. No hepatomegaly. No rebound/guarding. No obvious abdominal masses. Msk:  Strength and tone appear normal for age. No joint deformities or effusions. Extremities: No clubbing or cyanosis. No edema.  Distal pedal pulses are 2+ bilaterally. Neuro: Alert and oriented X 3. Moves all extremities spontaneously. No focal deficits noted. Psych:  Responds to questions appropriately with a normal affect. Skin: No rashes or lesions noted  Wt Readings from Last 3 Encounters:  08/18/16 111 lb (50.3 kg)  08/04/16 112 lb (50.8 kg)  08/01/16 112 lb (50.8 kg)      Studies/Labs Reviewed:   EKG:  EKG is ordered today.  The ekg ordered today demonstrates atrial flutter, HR 87, with occasional PVC's.   Recent Labs: 02/03/2016: B Natriuretic Peptide 305.3 02/05/2016: ALT 26 02/07/2016: Magnesium 1.8 05/27/2016: BUN 12; Creatinine, Ser 0.95; Potassium 4.6; Sodium 132 06/30/2016: Hemoglobin 10.0; Platelets 188.0 08/01/2016: TSH 5.45   Lipid Panel    Component Value Date/Time   CHOL 178 01/08/2013 0645   TRIG 226 (H) 01/08/2013 0645   HDL 58 01/08/2013 0645   CHOLHDL 3.1 01/08/2013 0645   VLDL 45 (H) 01/08/2013 0645   LDLCALC 75 01/08/2013 0645   LDLDIRECT 125.9 10/06/2007 1019    Additional studies/ records that were reviewed today include:   Echocardiogram: 02/04/2016 Study Conclusions  - Left ventricle: The cavity size was normal. Wall thickness was normal. Systolic function was normal. The estimated ejection fraction was in the range of 55% to 60%. The study is not technically sufficient to allow evaluation of LV diastolic function. - Mitral valve: There was mild regurgitation. - Right atrium: The atrium was mildly dilated.  Assessment:    1. Chronic diastolic CHF (congestive heart failure) ([place])   2. Dyspnea on exertion   3. Persistent atrial fibrillation ([place])   4. Essential hypertension   5. Chronic anticoagulation      Plan:   In order of problems listed above:  1. Chronic Diastolic CHF/ Dyspnea with Exertion - seen in the office two weeks ago for volume overload with recent CXR showing pleural effusions. Started on PO Lasix 20mg  daily and instructed to take this for 5 days and then PRN for lower extremity edema.  - she has continued to take PO Lasix 20mg  daily since her office visit. She does not appear volume overloaded on exam. Breathing is close to baseline. Denies any orthopnea or PND.  - will change Lasix to PRN dosing to take as needed for lower extremity edema or weight gain > 3 lbs overnight or > 5 lbs  in one week. She voiced understanding of this plan.   2. Persistent Atrial Fibrillation/ Atrial Flutter - has known PAF with recent  TEE/DCCV in 01/2016. On 11/17, she was noted to be back in atrial fibrillation with a HR of 114. Cardizem CD increased to 240mg  daily and Lopressor was increased from 12.5mg  BID to 25mg  BID. - EKG today shows persistent atrial flutter with HR being well-controlled at 87. She denies any recent chest discomfort or palpitations since rate-controlling medications were increased.  - with her being asymptomatic, will continue with rate-control strategy, especially in the setting of her recently missing a dose of Xarelto. She would not be a current candidate for DCCV for at least 30 days and she does not currently wish to pursue this.   3. HTN - BP well-controlled at 120/72 at today's visit.  - continue Cardizem CD 240mg  daily, Cozaar 50mg  daily, and Lopressor 25mg  BID.   4. Chronic Anticoagulation - This patients CHA2DS2-VASc Score and unadjusted Ischemic Stroke Rate (% per year) is equal to 4.8 % stroke rate/year from a score of 4 (HTN, Female, Age (2)). On Xarelto for anticoagulation. Has missed one dose in the past week. Importance of compliance reviewed with the patient. - no evidence of active bleeding.    Medication Adjustments/Labs and Tests Ordered: Current medicines are reviewed at length with the patient today.  Concerns regarding medicines are outlined above.  Medication changes, Labs and Tests ordered today are listed in the Patient Instructions below. Patient Instructions  Bernerd Pho, PA-C has recommended making the following medication changes: 1. CHANGE Furosemide - take 1 tablet daily as needed for lower extremity edema or weight gain greater than 3 pounds in 24 hours 2. CHANGE Potassium - take 1 tablet daily as needed for lower extremity edema or weight gain greater than 3 pounds in 24 hours  Your physician recommends that you schedule a  follow-up appointment in 3 months with Dr Gwenlyn Found.  If you need a refill on your cardiac medications before your next appointment, please call your pharmacy.    Arna Medici, PA  08/18/2016 4:46 PM    [facility] [place], [place], [place]  [address] Phone: [phone]; Fax: [phone]  [address] [place], [place] [address] Phone: [phone]

## 2016-08-18 NOTE — Patient Instructions (Addendum)
Bernerd Pho, PA-C has recommended making the following medication changes: 1. CHANGE Furosemide - take 1 tablet daily as needed for lower extremity edema or weight gain greater than 3 pounds in 24 hours 2. CHANGE Potassium - take 1 tablet daily as needed for lower extremity edema or weight gain greater than 3 pounds in 24 hours  Your physician recommends that you schedule a follow-up appointment in 3 months with Dr Gwenlyn Found.  If you need a refill on your cardiac medications before your next appointment, please call your pharmacy.

## 2016-08-19 ENCOUNTER — Ambulatory Visit: Payer: Medicare Other | Admitting: Cardiovascular Disease

## 2016-08-26 ENCOUNTER — Ambulatory Visit: Payer: Medicare Other | Admitting: Internal Medicine

## 2016-08-27 ENCOUNTER — Ambulatory Visit (INDEPENDENT_AMBULATORY_CARE_PROVIDER_SITE_OTHER): Payer: Medicare Other | Admitting: Internal Medicine

## 2016-08-27 ENCOUNTER — Encounter: Payer: Self-pay | Admitting: Internal Medicine

## 2016-08-27 VITALS — BP 114/80 | HR 96 | Ht 62.0 in | Wt 110.0 lb

## 2016-08-27 DIAGNOSIS — I6349 Cerebral infarction due to embolism of other cerebral artery: Secondary | ICD-10-CM

## 2016-08-27 DIAGNOSIS — S40011A Contusion of right shoulder, initial encounter: Secondary | ICD-10-CM

## 2016-08-27 DIAGNOSIS — I4891 Unspecified atrial fibrillation: Secondary | ICD-10-CM | POA: Diagnosis not present

## 2016-08-27 DIAGNOSIS — E034 Atrophy of thyroid (acquired): Secondary | ICD-10-CM

## 2016-08-27 DIAGNOSIS — F341 Dysthymic disorder: Secondary | ICD-10-CM

## 2016-08-27 DIAGNOSIS — I10 Essential (primary) hypertension: Secondary | ICD-10-CM | POA: Diagnosis not present

## 2016-08-27 MED ORDER — RIVAROXABAN 15 MG PO TABS: 15.0000 mg | ORAL_TABLET | Freq: Every day | ORAL | 11 refills | Status: DC

## 2016-08-27 NOTE — Assessment & Plan Note (Signed)
Losartan, Cardizem CD, Lopressor

## 2016-08-27 NOTE — Assessment & Plan Note (Signed)
Labs incl TSH in 3 mo Levothroid was adjusted

## 2016-08-27 NOTE — Assessment & Plan Note (Signed)
Lexapro Xanax prn  Potential benefits of a long term benzodiazepines  use as well as potential risks  and complications were explained to the patient and were aknowledged. 

## 2016-08-27 NOTE — Assessment & Plan Note (Signed)
Xarelto and other meds Labs incl TSH in 3 mo

## 2016-08-27 NOTE — Assessment & Plan Note (Signed)
Pt declined X ray Use heat

## 2016-08-27 NOTE — Progress Notes (Signed)
Subjective:  Patient ID: Michele Diaz, female    DOB: 12-21-1931  Age: 82 y.o. MRN: [id number]  CC: No chief complaint on file.   HPI KESS FETTY presents for HTN, depression, hypothyroidism f/u. She fell out of bed a week ago  Outpatient Medications Prior to Visit  Medication Sig Dispense Refill  . azaTHIOprine (IMURAN) 50 MG tablet Take 1 tablet (50 mg total) by mouth daily. 90 tablet 3  . cholecalciferol (VITAMIN D) 1000 UNITS tablet Take 1 tablet (1,000 Units total) by mouth daily. 30 tablet 1  . diltiazem (CARDIZEM CD) 120 MG 24 hr capsule Take 2 capsules (240 mg total) by mouth daily. 60 capsule 11  . escitalopram (LEXAPRO) 10 MG tablet Take 10 mg by mouth daily.    Marland Kitchen escitalopram (LEXAPRO) 10 MG tablet TAKE 1 TABLET BY MOUTH DAILY 30 tablet 5  . furosemide (LASIX) 20 MG tablet Take 1 tablet (20 mg total) by mouth daily as needed for edema. 30 tablet 0  . levothyroxine (SYNTHROID, LEVOTHROID) 100 MCG tablet Take 1 tablet (100 mcg total) by mouth daily before breakfast. 30 tablet 6  . losartan (COZAAR) 50 MG tablet TAKE 1 TABLET BY MOUTH ONCE DAILY 30 tablet 11  . meclizine (ANTIVERT) 12.5 MG tablet Take 1 tablet (12.5 mg total) by mouth 2 (two) times daily as needed for dizziness. 180 tablet 1  . metoprolol tartrate (LOPRESSOR) 25 MG tablet Take 1 tablet (25 mg total) by mouth 2 (two) times daily. 60 tablet 11  . pantoprazole (PROTONIX) 40 MG tablet Take 1 tablet (40 mg total) by mouth daily. 90 tablet 2  . potassium chloride (K-DUR) 10 MEQ tablet Take 1 tablet (10 mEq total) by mouth daily as needed. 30 tablet 0  . triamcinolone cream (KENALOG) 0.1 % Apply 1 application topically 4 (four) times daily. Use on rash 45 g 3  . XARELTO 15 MG TABS tablet TAKE 1 TABLET BY MOUTH ONCE DAILY 30 tablet 11  . ALPRAZolam (XANAX) 0.5 MG tablet Take 0.5-1 tablets (0.25-0.5 mg total) by mouth 2 (two) times daily as needed for anxiety or sleep. 180 tablet 0   No facility-administered medications  prior to visit.     ROS Review of Systems  Constitutional: Positive for fatigue. Negative for activity change, appetite change, chills and unexpected weight change.  HENT: Negative for congestion, mouth sores and sinus pressure.   Eyes: Negative for visual disturbance.  Respiratory: Negative for cough and chest tightness.   Gastrointestinal: Negative for abdominal pain and nausea.  Genitourinary: Negative for difficulty urinating, frequency and vaginal pain.  Musculoskeletal: Positive for arthralgias. Negative for back pain and gait problem.  Skin: Negative for pallor and rash.  Neurological: Negative for dizziness, tremors, weakness, numbness and headaches.  Psychiatric/Behavioral: Negative for confusion, sleep disturbance and suicidal ideas. The patient is nervous/anxious.     Objective:  BP 114/80   Pulse 96   Ht 5\' 2"  (1.575 m)   Wt 110 lb (49.9 kg)   SpO2 93%   BMI 20.12 kg/m   BP Readings from Last 3 Encounters:  08/27/16 114/80  08/18/16 120/72  08/04/16 119/84    Wt Readings from Last 3 Encounters:  08/27/16 110 lb (49.9 kg)  08/18/16 111 lb (50.3 kg)  08/04/16 112 lb (50.8 kg)    Physical Exam  Constitutional: She appears well-developed. No distress.  HENT:  Head: Normocephalic.  Right Ear: External ear normal.  Left Ear: External ear normal.  Nose: Nose normal.  Mouth/Throat: Oropharynx is clear and moist.  Eyes: Conjunctivae are normal. Pupils are equal, round, and reactive to light. Right eye exhibits no discharge. Left eye exhibits no discharge.  Neck: Normal range of motion. Neck supple. No JVD present. No tracheal deviation present. No thyromegaly present.  Cardiovascular: Normal rate, regular rhythm and normal heart sounds.   Pulmonary/Chest: No stridor. No respiratory distress. She has no wheezes.  Abdominal: Soft. Bowel sounds are normal. She exhibits no distension and no mass. There is no tenderness. There is no rebound and no guarding.    Musculoskeletal: She exhibits no edema or tenderness.  Lymphadenopathy:    She has no cervical adenopathy.  Neurological: She displays normal reflexes. No cranial nerve deficit. She exhibits normal muscle tone. Coordination normal.  Skin: No rash noted. No erythema.  Psychiatric: She has a normal mood and affect. Her behavior is normal. Judgment and thought content normal.  L cheek and shoulder w/an old bruise  Lab Results  Component Value Date   WBC 8.4 06/30/2016   HGB 10.0 (L) 06/30/2016   HCT 28.6 (L) 06/30/2016   PLT 188.0 06/30/2016   GLUCOSE 112 (H) 05/27/2016   CHOL 178 01/08/2013   TRIG 226 (H) 01/08/2013   HDL 58 01/08/2013   LDLDIRECT 125.9 10/06/2007   LDLCALC 75 01/08/2013   ALT 26 02/05/2016   AST 43 (H) 02/05/2016   NA 132 (L) 05/27/2016   K 4.6 05/27/2016   CL 100 05/27/2016   CREATININE 0.95 05/27/2016   BUN 12 05/27/2016   CO2 29 05/27/2016   TSH 5.45 (H) 08/01/2016   INR 1.2 (H) 08/22/2013   HGBA1C 5.3 01/08/2013    Dg Chest 2 View  Result Date: 08/01/2016 CLINICAL DATA:  Shortness of breath with exertion for 2 weeks. Right flank pain. EXAM: CHEST  2 VIEW COMPARISON:  02/07/2016 FINDINGS: There are small bilateral pleural effusions. Bibasilar atelectasis. Mild cardiomegaly. No acute bony abnormality. IMPRESSION: Cardiomegaly.  Bibasilar atelectasis with small effusions. Electronically Signed   By: Rolm Baptise M.D.   On: 08/01/2016 14:48    Assessment & Plan:   There are no diagnoses linked to this encounter. I am having [name] maintain her cholecalciferol, ALPRAZolam, azaTHIOprine, meclizine, triamcinolone cream, escitalopram, XARELTO, losartan, pantoprazole, metoprolol tartrate, diltiazem, levothyroxine, escitalopram, furosemide, and potassium chloride.  No orders of the defined types were placed in this encounter.    Follow-up: No Follow-up on file.  Walker Kehr, MD

## 2016-08-27 NOTE — Assessment & Plan Note (Signed)
Xarelto, Losartan, Cardizem CD, Lopressor

## 2016-08-27 NOTE — Progress Notes (Signed)
Pre visit review using our clinic review tool, if applicable. No additional management support is needed unless otherwise documented below in the visit note. 

## 2016-09-10 ENCOUNTER — Other Ambulatory Visit: Payer: Self-pay | Admitting: Internal Medicine

## 2016-09-10 NOTE — Telephone Encounter (Signed)
Routing to dr john in the absence of dr plotnikov, please advise, thanks 

## 2016-09-10 NOTE — Telephone Encounter (Signed)
Done hardcopy to [place] quantity done this time (60 instead of 180) and this is done in Dr [name]'s abscense for a controlled substance

## 2016-09-11 NOTE — Telephone Encounter (Signed)
Faxed script back to [facility].../lmb 

## 2016-09-20 ENCOUNTER — Other Ambulatory Visit: Payer: Self-pay | Admitting: Internal Medicine

## 2016-09-22 NOTE — Telephone Encounter (Signed)
Ok to Rf Levothyroxine 75 mcg? Chart reflects 100 mcg.

## 2016-11-04 ENCOUNTER — Telehealth: Payer: Self-pay | Admitting: *Deleted

## 2016-11-04 ENCOUNTER — Ambulatory Visit: Payer: Medicare Other | Admitting: Internal Medicine

## 2016-11-04 NOTE — Telephone Encounter (Signed)
OK to fill this/these prescription(s) with additional refills x11  Thank you!  

## 2016-11-04 NOTE — Telephone Encounter (Signed)
Rec'd call pt states she need refill on her albuterol inhaler. Med not on med list pls advise.../lm,b

## 2016-11-05 MED ORDER — ALBUTEROL SULFATE HFA 108 (90 BASE) MCG/ACT IN AERS: 1.0000 | INHALATION_SPRAY | Freq: Four times a day (QID) | RESPIRATORY_TRACT | 11 refills | Status: DC | PRN

## 2016-11-05 NOTE — Telephone Encounter (Signed)
Notified pt rx has been sent to [facility].../lmb 

## 2016-11-07 ENCOUNTER — Ambulatory Visit: Payer: Medicare Other | Admitting: Internal Medicine

## 2016-11-11 ENCOUNTER — Ambulatory Visit: Payer: Medicare Other | Admitting: Internal Medicine

## 2016-11-13 ENCOUNTER — Ambulatory Visit: Payer: Medicare Other | Admitting: Internal Medicine

## 2016-11-18 ENCOUNTER — Ambulatory Visit: Payer: Medicare Other | Admitting: Nurse Practitioner

## 2016-11-18 ENCOUNTER — Ambulatory Visit: Payer: Medicare Other | Admitting: Cardiovascular Disease

## 2016-11-20 ENCOUNTER — Other Ambulatory Visit: Payer: Self-pay | Admitting: Internal Medicine

## 2016-11-26 ENCOUNTER — Ambulatory Visit: Payer: Medicare Other | Admitting: Internal Medicine

## 2016-11-28 ENCOUNTER — Observation Stay (HOSPITAL_COMMUNITY): Payer: Medicare Other

## 2016-11-28 ENCOUNTER — Encounter (HOSPITAL_COMMUNITY): Payer: Self-pay | Admitting: Family Medicine

## 2016-11-28 ENCOUNTER — Ambulatory Visit (INDEPENDENT_AMBULATORY_CARE_PROVIDER_SITE_OTHER): Payer: Medicare Other | Admitting: Internal Medicine

## 2016-11-28 ENCOUNTER — Encounter: Payer: Self-pay | Admitting: Internal Medicine

## 2016-11-28 ENCOUNTER — Inpatient Hospital Stay (HOSPITAL_COMMUNITY)
Admission: AD | Admit: 2016-11-28 | Discharge: 2016-12-04 | DRG: 291 | Disposition: A | Discharge status: Skilled Nursing Facility | Payer: Medicare Other | Source: Ambulatory Visit | Attending: Internal Medicine | Admitting: Internal Medicine

## 2016-11-28 DIAGNOSIS — E871 Hypo-osmolality and hyponatremia: Secondary | ICD-10-CM | POA: Diagnosis present

## 2016-11-28 DIAGNOSIS — R410 Disorientation, unspecified: Secondary | ICD-10-CM | POA: Insufficient documentation

## 2016-11-28 DIAGNOSIS — F329 Major depressive disorder, single episode, unspecified: Secondary | ICD-10-CM | POA: Diagnosis present

## 2016-11-28 DIAGNOSIS — I5033 Acute on chronic diastolic (congestive) heart failure: Secondary | ICD-10-CM | POA: Diagnosis not present

## 2016-11-28 DIAGNOSIS — I10 Essential (primary) hypertension: Secondary | ICD-10-CM | POA: Diagnosis present

## 2016-11-28 DIAGNOSIS — N390 Urinary tract infection, site not specified: Secondary | ICD-10-CM | POA: Diagnosis not present

## 2016-11-28 DIAGNOSIS — I110 Hypertensive heart disease with heart failure: Principal | ICD-10-CM | POA: Diagnosis present

## 2016-11-28 DIAGNOSIS — T381X5A Adverse effect of thyroid hormones and substitutes, initial encounter: Secondary | ICD-10-CM | POA: Diagnosis present

## 2016-11-28 DIAGNOSIS — B9620 Unspecified Escherichia coli [E. coli] as the cause of diseases classified elsewhere: Secondary | ICD-10-CM | POA: Diagnosis present

## 2016-11-28 DIAGNOSIS — Z8673 Personal history of transient ischemic attack (TIA), and cerebral infarction without residual deficits: Secondary | ICD-10-CM

## 2016-11-28 DIAGNOSIS — R0602 Shortness of breath: Secondary | ICD-10-CM

## 2016-11-28 DIAGNOSIS — J45909 Unspecified asthma, uncomplicated: Secondary | ICD-10-CM | POA: Diagnosis present

## 2016-11-28 DIAGNOSIS — E0580 Other thyrotoxicosis without thyrotoxic crisis or storm: Secondary | ICD-10-CM | POA: Diagnosis present

## 2016-11-28 DIAGNOSIS — F05 Delirium due to known physiological condition: Secondary | ICD-10-CM | POA: Diagnosis not present

## 2016-11-28 DIAGNOSIS — D638 Anemia in other chronic diseases classified elsewhere: Secondary | ICD-10-CM | POA: Diagnosis present

## 2016-11-28 DIAGNOSIS — M797 Fibromyalgia: Secondary | ICD-10-CM | POA: Diagnosis present

## 2016-11-28 DIAGNOSIS — J9601 Acute respiratory failure with hypoxia: Secondary | ICD-10-CM | POA: Diagnosis present

## 2016-11-28 DIAGNOSIS — I4891 Unspecified atrial fibrillation: Secondary | ICD-10-CM | POA: Diagnosis not present

## 2016-11-28 DIAGNOSIS — I481 Persistent atrial fibrillation: Secondary | ICD-10-CM | POA: Diagnosis not present

## 2016-11-28 DIAGNOSIS — G9341 Metabolic encephalopathy: Secondary | ICD-10-CM | POA: Diagnosis present

## 2016-11-28 DIAGNOSIS — Z681 Body mass index (BMI) 19 or less, adult: Secondary | ICD-10-CM

## 2016-11-28 DIAGNOSIS — Z820 Family history of epilepsy and other diseases of the nervous system: Secondary | ICD-10-CM

## 2016-11-28 DIAGNOSIS — R0902 Hypoxemia: Secondary | ICD-10-CM

## 2016-11-28 DIAGNOSIS — I480 Paroxysmal atrial fibrillation: Secondary | ICD-10-CM | POA: Diagnosis present

## 2016-11-28 DIAGNOSIS — E785 Hyperlipidemia, unspecified: Secondary | ICD-10-CM | POA: Diagnosis present

## 2016-11-28 DIAGNOSIS — I429 Cardiomyopathy, unspecified: Secondary | ICD-10-CM | POA: Diagnosis present

## 2016-11-28 DIAGNOSIS — Z8249 Family history of ischemic heart disease and other diseases of the circulatory system: Secondary | ICD-10-CM

## 2016-11-28 DIAGNOSIS — I5043 Acute on chronic combined systolic (congestive) and diastolic (congestive) heart failure: Secondary | ICD-10-CM | POA: Diagnosis present

## 2016-11-28 DIAGNOSIS — I959 Hypotension, unspecified: Secondary | ICD-10-CM | POA: Diagnosis not present

## 2016-11-28 DIAGNOSIS — Z880 Allergy status to penicillin: Secondary | ICD-10-CM

## 2016-11-28 DIAGNOSIS — J90 Pleural effusion, not elsewhere classified: Secondary | ICD-10-CM | POA: Diagnosis not present

## 2016-11-28 DIAGNOSIS — R000 Tachycardia, unspecified: Secondary | ICD-10-CM

## 2016-11-28 DIAGNOSIS — G8929 Other chronic pain: Secondary | ICD-10-CM | POA: Diagnosis present

## 2016-11-28 DIAGNOSIS — I5031 Acute diastolic (congestive) heart failure: Secondary | ICD-10-CM | POA: Diagnosis not present

## 2016-11-28 DIAGNOSIS — I4819 Other persistent atrial fibrillation: Secondary | ICD-10-CM

## 2016-11-28 DIAGNOSIS — Z7901 Long term (current) use of anticoagulants: Secondary | ICD-10-CM

## 2016-11-28 DIAGNOSIS — Y92009 Unspecified place in unspecified non-institutional (private) residence as the place of occurrence of the external cause: Secondary | ICD-10-CM

## 2016-11-28 DIAGNOSIS — K754 Autoimmune hepatitis: Secondary | ICD-10-CM | POA: Diagnosis present

## 2016-11-28 DIAGNOSIS — I509 Heart failure, unspecified: Secondary | ICD-10-CM | POA: Diagnosis not present

## 2016-11-28 DIAGNOSIS — F419 Anxiety disorder, unspecified: Secondary | ICD-10-CM | POA: Diagnosis present

## 2016-11-28 DIAGNOSIS — R636 Underweight: Secondary | ICD-10-CM | POA: Diagnosis present

## 2016-11-28 DIAGNOSIS — R040 Epistaxis: Secondary | ICD-10-CM | POA: Diagnosis not present

## 2016-11-28 DIAGNOSIS — I248 Other forms of acute ischemic heart disease: Secondary | ICD-10-CM | POA: Diagnosis present

## 2016-11-28 DIAGNOSIS — E039 Hypothyroidism, unspecified: Secondary | ICD-10-CM | POA: Diagnosis not present

## 2016-11-28 DIAGNOSIS — I5032 Chronic diastolic (congestive) heart failure: Secondary | ICD-10-CM

## 2016-11-28 DIAGNOSIS — R443 Hallucinations, unspecified: Secondary | ICD-10-CM | POA: Diagnosis not present

## 2016-11-28 DIAGNOSIS — G9340 Encephalopathy, unspecified: Secondary | ICD-10-CM

## 2016-11-28 DIAGNOSIS — Z79899 Other long term (current) drug therapy: Secondary | ICD-10-CM

## 2016-11-28 DIAGNOSIS — E034 Atrophy of thyroid (acquired): Secondary | ICD-10-CM

## 2016-11-28 DIAGNOSIS — R079 Chest pain, unspecified: Secondary | ICD-10-CM

## 2016-11-28 DIAGNOSIS — K219 Gastro-esophageal reflux disease without esophagitis: Secondary | ICD-10-CM | POA: Diagnosis present

## 2016-11-28 DIAGNOSIS — M545 Low back pain: Secondary | ICD-10-CM | POA: Diagnosis present

## 2016-11-28 DIAGNOSIS — C50919 Malignant neoplasm of unspecified site of unspecified female breast: Secondary | ICD-10-CM

## 2016-11-28 DIAGNOSIS — Z853 Personal history of malignant neoplasm of breast: Secondary | ICD-10-CM

## 2016-11-28 DIAGNOSIS — D509 Iron deficiency anemia, unspecified: Secondary | ICD-10-CM | POA: Diagnosis present

## 2016-11-28 DIAGNOSIS — R0609 Other forms of dyspnea: Secondary | ICD-10-CM

## 2016-11-28 HISTORY — DX: Other ill-defined heart diseases: I51.89

## 2016-11-28 HISTORY — DX: Other persistent atrial fibrillation: I48.19

## 2016-11-28 LAB — COMPREHENSIVE METABOLIC PANEL
ALT: 23 U/L (ref 14–54)
ANION GAP: 8 (ref 5–15)
AST: 67 U/L — ABNORMAL HIGH (ref 15–41)
Albumin: 3.5 g/dL (ref 3.5–5.0)
Alkaline Phosphatase: 64 U/L (ref 38–126)
BILIRUBIN TOTAL: 2.2 mg/dL — AB (ref 0.3–1.2)
BUN: 27 mg/dL — ABNORMAL HIGH (ref 6–20)
CO2: 25 mmol/L (ref 22–32)
CREATININE: 1.08 mg/dL — AB (ref 0.44–1.00)
Calcium: 8.8 mg/dL — ABNORMAL LOW (ref 8.9–10.3)
Chloride: 103 mmol/L (ref 101–111)
GFR, EST AFRICAN AMERICAN: 54 mL/min — AB (ref >60)
GFR, EST NON AFRICAN AMERICAN: 46 mL/min — AB (ref >60)
Glucose, Bld: 106 mg/dL — ABNORMAL HIGH (ref 65–99)
Potassium: 3 mmol/L — ABNORMAL LOW (ref 3.5–5.1)
Sodium: 136 mmol/L (ref 135–145)
Total Protein: 7.8 g/dL (ref 6.5–8.1)

## 2016-11-28 LAB — URINALYSIS, ROUTINE W REFLEX MICROSCOPIC
Bilirubin Urine: NEGATIVE
GLUCOSE, UA: NEGATIVE mg/dL
HGB URINE DIPSTICK: NEGATIVE
Ketones, ur: NEGATIVE mg/dL
Nitrite: NEGATIVE
PH: 5 (ref 5.0–8.0)
Protein, ur: 100 mg/dL — AB
SPECIFIC GRAVITY, URINE: 1.024 (ref 1.005–1.030)

## 2016-11-28 LAB — CBC WITH DIFFERENTIAL/PLATELET
Basophils Absolute: 0 10*3/uL (ref 0.0–0.1)
Basophils Relative: 0 %
Eosinophils Absolute: 0 10*3/uL (ref 0.0–0.7)
Eosinophils Relative: 1 %
HEMATOCRIT: 30.2 % — AB (ref 36.0–46.0)
HEMOGLOBIN: 9.4 g/dL — AB (ref 12.0–15.0)
LYMPHS ABS: 0.6 10*3/uL — AB (ref 0.7–4.0)
LYMPHS PCT: 9 %
MCH: 25.2 pg — AB (ref 26.0–34.0)
MCHC: 31.1 g/dL (ref 30.0–36.0)
MCV: 81 fL (ref 78.0–100.0)
MONOS PCT: 13 %
Monocytes Absolute: 0.9 10*3/uL (ref 0.1–1.0)
NEUTROS PCT: 77 %
Neutro Abs: 5.2 10*3/uL (ref 1.7–7.7)
Platelets: 258 10*3/uL (ref 150–400)
RBC: 3.73 MIL/uL — AB (ref 3.87–5.11)
RDW: 17.4 % — ABNORMAL HIGH (ref 11.5–15.5)
WBC: 6.7 10*3/uL (ref 4.0–10.5)

## 2016-11-28 LAB — PROTIME-INR
INR: 1.6
Prothrombin Time: 19.2 seconds — ABNORMAL HIGH (ref 11.4–15.2)

## 2016-11-28 LAB — MRSA PCR SCREENING: MRSA BY PCR: NEGATIVE

## 2016-11-28 LAB — TSH: TSH: 0.492 u[IU]/mL (ref 0.350–4.500)

## 2016-11-28 LAB — BRAIN NATRIURETIC PEPTIDE: B NATRIURETIC PEPTIDE 5: 982.8 pg/mL — AB (ref 0.0–100.0)

## 2016-11-28 LAB — TROPONIN I: TROPONIN I: 0.03 ng/mL — AB (ref <0.03)

## 2016-11-28 LAB — MAGNESIUM: Magnesium: 2.1 mg/dL (ref 1.7–2.4)

## 2016-11-28 LAB — APTT: aPTT: 37 seconds — ABNORMAL HIGH (ref 24–36)

## 2016-11-28 MED ORDER — FUROSEMIDE 10 MG/ML IJ SOLN
40.0000 mg | Freq: Every day | INTRAMUSCULAR | Status: DC
  Administered 2016-11-29: 40 mg via INTRAVENOUS
  Filled 2016-11-28: qty 4

## 2016-11-28 MED ORDER — PROCHLORPERAZINE EDISYLATE 5 MG/ML IJ SOLN
5.0000 mg | Freq: Four times a day (QID) | INTRAMUSCULAR | Status: DC | PRN
  Administered 2016-11-28: 5 mg via INTRAVENOUS
  Filled 2016-11-28: qty 2

## 2016-11-28 MED ORDER — ACETAMINOPHEN 325 MG PO TABS: 650.0000 mg | ORAL_TABLET | Freq: Four times a day (QID) | ORAL | Status: DC | PRN

## 2016-11-28 MED ORDER — DILTIAZEM HCL ER COATED BEADS 120 MG PO CP24
120.0000 mg | ORAL_CAPSULE | Freq: Every day | ORAL | Status: DC
  Administered 2016-11-28: 120 mg via ORAL
  Filled 2016-11-28: qty 1

## 2016-11-28 MED ORDER — LEVOTHYROXINE SODIUM 100 MCG PO TABS
100.0000 ug | ORAL_TABLET | Freq: Every day | ORAL | Status: DC
  Administered 2016-11-29 – 2016-11-30 (×2): 100 ug via ORAL
  Filled 2016-11-28 (×2): qty 1

## 2016-11-28 MED ORDER — METOPROLOL TARTRATE 5 MG/5ML IV SOLN
2.5000 mg | INTRAVENOUS | Status: DC | PRN
  Administered 2016-11-28: 2.5 mg via INTRAVENOUS
  Filled 2016-11-28: qty 5

## 2016-11-28 MED ORDER — DILTIAZEM HCL-DEXTROSE 100-5 MG/100ML-% IV SOLN (PREMIX)
5.0000 mg/h | INTRAVENOUS | Status: DC
  Administered 2016-11-28: 5 mg/h via INTRAVENOUS
  Filled 2016-11-28: qty 100

## 2016-11-28 MED ORDER — ENSURE ENLIVE PO LIQD: 237.0000 mL | Freq: Two times a day (BID) | ORAL | Status: DC

## 2016-11-28 MED ORDER — DILTIAZEM HCL-DEXTROSE 100-5 MG/100ML-% IV SOLN (PREMIX): 5.0000 mg/h | INTRAVENOUS | Status: DC

## 2016-11-28 MED ORDER — METOPROLOL TARTRATE 5 MG/5ML IV SOLN
5.0000 mg | Freq: Once | INTRAVENOUS | Status: AC
  Administered 2016-11-28: 5 mg via INTRAVENOUS
  Filled 2016-11-28: qty 5

## 2016-11-28 MED ORDER — POTASSIUM CHLORIDE CRYS ER 20 MEQ PO TBCR
40.0000 meq | EXTENDED_RELEASE_TABLET | Freq: Two times a day (BID) | ORAL | Status: DC
  Administered 2016-11-28: 40 meq via ORAL
  Filled 2016-11-28: qty 2

## 2016-11-28 MED ORDER — AZATHIOPRINE 50 MG PO TABS
50.0000 mg | ORAL_TABLET | Freq: Every day | ORAL | Status: DC
  Administered 2016-11-28 – 2016-12-04 (×7): 50 mg via ORAL
  Filled 2016-11-28 (×8): qty 1

## 2016-11-28 MED ORDER — SODIUM CHLORIDE 0.9% FLUSH
3.0000 mL | Freq: Two times a day (BID) | INTRAVENOUS | Status: DC
  Administered 2016-11-28 – 2016-12-04 (×11): 3 mL via INTRAVENOUS

## 2016-11-28 MED ORDER — DILTIAZEM LOAD VIA INFUSION
15.0000 mg | Freq: Once | INTRAVENOUS | Status: AC
  Administered 2016-11-28: 15 mg via INTRAVENOUS
  Filled 2016-11-28: qty 15

## 2016-11-28 MED ORDER — METOPROLOL TARTRATE 25 MG PO TABS
25.0000 mg | ORAL_TABLET | Freq: Two times a day (BID) | ORAL | Status: DC
  Administered 2016-11-29 (×2): 25 mg via ORAL
  Filled 2016-11-28 (×2): qty 1

## 2016-11-28 MED ORDER — ACETAMINOPHEN 650 MG RE SUPP: 650.0000 mg | Freq: Four times a day (QID) | RECTAL | Status: DC | PRN

## 2016-11-28 MED ORDER — DILTIAZEM LOAD VIA INFUSION
10.0000 mg | Freq: Once | INTRAVENOUS | Status: DC
  Filled 2016-11-28: qty 10

## 2016-11-28 MED ORDER — FUROSEMIDE 10 MG/ML IJ SOLN
40.0000 mg | Freq: Once | INTRAMUSCULAR | Status: AC
  Administered 2016-11-28: 40 mg via INTRAVENOUS
  Filled 2016-11-28: qty 4

## 2016-11-28 MED ORDER — RIVAROXABAN 15 MG PO TABS: 15.0000 mg | ORAL_TABLET | Freq: Every day | ORAL | Status: DC

## 2016-11-28 NOTE — Progress Notes (Signed)
Dr. [place] Pink stated he made admitting aware of pt's arrival.

## 2016-11-28 NOTE — Progress Notes (Signed)
Pre-visit discussion using our clinic review tool. No additional management support is needed unless otherwise documented below in the visit note.  

## 2016-11-28 NOTE — Assessment & Plan Note (Signed)
recurrent nose bleeds

## 2016-11-28 NOTE — Progress Notes (Signed)
Paged admitting about pt's arrival.

## 2016-11-28 NOTE — Progress Notes (Signed)
Subjective:  Patient ID: Michele Diaz, female    DOB: Apr 06, 1932  Age: 83 y.o. MRN: [id number]  CC: Follow-up (A fib, CVA, HTN, confusion, falling, edema, vertigo)   HPI Michele Diaz presents for asthma, anxiety, HTN f/u C/o FTT x 1 mo C/o SOB and LE edema, weakness, confusion  Outpatient Medications Prior to Visit  Medication Sig Dispense Refill  . albuterol (PROVENTIL HFA;VENTOLIN HFA) 108 (90 Base) MCG/ACT inhaler Inhale 1-2 puffs into the lungs every 6 (six) hours as needed for wheezing or shortness of breath. 18 g 11  . ALPRAZolam (XANAX) 0.5 MG tablet TAKE A 1/2 TABLET TWICE A DAY BY MOUTH AS NEEDED 60 tablet 0  . azaTHIOprine (IMURAN) 50 MG tablet Take 1 tablet (50 mg total) by mouth daily. 90 tablet 3  . cholecalciferol (VITAMIN D) 1000 UNITS tablet Take 1 tablet (1,000 Units total) by mouth daily. 30 tablet 1  . diltiazem (CARDIZEM CD) 120 MG 24 hr capsule Take 2 capsules (240 mg total) by mouth daily. 60 capsule 11  . escitalopram (LEXAPRO) 10 MG tablet TAKE 1 TABLET BY MOUTH DAILY 30 tablet 5  . escitalopram (LEXAPRO) 10 MG tablet TAKE 1 TABLET BY MOUTH DAILY 90 tablet 1  . furosemide (LASIX) 20 MG tablet Take 1 tablet (20 mg total) by mouth daily as needed for edema. 30 tablet 0  . levothyroxine (SYNTHROID, LEVOTHROID) 100 MCG tablet Take 1 tablet (100 mcg total) by mouth daily before breakfast. 30 tablet 6  . levothyroxine (SYNTHROID, LEVOTHROID) 75 MCG tablet TAKE 1 TABLET (75 MCG TOTAL) BY MOUTH DAILY BEFORE BREAKFAST. 90 tablet 2  . losartan (COZAAR) 50 MG tablet TAKE 1 TABLET BY MOUTH ONCE DAILY 30 tablet 11  . meclizine (ANTIVERT) 12.5 MG tablet Take 1 tablet (12.5 mg total) by mouth 2 (two) times daily as needed for dizziness. 180 tablet 1  . metoprolol tartrate (LOPRESSOR) 25 MG tablet Take 1 tablet (25 mg total) by mouth 2 (two) times daily. 60 tablet 11  . pantoprazole (PROTONIX) 40 MG tablet Take 1 tablet (40 mg total) by mouth daily. 90 tablet 2  . potassium  chloride (K-DUR) 10 MEQ tablet Take 1 tablet (10 mEq total) by mouth daily as needed. 30 tablet 0  . Rivaroxaban (XARELTO) 15 MG TABS tablet Take 1 tablet (15 mg total) by mouth daily. 30 tablet 11  . triamcinolone cream (KENALOG) 0.1 % Apply 1 application topically 4 (four) times daily. Use on rash 45 g 3   No facility-administered medications prior to visit.    Past Medical History:  Diagnosis Date  . Allergic rhinitis   . Anxiety   . Arthritis    "hips, hands, feet; pretty much feels like all over" (01/07/2013)  . Asthma   . Breast cancer ([place]) ~ 2008   "right" (01/07/2013)  . Chronic lower back pain   . Daily headache    "related to temporal arteritis" (01/07/2013)  . Depression   . Diverticulitis of colon   . Fibromyalgia   . GERD (gastroesophageal reflux disease)   . Giant cell arteritis ([place]) 2003   devashwar  . Hepatitis, autoimmune ([place])   . Herpes zoster 2011  . Hyperlipidemia    statins increased LFTs  . Hypertension   . Hypothyroid   . PAF (paroxysmal atrial fibrillation) ([place])    a. s/p TEE/DCCV in 01/2016  . PONV (postoperative nausea and vomiting)    "years ago" (01/07/2013)  . Stroke ([place])   . Temporal arteritis (  [place])   . Unspecified vitamin D deficiency   . Weight loss 6/11   Past Surgical History:  Procedure Laterality Date  . ABDOMINAL HYSTERECTOMY  1988  . APPENDECTOMY    . BREAST BIOPSY Right   . BREAST LUMPECTOMY Right   . CARDIOVERSION N/A 02/06/2016   Procedure: CARDIOVERSION;  Surgeon: Skeet Latch, MD;  Location: [facility];  Service: Cardiovascular;  Laterality: N/A;  . CHOLECYSTECTOMY    . DILATION AND CURETTAGE OF UTERUS    . TEE WITHOUT CARDIOVERSION N/A 02/06/2016   Procedure: TRANSESOPHAGEAL ECHOCARDIOGRAM (TEE);  Surgeon: Skeet Latch, MD;  Location: [facility] ENDOSCOPY;  Service: Cardiovascular;  Laterality: N/A;  . TONSILLECTOMY      reports that she has never smoked. She has never used smokeless tobacco. She reports that she does  not drink alcohol or use drugs. family history includes Alzheimer's disease in her sister; Cancer in her brother and sister; Diabetes in her mother; Heart attack in her mother; Heart disease in her brother and sister; Hypertension in her other. Allergies  Allergen Reactions  . Celecoxib Shortness Of Breath    redness  . Codeine Nausea Only  . Atorvastatin     Elevated liver enzymes  . Rosuvastatin     Elevated liver enzymes  . Penicillins Hives     ROS Review of Systems  Constitutional: Positive for fatigue. Negative for activity change, appetite change, chills and unexpected weight change.  HENT: Negative for congestion, mouth sores and sinus pressure.   Eyes: Negative for visual disturbance.  Respiratory: Positive for shortness of breath. Negative for cough and chest tightness.   Gastrointestinal: Negative for abdominal pain and nausea.  Genitourinary: Negative for difficulty urinating, frequency and vaginal pain.  Musculoskeletal: Positive for gait problem. Negative for back pain.  Skin: Negative for pallor and rash.  Neurological: Positive for dizziness, weakness and light-headedness. Negative for tremors, numbness and headaches.  Psychiatric/Behavioral: Positive for behavioral problems, confusion and decreased concentration. Negative for sleep disturbance.    Objective:  There were no vitals taken for this visit.  BP Readings from Last 3 Encounters:  08/27/16 114/80  08/18/16 120/72  08/04/16 119/84    Wt Readings from Last 3 Encounters:  08/27/16 110 lb (49.9 kg)  08/18/16 111 lb (50.3 kg)  08/04/16 112 lb (50.8 kg)    Physical Exam  Constitutional: She appears well-developed. No distress.  HENT:  Head: Normocephalic.  Right Ear: External ear normal.  Left Ear: External ear normal.  Nose: Nose normal.  Mouth/Throat: Oropharynx is clear and moist.  Eyes: Conjunctivae are normal. Pupils are equal, round, and reactive to light. Right eye exhibits no discharge.  Left eye exhibits no discharge.  Neck: Normal range of motion. Neck supple. No JVD present. No tracheal deviation present. No thyromegaly present.  Cardiovascular:  Murmur heard. Pulmonary/Chest: No stridor. No respiratory distress. She has no wheezes.  Abdominal: Soft. Bowel sounds are normal. She exhibits no distension and no mass. There is no tenderness. There is no rebound and no guarding.  Musculoskeletal: She exhibits no edema or tenderness.  Lymphadenopathy:    She has no cervical adenopathy.  Neurological: She displays normal reflexes. No cranial nerve deficit. She exhibits normal muscle tone. Coordination abnormal.  Skin: No rash noted. No erythema.  Psychiatric: She has a normal mood and affect. Her behavior is normal. Judgment and thought content normal.  irreg irreg RR Heart murmur Pale Ataxic Edema 1+  Lab Results  Component Value Date   WBC 8.4 06/30/2016  HGB 10.0 (L) 06/30/2016   HCT 28.6 (L) 06/30/2016   PLT 188.0 06/30/2016   GLUCOSE 112 (H) 05/27/2016   CHOL 178 01/08/2013   TRIG 226 (H) 01/08/2013   HDL 58 01/08/2013   LDLDIRECT 125.9 10/06/2007   LDLCALC 75 01/08/2013   ALT 26 02/05/2016   AST 43 (H) 02/05/2016   NA 132 (L) 05/27/2016   K 4.6 05/27/2016   CL 100 05/27/2016   CREATININE 0.95 05/27/2016   BUN 12 05/27/2016   CO2 29 05/27/2016   TSH 5.45 (H) 08/01/2016   INR 1.2 (H) 08/22/2013   HGBA1C 5.3 01/08/2013    Dg Chest 2 View  Result Date: 08/01/2016 CLINICAL DATA:  Shortness of breath with exertion for 2 weeks. Right flank pain. EXAM: CHEST  2 VIEW COMPARISON:  02/07/2016 FINDINGS: There are small bilateral pleural effusions. Bibasilar atelectasis. Mild cardiomegaly. No acute bony abnormality. IMPRESSION: Cardiomegaly.  Bibasilar atelectasis with small effusions. Electronically Signed   By: Rolm Baptise M.D.   On: 08/01/2016 14:48    Assessment & Plan:   There are no diagnoses linked to this encounter. I am having [name] maintain  her cholecalciferol, azaTHIOprine, meclizine, triamcinolone cream, losartan, pantoprazole, metoprolol tartrate, diltiazem, levothyroxine, escitalopram, furosemide, potassium chloride, Rivaroxaban, ALPRAZolam, levothyroxine, albuterol, and escitalopram.  No orders of the defined types were placed in this encounter.    Follow-up: No Follow-up on file.  Walker Kehr, MD

## 2016-11-28 NOTE — Assessment & Plan Note (Signed)
Subacute delirium Adm to [facility]

## 2016-11-28 NOTE — H&P (Signed)
History and Physical  Patient Name: Michele Diaz     XKG:[id number]    DOB: May 03, 1932    DOA: 11/28/2016 PCP: Walker Kehr, MD   Patient coming from: Home  Chief Complaint: Confusion  HPI: Michele Diaz is a 83 y.o. female with a past medical history significant for HFpEF, HTN, hypothyroidism, pAF on Xarelto, and autoimmune hepatitis on Imuran who presents with subacute confusion.  Caveat that all history collected from daughter in law, as patient is pleasantly confused.  The patient has no discernible dementia at baseline, lives alone in a condo, is independent with ADLs and manages her own bills and medicines and still drives.  The patient initiates her history about 4-5 weeks ago, when she fell out of bed ("it is a high bed") hit her right head on something, and "bout took my ear off".  She is somewhat vague about any other symptoms and about her overall course since then, but does endorse SOB.  Per family, they began to notice she was not acting normally several weeks ago.  She seemed to be hallucinating (said a sister of hers came over to spend the night when she didn't, prepared some food on the counter for "the new baby" but family didn't understand what she was talking about).  Family also found that she had bottles of medicines that were untouched at home when her refills arrived this month.  Now in the last week or so, she has appeared more SOB and has had leg swellnig, and so there was the assumption that she had new dementia (she had a sister with dementia) and so they brought her to her PCP's office for this constellation of findings.     PCP note today is brief but seems to note SOB, LE edema, weakness, confusion, ? UTI and ? Afib with RVR again, and that she should be evaluated in the hospital.  Family alert me in the hall that she has complained of back pain recently, and worries because the patient's husband died of metastatic cancer which presented as back pain when it  returned.         ROS: Review of Systems  Constitutional: Negative for chills and fever.  Respiratory: Positive for shortness of breath.   Cardiovascular: Positive for leg swelling. Negative for chest pain, orthopnea and PND.  Gastrointestinal: Negative for abdominal pain, nausea and vomiting.  Genitourinary: Negative for dysuria, frequency, hematuria and urgency.  Musculoskeletal: Positive for back pain (family note this).  Neurological: Positive for dizziness and weakness.  Psychiatric/Behavioral: Positive for memory loss (or confusion).  All other systems reviewed and are negative.         Past Medical History:  Diagnosis Date  . Allergic rhinitis   . Anxiety   . Arthritis    "hips, hands, feet; pretty much feels like all over" (01/07/2013)  . Asthma   . Breast cancer ([place]) ~ 2008   "right" (01/07/2013)  . Chronic lower back pain   . Daily headache    "related to temporal arteritis" (01/07/2013)  . Depression   . Diverticulitis of colon   . Fibromyalgia   . GERD (gastroesophageal reflux disease)   . Giant cell arteritis ([place]) 2003   devashwar  . Hepatitis, autoimmune ([place])   . Herpes zoster 2011  . Hyperlipidemia    statins increased LFTs  . Hypertension   . Hypothyroid   . PAF (paroxysmal atrial fibrillation) ([place])    a. s/p TEE/DCCV in 01/2016  . PONV (  postoperative nausea and vomiting)    "years ago" (01/07/2013)  . Stroke ([place])   . Temporal arteritis ([place])   . Unspecified vitamin D deficiency   . Weight loss 6/11    Past Surgical History:  Procedure Laterality Date  . ABDOMINAL HYSTERECTOMY  1988  . APPENDECTOMY    . BREAST BIOPSY Right   . BREAST LUMPECTOMY Right   . CARDIOVERSION N/A 02/06/2016   Procedure: CARDIOVERSION;  Surgeon: Skeet Latch, MD;  Location: [facility];  Service: Cardiovascular;  Laterality: N/A;  . CHOLECYSTECTOMY    . DILATION AND CURETTAGE OF UTERUS    . TEE WITHOUT CARDIOVERSION N/A 02/06/2016   Procedure:  TRANSESOPHAGEAL ECHOCARDIOGRAM (TEE);  Surgeon: Skeet Latch, MD;  Location: [facility] ENDOSCOPY;  Service: Cardiovascular;  Laterality: N/A;  . TONSILLECTOMY      Social History: Patient lives alone.  The patient walks unassisted.  Still drives.  Independent with ADLs.  Not a smoker.    Allergies  Allergen Reactions  . Atorvastatin Other (See Comments)    Elevated liver enzymes  . Celecoxib Shortness Of Breath, Itching and Rash    redness  . Rosuvastatin Other (See Comments)    Elevated liver enzymes  . Codeine Nausea Only  . Penicillins Hives    Family history: family history includes Alzheimer's disease in her sister; Cancer in her brother and sister; Diabetes in her mother; Heart attack in her mother; Heart disease in her brother and sister; Hypertension in her other.  Prior to Admission medications   Medication Sig Start Date End Date Taking? Authorizing Provider  albuterol (PROVENTIL HFA;VENTOLIN HFA) 108 (90 Base) MCG/ACT inhaler Inhale 1-2 puffs into the lungs every 6 (six) hours as needed for wheezing or shortness of breath. 11/05/16  Yes Aleksei [name] V, MD  ALPRAZolam (XANAX) 0.5 MG tablet TAKE A 1/2 TABLET TWICE A DAY BY MOUTH AS NEEDED Patient taking differently: TAKE 1/2 TABLET TWICE A DAY BY MOUTH AS NEEDED FOR ANXIETY 09/10/16  Yes Biagio Borg, MD  azaTHIOprine (IMURAN) 50 MG tablet Take 1 tablet (50 mg total) by mouth daily. 09/28/15  Yes Aleksei [name] V, MD  cholecalciferol (VITAMIN D) 1000 UNITS tablet Take 1 tablet (1,000 Units total) by mouth daily. 01/14/13  Yes Daniel J Angiulli, PA-C  diltiazem (CARDIZEM CD) 120 MG 24 hr capsule Take 2 capsules (240 mg total) by mouth daily. Patient taking differently: Take 120 mg by mouth daily.  08/01/16  Yes Charlene Brooke Nche, NP  escitalopram (LEXAPRO) 10 MG tablet TAKE 1 TABLET BY MOUTH DAILY 08/05/16  Yes Aleksei [name] V, MD  furosemide (LASIX) 20 MG tablet Take 1 tablet (20 mg total) by mouth daily as needed for  edema. 08/18/16  Yes Erma Heritage, PA  levothyroxine (SYNTHROID, LEVOTHROID) 100 MCG tablet Take 1 tablet (100 mcg total) by mouth daily before breakfast. 08/01/16  Yes Charlene Brooke Nche, NP  losartan (COZAAR) 50 MG tablet TAKE 1 TABLET BY MOUTH ONCE DAILY 03/11/16  Yes Aleksei [name] V, MD  meclizine (ANTIVERT) 12.5 MG tablet Take 1 tablet (12.5 mg total) by mouth 2 (two) times daily as needed for dizziness. 02/08/16  Yes Cherene Altes, MD  metoprolol tartrate (LOPRESSOR) 25 MG tablet Take 1 tablet (25 mg total) by mouth 2 (two) times daily. 08/01/16  Yes Charlene Brooke Nche, NP  pantoprazole (PROTONIX) 40 MG tablet Take 1 tablet (40 mg total) by mouth daily. 04/07/16  Yes Aleksei [name] V, MD  potassium chloride (K-DUR) 10 MEQ tablet Take  1 tablet (10 mEq total) by mouth daily as needed. Patient taking differently: Take 10 mEq by mouth daily as needed (TAKES WITH LASIX ONLY).  08/18/16  Yes Erma Heritage, PA  Rivaroxaban (XARELTO) 15 MG TABS tablet Take 1 tablet (15 mg total) by mouth daily. Patient taking differently: Take 15 mg by mouth daily with supper.  08/27/16  Yes Cassandria Anger, MD       Physical Exam: BP (!) 131/104   Pulse (!) 113   Temp 97.6 F (36.4 C) (Oral)   Resp 16   Ht _0  (1.575 m)   Wt 49.2 kg (108 lb 6.4 oz)   SpO2 94%   BMI 19.83 kg/m  General appearance: Thin elderly female, alert and in no obvious distress, appears confused, slowed.   Head: [place]/AT. No bruising or scar behind right ear. Eyes: Anicteric, conjunctiva pink, lids and lashes normal. PERRL.    ENT: No nasal deformity, discharge, epistaxis.  Hearing normal. OP dry without lesions.   Neck: No neck masses.  Trachea midline.  No thyromegaly/tenderness. Lymph: No cervical or supraclavicular lymphadenopathy. Skin: Warm and dry.  No jaundice.  No suspicious rashes or lesions. Cardiac: Tachycardic, irregular, nl S1-S2, no murmurs appreciated by me.  Capillary refill is brisk.  No JVD.   Marked LE edema.  Radial pulses 2+ and symmetric. Respiratory: Normal respiratory rate and rhythm.  No wheezes.  Atelectasis vs rales at very bases. Abdomen: Abdomen soft.  No TTP. No ascites, distension, hepatosplenomegaly.   MSK: No deformities or effusions.  No cyanosis or clubbing. Neuro: Pupils are 3 mm and reactive to 2 mm.  Extraocular movements are intact, without nystagmus.  Cranial nerve 5 is within normal limits.  Cranial nerve 7 is symmetrical.  Cranial nerve 8 is within normal limits.  Cranial nerves 9 and 10 reveal equal palate elevation.  Cranial nerve 11 reveals sternocleidomastoid strong.  Cranial nerve 12 is midline. Motor strength testing is 4+/5 in the upper and lower extremities bilaterally, symmetric, globally weak with normal motor, tone and bulk for age, somewhat diminished overall. No clonus. Sensory examination is intact to light touch.  No pronator drift.  Finger-to-nose testing is within normal limits. The patient is not oriented to situation, calls her daughter in law her sister at first.  Slow to process most questions.  Speech is fluent.  Naming is slowed.  Recall, recent and remote, as well as general fund of knowledge seem markedly impaired.  Attention span and concentration are within normal limits. Psych: Sensorium intact and responding to questions, attention seems normal.       Labs on Admission:  I have personally reviewed following labs and imaging studies: CBC: No results for input(s): WBC, NEUTROABS, HGB, HCT, MCV, PLT in the last 168 hours. Basic Metabolic Panel: No results for input(s): NA, K, CL, CO2, GLUCOSE, BUN, CREATININE, CALCIUM, MG, PHOS in the last 168 hours. GFR: CrCl cannot be calculated (Patient's most recent lab result is older than the maximum 21 days allowed.).  Liver Function Tests: No results for input(s): AST, ALT, ALKPHOS, BILITOT, PROT, ALBUMIN in the last 168 hours. No results for input(s): LIPASE, AMYLASE in the last 168 hours. No  results for input(s): AMMONIA in the last 168 hours. Coagulation Profile: No results for input(s): INR, PROTIME in the last 168 hours. Cardiac Enzymes: No results for input(s): CKTOTAL, CKMB, CKMBINDEX, TROPONINI in the last 168 hours. BNP (last 3 results) No results for input(s): PROBNP in the last 8760 hours.  HbA1C: No results for input(s): HGBA1C in the last 72 hours. CBG: No results for input(s): GLUCAP in the last 168 hours. Lipid Profile: No results for input(s): CHOL, HDL, LDLCALC, TRIG, CHOLHDL, LDLDIRECT in the last 72 hours. Thyroid Function Tests: No results for input(s): TSH, T4TOTAL, FREET4, T3FREE, THYROIDAB in the last 72 hours. Anemia Panel: No results for input(s): VITAMINB12, FOLATE, FERRITIN, TIBC, IRON, RETICCTPCT in the last 72 hours. Sepsis Labs:  Invalid input(s): PROCALCITONIN, LACTICIDVEN No results found for this or any previous visit (from the past 240 hour(s)).       Radiological Exams on Admission: Personally reviewed: No results found.  EKG: Independently reviewed. Rate 138, Afib.    Assessment/Plan  1. Confusion:  Diagnostic considerations include mild delirium from CHF flare/Afib RVR in setting of baseline mild cognitive impairment (favored) vs infection, SDH, cancer, hyponatremia, MI, stroke.  Will check CBC, UA and CXR to rule out infection, but no fevers/focal sxs of infection, so this is doubted.  Given her head trauma, subacute SDH is obvious concern.  Given her previous BrCa and back pain (per family), metastasis to brain is also a consideration -Obtain CMP and complete blood counts -Check coags -Obtain CT head without contrast -Check UA -Check CXR -Check TSH -If CT head negative, will pursue MR brain and Neuro consult   2. Acute on chronic diastolic CHF:  EF 32-44% without significant valvular disease. -Check BNP -Check renal function -IV diuresis pending above -Rate control as below  3. Atrial fibrillation with RVR:   CHADS2Vasc 4.  On Xarelto and BB and diltiazem.  Given recent confusion, high likelihood of medication non-compliance.  Given that she was direct admitted to a telemetry bed, will first attempt IV push metoprolol to rate control overnight.   -If rate controlled with pushes overnight, continue oral diltiazem and metoprolol tomorrow -If not, will d/w cardiology re: dilt vs amio in setting of mild CHF flare -Check TSH -Check BMP -Check troponin -Check Mag -Hold anticoag until CT head, then restart Xarelto if no bleeding  4. Hyponatremia:  Chronic, baseline 130-132. -Check BMP  5. Hypothyroidism:  Last TSH mostly normal. -Continue levothyroxine  6. Chronic anemia:  Iron deficiency.  No longer on iron? -Check CBC  7. Autoimmune hepatitis:  -Continue azathioprine -Check CMP     DVT prophylaxis: None until CT head results  Code Status: FULL  Family Communication: Daughter in Sports coach and son at bedside, CODE STATUS confirmed.  Disposition Plan: Anticipate work up otnight, rate control and then further evaluation tomorrow. Consults called: None overnight Admission status: OBS At the point of initial evaluation, it is my clinical opinion that admission for OBSERVATION is reasonable and necessary because the patient's presenting complaints in the context of their chronic conditions represent sufficient risk of deterioration or significant morbidity to constitute reasonable grounds for close observation in the hospital setting, but that the patient may be medically stable for discharge from the hospital within 24 to 48 hours.    Medical decision making: Patient seen at 7:15 PM on 11/28/2016.  What exists of the patient's chart was reviewed in depth and summarized above.  Clinical condition: stable tachycardia, improving with IV pushes of metoprolol, BP has remained stable and breathing normal, and mentation pleasantly confused.        Edwin Dada [facility] Pager  [phone]

## 2016-11-28 NOTE — Assessment & Plan Note (Signed)
Worse FTT [facility]

## 2016-11-28 NOTE — Addendum Note (Signed)
Addended by: Annita Brod on: 11/28/2016 02:40 PM   Modules accepted: Orders

## 2016-11-28 NOTE — Assessment & Plan Note (Addendum)
A fib w/RVR, ?anemia R/o UTI Will admit to [facility]

## 2016-11-28 NOTE — Progress Notes (Addendum)
Pt resting in bed. HR sustaining in the 120's  And will go up into the 150's. Pt resting in bed and denies any Sob or pain. Pt denies feeling dizzy. Paged [facility] admissions again to see if Md could come see pt.

## 2016-11-29 ENCOUNTER — Encounter (HOSPITAL_COMMUNITY): Payer: Self-pay | Admitting: Internal Medicine

## 2016-11-29 ENCOUNTER — Inpatient Hospital Stay (HOSPITAL_COMMUNITY): Payer: Medicare Other

## 2016-11-29 DIAGNOSIS — I5031 Acute diastolic (congestive) heart failure: Secondary | ICD-10-CM

## 2016-11-29 DIAGNOSIS — G9341 Metabolic encephalopathy: Secondary | ICD-10-CM | POA: Diagnosis present

## 2016-11-29 DIAGNOSIS — I639 Cerebral infarction, unspecified: Secondary | ICD-10-CM | POA: Diagnosis not present

## 2016-11-29 DIAGNOSIS — M545 Low back pain: Secondary | ICD-10-CM | POA: Diagnosis present

## 2016-11-29 DIAGNOSIS — I248 Other forms of acute ischemic heart disease: Secondary | ICD-10-CM | POA: Diagnosis present

## 2016-11-29 DIAGNOSIS — G9340 Encephalopathy, unspecified: Secondary | ICD-10-CM | POA: Diagnosis not present

## 2016-11-29 DIAGNOSIS — R443 Hallucinations, unspecified: Secondary | ICD-10-CM | POA: Diagnosis present

## 2016-11-29 DIAGNOSIS — M6281 Muscle weakness (generalized): Secondary | ICD-10-CM | POA: Diagnosis present

## 2016-11-29 DIAGNOSIS — I480 Paroxysmal atrial fibrillation: Secondary | ICD-10-CM | POA: Diagnosis present

## 2016-11-29 DIAGNOSIS — E871 Hypo-osmolality and hyponatremia: Secondary | ICD-10-CM | POA: Diagnosis present

## 2016-11-29 DIAGNOSIS — R7989 Other specified abnormal findings of blood chemistry: Secondary | ICD-10-CM | POA: Diagnosis not present

## 2016-11-29 DIAGNOSIS — R0602 Shortness of breath: Secondary | ICD-10-CM | POA: Diagnosis not present

## 2016-11-29 DIAGNOSIS — K754 Autoimmune hepatitis: Secondary | ICD-10-CM | POA: Diagnosis present

## 2016-11-29 DIAGNOSIS — I5033 Acute on chronic diastolic (congestive) heart failure: Secondary | ICD-10-CM | POA: Diagnosis present

## 2016-11-29 DIAGNOSIS — Z681 Body mass index (BMI) 19 or less, adult: Secondary | ICD-10-CM | POA: Diagnosis not present

## 2016-11-29 DIAGNOSIS — I429 Cardiomyopathy, unspecified: Secondary | ICD-10-CM | POA: Diagnosis present

## 2016-11-29 DIAGNOSIS — R410 Disorientation, unspecified: Secondary | ICD-10-CM | POA: Diagnosis not present

## 2016-11-29 DIAGNOSIS — I4891 Unspecified atrial fibrillation: Secondary | ICD-10-CM | POA: Diagnosis not present

## 2016-11-29 DIAGNOSIS — R079 Chest pain, unspecified: Secondary | ICD-10-CM | POA: Diagnosis not present

## 2016-11-29 DIAGNOSIS — R748 Abnormal levels of other serum enzymes: Secondary | ICD-10-CM | POA: Diagnosis not present

## 2016-11-29 DIAGNOSIS — Z8249 Family history of ischemic heart disease and other diseases of the circulatory system: Secondary | ICD-10-CM | POA: Diagnosis not present

## 2016-11-29 DIAGNOSIS — K219 Gastro-esophageal reflux disease without esophagitis: Secondary | ICD-10-CM | POA: Diagnosis present

## 2016-11-29 DIAGNOSIS — F329 Major depressive disorder, single episode, unspecified: Secondary | ICD-10-CM | POA: Diagnosis present

## 2016-11-29 DIAGNOSIS — I481 Persistent atrial fibrillation: Secondary | ICD-10-CM | POA: Diagnosis present

## 2016-11-29 DIAGNOSIS — J90 Pleural effusion, not elsewhere classified: Secondary | ICD-10-CM | POA: Diagnosis not present

## 2016-11-29 DIAGNOSIS — I110 Hypertensive heart disease with heart failure: Secondary | ICD-10-CM | POA: Diagnosis present

## 2016-11-29 DIAGNOSIS — Z853 Personal history of malignant neoplasm of breast: Secondary | ICD-10-CM | POA: Diagnosis not present

## 2016-11-29 DIAGNOSIS — G8929 Other chronic pain: Secondary | ICD-10-CM | POA: Diagnosis present

## 2016-11-29 DIAGNOSIS — J9601 Acute respiratory failure with hypoxia: Secondary | ICD-10-CM | POA: Diagnosis present

## 2016-11-29 DIAGNOSIS — D638 Anemia in other chronic diseases classified elsewhere: Secondary | ICD-10-CM | POA: Diagnosis present

## 2016-11-29 DIAGNOSIS — F419 Anxiety disorder, unspecified: Secondary | ICD-10-CM | POA: Diagnosis present

## 2016-11-29 DIAGNOSIS — M797 Fibromyalgia: Secondary | ICD-10-CM | POA: Diagnosis present

## 2016-11-29 DIAGNOSIS — I959 Hypotension, unspecified: Secondary | ICD-10-CM | POA: Diagnosis not present

## 2016-11-29 DIAGNOSIS — E785 Hyperlipidemia, unspecified: Secondary | ICD-10-CM | POA: Diagnosis present

## 2016-11-29 DIAGNOSIS — F05 Delirium due to known physiological condition: Secondary | ICD-10-CM | POA: Diagnosis not present

## 2016-11-29 DIAGNOSIS — I10 Essential (primary) hypertension: Secondary | ICD-10-CM | POA: Diagnosis present

## 2016-11-29 DIAGNOSIS — I5043 Acute on chronic combined systolic (congestive) and diastolic (congestive) heart failure: Secondary | ICD-10-CM | POA: Diagnosis present

## 2016-11-29 DIAGNOSIS — Y92009 Unspecified place in unspecified non-institutional (private) residence as the place of occurrence of the external cause: Secondary | ICD-10-CM | POA: Diagnosis not present

## 2016-11-29 DIAGNOSIS — J45909 Unspecified asthma, uncomplicated: Secondary | ICD-10-CM | POA: Diagnosis present

## 2016-11-29 DIAGNOSIS — N390 Urinary tract infection, site not specified: Secondary | ICD-10-CM | POA: Diagnosis present

## 2016-11-29 LAB — TROPONIN I
TROPONIN I: 1.42 ng/mL — AB (ref <0.03)
TROPONIN I: 0.04 ng/mL — AB (ref <0.03)
TROPONIN I: 0.03 ng/mL — AB (ref <0.03)

## 2016-11-29 LAB — BASIC METABOLIC PANEL
Anion gap: 9 (ref 5–15)
BUN: 29 mg/dL — AB (ref 6–20)
CALCIUM: 8.8 mg/dL — AB (ref 8.9–10.3)
CO2: 26 mmol/L (ref 22–32)
Chloride: 101 mmol/L (ref 101–111)
Creatinine, Ser: 1.05 mg/dL — ABNORMAL HIGH (ref 0.44–1.00)
GFR calc Af Amer: 56 mL/min — ABNORMAL LOW (ref >60)
GFR, EST NON AFRICAN AMERICAN: 48 mL/min — AB (ref >60)
GLUCOSE: 106 mg/dL — AB (ref 65–99)
Potassium: 4.4 mmol/L (ref 3.5–5.1)
Sodium: 136 mmol/L (ref 135–145)

## 2016-11-29 LAB — VITAMIN B12: Vitamin B-12: 1077 pg/mL — ABNORMAL HIGH (ref 180–914)

## 2016-11-29 LAB — T4, FREE: Free T4: 2.37 ng/dL — ABNORMAL HIGH (ref 0.61–1.12)

## 2016-11-29 LAB — CBC
HEMATOCRIT: 30.3 % — AB (ref 36.0–46.0)
HEMOGLOBIN: 9.3 g/dL — AB (ref 12.0–15.0)
MCH: 24.6 pg — AB (ref 26.0–34.0)
MCHC: 30.7 g/dL (ref 30.0–36.0)
MCV: 80.2 fL (ref 78.0–100.0)
Platelets: 251 10*3/uL (ref 150–400)
RBC: 3.78 MIL/uL — ABNORMAL LOW (ref 3.87–5.11)
RDW: 17 % — AB (ref 11.5–15.5)
WBC: 8.3 10*3/uL (ref 4.0–10.5)

## 2016-11-29 MED ORDER — FUROSEMIDE 10 MG/ML IJ SOLN
40.0000 mg | Freq: Two times a day (BID) | INTRAMUSCULAR | Status: DC
  Administered 2016-11-29: 40 mg via INTRAVENOUS
  Filled 2016-11-29: qty 4

## 2016-11-29 MED ORDER — DILTIAZEM HCL ER COATED BEADS 240 MG PO CP24
240.0000 mg | ORAL_CAPSULE | Freq: Every day | ORAL | Status: DC
  Administered 2016-11-29: 240 mg via ORAL
  Filled 2016-11-29 (×2): qty 1

## 2016-11-29 MED ORDER — ENSURE ENLIVE PO LIQD
237.0000 mL | Freq: Two times a day (BID) | ORAL | Status: DC
  Administered 2016-11-30 – 2016-12-04 (×6): 237 mL via ORAL

## 2016-11-29 MED ORDER — LEVALBUTEROL TARTRATE 45 MCG/ACT IN AERO: 2.0000 | INHALATION_SPRAY | Freq: Four times a day (QID) | RESPIRATORY_TRACT | Status: DC

## 2016-11-29 MED ORDER — POTASSIUM CHLORIDE CRYS ER 10 MEQ PO TBCR
30.0000 meq | EXTENDED_RELEASE_TABLET | Freq: Two times a day (BID) | ORAL | Status: DC
  Administered 2016-11-29 (×2): 30 meq via ORAL
  Filled 2016-11-29 (×2): qty 1

## 2016-11-29 MED ORDER — FERROUS SULFATE 325 (65 FE) MG PO TABS
325.0000 mg | ORAL_TABLET | Freq: Every day | ORAL | Status: DC
  Administered 2016-11-30 – 2016-12-04 (×4): 325 mg via ORAL
  Filled 2016-11-29 (×4): qty 1

## 2016-11-29 MED ORDER — LEVALBUTEROL HCL 0.63 MG/3ML IN NEBU
0.6300 mg | INHALATION_SOLUTION | Freq: Four times a day (QID) | RESPIRATORY_TRACT | Status: DC | PRN
  Administered 2016-11-29: 0.63 mg via RESPIRATORY_TRACT
  Filled 2016-11-29: qty 3

## 2016-11-29 MED ORDER — POLYETHYLENE GLYCOL 3350 17 G PO PACK
17.0000 g | PACK | Freq: Every day | ORAL | Status: DC
  Administered 2016-11-30 – 2016-12-04 (×4): 17 g via ORAL
  Filled 2016-11-29 (×4): qty 1

## 2016-11-29 MED ORDER — RIVAROXABAN 15 MG PO TABS
15.0000 mg | ORAL_TABLET | Freq: Every day | ORAL | Status: DC
  Administered 2016-11-29 – 2016-12-03 (×5): 15 mg via ORAL
  Filled 2016-11-29 (×5): qty 1

## 2016-11-29 MED ORDER — FOLIC ACID 1 MG PO TABS
1.0000 mg | ORAL_TABLET | Freq: Every day | ORAL | Status: DC
  Administered 2016-11-29 – 2016-12-04 (×6): 1 mg via ORAL
  Filled 2016-11-29 (×6): qty 1

## 2016-11-29 MED ORDER — HEPARIN (PORCINE) IN NACL 100-0.45 UNIT/ML-% IJ SOLN
700.0000 [IU]/h | INTRAMUSCULAR | Status: DC
  Administered 2016-11-29: 700 [IU]/h via INTRAVENOUS
  Filled 2016-11-29: qty 250

## 2016-11-29 NOTE — Progress Notes (Signed)
[facility] Progress Note  Patient: Michele Diaz   PCP: Walker Kehr, MD DOB: March 20, 1932   DOA: 11/28/2016   DOS: 11/29/2016   Date of Service: the patient was seen and examined on 11/29/2016  Subjective: Confusion seems to have resolved. Complains about having chest pressure. Nausea resolved. One episode of vomiting at home. No abdominal pain at present. Complains about shortness of breath as well as bilateral leg swelling. Also has wheezing  Brief hospital course: Pt. with PMH of persistent A. fib, HTN, hypothyroidism, asthma, chronic diastolic CHF, GERD, autoimmune hepatitis on immunosuppressive medications, CVA, breast cancer S/P lumpectomy; admitted on 11/28/2016, with complaint of confusion as well as shortness of breath, was found to have acute encephalopathy as well as acute on chronic diastolic heart failure. Currently further plan is continue IV diuresis and further workup for encephalopathy.  Assessment and Plan: 1. Acute metabolic encephalopathy. Patient presented with complaints of confusion as well as hallucination. I suspect that this is multifactorial in the setting of hypoxia due to CHF exacerbation with use of psychotropic medications. It appears that the patient is coming close to her baseline. Possibility of acute CVA cannot be ruled out. Unremarkable CT had, I will get MRI brain without contrast. UA was checked patient has a symptomatic bacteriuria, but UA has high epithelial cells as well as no nitrates and therefore I do not suspect that the patient has UTI. Check V-03, start on folic acid supplementation.  2. Acute hypoxic respiratory failure. 85%  on room air currently requiring 4 L Acute on chronic diastolic CHF. Atrial fibrillation with RVR. Elevated troponin-demand ischemia. Asthma  Cardiology consulted, feels that her elevated troponin may not be accurate, EKG without any ischemic changes, patient did not have any chest pain, patient was  transitioned to therapeutic heparin from Xarelto but based on cardiogenic recommendation now transition back to Xarelto for anticoagulation for A. fib. Patient was started on Cardizem infusion but could not tolerate it and her blood pressure dropped. Currently heart rate is in the range of 110 to 120s. Cardiology-EP recommends resume home Cardizem dosing. Monitor Strict ins and outs, cardiac diet, daily weight. I will check echocardiogram.  Patient has history of asthma and uses albuterol at home, I would use of Xopenex inhaler scheduled as well as Xopenex nebulizers as needed for wheezing.  3. Autoimmune hepatitis. Continue Imuran Monitor.  4. Hypothyroidism. Recently her thyroid dose have been increased. With RVR I will check free T4.  5. Chronic anemia. Hb has been stable. No active bleeding. Resume iron supplementation.  Bowel regimen: last BM prior to admission Diet: Cardiac diet DVT Prophylaxis: subcutaneous Heparin  Advance goals of care discussion: Full code  Family Communication: family was present at bedside, at the time of interview. The pt provided permission to discuss medical plan with the family. Opportunity was given to ask question and all questions were answered satisfactorily.   Disposition:  Discharge to home. Expected discharge date: 12/02/2016, improvement in hypoxia as well as CHF  Consultants: Cardiology Procedures: Echocardiogram-pending  Antibiotics: Anti-infectives    None     Objective: Physical Exam: Vitals:   11/29/16 0235 11/29/16 1311 11/29/16 1342 11/29/16 1358  BP: 116/89 106/70 117/83   Pulse: (!) 46 (!) 108 66 (!) 108  Resp:      Temp:      TempSrc:      SpO2: 94% 100% 100% 100%  Weight:      Height:        Intake/Output Summary (  Last 24 hours) at 11/29/16 1533 Last data filed at 11/29/16 1454  Gross per 24 hour  Intake           175.92 ml  Output             2000 ml  Net         -1824.08 ml   Filed Weights   11/28/16  1544  Weight: 49.2 kg (108 lb 6.4 oz)   General: Alert, Awake and Oriented to Time, Place and Person. Appear in moderate distress, affect appropriate Eyes: PERRL, Conjunctiva normal ENT: Oral Mucosa clear moist. Neck: positive JVD, no Abnormal Mass Or lumps Cardiovascular: S1 and S2 Present, aortic systolic Murmur, Respiratory: Bilateral Air entry equal and Decreased, no use of accessory muscle, basal Crackles, bilateral wheezes Abdomen: Bowel Sound present, Soft and no tenderness Skin: no redness, no Rash, no induration Extremities: bilateral Pedal edema, no calf tenderness Neurologic: Grossly no focal neuro deficit. Bilaterally Equal motor strength  Data Reviewed: CBC:  Recent Labs Lab 11/28/16 2118 11/29/16 0305  WBC 6.7 8.3  NEUTROABS 5.2  --   HGB 9.4* 9.3*  HCT 30.2* 30.3*  MCV 81.0 80.2  PLT 258 865   Basic Metabolic Panel:  Recent Labs Lab 11/28/16 2118 11/29/16 0305  NA 136 136  K 3.0* 4.4  CL 103 101  CO2 25 26  GLUCOSE 106* 106*  BUN 27* 29*  CREATININE 1.08* 1.05*  CALCIUM 8.8* 8.8*  MG 2.1  --     Liver Function Tests:  Recent Labs Lab 11/28/16 2118  AST 67*  ALT 23  ALKPHOS 64  BILITOT 2.2*  PROT 7.8  ALBUMIN 3.5   No results for input(s): LIPASE, AMYLASE in the last 168 hours. No results for input(s): AMMONIA in the last 168 hours. Coagulation Profile:  Recent Labs Lab 11/28/16 2118  INR 1.60   Cardiac Enzymes:  Recent Labs Lab 11/28/16 2118 11/29/16 0305 11/29/16 0734  TROPONINI 0.03* 1.42* 0.04*   BNP (last 3 results) No results for input(s): PROBNP in the last 8760 hours. CBG: No results for input(s): GLUCAP in the last 168 hours. Studies: X-ray Chest Pa And Lateral  Result Date: 11/28/2016 CLINICAL DATA:  Confusion.  History of asthma, cancer, hypertension. EXAM: CHEST  2 VIEW COMPARISON:  08/01/2016 FINDINGS: Diffuse cardiac enlargement. Pulmonary vascular congestion. Interstitial infiltrates in the lungs consistent  with interstitial edema. Small bilateral pleural effusions. Atelectasis in the right lung base. Progression since previous study. Chronic right apical pleural thickening. Fluid in the right minor fissure. Calcified and tortuous aorta. IMPRESSION: Cardiac enlargement with pulmonary vascular congestion, interstitial edema, and small bilateral pleural effusions progressing since prior study. Atelectasis in the right lung base. Electronically Signed   By: Lucienne Capers M.D.   On: 11/28/2016 22:50   Ct Head Wo Contrast  Result Date: 11/28/2016 CLINICAL DATA:  83 y/o  F; confusion. EXAM: CT HEAD WITHOUT CONTRAST TECHNIQUE: Contiguous axial images were obtained from the base of the skull through the vertex without intravenous contrast. COMPARISON:  01/07/2013 CT of the head and MRI of the head. FINDINGS: Brain: Interval left superior cerebellar chronic appearing infarct. No evidence for large territory infarct, focal mass effect, or intracranial hemorrhage. Stable moderate chronic microvascular ischemic changes in white matter and mild brain parenchymal volume loss. Stable chronic lacunar infarct in the right anterior corona radiata. No hydrocephalus, extra-axial collection, or effacement of basilar cisterns. Vascular: Moderate calcific atherosclerosis of cavernous and paraclinoid internal carotid arteries. Skull: Normal. Negative  for fracture or focal lesion. Sinuses/Orbits: Small left maxillary sinus mucous retention cyst. Bilateral intra-ocular lens replacement. Otherwise negative. Other: None. IMPRESSION: 1.  No acute intracranial abnormality identified. 2. Interval chronic appearing infarct in the left superior cerebellar hemisphere from 2014. 3. Stable moderate microvascular ischemic changes and mild brain parenchymal volume loss. Electronically Signed   By: Kristine Garbe M.D.   On: 11/28/2016 22:07    Scheduled Meds: . azaTHIOprine  50 mg Oral Daily  . diltiazem  240 mg Oral Daily  . folic  acid  1 mg Oral Daily  . furosemide  40 mg Intravenous BID  . levothyroxine  100 mcg Oral QAC breakfast  . metoprolol tartrate  25 mg Oral BID  . potassium chloride  30 mEq Oral BID  . Rivaroxaban  15 mg Oral Q supper  . sodium chloride flush  3 mL Intravenous Q12H   Continuous Infusions: PRN Meds: acetaminophen **OR** acetaminophen, levalbuterol, prochlorperazine  Time spent: 30 minutes  Author: Berle Mull, MD [facility] Pager: [phone] 11/29/2016 3:33 PM  If 7PM-7AM, please contact night-coverage at [email or web address], password [email or web address]

## 2016-11-29 NOTE — Consult Note (Signed)
CARDIOLOGY CONSULT NOTE     Primary Care Physician: Walker Kehr, MD Referring Physician:  Dr Posey Pronto  Admit Date: 11/28/2016  Reason for consultation:  afib management  Michele Diaz is a 83 y.o. female with a h/o persistent afib, hypertension, and hypothyroidism who is admitted with worsening confusion.  Her family has recently noticed worsening confusion.  She has has some SOB over the past week.  She also has afib for which she has been treated with rate control and anticoagulation.  She underwent cardvioersion 5/17 but has been in afib since at least November per ekgs in epic.  Today, she denies symptoms of palpitations, chest pain,  dizziness, presyncope, syncope, or neurologic sequela. The patient is tolerating medications without difficulties and is otherwise without complaint today.   Past Medical History:  Diagnosis Date  . Allergic rhinitis   . Anxiety   . Arthritis    "hips, hands, feet; pretty much feels like all over" (01/07/2013)  . Asthma   . Breast cancer ([place]) ~ 2008   "right" (01/07/2013)  . Chronic lower back pain   . Daily headache    "related to temporal arteritis" (01/07/2013)  . Depression   . Diastolic dysfunction   . Diverticulitis of colon   . Fibromyalgia   . GERD (gastroesophageal reflux disease)   . Giant cell arteritis ([place]) 2003   devashwar  . Hepatitis, autoimmune ([place])   . Herpes zoster 2011  . Hyperlipidemia    statins increased LFTs  . Hypertension   . Hypothyroid   . Persistent atrial fibrillation ([place])    a. s/p TEE/DCCV in 01/2016  . Stroke ([place])   . Temporal arteritis ([place])   . Unspecified vitamin D deficiency   . Weight loss 6/11   Past Surgical History:  Procedure Laterality Date  . ABDOMINAL HYSTERECTOMY  1988  . APPENDECTOMY    . BREAST BIOPSY Right   . BREAST LUMPECTOMY Right   . CARDIOVERSION N/A 02/06/2016   Procedure: CARDIOVERSION;  Surgeon: Skeet Latch, MD;  Location: [facility];  Service: Cardiovascular;   Laterality: N/A;  . CHOLECYSTECTOMY    . DILATION AND CURETTAGE OF UTERUS    . TEE WITHOUT CARDIOVERSION N/A 02/06/2016   Procedure: TRANSESOPHAGEAL ECHOCARDIOGRAM (TEE);  Surgeon: Skeet Latch, MD;  [facility] ENDOSCOPY;  Service: Cardiovascular;  Laterality: N/A;  . TONSILLECTOMY      . azaTHIOprine  50 mg Oral Daily  . folic acid  1 mg Oral Daily  . furosemide  40 mg Intravenous Daily  . levothyroxine  100 mcg Oral QAC breakfast  . metoprolol tartrate  25 mg Oral BID  . potassium chloride  30 mEq Oral BID  . sodium chloride flush  3 mL Intravenous Q12H   . heparin 700 Units/hr (11/29/16 1011)    Allergies  Allergen Reactions  . Atorvastatin Other (See Comments)    Elevated liver enzymes  . Celecoxib Shortness Of Breath, Itching and Rash    redness  . Rosuvastatin Other (See Comments)    Elevated liver enzymes  . Codeine Nausea Only  . Penicillins Hives    Social History   Social History  . Marital status: Widowed    Spouse name: N/A  . Number of children: 2  . Years of education: N/A   Occupational History  . retired Retired   Social History Main Topics  . Smoking status: Never Smoker  . Smokeless tobacco: Never Used  . Alcohol use No  . Drug use: No  .  Sexual activity: No   Other Topics Concern  . Not on file   Social History Narrative  . No narrative on file    Family History  Problem Relation Age of Onset  . Diabetes Mother   . Heart attack Mother     died of MI at age 24  . Cancer Brother   . Heart disease Brother   . Cancer Sister   . Heart disease Sister     MI at age 15  . Alzheimer's disease Sister   . Hypertension Other     ROS- pt is confused and unable to provide  Physical Exam: Telemetry: afib with elevated V rates Vitals:   11/28/16 2141 11/29/16 0210 11/29/16 0230 11/29/16 0235  BP: 130/89 (!) 87/65 102/79 116/89  Pulse: (!) 104 (!) 105 (!) 54 (!) 46  Resp: (!) 22     Temp: 97.5 F (36.4 C)     TempSrc: Oral       SpO2: 94% 100% 96% 94%  Weight:      Height:        GEN- The patient is thin and chroinically ill appearing, confused, unable to provide full history Head- normocephalic, atraumatic Eyes-  Sclera clear, conjunctiva pink Ears- hearing intact Oropharynx- clear Neck- supple,   Lungs- decreased BS at bases, normal work of breathing Heart- tachycardic irregular rhythm GI- soft, NT, ND, + BS Extremities- no clubbing, cyanosis, +1  edema MS- diffuse muscle atrophy Skin- no rash or lesion Psych- confused Neuro- strength and sensation are intact  EKG:  I have reviewed ecgs going back to 7/17 (last time she had a sinus ekg),  All subsequent ekgs reveal  afib,  No ischemic changes  Labs:   Lab Results  Component Value Date   WBC 8.3 11/29/2016   HGB 9.3 (L) 11/29/2016   HCT 30.3 (L) 11/29/2016   MCV 80.2 11/29/2016   PLT 251 11/29/2016    Recent Labs Lab 11/28/16 2118 11/29/16 0305  NA 136 136  K 3.0* 4.4  CL 103 101  CO2 25 26  BUN 27* 29*  CREATININE 1.08* 1.05*  CALCIUM 8.8* 8.8*  PROT 7.8  --   BILITOT 2.2*  --   ALKPHOS 64  --   ALT 23  --   AST 67*  --   GLUCOSE 106* 106*   Lab Results  Component Value Date   CKTOTAL 133 08/06/2011   TROPONINI 0.04 (HH) 11/29/2016    Lab Results  Component Value Date   CHOL 178 01/08/2013   CHOL 193 07/15/2011   CHOL 183 11/13/2010   Lab Results  Component Value Date   HDL 58 01/08/2013   HDL 59.70 07/15/2011   HDL 59.70 11/13/2010   Lab Results  Component Value Date   LDLCALC 75 01/08/2013   LDLCALC 109 (H) 07/15/2011   LDLCALC 104 (H) 11/13/2010   Lab Results  Component Value Date   TRIG 226 (H) 01/08/2013   TRIG 124.0 07/15/2011   TRIG 99.0 11/13/2010   Lab Results  Component Value Date   CHOLHDL 3.1 01/08/2013   CHOLHDL 3 07/15/2011   CHOLHDL 3 11/13/2010   Lab Results  Component Value Date   LDLDIRECT 125.9 10/06/2007   LDLDIRECT 125.1 05/25/2007      Radiology:  CXR is reviewed  Echo:   Echo and TEE are reviewed from 5/17  ASSESSMENT AND PLAN:   1. Persistent afib She appears to have been in afib since at least November.  chads2vasc score is at least 6.  She has been treated with xarelto.  As head CT is unrevealing, I would resume xarelto if no procedures planned (no CV procedures are planned) and stop heparin drip.  I would favor a rate control strategy at this time.  It seems that she had been doing well with this until recently.  She has a preserved EF and should do best with adequate rate control.  No reason to not be on diltiazem.  I will resume home diltiazem dosing.  2. Acute diastolic dysfunction Preserved EF by echo 5/17 2 gram sodium diet Strict Is and Os Continue IV lasix for now Better rate control will be helpful  3. Elevated troponin Denies chest pain,  Likely demand ischemia in nature due to RVR No ischemic ekg changes I think that the 3:05 am troponin is not within the trend of others and may not be accurate. Would treat conservatively Ok to switch heparin back to xarelto from my standpoint  4. htn Stable No change required today  Given advanced age, a conservative approach is best.  Thompson Grayer, MD 11/29/2016  10:25 AM

## 2016-11-29 NOTE — Plan of Care (Signed)
Problem: Safety: Goal: Ability to remain free from injury will improve Outcome: Progressing Pt was able to tell me how to reach the nurse or tech for assistance by using her call light or her phone and dialing the numbers on the white board. The phone and call light is in the bed and her bedside stand is beside her bed with her personal belongings within reach. She has family at her bedside but when they leave it was explained to her that her bed alarm would be turned on and why and pt. Verbalized understanding.

## 2016-11-29 NOTE — Discharge Instructions (Addendum)
Information on my medicine - XARELTO (Rivaroxaban)  This medication education was reviewed with me or my healthcare representative as part of my discharge preparation.  The pharmacist that spoke with me during my hospital stay was:  Carlean Jews, [facility]  Why was Xarelto prescribed for you? Xarelto was prescribed for you to reduce the risk of a blood clot forming that can cause a stroke if you have a medical condition called atrial fibrillation (a type of irregular heartbeat).  What do you need to know about xarelto ? Take your Xarelto ONCE DAILY at the same time every day with your evening meal. If you have difficulty swallowing the tablet whole, you may crush it and mix in applesauce just prior to taking your dose.  Take Xarelto exactly as prescribed by your doctor and DO NOT stop taking Xarelto without talking to the doctor who prescribed the medication.  Stopping without other stroke prevention medication to take the place of Xarelto may increase your risk of developing a clot that causes a stroke.  Refill your prescription before you run out.  After discharge, you should have regular check-up appointments with your healthcare provider that is prescribing your Xarelto.  In the future your dose may need to be changed if your kidney function or weight changes by a significant amount.  What do you do if you miss a dose? If you are taking Xarelto ONCE DAILY and you miss a dose, take it as soon as you remember on the same day then continue your regularly scheduled once daily regimen the next day. Do not take two doses of Xarelto at the same time or on the same day.   Important Safety Information A possible side effect of Xarelto is bleeding. You should call your healthcare provider right away if you experience any of the following: ? Bleeding from an injury or your nose that does not stop. ? Unusual colored urine (red or dark brown) or unusual colored stools (red or  black). ? Unusual bruising for unknown reasons. ? A serious fall or if you hit your head (even if there is no bleeding).  Some medicines may interact with Xarelto and might increase your risk of bleeding while on Xarelto. To help avoid this, consult your healthcare provider or pharmacist prior to using any new prescription or non-prescription medications, including herbals, vitamins, non-steroidal anti-inflammatory drugs (NSAIDs) and supplements.  This website has more information on Xarelto: [email or web address]    Atrial Fibrillation Atrial fibrillation is a type of irregular or rapid heartbeat (arrhythmia). In atrial fibrillation, the heart quivers continuously in a chaotic pattern. This occurs when parts of the heart receive disorganized signals that make the heart unable to pump blood normally. This can increase the risk for stroke, heart failure, and other heart-related conditions. There are different types of atrial fibrillation, including:  Paroxysmal atrial fibrillation. This type starts suddenly, and it usually stops on its own shortly after it starts.  Persistent atrial fibrillation. This type often lasts longer than a week. It may stop on its own or with treatment.  Long-lasting persistent atrial fibrillation. This type lasts longer than 12 months.  Permanent atrial fibrillation. This type does not go away. Talk with your health care provider to learn about the type of atrial fibrillation that you have. What are the causes? This condition is caused by some heart-related conditions or procedures, including:  A heart attack.  Coronary artery disease.  Heart failure.  Heart valve conditions.  High blood pressure.  Inflammation  of the sac that surrounds the heart (pericarditis).  Heart surgery.  Certain heart rhythm disorders, such as Wolf-Parkinson-White syndrome. Other causes include:  Pneumonia.  Obstructive sleep apnea.  Blockage of an artery in the lungs  (pulmonary embolism, or PE).  Lung cancer.  Chronic lung disease.  Thyroid problems, especially if the thyroid is overactive (hyperthyroidism).  Caffeine.  Excessive alcohol use or illegal drug use.  Use of some medicines, including certain decongestants and diet pills. Sometimes, the cause cannot be found. What increases the risk? This condition is more likely to develop in:  People who are older in age.  People who smoke.  People who have diabetes mellitus.  People who are overweight (obese).  Athletes who exercise vigorously. What are the signs or symptoms? Symptoms of this condition include:  A feeling that your heart is beating rapidly or irregularly.  A feeling of discomfort or pain in your chest.  Shortness of breath.  Sudden light-headedness or weakness.  Getting tired easily during exercise. In some cases, there are no symptoms. How is this diagnosed? Your health care provider may be able to detect atrial fibrillation when taking your pulse. If detected, this condition may be diagnosed with:  An electrocardiogram (ECG).  A Holter monitor test that records your heartbeat patterns over a 24-hour period.  Transthoracic echocardiogram (TTE) to evaluate how blood flows through your heart.  Transesophageal echocardiogram (TEE) to view more detailed images of your heart.  A stress test.  Imaging tests, such as a CT scan or chest X-ray.  Blood tests. How is this treated? The main goals of treatment are to prevent blood clots from forming and to keep your heart beating at a normal rate and rhythm. The type of treatment that you receive depends on many factors, such as your underlying medical conditions and how you feel when you are experiencing atrial fibrillation. This condition may be treated with:  Medicine to slow down the heart rate, bring the hearts rhythm back to normal, or prevent clots from forming.  Electrical cardioversion. This is a procedure  that resets your hearts rhythm by delivering a controlled, low-energy shock to the heart through your skin.  Different types of ablation, such as catheter ablation, catheter ablation with pacemaker, or surgical ablation. These procedures destroy the heart tissues that send abnormal signals. When the pacemaker is used, it is placed under your skin to help your heart beat in a regular rhythm. Follow these instructions at home:  Take over-the counter and prescription medicines only as told by your health care provider.  If your health care provider prescribed a blood-thinning medicine (anticoagulant), take it exactly as told. Taking too much blood-thinning medicine can cause bleeding. If you do not take enough blood-thinning medicine, you will not have the protection that you need against stroke and other problems.  Do not use tobacco products, including cigarettes, chewing tobacco, and e-cigarettes. If you need help quitting, ask your health care provider.  If you have obstructive sleep apnea, manage your condition as told by your health care provider.  Do not drink alcohol.  Do not drink beverages that contain caffeine, such as coffee, soda, and tea.  Maintain a healthy weight. Do not use diet pills unless your health care provider approves. Diet pills may make heart problems worse.  Follow diet instructions as told by your health care provider.  Exercise regularly as told by your health care provider.  Keep all follow-up visits as told by your health care provider. This  is important. How is this prevented?  Avoid drinking beverages that contain caffeine or alcohol.  Avoid certain medicines, especially medicines that are used for breathing problems.  Avoid certain herbs and herbal medicines, such as those that contain ephedra or ginseng.  Do not use illegal drugs, such as cocaine and amphetamines.  Do not smoke.  Manage your high blood pressure. Contact a health care provider  if:  You notice a change in the rate, rhythm, or strength of your heartbeat.  You are taking an anticoagulant and you notice increased bruising.  You tire more easily when you exercise or exert yourself. Get help right away if:  You have chest pain, abdominal pain, sweating, or weakness.  You feel nauseous.  You notice blood in your vomit, bowel movement, or urine.  You have shortness of breath.  You suddenly have swollen feet and ankles.  You feel dizzy.  You have sudden weakness or numbness of the face, arm, or leg, especially on one side of the body.  You have trouble speaking, trouble understanding, or both (aphasia).  Your face or your eyelid droops on one side. These symptoms may represent a serious problem that is an emergency. Do not wait to see if the symptoms will go away. Get medical help right away. Call your local emergency services (911 in the [place] Do not drive yourself to the hospital. This information is not intended to replace advice given to you by your health care provider. Make sure you discuss any questions you have with your health care provider. Document Released: 09/01/2005 Document Revised: 01/09/2016 Document Reviewed: 12/27/2014 Elsevier Interactive Patient Education  2017 [place].   Heart Failure Heart failure is a condition in which the heart has trouble pumping blood because it has become weak or stiff. This means that the heart does not pump blood efficiently for the body to work well. For some people with heart failure, fluid may back up into the lungs and there may be swelling (edema) in the lower legs. Heart failure is usually a long-term (chronic) condition. It is important for you to take good care of yourself and follow the treatment plan from your health care provider. What are the causes? This condition is caused by some health problems, including:  High blood pressure (hypertension). Hypertension causes the heart muscle to work  harder than normal. High blood pressure eventually causes the heart to become stiff and weak.  Coronary artery disease (CAD). CAD is the buildup of cholesterol and fat (plaques) in the arteries of the heart.  Heart attack (myocardial infarction). Injured tissue, which is caused by the heart attack, does not contract as well and the heart's ability to pump blood is weakened.  Abnormal heart valves. When the heart valves do not open and close properly, the heart muscle must pump harder to keep the blood flowing.  Heart muscle disease (cardiomyopathy or myocarditis). Heart muscle disease is damage to the heart muscle from a variety of causes, such as drug or alcohol abuse, infections, or unknown causes. These can increase the risk of heart failure.  Lung disease. When the lungs do not work properly, the heart must work harder. What increases the risk? Risk of heart failure increases as a person ages. This condition is also more likely to develop in people who:  Are overweight.  Are female.  Smoke or chew tobacco.  Abuse alcohol or illegal drugs.  Have taken medicines that can damage the heart, such as chemotherapy drugs.  Have diabetes.  High blood sugar (glucose) is associated with high fat (lipid) levels in the blood.  Diabetes can also damage tiny blood vessels that carry nutrients to the heart muscle.  Have abnormal heart rhythms.  Have thyroid problems.  Have low blood counts (anemia). What are the signs or symptoms? Symptoms of this condition include:  Shortness of breath with activity, such as when climbing stairs.  Persistent cough.  Swelling of the feet, ankles, legs, or abdomen.  Unexplained weight gain.  Difficulty breathing when lying flat (orthopnea).  Waking from sleep because of the need to sit up and get more air.  Rapid heartbeat.  Fatigue and loss of energy.  Feeling light-headed, dizzy, or close to fainting.  Loss of  appetite.  Nausea.  Increased urination during the night (nocturia).  Confusion. How is this diagnosed? This condition is diagnosed based on:  Medical history, symptoms, and a physical exam.  Diagnostic tests, which may include:  Echocardiogram.  Electrocardiogram (ECG).  Chest X-ray.  Blood tests.  Exercise stress test.  Radionuclide scans.  Cardiac catheterization and angiogram. How is this treated? Treatment for this condition is aimed at managing the symptoms of heart failure. Medicines, behavioral changes, or other treatments may be necessary to treat heart failure. Medicines  These may include:  Angiotensin-converting enzyme (ACE) inhibitors. This type of medicine blocks the effects of a blood protein called angiotensin-converting enzyme. ACE inhibitors relax (dilate) the blood vessels and help to lower blood pressure.  Angiotensin receptor blockers (ARBs). This type of medicine blocks the actions of a blood protein called angiotensin. ARBs dilate the blood vessels and help to lower blood pressure.  Water pills (diuretics). Diuretics cause the kidneys to remove salt and water from the blood. The extra fluid is removed through urination, leaving a lower volume of blood that the heart has to pump.  Beta blockers. These improve heart muscle strength and they prevent the heart from beating too quickly.  Digoxin. This increases the force of the heartbeat. Healthy behavior changes  These may include:  Reaching and maintaining a healthy weight.  Stopping smoking or chewing tobacco.  Eating heart-healthy foods.  Limiting or avoiding alcohol.  Stopping use of street drugs (illegal drugs).  Physical activity. Other treatments  These may include:  Surgery to open blocked coronary arteries or repair damaged heart valves.  Placement of a biventricular pacemaker to improve heart muscle function (cardiac resynchronization therapy). This device paces both the right  ventricle and left ventricle.  Placement of a device to treat serious abnormal heart rhythms (implantable cardioverter defibrillator, or ICD).  Placement of a device to improve the pumping ability of the heart (left ventricular assist device, or LVAD).  Heart transplant. This can cure heart failure, and it is considered for certain patients who do not improve with other therapies. Follow these instructions at home: Medicines   Take over-the-counter and prescription medicines only as told by your health care provider. Medicines are important in reducing the workload of your heart, slowing the progression of heart failure, and improving your symptoms.  Do not stop taking your medicine unless your health care provider told you to do that.  Do not skip any dose of medicine.  Refill your prescriptions before you run out of medicine. You need your medicines every day. Eating and drinking    Eat heart-healthy foods. Talk with a dietitian to make an eating plan that is right for you.  Choose foods that contain no trans fat and are low in saturated fat  and cholesterol. Healthy choices include fresh or frozen fruits and vegetables, fish, lean meats, legumes, fat-free or low-fat dairy products, and whole-grain or high-fiber foods.  Limit salt (sodium) if directed by your health care provider. Sodium restriction may reduce symptoms of heart failure. Ask a dietitian to recommend heart-healthy seasonings.  Use healthy cooking methods instead of frying. Healthy methods include roasting, grilling, broiling, baking, poaching, steaming, and stir-frying.  Limit your fluid intake if directed by your health care provider. Fluid restriction may reduce symptoms of heart failure. Lifestyle   Stop smoking or using chewing tobacco. Nicotine and tobacco can damage your heart and your blood vessels. Do not use nicotine gum or patches before talking to your health care provider.  Limit alcohol intake to no more  than 1 drink per day for non-pregnant women and 2 drinks per day for men. One drink equals 12 oz of beer, 5 oz of wine, or 1 oz of hard liquor.  Drinking more than that is harmful to your heart. Tell your health care provider if you drink alcohol several times a week.  Talk with your health care provider about whether any level of alcohol use is safe for you.  If your heart has already been damaged by alcohol or you have severe heart failure, drinking alcohol should be stopped completely.  Stop use of illegal drugs.  Lose weight if directed by your health care provider. Weight loss may reduce symptoms of heart failure.  Do moderate physical activity if directed by your health care provider. People who are elderly and people with severe heart failure should consult with a health care provider for physical activity recommendations. Monitor important information   Weigh yourself every day. Keeping track of your weight daily helps you to notice excess fluid sooner.  Weigh yourself every morning after you urinate and before you eat breakfast.  Wear the same amount of clothing each time you weigh yourself.  Record your daily weight. Provide your health care provider with your weight record.  Monitor and record your blood pressure as told by your health care provider.  Check your pulse as told by your health care provider. Dealing with extreme temperatures   If the weather is extremely hot:  Avoid vigorous physical activity.  Use air conditioning or fans or seek a cooler location.  Avoid caffeine and alcohol.  Wear loose-fitting, lightweight, and light-colored clothing.  If the weather is extremely cold:  Avoid vigorous physical activity.  Layer your clothes.  Wear mittens or gloves, a hat, and a scarf when you go outside.  Avoid alcohol. General instructions   Manage other health conditions such as hypertension, diabetes, thyroid disease, or abnormal heart rhythms as told  by your health care provider.  Learn to manage stress. If you need help to do this, ask your health care provider.  Plan rest periods when fatigued.  Get ongoing education and support as needed.  Participate in or seek rehabilitation as needed to maintain or improve independence and quality of life.  Stay up to date with immunizations. Keeping current on pneumococcal and influenza immunizations is especially important to prevent respiratory infections.  Keep all follow-up visits as told by your health care provider. This is important. Contact a health care provider if:  You have a rapid weight gain.  You have increasing shortness of breath that is unusual for you.  You are unable to participate in your usual physical activities.  You tire easily.  You cough more than normal, especially  with physical activity.  You have any swelling or more swelling in areas such as your hands, feet, ankles, or abdomen.  You are unable to sleep because it is hard to breathe.  You feel like your heart is beating quickly (palpitations).  You become dizzy or light-headed when you stand up. Get help right away if:  You have difficulty breathing.  You notice or your family notices a change in your awareness, such as having trouble staying awake or having difficulty with concentration.  You have pain or discomfort in your chest.  You have an episode of fainting (syncope). This information is not intended to replace advice given to you by your health care provider. Make sure you discuss any questions you have with your health care provider. Document Released: 09/01/2005 Document Revised: 05/06/2016 Document Reviewed: 03/26/2016 Elsevier Interactive Patient Education  2017 [employer].

## 2016-11-29 NOTE — Evaluation (Signed)
Physical Therapy Evaluation Patient Details Name: Michele Diaz MRN: [id number] DOB: 1932/05/23 Today's Date: 11/29/2016   History of Present Illness  Pt is a 83 y.o. female with a h/o persistent afib, HTN, and hypothyroidism who was admitted 11/28/16 with worsening confusion. She underwent cardioversion 5/17 but has been in afib since at least November per ekgs in epic; she has been treated with medication for rate control and anticoagulation. CT unremarkable. CXR 3/16 shows pulmonary vascular congestion, interstitial edema, and small bilateral pleural effusions. Elevated troponin 3/17, most likely demand ischemia in nature.    Clinical Impression  Pt presents with generalized weakness, decreased activity tolerance, impaired balance, decreased awareness, and an overall decrease in functional mobility secondary to above. PTA, pt indep and living alone. Daughter present and reports steady decline in pt's functional mobility and cognition since 09/2016. Today, pt amb in room with HHA and minA. HR up to 138 with mobility. Pt would benefit from continued acute PT services to maximize functional mobility and independence. May benefit from use of RW next session.     Follow Up Recommendations SNF;Supervision/Assistance - 24 hour    Equipment Recommendations       Recommendations for Other Services       Precautions / Restrictions Precautions Precautions: Fall Precaution Comments: Watch HR Restrictions Weight Bearing Restrictions: No      Mobility  Bed Mobility               General bed mobility comments: Pt received sitting  Transfers Overall transfer level: Needs assistance Equipment used: 1 person hand held assist Transfers: Sit to/from Stand Sit to Stand: Min assist         General transfer comment: Stood x4 from [facility] and chair with HHA and minA for balance.   Ambulation/Gait Ambulation/Gait assistance: Min assist Ambulation Distance (Feet): 40 Feet Assistive device: 1  person hand held assist Gait Pattern/deviations: Step-through pattern;Decreased stride length Gait velocity: Decreased Gait velocity interpretation: Below normal speed for age/gender General Gait Details: Slowed gait, dependent on HHA for balance; looked at feet throughout amb  Stairs            Wheelchair Mobility    Modified Rankin (Stroke Patients Only)       Balance Overall balance assessment: Needs assistance Sitting-balance support: No upper extremity supported;Feet supported Sitting balance-Leahy Scale: Fair     Standing balance support: Single extremity supported;During functional activity Standing balance-Leahy Scale: Poor Standing balance comment: Reliant on single UE support to maintain balance.                              Pertinent Vitals/Pain Pain Assessment: No/denies pain    Home Living Family/patient expects to be discharged to:: Skilled nursing facility Living Arrangements: Alone Available Help at Discharge: Family;Available PRN/intermittently Type of Home: House         Home Equipment: Gilford Rile - 2 wheels      Prior Function Level of Independence: Independent         Comments: Pt indep and living at home alone; family present and reports decline in cognition and functional mobility since Jan 2018, in addition to at least 2 falls.      Hand Dominance        Extremity/Trunk Assessment   Upper Extremity Assessment Upper Extremity Assessment: Generalized weakness    Lower Extremity Assessment Lower Extremity Assessment: Generalized weakness    Cervical / Trunk Assessment Cervical / Trunk Assessment:  Kyphotic  Communication   Communication: HOH  Cognition Arousal/Alertness: Awake/alert Behavior During Therapy: WFL for tasks assessed/performed Overall Cognitive Status: Impaired/Different from baseline Area of Impairment: Orientation;Attention;Memory;Following commands;Safety/judgement;Awareness;Problem  solving Orientation Level: Disoriented to;Time;Situation;Place (Required cues for [facility]) Current Attention Level: Sustained Memory: Decreased short-term memory Following Commands: Follows multi-step commands inconsistently;Follows one step commands with increased time Safety/Judgement: Decreased awareness of safety Awareness: Intellectual Problem Solving: Requires verbal cues;Slow processing General Comments: Daughter present throughout session and reports steady decline in cognition since Jan 2018. During session, pt easily distracted, refered to her daughter as her mother-in-law, and talking about things not present in room.     General Comments General comments (skin integrity, edema, etc.): HR up to 138 and BP 119/81 with amb.     Exercises     Assessment/Plan    PT Assessment Patient needs continued PT services  PT Problem List Decreased strength;Decreased activity tolerance;Decreased balance;Decreased cognition;Decreased mobility;Decreased safety awareness;Cardiopulmonary status limiting activity       PT Treatment Interventions Gait training;DME instruction;Therapeutic activities;Therapeutic exercise;Patient/family education;Stair training;Balance training;Functional mobility training    PT Goals (Current goals can be found in the Care Plan section)  Acute Rehab PT Goals Patient Stated Goal: Return home PT Goal Formulation: With patient Time For Goal Achievement: 12/13/16 Potential to Achieve Goals: Poor    Frequency Min 2X/week   Barriers to discharge Decreased caregiver support      Co-evaluation               End of Session Equipment Utilized During Treatment: Gait belt Activity Tolerance: Patient tolerated treatment well Patient left: in chair;with chair alarm set;with family/visitor present;with call bell/phone within reach Nurse Communication: Mobility status PT Visit Diagnosis: Unsteadiness on feet (R26.81);Muscle weakness (generalized)  (M62.81);Repeated falls (R29.6)    Functional Assessment Tool Used: Clinical judgement Functional Limitation: Mobility: Walking and moving around Mobility: Walking and Moving Around Current Status [phone]): At least 20 percent but less than 40 percent impaired, limited or restricted Mobility: Walking and Moving Around Goal Status [phone]): At least 1 percent but less than 20 percent impaired, limited or restricted    Time: 1447-1508 PT Time Calculation (min) (ACUTE ONLY): 21 min   Charges:   PT Evaluation $PT Eval Moderate Complexity: 1 Procedure     PT G Codes:   PT G-Codes **NOT FOR INPATIENT CLASS** Functional Assessment Tool Used: Clinical judgement Functional Limitation: Mobility: Walking and moving around Mobility: Walking and Moving Around Current Status (G6440): At least 20 percent but less than 40 percent impaired, limited or restricted Mobility: Walking and Moving Around Goal Status [phone]): At least 1 percent but less than 20 percent impaired, limited or restricted   Enis Gash, SPT [phone]  [name] 11/29/2016, 3:44 PM

## 2016-11-29 NOTE — Progress Notes (Signed)
PT Cancellation Note  Patient Details Name: Michele Diaz MRN: [id number] DOB: [birth year]   Cancelled Treatment:    Reason Eval/Treat Not Completed: Medical issues which prohibited therapy - elevated troponin, will wait for cardiology follow-up for PT eval as time permits.    [name] 11/29/2016, 8:28 AM

## 2016-11-29 NOTE — Progress Notes (Signed)
Initial Nutrition Assessment  DOCUMENTATION CODES:  Not applicable  INTERVENTION:  Ensure Enlive po BID, each supplement provides 350 kcal and 20 grams of protein  Food preferences  NUTRITION DIAGNOSIS:  Increased nutrient needs related to multiple chronic illnesses as evidenced by estimated nutritional requirements for these conditions  GOAL:  Patient will meet greater than or equal to 90% of their needs  MONITOR:  PO intake, Supplement acceptance, Labs, I & O's  REASON FOR ASSESSMENT:  Malnutrition Screening Tool    ASSESSMENT:  83 y/o female PMHx HTN, HFpEF, HLD, CVA, Afib, Anxiety, Depression, autoimmune hepatitis. Presented with subacute confusion. Reported fall 4-5 weeks ago. Endorses SOB and leg swelling. Admitted for workup of etiology of confusion.   Pt is a fair historian. She reports that the only thing that has changed in regards to her eating habits is that she does not like as many foods as she used to. At baseline, she eats 2 meals per day and will drink Ensure, but not habitually. She takes a Vit D supplement and a Vit A(?) supplement.  Denied any recent n/v/c/d.   She reports a wt loss of a couple lbs. Family states that the pt has told them in the past her UBW was 115 lbs. Pt then states that she was 115 lbs "when I was pregnant with you".   Patient was agreeable to starting supplementation while admitted. RD took food preferences.   NFPE: Displays severe muscle/fat wasting, however, given her weight stability, this is thought to be longstanding.   Labs: Glucose: 1-6, H/H:9.3/30.3 Medications: Imuran   Recent Labs Lab 11/28/16 2118 11/29/16 0305  NA 136 136  K 3.0* 4.4  CL 103 101  CO2 25 26  BUN 27* 29*  CREATININE 1.08* 1.05*  CALCIUM 8.8* 8.8*  MG 2.1  --   GLUCOSE 106* 106*   Diet Order:  Diet Heart Room service appropriate? Yes; Fluid consistency: Thin  Skin:  Reviewed, no issues  Last BM:  3/16  Height:  Ht Readings from Last 1  Encounters:  11/28/16 5\' 2"  (1.575 m)   Weight:  Wt Readings from Last 1 Encounters:  11/28/16 108 lb 6.4 oz (49.2 kg)   Wt Readings from Last 10 Encounters:  11/28/16 108 lb 6.4 oz (49.2 kg)  11/28/16 108 lb (49 kg)  08/27/16 110 lb (49.9 kg)  08/18/16 111 lb (50.3 kg)  08/04/16 112 lb (50.8 kg)  08/01/16 112 lb (50.8 kg)  05/27/16 113 lb (51.3 kg)  02/29/16 110 lb (49.9 kg)  02/14/16 108 lb 9.6 oz (49.3 kg)  02/12/16 111 lb 9.6 oz (50.6 kg)   Ideal Body Weight:  50 kg  BMI:  Body mass index is 19.83 kg/m.  Estimated Nutritional Needs:  Kcal:  1350-1550 kcals (28-32 kcal/kg bw) Protein:  55-65 pro (1.1-1.3 g/kg bw) Fluid:  1.4-1.6 L fluid (73ml/kcal)   EDUCATION NEEDS:  No education needs identified at this time  Burtis Junes RD, LDN, [place] [facility] Pager: [phone] 11/29/2016 3:42 PM

## 2016-11-29 NOTE — Progress Notes (Addendum)
ANTICOAGULATION CONSULT NOTE - Initial Consult  Pharmacy Consult for heparin Indication: atrial fibrillation  Patient Measurements: Height: 5\' 2"  (157.5 cm) Weight: 108 lb 6.4 oz (49.2 kg) IBW/kg (Calculated) : 50.1 Heparin Dosing Weight: 49.2 kg  Vital Signs: Temp: 97.5 F (36.4 C) (03/16 2141) Temp Source: Oral (03/16 2141) BP: 116/89 (03/17 0235) Pulse Rate: 46 (03/17 0235)  Labs:  Recent Labs  11/28/16 2118 11/29/16 0305 11/29/16 0734  HGB 9.4* 9.3*  --   HCT 30.2* 30.3*  --   PLT 258 251  --   APTT 37*  --   --   LABPROT 19.2*  --   --   INR 1.60  --   --   CREATININE 1.08* 1.05*  --   TROPONINI 0.03* 1.42* 0.04*    Estimated Creatinine Clearance: 32.1 mL/min (A) (by C-G formula based on SCr of 1.05 mg/dL (H)).   Assessment: 83yo F with h/o afib, PTA Xarelto for on hold while patient is NPO. Pharmacy has been consulted to dose heparin. Last dose of Xarelto was on 3/15. Baseline aPTT 37s, Hgb 9.3 stable, Plt wnl, no bleeding noted.  Goal of Therapy:  Heparin level 0.3-0.7 units/ml  aPTT  66-102s Monitor platelets by anticoagulation protocol: Yes   Plan:  No heparin holus Start heparin infusion at 700 units/hr Check anti-Xa level in 8 hours and daily while on heparin Continue to monitor H&H and platelets    Gwenlyn Perking, PharmD PGY1 Pharmacy Resident Pager: [phone] 11/29/2016 9:32 AM    ADDENDUM: Madaline Brilliant to switch back to Xarelto per Cards  Plan: D/c heparin Start Xarelto 15 mg PO qPM   Gwenlyn Perking, PharmD PGY1 Pharmacy Resident Pager: [phone] 11/29/2016 10:51 AM

## 2016-11-30 ENCOUNTER — Encounter (HOSPITAL_COMMUNITY): Payer: Self-pay | Admitting: *Deleted

## 2016-11-30 ENCOUNTER — Inpatient Hospital Stay (HOSPITAL_COMMUNITY): Payer: Medicare Other

## 2016-11-30 DIAGNOSIS — G9341 Metabolic encephalopathy: Secondary | ICD-10-CM

## 2016-11-30 DIAGNOSIS — R7989 Other specified abnormal findings of blood chemistry: Secondary | ICD-10-CM

## 2016-11-30 LAB — BASIC METABOLIC PANEL WITH GFR
Anion gap: 9 (ref 5–15)
BUN: 18 mg/dL (ref 6–20)
CO2: 32 mmol/L (ref 22–32)
Calcium: 8.5 mg/dL — ABNORMAL LOW (ref 8.9–10.3)
Chloride: 97 mmol/L — ABNORMAL LOW (ref 101–111)
Creatinine, Ser: 0.87 mg/dL (ref 0.44–1.00)
GFR calc Af Amer: >60 mL/min
GFR calc non Af Amer: >60 mL/min
Glucose, Bld: 86 mg/dL (ref 65–99)
Potassium: 3.1 mmol/L — ABNORMAL LOW (ref 3.5–5.1)
Sodium: 138 mmol/L (ref 135–145)

## 2016-11-30 LAB — CBC
HCT: 30.6 % — ABNORMAL LOW (ref 36.0–46.0)
HEMOGLOBIN: 9.2 g/dL — AB (ref 12.0–15.0)
MCH: 24.4 pg — AB (ref 26.0–34.0)
MCHC: 30.1 g/dL (ref 30.0–36.0)
MCV: 81.2 fL (ref 78.0–100.0)
Platelets: 236 10*3/uL (ref 150–400)
RBC: 3.77 MIL/uL — AB (ref 3.87–5.11)
RDW: 16.6 % — ABNORMAL HIGH (ref 11.5–15.5)
WBC: 6.3 10*3/uL (ref 4.0–10.5)

## 2016-11-30 LAB — BRAIN NATRIURETIC PEPTIDE: B Natriuretic Peptide: 770.6 pg/mL — ABNORMAL HIGH (ref 0.0–100.0)

## 2016-11-30 LAB — CBC WITH DIFFERENTIAL/PLATELET
BASOS ABS: 0 10*3/uL (ref 0.0–0.1)
BASOS PCT: 0 %
EOS ABS: 0.1 10*3/uL (ref 0.0–0.7)
EOS PCT: 1 %
HCT: 29.3 % — ABNORMAL LOW (ref 36.0–46.0)
HEMOGLOBIN: 8.9 g/dL — AB (ref 12.0–15.0)
LYMPHS ABS: 0.5 10*3/uL — AB (ref 0.7–4.0)
Lymphocytes Relative: 8 %
MCH: 25.1 pg — AB (ref 26.0–34.0)
MCHC: 30.4 g/dL (ref 30.0–36.0)
MCV: 82.8 fL (ref 78.0–100.0)
Monocytes Absolute: 0.9 10*3/uL (ref 0.1–1.0)
Monocytes Relative: 12 %
NEUTROS PCT: 79 %
Neutro Abs: 5.6 10*3/uL (ref 1.7–7.7)
PLATELETS: 277 10*3/uL (ref 150–400)
RBC: 3.54 MIL/uL — AB (ref 3.87–5.11)
RDW: 16.5 % — ABNORMAL HIGH (ref 11.5–15.5)
WBC: 7 10*3/uL (ref 4.0–10.5)

## 2016-11-30 LAB — ECHOCARDIOGRAM COMPLETE
FS: 16 % — AB (ref 28–44)
Height: 62 in
IVS/LV PW RATIO, ED: 0.89
LA ID, A-P, ES: 38 mm
LA diam end sys: 38 mm
LA diam index: 2.71 cm/m2
LA vol A4C: 52.1 mL
LA vol index: 42.7 mL/m2
LA vol: 59.8 mL
LV PW d: 9 mm — AB (ref 0.6–1.1)
LVOT area: 2.54 cm2
LVOT diameter: 18 mm
Weight: 1545.6 [oz_av]

## 2016-11-30 LAB — MAGNESIUM: Magnesium: 1.5 mg/dL — ABNORMAL LOW (ref 1.7–2.4)

## 2016-11-30 MED ORDER — FUROSEMIDE 10 MG/ML IJ SOLN
40.0000 mg | Freq: Every day | INTRAMUSCULAR | Status: DC
  Administered 2016-12-01: 40 mg via INTRAVENOUS
  Filled 2016-11-30: qty 4

## 2016-11-30 MED ORDER — POLYETHYLENE GLYCOL 3350 17 G PO PACK: 17.0000 g | PACK | Freq: Every day | ORAL | Status: DC | PRN

## 2016-11-30 MED ORDER — ALBUMIN HUMAN 5 % IV SOLN
12.5000 g | Freq: Once | INTRAVENOUS | Status: AC
  Administered 2016-11-30: 12.5 g via INTRAVENOUS
  Filled 2016-11-30: qty 250

## 2016-11-30 MED ORDER — FUROSEMIDE 10 MG/ML IJ SOLN: 40.0000 mg | Freq: Every day | INTRAMUSCULAR | Status: DC

## 2016-11-30 MED ORDER — METOPROLOL TARTRATE 50 MG PO TABS
50.0000 mg | ORAL_TABLET | Freq: Two times a day (BID) | ORAL | Status: DC
  Administered 2016-12-01 – 2016-12-04 (×6): 50 mg via ORAL
  Filled 2016-11-30 (×7): qty 1

## 2016-11-30 MED ORDER — METOPROLOL TARTRATE 50 MG PO TABS: 50.0000 mg | ORAL_TABLET | Freq: Two times a day (BID) | ORAL | Status: DC

## 2016-11-30 MED ORDER — FUROSEMIDE 10 MG/ML IJ SOLN
40.0000 mg | Freq: Every day | INTRAMUSCULAR | Status: DC
  Filled 2016-11-30: qty 4

## 2016-11-30 MED ORDER — LEVOTHYROXINE SODIUM 88 MCG PO TABS
88.0000 ug | ORAL_TABLET | Freq: Every day | ORAL | Status: DC
  Administered 2016-12-01: 88 ug via ORAL
  Filled 2016-11-30: qty 1

## 2016-11-30 MED ORDER — MAGNESIUM SULFATE 2 GM/50ML IV SOLN
2.0000 g | Freq: Once | INTRAVENOUS | Status: AC
  Administered 2016-11-30: 2 g via INTRAVENOUS
  Filled 2016-11-30: qty 50

## 2016-11-30 MED ORDER — POTASSIUM CHLORIDE CRYS ER 20 MEQ PO TBCR
40.0000 meq | EXTENDED_RELEASE_TABLET | ORAL | Status: AC
  Administered 2016-11-30 (×2): 40 meq via ORAL
  Filled 2016-11-30: qty 2

## 2016-11-30 MED ORDER — MAGNESIUM CHLORIDE 64 MG PO TBEC
1.0000 | DELAYED_RELEASE_TABLET | Freq: Every day | ORAL | Status: DC
  Administered 2016-11-30 – 2016-12-01 (×2): 64 mg via ORAL
  Filled 2016-11-30 (×3): qty 1

## 2016-11-30 MED ORDER — DILTIAZEM HCL ER COATED BEADS 240 MG PO CP24
240.0000 mg | ORAL_CAPSULE | Freq: Every day | ORAL | Status: DC
  Administered 2016-11-30: 240 mg via ORAL

## 2016-11-30 MED ORDER — IOPAMIDOL (ISOVUE-370) INJECTION 76%
INTRAVENOUS | Status: AC
  Administered 2016-11-30: 75 mL
  Filled 2016-11-30: qty 100

## 2016-11-30 NOTE — Progress Notes (Signed)
[facility] Progress Note  Patient: Michele Diaz CZY:[id number]   PCP: Walker Kehr, MD DOB: 1932/01/29   DOA: 11/28/2016   DOS: 11/30/2016   Date of Service: the patient was seen and examined on 11/30/2016  Subjective: Patient complains about right-sided scapular pain, also has increased shortness of breath, was hypotensive. No nausea no vomiting. Minimal oral intake.  Brief hospital course: Pt. with PMH of persistent A. fib, HTN, hypothyroidism, asthma, chronic diastolic CHF, GERD, autoimmune hepatitis on immunosuppressive medications, CVA, breast cancer S/P lumpectomy; admitted on 11/28/2016, with complaint of confusion as well as shortness of breath, was found to have acute encephalopathy as well as acute on chronic diastolic heart failure. Currently further plan is continue IV diuresis and further workup for encephalopathy.  Assessment and Plan: 1. Acute metabolic encephalopathy. Iatrogenic Hyperthyroidism Patient presented with complaints of confusion as well as hallucination. I suspect that this is multifactorial in the setting of hypoxia due to CHF exacerbation with use of psychotropic medications as well as iatrogenic hyperthyroidism. It appears that the patient is coming close to her baseline. Unremarkable CT head as well as MRI brain without contrast. UA was checked patient has asymptomatic bacteriuria, but UA has high epithelial cells as well as no nitrates and therefore I do not suspect that the patient has UTI. Normal U-93, start on folic acid supplementation. Thyroid dose reduced  2. Acute hypoxic respiratory failure. 85%  on room air currently requiring 4 L Acute on chronic diastolic CHF. Atrial fibrillation with RVR. Elevated troponin-demand ischemia. Asthma  Cardiology consulted, her 1 elevated troponin may not be accurate, EKG without any ischemic changes, patient was transitioned to therapeutic heparin from Xarelto but based on cardiology recommendation now  transition back to [facility] for anticoagulation for A. fib. Patient was started on Cardizem infusion but could not tolerate it and her blood pressure dropped. Currently heart rate is in the range of 110 to 120s. Cardiology-EP recommends resume home Cardizem dosing. Monitor Strict ins and outs, cardiac diet, daily weight. Echogram shows systolic dysfunction, will follow cardiac recommendation but currently changing Cardizem to Lopressor starting tomorrow  Patient has history of asthma and uses albuterol at home, I would use of Xopenex inhaler scheduled as well as Xopenex nebulizers as needed for wheezing.  3. Suspected PE. With tachycardia, hypoxia, right-sided pleuritic chest pain and presence of new pleural effusion despite adequate diuresis I'm concerned for a possible PE. We will get CT chest PE protocol to rule out pneumonia PE.  4. Hypothyroidism Iatrogenic hyperthyroidism. Recently her thyroid dose have been increased. Patient with weight loss, confusion, A. fib with RVR and CHF this admission. Patient was on 75 mcg of Synthroid, due to elevated TSH which was increased 200 g in December 2017. With free T4 of 2.37 I will reduce the Synthroid to 88 MCG. Expecting improvement in mentation, CHF as well as RVR.  5. Chronic anemia. Hb has been stable. No active bleeding. Resume iron supplementation.  6. Autoimmune hepatitis. Continue Imuran Monitor.   Bowel regimen: last BM 11/28/2016 Diet: Cardiac diet DVT Prophylaxis: Xarelto  Advance goals of care discussion: Full code  Family Communication: family was present at bedside, at the time of interview. The pt provided permission to discuss medical plan with the family. Opportunity was given to ask question and all questions were answered satisfactorily.   Disposition:  Discharge to SNF, social worker consulted. Expected discharge date: 12/02/2016, improvement in hypoxia as well as CHF  Consultants: Cardiology Procedures:  Echocardiogram-  Antibiotics: Anti-infectives  None     Objective: Physical Exam: Vitals:   11/30/16 0350 11/30/16 0355 11/30/16 1010 11/30/16 1057  BP: 102/81  (!) 86/54 100/72  Pulse: (!) 107     Resp:      Temp:  97.8 F (36.6 C)    TempSrc:  Oral    SpO2: 93%     Weight:  43.8 kg (96 lb 9.6 oz)    Height:        Intake/Output Summary (Last 24 hours) at 11/30/16 1159 Last data filed at 11/30/16 1100  Gross per 24 hour  Intake              240 ml  Output             2450 ml  Net            -2210 ml   Filed Weights   11/28/16 1544 11/30/16 0355  Weight: 49.2 kg (108 lb 6.4 oz) 43.8 kg (96 lb 9.6 oz)   General: Alert, Awake and Oriented to Time, Place and Person. Appear in moderate distress, affect appropriate Eyes: PERRL, Conjunctiva normal ENT: Oral Mucosa clear moist. Neck: positive JVD, no Abnormal Mass Or lumps Cardiovascular: S1 and S2 Present, aortic systolic Murmur, Respiratory: Bilateral Air entry equal and Decreased, decrease more on the right, no use of accessory muscle, basal Crackles, bilateral wheezes Abdomen: Bowel Sound present, Soft and no tenderness Skin: no redness, no Rash, no induration Extremities: bilateral Pedal edema, no calf tenderness Neurologic: Grossly no focal neuro deficit. Bilaterally Equal motor strength  Data Reviewed: CBC:  Recent Labs Lab 11/28/16 2118 11/29/16 0305 11/30/16 0526  WBC 6.7 8.3 6.3  NEUTROABS 5.2  --   --   HGB 9.4* 9.3* 9.2*  HCT 30.2* 30.3* 30.6*  MCV 81.0 80.2 81.2  PLT 258 251 960   Basic Metabolic Panel:  Recent Labs Lab 11/28/16 2118 11/29/16 0305 11/30/16 0526  NA 136 136 138  K 3.0* 4.4 3.1*  CL 103 101 97*  CO2 25 26 32  GLUCOSE 106* 106* 86  BUN 27* 29* 18  CREATININE 1.08* 1.05* 0.87  CALCIUM 8.8* 8.8* 8.5*  MG 2.1  --  1.5*    Liver Function Tests:  Recent Labs Lab 11/28/16 2118  AST 67*  ALT 23  ALKPHOS 64  BILITOT 2.2*  PROT 7.8  ALBUMIN 3.5   No results for  input(s): LIPASE, AMYLASE in the last 168 hours. No results for input(s): AMMONIA in the last 168 hours. Coagulation Profile:  Recent Labs Lab 11/28/16 2118  INR 1.60   Cardiac Enzymes:  Recent Labs Lab 11/28/16 2118 11/29/16 0305 11/29/16 0734 11/29/16 1815  TROPONINI 0.03* 1.42* 0.04* 0.03*   BNP (last 3 results) No results for input(s): PROBNP in the last 8760 hours. CBG: No results for input(s): GLUCAP in the last 168 hours. Studies: Mr Brain Wo Contrast  Result Date: 11/29/2016 CLINICAL DATA:  Acute encephalopathy. History of breast cancer and autoimmune hepatitis. EXAM: MRI HEAD WITHOUT CONTRAST TECHNIQUE: Multiplanar, multiecho pulse sequences of the brain and surrounding structures were obtained without intravenous contrast. COMPARISON:  Head CT 11/28/2016 and MRI 01/07/2013 FINDINGS: Brain: There is no evidence of acute infarct, mass, midline shift, or extra-axial fluid collection. Scattered chronic microhemorrhages are present throughout both cerebral hemispheres, predominantly in peripheral in location and more conspicuous than on the prior MRI which may reflect a true interval increase in number versus increased visibility from differences in technique. There is a chronic left  superior cerebellar infarct which is new from the prior MRI. Patchy T2 hyperintensities in the subcortical and deep cerebral white matter bilaterally have mildly progressed from the prior MRI and are nonspecific but compatible with moderate chronic small vessel ischemic disease. There is mild cerebral atrophy. Vascular: Major intracranial vascular flow voids are preserved. Skull and upper cervical spine: Unremarkable bone marrow signal. Sinuses/Orbits: Bilateral cataract extraction. Small left maxillary sinus mucous retention cyst. Minimal sphenoid sinus mucosal thickening. No significant mastoid fluid. Other: None. IMPRESSION: 1. No acute intracranial abnormality. 2. Moderate chronic small vessel ischemic  disease, progressed from 2014. 3. Chronic left cerebellar infarct. Electronically Signed   By: Logan Bores M.D.   On: 11/29/2016 16:52   Dg Chest Port 1 View  Result Date: 11/30/2016 CLINICAL DATA:  Shortness of breath. EXAM: PORTABLE CHEST 1 VIEW COMPARISON:  11/28/2016. FINDINGS: Stable enlarged cardiac silhouette. Decreased prominence of the pulmonary vasculature and interstitial markings. Increased density at the right lung base with decreased elevation of the right hemidiaphragm stable right apical pleural thickening and adjacent parenchymal scarring. Right pleural fluid and minimal left pleural fluid. Cervical spine degenerative changes. IMPRESSION: 1. Increased right basilar atelectasis or pneumonia and pleural fluid. 2. Minimal left pleural effusion. 3. Stable cardiomegaly. 4. Improved changes of congestive heart failure. 5. Aortic atherosclerosis. Electronically Signed   By: Claudie Revering M.D.   On: 11/30/2016 10:43    Scheduled Meds: . azaTHIOprine  50 mg Oral Daily  . feeding supplement (ENSURE ENLIVE)  237 mL Oral BID BM  . ferrous sulfate  325 mg Oral Q breakfast  . folic acid  1 mg Oral Daily  . levothyroxine  88 mcg Oral QAC breakfast  . magnesium chloride  1 tablet Oral Daily  . [START ON 12/01/2016] metoprolol tartrate  50 mg Oral BID  . polyethylene glycol  17 g Oral Daily  . Rivaroxaban  15 mg Oral Q supper  . sodium chloride flush  3 mL Intravenous Q12H   Continuous Infusions: PRN Meds: acetaminophen **OR** acetaminophen, levalbuterol, prochlorperazine  Time spent: 35 minutes  Author: Berle Mull, MD [facility] Pager: [phone] 11/30/2016 11:59 AM  If 7PM-7AM, please contact night-coverage at [email or web address], password [facility]

## 2016-11-30 NOTE — Progress Notes (Signed)
  Echocardiogram 2D Echocardiogram has been performed.  Johny Chess 11/30/2016, 8:45 AM

## 2016-11-30 NOTE — Progress Notes (Signed)
OT Cancellation Note  Patient Details Name: Michele Diaz MRN: [id number] DOB: 02/16/1932   Cancelled Treatment:    Reason Eval/Treat Not Completed: Other (comment). Pt just returned to room from CT and is now eating lunch. Will reattempt as able.   Tyrone Schimke OTR/L Pager: [phone]  11/30/2016, 1:36 PM

## 2016-11-30 NOTE — Progress Notes (Signed)
SUBJECTIVE: The patient is doing well today.  More alert today.  At this time, she denies chest pain, shortness of breath, or any new concerns.  Marland Kitchen azaTHIOprine  50 mg Oral Daily  . diltiazem  240 mg Oral Daily  . feeding supplement (ENSURE ENLIVE)  237 mL Oral BID BM  . ferrous sulfate  325 mg Oral Q breakfast  . folic acid  1 mg Oral Daily  . [START ON 12/01/2016] furosemide  40 mg Intravenous Daily  . levothyroxine  88 mcg Oral QAC breakfast  . magnesium sulfate 1 - 4 g bolus IVPB  2 g Intravenous Once  . metoprolol tartrate  25 mg Oral BID  . polyethylene glycol  17 g Oral Daily  . potassium chloride  40 mEq Oral Q2H  . Rivaroxaban  15 mg Oral Q supper  . sodium chloride flush  3 mL Intravenous Q12H     OBJECTIVE: Physical Exam: Vitals:   11/29/16 2039 11/29/16 2225 11/30/16 0350 11/30/16 0355  BP: (!) 99/56 (!) 101/49 102/81   Pulse: (!) 101 (!) 109 (!) 107   Resp: 20     Temp: 98.1 F (36.7 C)   97.8 F (36.6 C)  TempSrc: Oral   Oral  SpO2: 100% 100% 93%   Weight:    96 lb 9.6 oz (43.8 kg)  Height:        Intake/Output Summary (Last 24 hours) at 11/30/16 0914 Last data filed at 11/30/16 0300  Gross per 24 hour  Intake              120 ml  Output             3350 ml  Net            -3230 ml    Telemetry reveals afib, V rates 100-115 bpm  GEN- The patient is well appearing, alert and oriented x 3 today.   Head- normocephalic, atraumatic Eyes-  Sclera clear, conjunctiva pink Ears- hearing intact Oropharynx- clear Neck- supple,  Lungs- Clear to ausculation bilaterally, normal work of breathing Heart- iRRR,   GI- soft, NT, ND, + BS Extremities- no clubbing, cyanosis, +1 edema Skin- no rash or lesion Psych- euthymic mood, full affect Neuro- strength and sensation are intact  LABS: Basic Metabolic Panel:  Recent Labs  11/28/16 2118 11/29/16 0305 11/30/16 0526  NA 136 136 138  K 3.0* 4.4 3.1*  CL 103 101 97*  CO2 25 26 32  GLUCOSE 106* 106* 86    BUN 27* 29* 18  CREATININE 1.08* 1.05* 0.87  CALCIUM 8.8* 8.8* 8.5*  MG 2.1  --  1.5*   Liver Function Tests:  Recent Labs  11/28/16 2118  AST 67*  ALT 23  ALKPHOS 64  BILITOT 2.2*  PROT 7.8  ALBUMIN 3.5   No results for input(s): LIPASE, AMYLASE in the last 72 hours. CBC:  Recent Labs  11/28/16 2118 11/29/16 0305 11/30/16 0526  WBC 6.7 8.3 6.3  NEUTROABS 5.2  --   --   HGB 9.4* 9.3* 9.2*  HCT 30.2* 30.3* 30.6*  MCV 81.0 80.2 81.2  PLT 258 251 236   Cardiac Enzymes:  Recent Labs  11/29/16 0305 11/29/16 0734 11/29/16 1815  TROPONINI 1.42* 0.04* 0.03*   Thyroid Function Tests:  Recent Labs  11/28/16 2118  TSH 0.492   Anemia Panel:  Recent Labs  11/29/16 1815  VITAMINB12 1,077*     ASSESSMENT AND PLAN:   1. Persistent afib She  appears to have been in afib since at least November.  chads2vasc score is at least 6.  She has been treated with xarelto.  V rates are currently still elevated.  Will increase metoprolol to 50mg  BID.  Continue diltiazem CD.  2. Acute diastolic dysfunction Preserved EF by echo 5/17 2 gram sodium diet Strict Is and Os Continue IV lasix for now Better rate control will be helpful Echo is pending  3. Elevated troponin Denies chest pain,  Likely demand ischemia in nature due to RVR No ischemic ekg changes Will follow  4. htn bp is a little low Will follow   Thompson Grayer, MD 11/30/2016 9:14 AM

## 2016-11-30 NOTE — Progress Notes (Signed)
The patients is hypotensive this morning MD Posey Pronto was notified, having increased shortness of breath, some pain in her right upper back. Asked about morning medication and what to hold or give. Discussing with Allred from Cardiology and will come to unit to assess the patient. I will continue to monitor closely.  Saddie Benders RN

## 2016-12-01 DIAGNOSIS — I4891 Unspecified atrial fibrillation: Secondary | ICD-10-CM

## 2016-12-01 LAB — URINE CULTURE

## 2016-12-01 LAB — BASIC METABOLIC PANEL
Anion gap: 9 (ref 5–15)
BUN: 17 mg/dL (ref 6–20)
CHLORIDE: 98 mmol/L — AB (ref 101–111)
CO2: 30 mmol/L (ref 22–32)
CREATININE: 0.78 mg/dL (ref 0.44–1.00)
Calcium: 9 mg/dL (ref 8.9–10.3)
GFR calc Af Amer: >60 mL/min (ref >60)
GFR calc non Af Amer: >60 mL/min (ref >60)
Glucose, Bld: 115 mg/dL — ABNORMAL HIGH (ref 65–99)
Potassium: 4.4 mmol/L (ref 3.5–5.1)
SODIUM: 137 mmol/L (ref 135–145)

## 2016-12-01 LAB — CBC
HCT: 32.4 % — ABNORMAL LOW (ref 36.0–46.0)
HEMOGLOBIN: 9.5 g/dL — AB (ref 12.0–15.0)
MCH: 23.9 pg — ABNORMAL LOW (ref 26.0–34.0)
MCHC: 29.3 g/dL — AB (ref 30.0–36.0)
MCV: 81.6 fL (ref 78.0–100.0)
Platelets: 268 10*3/uL (ref 150–400)
RBC: 3.97 MIL/uL (ref 3.87–5.11)
RDW: 16.3 % — AB (ref 11.5–15.5)
WBC: 8.1 10*3/uL (ref 4.0–10.5)

## 2016-12-01 LAB — MAGNESIUM: MAGNESIUM: 2 mg/dL (ref 1.7–2.4)

## 2016-12-01 MED ORDER — DIGOXIN 0.25 MG/ML IJ SOLN: 0.2500 mg | Freq: Once | INTRAMUSCULAR | Status: DC

## 2016-12-01 MED ORDER — LORAZEPAM 2 MG/ML IJ SOLN
0.5000 mg | Freq: Once | INTRAMUSCULAR | Status: AC
  Administered 2016-12-01: 0.5 mg via INTRAVENOUS
  Filled 2016-12-01: qty 1

## 2016-12-01 MED ORDER — DIGOXIN 0.25 MG/ML IJ SOLN
0.2500 mg | Freq: Four times a day (QID) | INTRAMUSCULAR | Status: AC
  Administered 2016-12-01 (×2): 0.25 mg via INTRAVENOUS
  Filled 2016-12-01 (×2): qty 2

## 2016-12-01 MED ORDER — DIGOXIN 125 MCG PO TABS
0.1250 mg | ORAL_TABLET | Freq: Every day | ORAL | Status: DC
  Administered 2016-12-02 – 2016-12-03 (×2): 0.125 mg via ORAL
  Filled 2016-12-01 (×2): qty 1

## 2016-12-01 NOTE — Plan of Care (Signed)
Problem: Cardiac: Goal: Ability to achieve and maintain adequate cardiopulmonary perfusion will improve Outcome: Progressing Patient's HR is still elevated on Cardizem in the 100-110's but her B/P is still soft even after her 2 runs of Albumin, her BNP is coming down from 900's to 700's but was 300's about 10 months ago. She was on Lasix 40 MG IVP BID which is now on hold D/T soft B/P, will continue to monitor and speak with Cards about ? Milrinone drip since EF 35-40% not sure if that route would be possible at this time, will follow up with her assessment and continue to monitor her progress, so far she is fairly asymptomatic at this time.

## 2016-12-01 NOTE — Progress Notes (Signed)
Spoke with Dr Luan Pulling from Cardiology re: patient's elevated HR, will give her Metoprolol po but her HR has been between 110-168 not sustained and asymptomatic. He is not going to give her any more IV Digoxin at this time because of her age and Hypothyrhoidism, will text page [facility] for some mild anxiety medication so that she can sleep, will continue to monitor. B/P has been in the 110-120's/60-70's, will also watch that closely since it dipped yesterday am.

## 2016-12-01 NOTE — Progress Notes (Signed)
[facility] Progress Note  Patient: Michele Diaz VVO:[id number]   PCP: Walker Kehr, MD DOB: [birth year]   DOA: 11/28/2016   DOS: 12/01/2016   Date of Service: the patient was seen and examined on 12/01/2016  Subjective: Patient does not have any acute complaints but appears more confused today. Less lethargic although. Remain tachycardic overnight, required albumin boluses yesterday for hypotension.  Brief hospital course: Pt. with PMH of persistent A. fib, HTN, hypothyroidism, asthma, chronic diastolic CHF, GERD, autoimmune hepatitis on immunosuppressive medications, CVA, breast cancer S/P lumpectomy; admitted on 11/28/2016, with complaint of confusion as well as shortness of breath, was found to have acute encephalopathy as well as acute on chronic diastolic heart failure. Currently further plan is continue IV diuresis and further workup for encephalopathy.  Assessment and Plan: 1. Acute metabolic encephalopathy. Iatrogenic Hyperthyroidism Patient presented with complaints of confusion as well as hallucination. I suspect that this is multifactorial in the setting of hypoxia due to CHF exacerbation with use of psychotropic medications as well as iatrogenic hyperthyroidism. Patient's mentation has been waxing and waning. Unremarkable CT head as well as MRI brain without contrast. UA was checked patient has asymptomatic bacteriuria, but UA has high epithelial cells as well as no nitrates and therefore I do not suspect that the patient has UTI. Normal Y-69, start on folic acid supplementation. Thyroid dose reduced  2. Acute hypoxic respiratory failure. 85%  on room air currently requiring 4 L Acute on chronic diastolic CHF. Atrial fibrillation with RVR. Elevated troponin-demand ischemia. Asthma  Cardiology consulted, her 1 elevated troponin may not be accurate, EKG without any ischemic changes, patient was transitioned to therapeutic heparin from Xarelto but based on cardiology  recommendation now transition back to [place] for anticoagulation for A. fib. Patient was started on Cardizem infusion but could not tolerate it and her blood pressure dropped. Currently heart rate is in the range of 110 to 120s. Cardiology recommends to continue Lopressor today and add digoxin. Monitor Strict ins and outs, cardiac diet, daily weight. Echogram shows systolic dysfunction, will follow cardiac recommendation  Amiodarone not an option due to hyperthyroidism per cardiology.  Patient has history of asthma and uses albuterol at home, I would use of Xopenex inhaler scheduled as well as Xopenex nebulizers as needed for wheezing.  3. Bilateral pleural effusion. With tachycardia, hypoxia, right-sided pleuritic chest pain and presence of new pleural effusion despite adequate diuresis there was concerned for a possible PE. CT chest PE protocol performed, no evidence of pulmonary embolism, no evidence of pneumonia. Bilateral pleural effusion right more than left this is consistent with CHF and will continue IV Lasix at present. No indication for thoracentesis. Incentive spirometry, informed RN to provide the patient.  4. Hypothyroidism Iatrogenic hyperthyroidism. Recently her thyroid dose have been increased. Patient with weight loss, confusion, A. fib with RVR and CHF this admission. Patient was on 75 mcg of Synthroid, due to elevated TSH which was increased 200 g in December 2017. With free T4 of 2.37 I will reduce the Synthroid to 88 MCG. Expecting improvement in mentation, CHF as well as RVR slowly over a period of one week.  5. Chronic anemia. Hb has been stable. No active bleeding. Resume iron supplementation.  6. Autoimmune hepatitis. Continue Imuran Monitor.   Bowel regimen: last BM 11/30/2016 Diet: Cardiac diet DVT Prophylaxis: Xarelto  Advance goals of care discussion: Full code  Family Communication: family was present at bedside, at the time of interview. The pt  provided permission to discuss  medical plan with the family. Opportunity was given to ask question and all questions were answered satisfactorily.   Disposition:  Discharge to SNF, social worker consulted. Expected discharge date: 12/03/2016, improvement in heart rate as well as CHF  Consultants: Cardiology Procedures: Echocardiogram-  Antibiotics: Anti-infectives    None     Objective: Physical Exam: Vitals:   12/01/16 0345 12/01/16 1029 12/01/16 1100 12/01/16 1333  BP: (!) 121/95 100/68 112/85 116/73  Pulse: 76  70 (!) 112  Resp: 20     Temp: 97.7 F (36.5 C)   97.7 F (36.5 C)  TempSrc: Oral   Oral  SpO2: 97%  (!) 84% 98%  Weight: 43.5 kg (96 lb)     Height:        Intake/Output Summary (Last 24 hours) at 12/01/16 1554 Last data filed at 12/01/16 1414  Gross per 24 hour  Intake              370 ml  Output             2450 ml  Net            -2080 ml   Filed Weights   11/28/16 1544 11/30/16 0355 12/01/16 0345  Weight: 49.2 kg (108 lb 6.4 oz) 43.8 kg (96 lb 9.6 oz) 43.5 kg (96 lb)   General: Alert, Awake and Oriented to Time, Place and Person. Appear in moderate distress, affect appropriate Eyes: PERRL, Conjunctiva normal ENT: Oral Mucosa clear moist. Neck: positive JVD, no Abnormal Mass Or lumps Cardiovascular: S1 and S2 Present, aortic systolic Murmur, Respiratory: Bilateral Air entry equal and Decreased, decrease more on the right, no use of accessory muscle, basal Crackles, bilateral wheezes Abdomen: Bowel Sound present, Soft and no tenderness Skin: no redness, no Rash, no induration Extremities: bilateral Pedal edema, no calf tenderness Neurologic: Grossly no focal neuro deficit. Bilaterally Equal motor strength  Data Reviewed: CBC:  Recent Labs Lab 11/28/16 2118 11/29/16 0305 11/30/16 0526 11/30/16 1632 12/01/16 0325  WBC 6.7 8.3 6.3 7.0 8.1  NEUTROABS 5.2  --   --  5.6  --   HGB 9.4* 9.3* 9.2* 8.9* 9.5*  HCT 30.2* 30.3* 30.6* 29.3* 32.4*  MCV  81.0 80.2 81.2 82.8 81.6  PLT 258 251 236 277 295   Basic Metabolic Panel:  Recent Labs Lab 11/28/16 2118 11/29/16 0305 11/30/16 0526 12/01/16 0325  NA 136 136 138 137  K 3.0* 4.4 3.1* 4.4  CL 103 101 97* 98*  CO2 25 26 32 30  GLUCOSE 106* 106* 86 115*  BUN 27* 29* 18 17  CREATININE 1.08* 1.05* 0.87 0.78  CALCIUM 8.8* 8.8* 8.5* 9.0  MG 2.1  --  1.5* 2.0    Liver Function Tests:  Recent Labs Lab 11/28/16 2118  AST 67*  ALT 23  ALKPHOS 64  BILITOT 2.2*  PROT 7.8  ALBUMIN 3.5   No results for input(s): LIPASE, AMYLASE in the last 168 hours. No results for input(s): AMMONIA in the last 168 hours. Coagulation Profile:  Recent Labs Lab 11/28/16 2118  INR 1.60   Cardiac Enzymes:  Recent Labs Lab 11/28/16 2118 11/29/16 0305 11/29/16 0734 11/29/16 1815  TROPONINI 0.03* 1.42* 0.04* 0.03*   BNP (last 3 results) No results for input(s): PROBNP in the last 8760 hours. CBG: No results for input(s): GLUCAP in the last 168 hours. Studies: No results found.  Scheduled Meds: . azaTHIOprine  50 mg Oral Daily  . digoxin  0.25 mg Intravenous Q6H  . [  START ON 12/02/2016] digoxin  0.125 mg Oral Daily  . feeding supplement (ENSURE ENLIVE)  237 mL Oral BID BM  . ferrous sulfate  325 mg Oral Q breakfast  . folic acid  1 mg Oral Daily  . furosemide  40 mg Intravenous Daily  . levothyroxine  88 mcg Oral QAC breakfast  . magnesium chloride  1 tablet Oral Daily  . metoprolol tartrate  50 mg Oral BID  . polyethylene glycol  17 g Oral Daily  . Rivaroxaban  15 mg Oral Q supper  . sodium chloride flush  3 mL Intravenous Q12H   Continuous Infusions: PRN Meds: acetaminophen **OR** acetaminophen, levalbuterol, polyethylene glycol, prochlorperazine  Time spent: 35 minutes  Author: Berle Mull, MD [facility] Pager: [phone] 12/01/2016 3:54 PM  If 7PM-7AM, please contact night-coverage at [email or web address], password [facility]

## 2016-12-01 NOTE — Evaluation (Signed)
Occupational Therapy Evaluation Patient Details Name: Michele Diaz MRN: [id number] DOB: 02/19/1932 Today's Date: 12/01/2016    History of Present Illness Pt is a 83 y.o. female with a h/o persistent afib, HTN, and hypothyroidism who was admitted 11/28/16 with worsening confusion. She underwent cardioversion 5/17 but has been in afib since at least November per ekgs in epic; she has been treated with medication for rate control and anticoagulation. CT unremarkable. CXR 3/16 shows pulmonary vascular congestion, interstitial edema, and small bilateral pleural effusions. Elevated troponin 3/17, most likely demand ischemia in nature.     Clinical Impression   Pt.  Has increased confusion from [place]. Pt. Was oriented to persson and place. Pt. Was able to follow directions. Pt. Family wants pt. To d/c to snf for rehab.     Follow Up Recommendations  SNF    Equipment Recommendations  None recommended by OT    Recommendations for Other Services       Precautions / Restrictions Precautions Precautions: Fall Restrictions Weight Bearing Restrictions: No      Mobility Bed Mobility Overal bed mobility: Needs Assistance Bed Mobility: Supine to Sit     Supine to sit: Mod assist     General bed mobility comments: S to scoot to edge of bed.   Transfers     Transfers: Stand Pivot Transfers Sit to Stand: Min assist Stand pivot transfers: Min guard            Balance                                            ADL Overall ADL's : Needs assistance/impaired Eating/Feeding: Independent   Grooming: Wash/dry hands;Wash/dry face;Oral care;Min guard   Upper Body Bathing: Supervision/ safety;Set up;Sitting   Lower Body Bathing: Min guard;Sit to/from stand   Upper Body Dressing : Minimal assistance;Sitting   Lower Body Dressing: Minimal assistance;Sit to/from stand   Toilet Transfer: Minimal assistance;Ambulation   Toileting- Clothing Manipulation and  Hygiene: Minimal assistance;Sit to/from stand       Functional mobility during ADLs: Minimal assistance General ADL Comments: Min A for sit to stand and min guard assist for stand pivot transfer.      Vision Baseline Vision/History: Wears glasses;Cataracts Wears Glasses: Reading only Patient Visual Report: No change from baseline (B cataract sx)       Perception     Praxis      Pertinent Vitals/Pain Pain Assessment: No/denies pain     Hand Dominance Right   Extremity/Trunk Assessment Upper Extremity Assessment Upper Extremity Assessment: Generalized weakness           Communication Communication Communication: No difficulties   Cognition Arousal/Alertness: Awake/alert Behavior During Therapy: WFL for tasks assessed/performed Overall Cognitive Status: Impaired/Different from baseline Area of Impairment: Orientation;Memory;Safety/judgement Orientation Level: Time;Situation   Memory: Decreased short-term memory   Safety/Judgement: Decreased awareness of safety;Decreased awareness of deficits     General Comments:  (Pt. has increased confusion from baseline. )   General Comments       Exercises       Shoulder Instructions      Home Living Family/patient expects to be discharged to:: Skilled nursing facility Living Arrangements: Alone Available Help at Discharge: Family;Available PRN/intermittently Type of Home: House                       Home Equipment:  Cane - single point   Additional Comments:  (Pt. family want [facility] SNF then ALF )      Prior Functioning/Environment Level of Independence: Independent with assistive device(s)        Comments: Pt. granddaughter states that pt. has had 3 falls since the beginning of this year.         OT Problem List: Decreased strength;Decreased activity tolerance;Decreased safety awareness;Decreased knowledge of use of DME or AE      OT Treatment/Interventions:      OT Goals(Current goals can be  found in the care plan section) Acute Rehab OT Goals Patient Stated Goal: get out of here OT Goal Formulation: With patient Time For Goal Achievement: 12/15/16 Potential to Achieve Goals: Good ADL Goals Pt Will Perform Grooming: with modified independence;standing Pt Will Perform Upper Body Bathing: with modified independence;sitting Pt Will Perform Lower Body Bathing: with modified independence;sit to/from stand Pt Will Perform Upper Body Dressing: with modified independence;sitting Pt Will Perform Lower Body Dressing: with modified independence;sit to/from stand Pt Will Transfer to Toilet: with modified independence;regular height toilet;ambulating Pt Will Perform Toileting - Clothing Manipulation and hygiene: with modified independence;sit to/from stand  OT Frequency: Min 2X/week   Barriers to D/C: Decreased caregiver support  Pt. family wants snf then alf       Co-evaluation              End of Session Equipment Utilized During Treatment: Gait belt;Oxygen  Activity Tolerance: Patient tolerated treatment well Patient left: in chair;with call bell/phone within reach;with family/visitor present  OT Visit Diagnosis: Unsteadiness on feet (R26.81);Repeated falls (R29.6);Muscle weakness (generalized) (M62.81)                ADL either performed or assessed with clinical judgement  Time: 0930-1015 OT Time Calculation (min): 45 min Charges:  OT General Charges $OT Visit: 1 Procedure OT Evaluation $OT Eval Moderate Complexity: 1 Procedure OT Treatments $Self Care/Home Management : 8-22 mins G-Codes:     6 clicks 18, clinical judgement.  Mardell Cragg 12/01/2016, 10:16 AM

## 2016-12-01 NOTE — Progress Notes (Signed)
SUBJECTIVE: Pt denies CP or dyspnea  . azaTHIOprine  50 mg Oral Daily  . feeding supplement (ENSURE ENLIVE)  237 mL Oral BID BM  . ferrous sulfate  325 mg Oral Q breakfast  . folic acid  1 mg Oral Daily  . furosemide  40 mg Intravenous Daily  . levothyroxine  88 mcg Oral QAC breakfast  . magnesium chloride  1 tablet Oral Daily  . metoprolol tartrate  50 mg Oral BID  . polyethylene glycol  17 g Oral Daily  . Rivaroxaban  15 mg Oral Q supper  . sodium chloride flush  3 mL Intravenous Q12H     OBJECTIVE: Physical Exam: Vitals:   11/30/16 1633 11/30/16 1701 11/30/16 2100 12/01/16 0345  BP: 90/61 90/67 111/70 (!) 121/95  Pulse:   88 76  Resp:   18 20  Temp:    97.7 F (36.5 C)  TempSrc:    Oral  SpO2:   96% 97%  Weight:    96 lb (43.5 kg)  Height:        Intake/Output Summary (Last 24 hours) at 12/01/16 0836 Last data filed at 12/01/16 0340  Gross per 24 hour  Intake              490 ml  Output             1300 ml  Net             -810 ml    Telemetry reveals afib, V rates 100-115 bpm  GEN- The patient is frail, well appearing, NAD Head- normal Neck- supple Lungs- Mildly diminished BS bases Heart- irregular GI- soft, NT, ND, + BS Extremities- no edema Neuro- grossly intact  LABS: Basic Metabolic Panel:  Recent Labs  11/30/16 0526 12/01/16 0325  NA 138 137  K 3.1* 4.4  CL 97* 98*  CO2 32 30  GLUCOSE 86 115*  BUN 18 17  CREATININE 0.87 0.78  CALCIUM 8.5* 9.0  MG 1.5* 2.0   Liver Function Tests:  Recent Labs  11/28/16 2118  AST 67*  ALT 23  ALKPHOS 64  BILITOT 2.2*  PROT 7.8  ALBUMIN 3.5   CBC:  Recent Labs  11/28/16 2118  11/30/16 1632 12/01/16 0325  WBC 6.7  < > 7.0 8.1  NEUTROABS 5.2  --  5.6  --   HGB 9.4*  < > 8.9* 9.5*  HCT 30.2*  < > 29.3* 32.4*  MCV 81.0  < > 82.8 81.6  PLT 258  < > 277 268  < > = values in this interval not displayed. Cardiac Enzymes:  Recent Labs  11/29/16 0305 11/29/16 0734 11/29/16 1815    TROPONINI 1.42* 0.04* 0.03*   Thyroid Function Tests:  Recent Labs  11/28/16 2118  TSH 0.492   Anemia Panel:  Recent Labs  11/29/16 1815  VITAMINB12 1,077*     ASSESSMENT AND PLAN:   1. Persistent afib She appears to have been in afib since at least November. Chads2vasc score is at least 6.  She has been treated with xarelto.  V rates are currently still elevated. Continue metoprolol. Add digoxin as BP borderline. Would like to avoid amiodarone given elevated T4 and thyroid issues. Some of tachycardia likely driven by iatrogenic hyperthyroidism.  2. Acute diastolic dysfunction Preserved EF by echo 5/17 2 gram sodium diet Strict Is and Os Continue IV lasix for now  3. Elevated troponin Denies chest pain,  Likely demand ischemia in nature due to  RVR No ischemic ekg changes; no plans for further ischemia eval at this point  4. Hypertension BP low; follow closely  5. Cardiomyopathy This is possibly secondary to tachycardia. Continue beta blocker. Add digoxin as outlined above. Blood pressure will not allow an ACE inhibitor. We'll plan to repeat echocardiogram in 3 months once tachycardia is controlled. Could pursue further ischemia evaluation at that point if LV function remains reduced.  6. Iatrogenic hyperthyroidism Needs adjustment of Synthroid dose as this is likely driving tachycardia.   Kirk Ruths, MD 12/01/2016 8:36 AM

## 2016-12-01 NOTE — Progress Notes (Signed)
PT Cancellation Note  Patient Details Name: MIKKI ZIFF MRN: [id number] DOB: 1932/01/23   Cancelled Treatment:    Reason Eval/Treat Not Completed: Other (comment) - pt declined mobility with PT having just received lunch. Will follow up as time allows.  Enis Gash, SPT Office-[phone]  [name] 12/01/2016, 11:58 AM

## 2016-12-01 NOTE — [place] FL2 (Signed)
[place] MEDICAID FL2 LEVEL OF CARE SCREENING TOOL     IDENTIFICATION  Patient Name: Michele Diaz Birthdate: March 10, 1932 Sex: female Admission Date (Current Location): 11/28/2016  [facility] and [place] Number:  Herbalist and Address:  [facility], [place] [address] [place], [place] [address]      Provider Number: [id number]  Attending Physician Name and Address:  Lavina Hamman, MD  Relative Name and Phone Number:       Current Level of Care: Hospital Recommended Level of Care: [place] Prior Approval Number:    Date Approved/Denied:   PASRR Number:   [id number]   Discharge Plan: SNF    Current Diagnoses: Patient Active Problem List   Diagnosis Date Noted  . Acute metabolic encephalopathy 40/06/2724  . Acute on chronic diastolic heart failure ([place]) 11/29/2016  . CHF (congestive heart failure) ([place]) 11/28/2016  . Confusion 11/28/2016  . Acute on chronic diastolic CHF (congestive heart failure) ([place]) 11/28/2016  . Autoimmune hepatitis ([place]) 11/28/2016  . Contusion of multiple sites of right shoulder 08/27/2016  . Pleural effusion 02/13/2016  . Positive D dimer 02/13/2016  . Anemia 02/13/2016  . H/O: CVA (cerebrovascular accident) 02/13/2016  . Prolonged Q-T interval on ECG 02/03/2016  . Atrial fibrillation with rapid ventricular response ([place]) 02/03/2016  . Cataract 03/20/2014  . Epistaxis 08/22/2013  . Wart viral 04/24/2013  . Actinic keratoses 04/22/2013  . Stroke ([place]) 01/07/2013  . Well adult exam 06/01/2012  . Headache(784.0) 03/02/2012  . Grief reaction 09/19/2011  . Memory deficit 05/26/2011  . Chest pain 04/24/2011  . Chronic hyponatremia 07/16/2010  . DEPRESSION/ANXIETY 04/03/2010  . Fatigue 03/06/2010  . WEIGHT LOSS 03/06/2010  . VERTIGO 10/23/2009  . LIVER FUNCTION TESTS, ABNORMAL, HX OF 08/17/2009  . CHEST WALL PAIN 04/06/2008  . GERD 10/15/2007  . Hypothyroidism, acquired 07/19/2007  .  Essential hypertension 07/19/2007  . ALLERGIC RHINITIS 07/19/2007  . DIVERTICULOSIS, COLON 07/19/2007  . OSTEOARTHRITIS 07/19/2007  . BREAST CANCER, HX OF 07/19/2007  . VITAMIN D DEFICIENCY 06/09/2007  . Polymyalgia rheumatica ([place]) 06/09/2007    Orientation RESPIRATION BLADDER Height & Weight     Self, Time, Place  O2 (4L [place]) Continent Weight: 96 lb (43.5 kg) Height:  5\' 2"  (157.5 cm)  BEHAVIORAL SYMPTOMS/MOOD NEUROLOGICAL BOWEL NUTRITION STATUS      Continent Diet (cardiac)  AMBULATORY STATUS COMMUNICATION OF NEEDS Skin   Limited Assist Verbally Normal                       Personal Care Assistance Level of Assistance  Bathing, Dressing Bathing Assistance: Limited assistance   Dressing Assistance: Limited assistance     Functional Limitations Info             SPECIAL CARE FACTORS FREQUENCY  PT (By licensed PT), OT (By licensed OT)     PT Frequency: 5/wk OT Frequency: 5/wk            Contractures      Additional Factors Info  Code Status, Allergies Code Status Info: FULL Allergies Info: Atorvastatin, Celecoxib, Rosuvastatin, Codeine, Penicillins           Current Medications (12/01/2016):  This is the current hospital active medication list Current Facility-Administered Medications  Medication Dose Route Frequency Provider Last Rate Last Dose  . acetaminophen (TYLENOL) tablet 650 mg  650 mg Oral Q6H PRN Edwin Dada, MD       Or  .  acetaminophen (TYLENOL) suppository 650 mg  650 mg Rectal Q6H PRN Edwin Dada, MD      . azaTHIOprine (IMURAN) tablet 50 mg  50 mg Oral Daily Edwin Dada, MD   50 mg at 12/01/16 0834  . digoxin (LANOXIN) 0.25 MG/ML injection 0.25 mg  0.25 mg Intravenous Q6H Lelon Perla, MD   0.25 mg at 12/01/16 1020  . [START ON 12/02/2016] digoxin (LANOXIN) tablet 0.125 mg  0.125 mg Oral Daily Lelon Perla, MD      . feeding supplement (ENSURE ENLIVE) (ENSURE ENLIVE) liquid 237 mL  237 mL Oral BID BM  Lavina Hamman, MD   237 mL at 12/01/16 1000  . ferrous sulfate tablet 325 mg  325 mg Oral Q breakfast Lavina Hamman, MD   325 mg at 12/01/16 0834  . folic acid (FOLVITE) tablet 1 mg  1 mg Oral Daily Lavina Hamman, MD   1 mg at 12/01/16 0834  . furosemide (LASIX) injection 40 mg  40 mg Intravenous Daily Lavina Hamman, MD   Stopped at 12/01/16 1000  . levalbuterol (XOPENEX) nebulizer solution 0.63 mg  0.63 mg Nebulization Q6H PRN Lavina Hamman, MD   0.63 mg at 11/29/16 1531  . levothyroxine (SYNTHROID, LEVOTHROID) tablet 88 mcg  88 mcg Oral QAC breakfast Lavina Hamman, MD   88 mcg at 12/01/16 0656  . magnesium chloride (SLOW-MAG) 64 MG SR tablet 64 mg  1 tablet Oral Daily Lavina Hamman, MD   64 mg at 12/01/16 0834  . metoprolol (LOPRESSOR) tablet 50 mg  50 mg Oral BID Lavina Hamman, MD   Stopped at 12/01/16 [phone]  . polyethylene glycol (MIRALAX / GLYCOLAX) packet 17 g  17 g Oral Daily Lavina Hamman, MD   17 g at 12/01/16 0837  . polyethylene glycol (MIRALAX / GLYCOLAX) packet 17 g  17 g Oral Daily PRN Lavina Hamman, MD      . prochlorperazine (COMPAZINE) injection 5 mg  5 mg Intravenous Q6H PRN Ritta Slot, NP   5 mg at 11/28/16 2226  . Rivaroxaban (XARELTO) tablet 15 mg  15 mg Oral Q supper Lavina Hamman, MD   15 mg at 11/30/16 1838  . sodium chloride flush (NS) 0.9 % injection 3 mL  3 mL Intravenous Q12H Edwin Dada, MD   3 mL at 11/30/16 2244     Discharge Medications: Please see discharge summary for a list of discharge medications.  Relevant Imaging Results:  Relevant Lab Results:   Additional Information SSN [id number]  Jorge Ny, LCSW

## 2016-12-01 NOTE — Plan of Care (Signed)
Problem: Bowel/Gastric: Goal: Will not experience complications related to bowel motility Outcome: Progressing Patient was having some slight constipation issues, Miralax and Colace was ordered and patient's bowel sounds are becoming more active, will continue to monitor.

## 2016-12-01 NOTE — Clinical Social Work Note (Signed)
Clinical Social Work Assessment  Patient Details  Name: Michele Diaz MRN: [id number] Date of Birth: 22-Mar-1932  Date of referral:  12/01/16               Reason for consult:  Facility Placement                Permission sought to share information with:    Permission granted to share information::  Yes, Verbal Permission Granted  Name::        Agency::  SNF  Relationship::  granddaughter  Contact Information:     Housing/Transportation Living arrangements for the past 2 months:  Apartment Source of Information:  Patient Patient Interpreter Needed:  None Criminal Activity/Legal Involvement Pertinent to Current Situation/Hospitalization:  No - Comment as needed Significant Relationships:  Adult Children, Other Family Members Lives with:  Self Do you feel safe going back to the place where you live?  No Need for family participation in patient care:  No (Coment)  Care giving concerns:  Pt lives in apartment alone and now much weaker- pt has been helping care for her sister who recently had a stroke.  Family and pt does not think pt is safe to continue living alone with her current weakness.   Social Worker assessment / plan:  CSW spoke with pt and pt granddaughter at bedside concerning recommendation for SNF.  Pt reports having previous negative experience with SNF and is wary about going to one.    Pt had been looking into IDL at [facility] and is interested in getting there to IDL or ALF if possible- CSW explained barriers to direct placement into IDL or ALF but will follow up in case.  Employment status:  Retired Forensic scientist:  Medicare PT Recommendations:  [place] / Referral to community resources:  [place]  Patient/Family's Response to care:  Pt is agreeable to looking at other SNF options but hopeful for transfer to [place] place.  Patient/Family's Understanding of and Emotional Response to Diagnosis, Current  Treatment, and Prognosis:  Expressed good understanding of her current situation and is realistic about need to be at facility short term- hopeful that facility placement can lead to IDL of ALF placement where her sister can also be placed.  Emotional Assessment Appearance:  Appears stated age Attitude/Demeanor/Rapport:    Affect (typically observed):  Appropriate Orientation:  Oriented to Situation, Oriented to  Time, Oriented to Place, Oriented to Self Alcohol / Substance use:  Not Applicable Psych involvement (Current and /or in the community):  No (Comment)  Discharge Needs  Concerns to be addressed:  Care Coordination Readmission within the last 30 days:  No Current discharge risk:  Physical Impairment Barriers to Discharge:  Continued Medical Work up   Jorge Ny, LCSW 12/01/2016, 12:02 PM

## 2016-12-01 NOTE — Progress Notes (Signed)
Physical Therapy Treatment Patient Details Name: Michele Diaz MRN: [id number] DOB: 1932-08-20 Today's Date: 12/01/2016    History of Present Illness Pt is a 83 y.o. female with a h/o persistent afib, HTN, and hypothyroidism who was admitted 11/28/16 with worsening confusion. She underwent cardioversion 5/17 but has been in afib since at least November per ekgs in epic; she has been treated with medication for rate control and anticoagulation. CT unremarkable. CXR 3/16 shows pulmonary vascular congestion, interstitial edema, and small bilateral pleural effusions. Elevated troponin 3/17, most likely demand ischemia in nature.      PT Comments    Pt progressing with mobility, able to amb in hallway with RW and minA for balance; improved stability with use of RW. HR 120s-160s with mobility. Pt demonstrating improved orientation and awareness today, but continues to display decreased memory and problem solving. Will continue to follow acutely.   Follow Up Recommendations  SNF;Supervision/Assistance - 24 hour     Equipment Recommendations       Recommendations for Other Services       Precautions / Restrictions Precautions Precautions: Fall Precaution Comments: Watch HR Restrictions Weight Bearing Restrictions: No    Mobility  Bed Mobility               General bed mobility comments: Pt received sitting in chair  Transfers Overall transfer level: Needs assistance Equipment used: Rolling walker (2 wheeled) Transfers: Sit to/from Stand Sit to Stand: Min assist         General transfer comment: Able to stand on second attempt with minA and RW; verbal cues for hand placement on RW.   Ambulation/Gait Ambulation/Gait assistance: Min assist Ambulation Distance (Feet): 100 Feet Assistive device: Rolling walker (2 wheeled) Gait Pattern/deviations: Step-through pattern;Decreased stride length Gait velocity: Decreased Gait velocity interpretation: Below normal speed for  age/gender General Gait Details: Slowed gait with minA and RW; stability improved with RW. 1x episode self-corrected LOB. Pt c/o of BLE weakness and fatigue with amb.    Stairs            Wheelchair Mobility    Modified Rankin (Stroke Patients Only)       Balance Overall balance assessment: Needs assistance Sitting-balance support: No upper extremity supported;Feet supported Sitting balance-Leahy Scale: Fair     Standing balance support: Bilateral upper extremity supported;During functional activity Standing balance-Leahy Scale: Poor Standing balance comment: Reliant on UE support to maintain balance.                    Cognition Arousal/Alertness: Awake/alert Behavior During Therapy: WFL for tasks assessed/performed Overall Cognitive Status: Impaired/Different from baseline Area of Impairment: Orientation;Awareness;Memory;Safety/judgement;Problem solving Orientation Level: Disoriented to;Time (Cues for day) Current Attention Level: Sustained Memory: Decreased short-term memory   Safety/Judgement: Decreased awareness of safety;Decreased awareness of deficits Awareness: Emergent Problem Solving: Requires verbal cues;Slow processing General Comments: Improvement in orientation and awareness since last treatment session.    Exercises      General Comments General comments (skin integrity, edema, etc.): HR up to 162 with amb.       Pertinent Vitals/Pain Pain Assessment: No/denies pain    Home Living                      Prior Function            PT Goals (current goals can now be found in the care plan section) Progress towards PT goals: Progressing toward goals    Frequency  Min 2X/week      PT Plan Current plan remains appropriate    Co-evaluation             End of Session Equipment Utilized During Treatment: Gait belt Activity Tolerance: Patient tolerated treatment well Patient left: in chair;with family/visitor  present;with call bell/phone within reach Nurse Communication: Mobility status PT Visit Diagnosis: Unsteadiness on feet (R26.81);Muscle weakness (generalized) (M62.81);Repeated falls (R29.6)     Time: 6712-4580 PT Time Calculation (min) (ACUTE ONLY): 17 min  Charges:  $Gait Training: 8-22 mins                    G Codes:      Michele Diaz, SPT Office-[phone]  Michele Diaz 12/01/2016, 4:17 PM

## 2016-12-01 NOTE — Progress Notes (Addendum)
Nutrition Follow-up  DOCUMENTATION CODES:   Underweight  INTERVENTION:  Continue Ensure Enlive po BID, each supplement provides 350 kcal and 20 grams of protein.   Encourage PO intake. NUTRITION DIAGNOSIS:   Increased nutrient needs related to chronic illness as evidenced by estimated needs.  Ongoing  GOAL:   Patient will meet greater than or equal to 90% of their needs  Progressing  MONITOR:   PO intake, Supplement acceptance, Labs, I & O's  REASON FOR ASSESSMENT:   Consult Assessment of nutrition requirement/status  ASSESSMENT:   83 y/o female PMHx HTN, HFpEF, HLD, CVA, Afib, Anxiety, Depression, autoimmune hepatitis. Presented with subacute confusion. Reported fall 4-5 weeks ago. Endorses SOB and leg swelling. Admitted for workup of etiology of confusion.   Patients granddaughter in room at time of consult. (Daughter is in the medical field). Per granddaughter, patient had lost 12 lb over the past 3 days from fluid. (This is an 11% weight loss, which is significant for this time frame). She reported patient "eats like a bird" and that today she had consumed 1/2 oatmeal, 1 Chicken mini brought in from [place], 1/2 an Ensure for breakfast, a couple bites of chicken noodle soup and another chicken mini for lunch. Patient reported no problems with chewing or swallowing.  Encouraged patient to continue PO intake and drinking Ensure as much as possible. Granddaughter reported she continues to monitor patient and ensure she is eating, even when the patient does not want to eat.  Meds Reviewed: Ferrous sulfate, folvite, lasix, synthroid, Magnesium chloride, Miralax, Xarelto  Wt Readings from Last 10 Encounters:  12/01/16 96 lb (43.5 kg)  11/28/16 108 lb (49 kg)  08/27/16 110 lb (49.9 kg)  08/18/16 111 lb (50.3 kg)  08/04/16 112 lb (50.8 kg)  08/01/16 112 lb (50.8 kg)  05/27/16 113 lb (51.3 kg)  02/29/16 110 lb (49.9 kg)  02/14/16 108 lb 9.6 oz (49.3 kg)  02/12/16 111  lb 9.6 oz (50.6 kg)   Labs Reviewed: Chloride 98 (L), Glucose 115 (H)  Diet Order:  Diet Heart Room service appropriate? Yes; Fluid consistency: Thin  Skin:  Reviewed, no issues  Last BM:  3/18  Height:   Ht Readings from Last 1 Encounters:  11/28/16 5\' 2"  (1.575 m)    Weight:   Wt Readings from Last 1 Encounters:  12/01/16 96 lb (43.5 kg)    Ideal Body Weight:  50 kg  BMI:  Body mass index is 17.56 kg/m.  Estimated Nutritional Needs:   Kcal:  1350-1550 kcals (28-32 kcal/kg bw)  Protein:  55-65 pro (1.1-1.3 g/kg bw)  Fluid:  1.4-1.6 L fluid (73ml/kcal)  EDUCATION NEEDS:   No education needs identified at this time  Juliann Pulse M.S. Nutrition Dietetic Intern   Chart reviewed. I agree with student dietitian note. Appropriate revisions have been made.  Molli Barrows, RD, LDN, [place] Pager# [phone] After Hours Pager# [phone]

## 2016-12-02 ENCOUNTER — Other Ambulatory Visit: Payer: Self-pay | Admitting: Internal Medicine

## 2016-12-02 LAB — CBC
HCT: 35.5 % — ABNORMAL LOW (ref 36.0–46.0)
Hemoglobin: 10.3 g/dL — ABNORMAL LOW (ref 12.0–15.0)
MCH: 24.1 pg — AB (ref 26.0–34.0)
MCHC: 29 g/dL — AB (ref 30.0–36.0)
MCV: 82.9 fL (ref 78.0–100.0)
PLATELETS: 291 10*3/uL (ref 150–400)
RBC: 4.28 MIL/uL (ref 3.87–5.11)
RDW: 16.3 % — AB (ref 11.5–15.5)
WBC: 7.9 10*3/uL (ref 4.0–10.5)

## 2016-12-02 LAB — BASIC METABOLIC PANEL
Anion gap: 7 (ref 5–15)
BUN: 17 mg/dL (ref 6–20)
CALCIUM: 9.4 mg/dL (ref 8.9–10.3)
CO2: 33 mmol/L — ABNORMAL HIGH (ref 22–32)
Chloride: 96 mmol/L — ABNORMAL LOW (ref 101–111)
Creatinine, Ser: 0.81 mg/dL (ref 0.44–1.00)
GFR calc Af Amer: >60 mL/min (ref >60)
Glucose, Bld: 90 mg/dL (ref 65–99)
Potassium: 4.4 mmol/L (ref 3.5–5.1)
SODIUM: 136 mmol/L (ref 135–145)

## 2016-12-02 MED ORDER — SENNOSIDES-DOCUSATE SODIUM 8.6-50 MG PO TABS
1.0000 | ORAL_TABLET | Freq: Two times a day (BID) | ORAL | Status: DC
  Administered 2016-12-02 – 2016-12-03 (×2): 1 via ORAL
  Filled 2016-12-02 (×3): qty 1

## 2016-12-02 MED ORDER — LEVOTHYROXINE SODIUM 75 MCG PO TABS
75.0000 ug | ORAL_TABLET | Freq: Every day | ORAL | Status: DC
  Administered 2016-12-03 – 2016-12-04 (×2): 75 ug via ORAL
  Filled 2016-12-02 (×2): qty 1

## 2016-12-02 MED ORDER — DILTIAZEM HCL 30 MG PO TABS
15.0000 mg | ORAL_TABLET | Freq: Four times a day (QID) | ORAL | Status: DC
Start: 2016-12-02 — End: 2016-12-03
  Administered 2016-12-03: 15 mg via ORAL
  Filled 2016-12-02: qty 1

## 2016-12-02 MED ORDER — FUROSEMIDE 40 MG PO TABS
40.0000 mg | ORAL_TABLET | Freq: Every day | ORAL | Status: DC
  Administered 2016-12-02 – 2016-12-04 (×3): 40 mg via ORAL
  Filled 2016-12-02 (×3): qty 1

## 2016-12-02 MED ORDER — QUETIAPINE FUMARATE 25 MG PO TABS
12.5000 mg | ORAL_TABLET | Freq: Every day | ORAL | Status: DC
  Administered 2016-12-02: 12.5 mg via ORAL
  Filled 2016-12-02: qty 1

## 2016-12-02 NOTE — Progress Notes (Signed)
Patient is sleeping at this time with no s/s of distress/discomfort, VS fairly stable with HR 90-120 and B/P WNL, will continue to monitor. Dr Luan Pulling was talking about cardioversion but will wait till morning when seen by her Cardiology group.

## 2016-12-02 NOTE — Consult Note (Signed)
           [facility] CM Primary Care Navigator  12/02/2016  Michele Diaz 10-29-1931 [id number]   Went to see patient at the bedside to identify possible discharge needs but she is asleep. No family present at bedside.  Per inpatient social worker note, recommendation for discharge is skilled nursing facility (SNF) since she lives alone in an apartment and had been helping with care for her sister who recently had a stroke.  Family does not think that patient is safe to continue living alone with her current weakness.  Patient had expressed understanding of need for skilled nursing facility at discharge and hopeful that facility placement can lead to independent living or ALF placement (prefers [facility]) where her sister can also be placed. Inpatient social worker is currently assisting patient with placement.  For questions, please contact:  Dannielle Huh, BSN, RN- [facility] Primary Care Navigator  Telephone: [phone] [place]

## 2016-12-02 NOTE — Progress Notes (Signed)
   12/02/16 1438  Clinical Encounter Type  Visited With Patient and family together  Visit Type Initial  Referral From Family  Consult/Referral To Chaplain  Spiritual Encounters  Spiritual Needs Literature;Brochure  Stress Factors  Patient Stress Factors Lack of knowledge  Pt is not able to clearly understand how to complete an Advance Directive.  Will follow up if needed.  Chaplain Umer Harig A. Sequoyah, [phone]

## 2016-12-02 NOTE — Progress Notes (Addendum)
[facility] Progress Note  Patient: Michele Diaz EGB:[id number]   PCP: Walker Kehr, MD DOB: 09/06/1932   DOA: 11/28/2016   DOS: 12/02/2016   Date of Service: the patient was seen and examined on 12/02/2016  Subjective: Apparently patient was given 0.5 mg of IV Ativan last night for agitation. Patient was drowsy and lethargic on my initial evaluation. On repeat evaluation patient was alert awake and oriented. Denies any acute complaint.  Brief hospital course: Pt. with PMH of persistent A. fib, HTN, hypothyroidism, asthma, chronic diastolic CHF, GERD, autoimmune hepatitis on immunosuppressive medications, CVA, breast cancer S/P lumpectomy; admitted on 11/28/2016, with complaint of confusion as well as shortness of breath, was found to have acute encephalopathy as well as acute on chronic diastolic heart failure. Currently further plan is continue IV diuresis and Attempt for rate control less than 100.  Assessment and Plan: 1. Acute metabolic encephalopathy. Iatrogenic Hyperthyroidism Patient presented with complaints of confusion as well as hallucination. I suspect that this is multifactorial in the setting of hypoxia due to CHF exacerbation with use of psychotropic medications as well as iatrogenic hyperthyroidism. Patient's mentation has been waxing and waning. Unremarkable CT head as well as MRI brain without contrast. UA was checked patient has asymptomatic bacteriuria, but UA has high epithelial cells as well as no nitrates and therefore I do not suspect that the patient has UTI. Normal P-71, start on folic acid supplementation. Thyroid dose reduced initially to 88 MCG and repeat to 75 MCG. Today the patient is significantly drowsy and lethargic likely secondary to Ativan, I would initiate the patient on low-dose Seroquel for agitation. Avoid IV or oral benzodiazepine.  2. Acute hypoxic respiratory failure. 85%  on room air currently requiring 4 L Acute on chronic diastolic CHF.  Acute Systolic CHF Atrial fibrillation with RVR. Elevated troponin-demand ischemia. Asthma  Cardiology consulted, her 1 elevated troponin may not be accurate, EKG without any ischemic changes, patient was transitioned to therapeutic heparin from Xarelto but based on cardiology recommendation now transition back to [place] for anticoagulation for A. fib. Patient was started on Cardizem infusion but could not tolerate it and her blood pressure dropped. heart rate persistently is in the range of 110 to 120s. Cardiology recommends to continue Lopressor and digoxin. With recommendation is to reintroduce low-dose Cardizem. Monitor Strict ins and outs, cardiac diet, daily weight. Echogram shows new systolic dysfunction, no ischemic workup recommended by cardiology at this time. Outpatient follow-up echocardiogram recommended. Amiodarone not an option due to hyperthyroidism per cardiology.  Patient has history of asthma and uses albuterol at home, I would use of Xopenex inhaler scheduled as well as Xopenex nebulizers as needed for wheezing.  3. Bilateral pleural effusion. With tachycardia, hypoxia, right-sided pleuritic chest pain and presence of new pleural effusion despite adequate diuresis there was concerned for a possible PE. CT chest PE protocol performed, no evidence of pulmonary embolism, no evidence of pneumonia. Bilateral pleural effusion right more than left this is consistent with CHF and will continue IV Lasix at present. No indication for thoracentesis. Incentive spirometry, informed RN to provide the patient. Etc. recheck chest x-ray tomorrow, currently able to tolerate room air  4. Hypothyroidism Iatrogenic hyperthyroidism. Recently her thyroid dose have been increased. Patient with weight loss, confusion, A. fib with RVR and CHF this admission. Patient was on 75 mcg of Synthroid, due to elevated TSH which was increased 200 g in December 2017. With free T4 of 2.37 I will reduce the  Synthroid to 88  MCG. Expecting improvement in mentation, CHF as well as RVR slowly over a period of one week.  5. Chronic anemia. Hb has been stable. No active bleeding. Resume iron supplementation.  6. Autoimmune hepatitis. Continue Imuran Monitor.   Bowel regimen: last BM 11/30/2016 Diet: Cardiac diet DVT Prophylaxis: Xarelto  Advance goals of care discussion: Full code  Family Communication: family was present at bedside, at the time of interview. The pt provided permission to discuss medical plan with the family. Opportunity was given to ask question and all questions were answered satisfactorily.   Disposition:  Discharge to SNF, social worker consulted. Expected discharge date: 12/03/2016, improvement in heart rate as well as CHF  Consultants: Cardiology Procedures: Echocardiogram-  Antibiotics: Anti-infectives    None     Objective: Physical Exam: Vitals:   12/02/16 0105 12/02/16 0205 12/02/16 0730 12/02/16 1405  BP: 92/68 95/65 126/87 116/72  Pulse: (!) 49 80 64 (!) 104  Resp:    16  Temp:    97.7 F (36.5 C)  TempSrc:    Oral  SpO2: 99% 100% 100% 95%  Weight:      Height:        Intake/Output Summary (Last 24 hours) at 12/02/16 1718 Last data filed at 12/01/16 2000  Gross per 24 hour  Intake              480 ml  Output              750 ml  Net             -270 ml   Filed Weights   11/28/16 1544 11/30/16 0355 12/01/16 0345  Weight: 49.2 kg (108 lb 6.4 oz) 43.8 kg (96 lb 9.6 oz) 43.5 kg (96 lb)   General: Alert, Awake and Oriented to Time, Place and Person. Appear in moderate distress, affect appropriate Eyes: PERRL, Conjunctiva normal ENT: Oral Mucosa clear moist. Neck: positive JVD, no Abnormal Mass Or lumps Cardiovascular: S1 and S2 Present, aortic systolic Murmur, Respiratory: Bilateral Air entry equal and Decreased, decrease more on the right, no use of accessory muscle, basal Crackles, bilateral wheezes Abdomen: Bowel Sound present, Soft  and no tenderness Skin: no redness, no Rash, no induration Extremities: no Pedal edema, no calf tenderness Neurologic: Grossly no focal neuro deficit. Bilaterally Equal motor strength  Data Reviewed: CBC:  Recent Labs Lab 11/28/16 2118 11/29/16 0305 11/30/16 0526 11/30/16 1632 12/01/16 0325 12/02/16 0428  WBC 6.7 8.3 6.3 7.0 8.1 7.9  NEUTROABS 5.2  --   --  5.6  --   --   HGB 9.4* 9.3* 9.2* 8.9* 9.5* 10.3*  HCT 30.2* 30.3* 30.6* 29.3* 32.4* 35.5*  MCV 81.0 80.2 81.2 82.8 81.6 82.9  PLT 258 251 236 277 268 629   Basic Metabolic Panel:  Recent Labs Lab 11/28/16 2118 11/29/16 0305 11/30/16 0526 12/01/16 0325 12/02/16 0428  NA 136 136 138 137 136  K 3.0* 4.4 3.1* 4.4 4.4  CL 103 101 97* 98* 96*  CO2 25 26 32 30 33*  GLUCOSE 106* 106* 86 115* 90  BUN 27* 29* 18 17 17   CREATININE 1.08* 1.05* 0.87 0.78 0.81  CALCIUM 8.8* 8.8* 8.5* 9.0 9.4  MG 2.1  --  1.5* 2.0  --     Liver Function Tests:  Recent Labs Lab 11/28/16 2118  AST 67*  ALT 23  ALKPHOS 64  BILITOT 2.2*  PROT 7.8  ALBUMIN 3.5   No results for input(s): LIPASE, AMYLASE in  the last 168 hours. No results for input(s): AMMONIA in the last 168 hours. Coagulation Profile:  Recent Labs Lab 11/28/16 2118  INR 1.60   Cardiac Enzymes:  Recent Labs Lab 11/28/16 2118 11/29/16 0305 11/29/16 0734 11/29/16 1815  TROPONINI 0.03* 1.42* 0.04* 0.03*   BNP (last 3 results) No results for input(s): PROBNP in the last 8760 hours. CBG: No results for input(s): GLUCAP in the last 168 hours. Studies: No results found.  Scheduled Meds: . azaTHIOprine  50 mg Oral Daily  . digoxin  0.125 mg Oral Daily  . diltiazem  15 mg Oral Q6H  . feeding supplement (ENSURE ENLIVE)  237 mL Oral BID BM  . ferrous sulfate  325 mg Oral Q breakfast  . folic acid  1 mg Oral Daily  . furosemide  40 mg Oral Daily  . [START ON 12/03/2016] levothyroxine  75 mcg Oral QAC breakfast  . metoprolol tartrate  50 mg Oral BID  .  polyethylene glycol  17 g Oral Daily  . Rivaroxaban  15 mg Oral Q supper  . sodium chloride flush  3 mL Intravenous Q12H   Continuous Infusions: PRN Meds: acetaminophen **OR** acetaminophen, levalbuterol, polyethylene glycol, prochlorperazine  Time spent: 35 minutes  Author: Berle Mull, MD [facility] Pager: [phone] 12/02/2016 5:18 PM  If 7PM-7AM, please contact night-coverage at [email or web address], password [facility]

## 2016-12-02 NOTE — Progress Notes (Signed)
SUBJECTIVE: No CP or dyspnea  . azaTHIOprine  50 mg Oral Daily  . digoxin  0.125 mg Oral Daily  . feeding supplement (ENSURE ENLIVE)  237 mL Oral BID BM  . ferrous sulfate  325 mg Oral Q breakfast  . folic acid  1 mg Oral Daily  . furosemide  40 mg Intravenous Daily  . levothyroxine  88 mcg Oral QAC breakfast  . magnesium chloride  1 tablet Oral Daily  . metoprolol tartrate  50 mg Oral BID  . polyethylene glycol  17 g Oral Daily  . Rivaroxaban  15 mg Oral Q supper  . sodium chloride flush  3 mL Intravenous Q12H     OBJECTIVE: Physical Exam: Vitals:   12/01/16 2140 12/02/16 0040 12/02/16 0105 12/02/16 0205  BP:  (!) 89/64 92/68 95/65   Pulse: 74 92 (!) 49 80  Resp: 16     Temp: 98.5 F (36.9 C)     TempSrc: Oral     SpO2: 100% 99% 99% 100%  Weight:      Height:        Intake/Output Summary (Last 24 hours) at 12/02/16 0748 Last data filed at 12/01/16 2000  Gross per 24 hour  Intake              480 ml  Output             2150 ml  Net            -1670 ml    Telemetry reveals afib, V rates 100-110 bpm  GEN- The patient is frail, well appearing, NAD Head- normal Neck- supple Lungs- Mildly diminished BS bases Heart- irregular, tachycardic GI- soft, NT, ND, + BS Extremities- no edema Neuro- grossly intact  LABS: Basic Metabolic Panel:  Recent Labs  11/30/16 0526 12/01/16 0325 12/02/16 0428  NA 138 137 136  K 3.1* 4.4 4.4  CL 97* 98* 96*  CO2 32 30 33*  GLUCOSE 86 115* 90  BUN 18 17 17   CREATININE 0.87 0.78 0.81  CALCIUM 8.5* 9.0 9.4  MG 1.5* 2.0  --    CBC:  Recent Labs  11/30/16 1632 12/01/16 0325 12/02/16 0428  WBC 7.0 8.1 7.9  NEUTROABS 5.6  --   --   HGB 8.9* 9.5* 10.3*  HCT 29.3* 32.4* 35.5*  MCV 82.8 81.6 82.9  PLT 277 268 291   Cardiac Enzymes:  Recent Labs  11/29/16 1815  TROPONINI 0.03*    Anemia Panel:  Recent Labs  11/29/16 1815  VITAMINB12 1,077*     ASSESSMENT AND PLAN:   1. Persistent afib She appears to  have been in afib since at least November. Chads2vasc score is at least 6.  She has been treated with xarelto.  V rates are currently mildly elevated but improving. Continue metoprolol and digoxin. Would like to avoid amiodarone given elevated T4 and thyroid issues. Some of tachycardia likely driven by iatrogenic hyperthyroidism and should improve with reduced dose of synthroid. If BP allows could consider low dose cardizem later if she remains tachycardic.  2. Acute combined systolic/ diastolic dysfunction Echo with EF 35-40. 2 gram sodium diet Strict Is and Os Change lasix to 40 mg po daily  3. Elevated troponin Denies chest pain,  Likely demand ischemia in nature due to RVR No ischemic ekg changes; no plans for further ischemia eval at this point  4. Hypertension BP borderline; follow closely  5. Cardiomyopathy This is possibly secondary to tachycardia. Continue beta blocker.  Add digoxin as outlined above. Blood pressure will not allow an ACE inhibitor. We'll plan to repeat echocardiogram in 3 months once tachycardia is controlled. Could pursue further ischemia evaluation at that point if LV function remains reduced.  6. Iatrogenic hyperthyroidism Agree with adjustment of Synthroid dose as this is likely driving tachycardia.   Kirk Ruths, MD 12/02/2016 7:48 AM

## 2016-12-03 ENCOUNTER — Inpatient Hospital Stay (HOSPITAL_COMMUNITY): Payer: Medicare Other

## 2016-12-03 ENCOUNTER — Telehealth: Payer: Self-pay | Admitting: Cardiovascular Disease

## 2016-12-03 DIAGNOSIS — I5033 Acute on chronic diastolic (congestive) heart failure: Secondary | ICD-10-CM

## 2016-12-03 LAB — CBC
HEMATOCRIT: 34.7 % — AB (ref 36.0–46.0)
HEMOGLOBIN: 10.5 g/dL — AB (ref 12.0–15.0)
MCH: 24.8 pg — ABNORMAL LOW (ref 26.0–34.0)
MCHC: 30.3 g/dL (ref 30.0–36.0)
MCV: 82 fL (ref 78.0–100.0)
Platelets: 242 10*3/uL (ref 150–400)
RBC: 4.23 MIL/uL (ref 3.87–5.11)
RDW: 16.3 % — AB (ref 11.5–15.5)
WBC: 6.9 10*3/uL (ref 4.0–10.5)

## 2016-12-03 LAB — AMMONIA: Ammonia: 30 umol/L (ref 9–35)

## 2016-12-03 LAB — BASIC METABOLIC PANEL
ANION GAP: 11 (ref 5–15)
BUN: 26 mg/dL — ABNORMAL HIGH (ref 6–20)
CALCIUM: 9.1 mg/dL (ref 8.9–10.3)
CHLORIDE: 93 mmol/L — AB (ref 101–111)
CO2: 30 mmol/L (ref 22–32)
Creatinine, Ser: 0.81 mg/dL (ref 0.44–1.00)
GFR calc Af Amer: >60 mL/min (ref >60)
GFR calc non Af Amer: >60 mL/min (ref >60)
GLUCOSE: 77 mg/dL (ref 65–99)
Potassium: 4.2 mmol/L (ref 3.5–5.1)
Sodium: 134 mmol/L — ABNORMAL LOW (ref 135–145)

## 2016-12-03 MED ORDER — FUROSEMIDE 20 MG PO TABS: 10.0000 mg | ORAL_TABLET | Freq: Every day | ORAL | 0 refills | Status: DC

## 2016-12-03 MED ORDER — FERROUS SULFATE 325 (65 FE) MG PO TABS: 325.0000 mg | ORAL_TABLET | Freq: Every day | ORAL | 3 refills | Status: DC

## 2016-12-03 MED ORDER — POLYETHYLENE GLYCOL 3350 17 G PO PACK: 17.0000 g | PACK | Freq: Every day | ORAL | 0 refills | Status: DC

## 2016-12-03 MED ORDER — POTASSIUM CHLORIDE ER 10 MEQ PO TBCR: 20.0000 meq | EXTENDED_RELEASE_TABLET | Freq: Every day | ORAL | 0 refills | Status: DC

## 2016-12-03 MED ORDER — ALPRAZOLAM 0.5 MG PO TABS: ORAL_TABLET | ORAL | 0 refills | Status: DC

## 2016-12-03 MED ORDER — FOLIC ACID 1 MG PO TABS: 1.0000 mg | ORAL_TABLET | Freq: Every day | ORAL | Status: DC

## 2016-12-03 MED ORDER — DILTIAZEM HCL ER COATED BEADS 120 MG PO CP24: 120.0000 mg | ORAL_CAPSULE | Freq: Every day | ORAL | Status: DC

## 2016-12-03 MED ORDER — RISPERIDONE 0.25 MG PO TABS
0.2500 mg | ORAL_TABLET | Freq: Every day | ORAL | Status: DC
  Administered 2016-12-03: 0.25 mg via ORAL
  Filled 2016-12-03 (×2): qty 1

## 2016-12-03 MED ORDER — DEXTROSE 5 % IV SOLN
1.0000 g | Freq: Two times a day (BID) | INTRAVENOUS | Status: DC
  Administered 2016-12-03 – 2016-12-04 (×3): 1 g via INTRAVENOUS
  Filled 2016-12-03 (×3): qty 1

## 2016-12-03 MED ORDER — QUETIAPINE FUMARATE 25 MG PO TABS: 12.5000 mg | ORAL_TABLET | Freq: Every day | ORAL | Status: DC

## 2016-12-03 MED ORDER — DILTIAZEM HCL ER COATED BEADS 120 MG PO CP24
120.0000 mg | ORAL_CAPSULE | Freq: Every day | ORAL | Status: DC
  Administered 2016-12-03 – 2016-12-04 (×2): 120 mg via ORAL
  Filled 2016-12-03 (×2): qty 1

## 2016-12-03 MED ORDER — ENSURE ENLIVE PO LIQD: 237.0000 mL | Freq: Two times a day (BID) | ORAL | 12 refills | Status: DC

## 2016-12-03 MED ORDER — METOPROLOL TARTRATE 25 MG PO TABS: 50.0000 mg | ORAL_TABLET | Freq: Two times a day (BID) | ORAL | 11 refills | Status: DC

## 2016-12-03 MED ORDER — LEVOTHYROXINE SODIUM 75 MCG PO TABS: 75.0000 ug | ORAL_TABLET | Freq: Every day | ORAL | Status: DC

## 2016-12-03 NOTE — Progress Notes (Signed)
Pharmacy Antibiotic Note  Michele Diaz is a 83 y.o. female admitted on 11/28/2016 with [facility] UTI.  Pharmacy has been consulted for Aztreonam dosing.  Pt with AMS and is on Azathioprine for autoimmune hepatitis, based on this will treat UTI.  Pt with PCN allergy.  EColi is s: Septra, which may increase levels of Azathioprine, therefore Aztreonam will be used.   Plan: Aztreonam 1g IV q12 for UTI  Height: 5\' 2"  (157.5 cm) Weight: 95 lb 12.8 oz (43.5 kg) IBW/kg (Calculated) : 50.1  Temp (24hrs), Avg:97.8 F (36.6 C), Min:97.7 F (36.5 C), Max:97.8 F (36.6 C)   Recent Labs Lab 11/29/16 0305 11/30/16 0526 11/30/16 1632 12/01/16 0325 12/02/16 0428 12/03/16 0800  WBC 8.3 6.3 7.0 8.1 7.9 6.9  CREATININE 1.05* 0.87  --  0.78 0.81 0.81    Estimated Creatinine Clearance: 36.8 mL/min (by C-G formula based on SCr of 0.81 mg/dL).    Allergies  Allergen Reactions  . Atorvastatin Other (See Comments)    Elevated liver enzymes  . Celecoxib Shortness Of Breath, Itching and Rash    redness  . Rosuvastatin Other (See Comments)    Elevated liver enzymes  . Codeine Nausea Only  . Penicillins Hives    Antimicrobials this admission: 3/21 Aztreonam >>   Microbiology results: 3/17 UCx: [facility]    Thank you for allowing pharmacy to be a part of this patient's care.  Gracy Bruins, PharmD Clinical Pharmacist [facility]

## 2016-12-03 NOTE — Clinical Social Work Note (Signed)
Contact made with admissions director with [facility] and confirmed that they can accept patient on Thursday.  'Odelia Graciano Givens, MSW, [place] Licensed Clinical Social Worker [place] [phone]

## 2016-12-03 NOTE — Progress Notes (Signed)
PROGRESS NOTE        PATIENT DETAILS Name: Michele Diaz Age: 83 y.o. Sex: female Date of Birth: 10/30/1931 Admit Date: 11/28/2016 Admitting Physician Elwin Mocha, MD VVO:HYWV [name], MD  Brief Narrative: 83 year old female with history of or team and hepatitis on Imuran, chronic diastolic heart failure, paroxysmal atrial fibrillation on anticoagulation-admitted for evaluation of confusion. See below for further details  Subjective: Looks very frail-lying comfortably in bed. Sleepy but awake and alert.  Assessment/Plan: Acute metabolic encephalopathy: Felt to be multifactorial in a setting of hypoxia secondary to acute diastolic heart failure, medications, and possible iatrogenic hyperthyroidism. Improved, awake and fairly alert-follows all my commands-answers all my questions appropriately. Per RN, did not sleep until 3 AM this morning-and remains somewhat drowsy but easily arousable.  Acute hypoxic respiratory failure: Initially required oxygen, now on room air. Felt to be secondary to acute on chronic diastolic heart failure.  Acute on chronic combined systolic and diastolic heart failure: Clinically much improved-now much more compensated. -6.5 L so far, weight down to 95 pounds (108 pounds on admission). Now on oral diuretics, please continue to monitor weights and electrolytes closely while at SNF. Not a candidate for ace/ARB-as blood pressure soft  Atrial fibrillation with RVR: Initially started on a Cardizem infusion-but this was stopped because of soft blood pressure. Cardiology following closely, now on oral metoprolol, Cardizem and digitoxin. Rate remains stable mostly in the low 90s in the past 24 hours. Given issues with thyroid, cardiology avoiding amiodarone. Hopefully with adjustment in her Synthroid dosing, heart rate will continue to improve with time.Chads2vasc score is at least 6-continue xarelto  Cardiomyopathy: EF approximately  35-40% on echo on 11/30/16 (prior echo in 2017 showed preserved EF)-this is felt to be secondary to rate-related tachycardia. Plans are to optimize heart rate control, and repeat echo in the next 3-6 months. If on repeat echo, EF remains depressed-but then consider pursuing ischemia workup.  Mild hyperthyroidism: Synthroid dosage reduced to 75 g-hopefully this will help in reducing the heart rate. Please recheck TSH in 3 months.  Minimally elevated troponin: Suspect demand ischemia in the setting of RVR, cardiology not planning on any further workup at this time. See above regarding cardiomyopathy.  ?UTI: Denies any symptoms of dysuria-but is immunocompromised-on Imuran. Very hard to discern if she's had any symptoms related to this-urine culture does show 80,000 colonies of Escherichia coli-pansensitive. Start Azactam for a few days, will obtain a more collaborative data regarding her allergy to penicillin from patient when she is more awake and alert or when family arrives.   Bilateral pleural effusion: Suspect this may be related to CHF, no indication for thoracocentesis at this time. Plans are to continue diuresis, and reimage in the next few months. As noted above, no longer requiring oxygen supplementation.  Hypertension: Blood pressure soft-patient remains asymptomatic-losartan and remains on hold. She does remain on diuretics, Cardizem and beta blocker.  History of autoimmune hepatitis:LFTs appears stable, continue Imuran. Follow with primary gastroenterologist as previously scheduled. follow-up was admitted.  Anemia: Hemoglobin appears stable and close to her usual baseline-continue to follow in the outpatient setting. This is secondary to anemia of chronic disease  Generalized weakness/deconditioning/frailty: Very weak-frail, suspect best if discharge is to SNF for subacute rehabilitation.  DVT Prophylaxis: Xarelto  Code Status: Full code   Family  Communication: Granddaughter over the phone  Disposition Plan: Remain inpatient- SNF on discharge-hopefully 3/22  Antimicrobial agents: Anti-infectives    None      Procedures: Echo 3/18 >> - Left ventricle: The cavity size was normal. Systolic function was   moderately reduced. The estimated ejection fraction was in the   range of 35% to 40%. Wall motion was normal; there were no   regional wall motion abnormalities. - Mitral valve: There was moderate regurgitation. - Left atrium: The atrium was moderately dilated. - Tricuspid valve: There was mild-moderate regurgitation.  CONSULTS:  cardiology  Time spent: 25- minutes-Greater than 50% of this time was spent in counseling, explanation of diagnosis, planning of further management, and coordination of care.  MEDICATIONS: Scheduled Meds: . azaTHIOprine  50 mg Oral Daily  . diltiazem  120 mg Oral Daily  . feeding supplement (ENSURE ENLIVE)  237 mL Oral BID BM  . ferrous sulfate  325 mg Oral Q breakfast  . folic acid  1 mg Oral Daily  . furosemide  40 mg Oral Daily  . levothyroxine  75 mcg Oral QAC breakfast  . metoprolol tartrate  50 mg Oral BID  . polyethylene glycol  17 g Oral Daily  . Rivaroxaban  15 mg Oral Q supper  . senna-docusate  1 tablet Oral BID  . sodium chloride flush  3 mL Intravenous Q12H   Continuous Infusions: PRN Meds:.acetaminophen **OR** acetaminophen, levalbuterol, prochlorperazine   PHYSICAL EXAM: Vital signs: Vitals:   12/03/16 0021 12/03/16 0624 12/03/16 0906 12/03/16 1047  BP: (!) 99/45 122/85 104/73 95/79  Pulse:   95   Resp:  16    Temp:  97.8 F (36.6 C)    TempSrc:  Oral    SpO2:  95%    Weight:  43.5 kg (95 lb 12.8 oz)    Height:       Filed Weights   11/30/16 0355 12/01/16 0345 12/03/16 0624  Weight: 43.8 kg (96 lb 9.6 oz) 43.5 kg (96 lb) 43.5 kg (95 lb 12.8 oz)   Body mass index is 17.52 kg/m.   General appearance :Awake, alert, not in any distress. Speech Clear. Not  toxic Looking Eyes:, pupils equally reactive to light and accomodation,no scleral icterus. HEENT: Atraumatic and Normocephalic Neck: supple, no JVD. No cervical lymphadenopathy. Resp:Good air entry bilaterally, no added sounds  CVS: S1 S2 regular, no murmurs.  GI: Bowel sounds present, Non tender and not distended with no gaurding, rigidity or rebound Extremities: B/L Lower Ext shows no edema, both legs are warm to touch Neurology:  speech clear,Non focal, sensation is grossly intact. Musculoskeletal:No digital cyanosis Skin:No Rash, warm and dry Wounds:N/A  I have personally reviewed following labs and imaging studies  LABORATORY DATA: CBC:  Recent Labs Lab 11/28/16 2118  11/30/16 0526 11/30/16 1632 12/01/16 0325 12/02/16 0428 12/03/16 0800  WBC 6.7  < > 6.3 7.0 8.1 7.9 6.9  NEUTROABS 5.2  --   --  5.6  --   --   --   HGB 9.4*  < > 9.2* 8.9* 9.5* 10.3* 10.5*  HCT 30.2*  < > 30.6* 29.3* 32.4* 35.5* 34.7*  MCV 81.0  < > 81.2 82.8 81.6 82.9 82.0  PLT 258  < > 236 277 268 291 242  < > = values in this interval not displayed.  Basic Metabolic Panel:  Recent Labs Lab 11/28/16 2118 11/29/16 0305 11/30/16 0526 12/01/16 0325 12/02/16 0428 12/03/16 0800  NA 136 136 138 137 136 134*  K 3.0* 4.4 3.1* 4.4 4.4  4.2  CL 103 101 97* 98* 96* 93*  CO2 25 26 32 30 33* 30  GLUCOSE 106* 106* 86 115* 90 77  BUN 27* 29* 18 17 17  26*  CREATININE 1.08* 1.05* 0.87 0.78 0.81 0.81  CALCIUM 8.8* 8.8* 8.5* 9.0 9.4 9.1  MG 2.1  --  1.5* 2.0  --   --     GFR: Estimated Creatinine Clearance: 36.8 mL/min (by C-G formula based on SCr of 0.81 mg/dL).  Liver Function Tests:  Recent Labs Lab 11/28/16 2118  AST 67*  ALT 23  ALKPHOS 64  BILITOT 2.2*  PROT 7.8  ALBUMIN 3.5   No results for input(s): LIPASE, AMYLASE in the last 168 hours. No results for input(s): AMMONIA in the last 168 hours.  Coagulation Profile:  Recent Labs Lab 11/28/16 2118  INR 1.60    Cardiac  Enzymes:  Recent Labs Lab 11/28/16 2118 11/29/16 0305 11/29/16 0734 11/29/16 1815  TROPONINI 0.03* 1.42* 0.04* 0.03*    BNP (last 3 results) No results for input(s): PROBNP in the last 8760 hours.  HbA1C: No results for input(s): HGBA1C in the last 72 hours.  CBG: No results for input(s): GLUCAP in the last 168 hours.  Lipid Profile: No results for input(s): CHOL, HDL, LDLCALC, TRIG, CHOLHDL, LDLDIRECT in the last 72 hours.  Thyroid Function Tests: No results for input(s): TSH, T4TOTAL, FREET4, T3FREE, THYROIDAB in the last 72 hours.  Anemia Panel: No results for input(s): VITAMINB12, FOLATE, FERRITIN, TIBC, IRON, RETICCTPCT in the last 72 hours.  Urine analysis:    Component Value Date/Time   COLORURINE YELLOW 11/28/2016 2246   APPEARANCEUR CLEAR 11/28/2016 2246   LABSPEC 1.024 11/28/2016 2246   PHURINE 5.0 11/28/2016 2246   GLUCOSEU NEGATIVE 11/28/2016 2246   GLUCOSEU NEGATIVE 11/13/2010 1058   HGBUR NEGATIVE 11/28/2016 2246   BILIRUBINUR NEGATIVE 11/28/2016 2246   KETONESUR NEGATIVE 11/28/2016 2246   PROTEINUR 100 (A) 11/28/2016 2246   UROBILINOGEN 0.2 01/07/2013 1626   NITRITE NEGATIVE 11/28/2016 2246   LEUKOCYTESUR MODERATE (A) 11/28/2016 2246    Sepsis Labs: Lactic Acid, Venous No results found for: LATICACIDVEN  MICROBIOLOGY: Recent Results (from the past 240 hour(s))  MRSA PCR Screening     Status: None   Collection Time: 11/28/16  6:54 PM  Result Value Ref Range Status   MRSA by PCR NEGATIVE NEGATIVE Final    Comment:        The GeneXpert MRSA Assay (FDA approved for NASAL specimens only), is one component of a comprehensive MRSA colonization surveillance program. It is not intended to diagnose MRSA infection nor to guide or monitor treatment for MRSA infections.   Culture, Urine     Status: Abnormal   Collection Time: 11/29/16  3:53 PM  Result Value Ref Range Status   Specimen Description URINE, RANDOM  Final   Special Requests NONE   Final   Culture 80,000 COLONIES/mL ESCHERICHIA COLI (A)  Final   Report Status 12/01/2016 FINAL  Final   Organism ID, Bacteria ESCHERICHIA COLI (A)  Final      Susceptibility   Escherichia coli - MIC*    AMPICILLIN <=2 SENSITIVE Sensitive     CEFAZOLIN <=4 SENSITIVE Sensitive     CEFTRIAXONE <=1 SENSITIVE Sensitive     CIPROFLOXACIN <=0.25 SENSITIVE Sensitive     GENTAMICIN <=1 SENSITIVE Sensitive     IMIPENEM <=0.25 SENSITIVE Sensitive     NITROFURANTOIN <=16 SENSITIVE Sensitive     TRIMETH/SULFA <=20 SENSITIVE Sensitive  AMPICILLIN/SULBACTAM <=2 SENSITIVE Sensitive     PIP/TAZO <=4 SENSITIVE Sensitive     Extended ESBL NEGATIVE Sensitive     * 80,000 COLONIES/mL ESCHERICHIA COLI    RADIOLOGY STUDIES/RESULTS: Dg Chest 2 View  Result Date: 12/03/2016 CLINICAL DATA:  Pleural effusion EXAM: CHEST  2 VIEW COMPARISON:  11/30/2016 FINDINGS: Right pleural effusion has improved. No pneumothorax. Vascular congestion. Tiny left pleural effusion. Mild cardiomegaly. IMPRESSION: Improved right pleural effusion Stable vascular congestion. Electronically Signed   By: Marybelle Killings M.D.   On: 12/03/2016 08:33   X-ray Chest Pa And Lateral  Result Date: 11/28/2016 CLINICAL DATA:  Confusion.  History of asthma, cancer, hypertension. EXAM: CHEST  2 VIEW COMPARISON:  08/01/2016 FINDINGS: Diffuse cardiac enlargement. Pulmonary vascular congestion. Interstitial infiltrates in the lungs consistent with interstitial edema. Small bilateral pleural effusions. Atelectasis in the right lung base. Progression since previous study. Chronic right apical pleural thickening. Fluid in the right minor fissure. Calcified and tortuous aorta. IMPRESSION: Cardiac enlargement with pulmonary vascular congestion, interstitial edema, and small bilateral pleural effusions progressing since prior study. Atelectasis in the right lung base. Electronically Signed   By: Lucienne Capers M.D.   On: 11/28/2016 22:50   Ct Head Wo  Contrast  Result Date: 11/28/2016 CLINICAL DATA:  83 y/o  F; confusion. EXAM: CT HEAD WITHOUT CONTRAST TECHNIQUE: Contiguous axial images were obtained from the base of the skull through the vertex without intravenous contrast. COMPARISON:  01/07/2013 CT of the head and MRI of the head. FINDINGS: Brain: Interval left superior cerebellar chronic appearing infarct. No evidence for large territory infarct, focal mass effect, or intracranial hemorrhage. Stable moderate chronic microvascular ischemic changes in white matter and mild brain parenchymal volume loss. Stable chronic lacunar infarct in the right anterior corona radiata. No hydrocephalus, extra-axial collection, or effacement of basilar cisterns. Vascular: Moderate calcific atherosclerosis of cavernous and paraclinoid internal carotid arteries. Skull: Normal. Negative for fracture or focal lesion. Sinuses/Orbits: Small left maxillary sinus mucous retention cyst. Bilateral intra-ocular lens replacement. Otherwise negative. Other: None. IMPRESSION: 1.  No acute intracranial abnormality identified. 2. Interval chronic appearing infarct in the left superior cerebellar hemisphere from 2014. 3. Stable moderate microvascular ischemic changes and mild brain parenchymal volume loss. Electronically Signed   By: Kristine Garbe M.D.   On: 11/28/2016 22:07   Ct Angio Chest Pe W Or Wo Contrast  Result Date: 11/30/2016 CLINICAL DATA:  Chest pain, hypoxia and tachycardia. History of breast cancer. EXAM: CT ANGIOGRAPHY CHEST WITH CONTRAST TECHNIQUE: Multidetector CT imaging of the chest was performed using the standard protocol during bolus administration of intravenous contrast. Multiplanar CT image reconstructions and MIPs were obtained to evaluate the vascular anatomy. CONTRAST:  75 cc Isovue 370 COMPARISON:  Portable chest obtained earlier today. Chest CT dated 01/30/2009. FINDINGS: Cardiovascular: Normally opacified pulmonary arteries with no pulmonary  arterial filling defects seen. Coronary artery and aortic calcifications. Mildly enlarged heart. Mediastinum/Nodes: No enlarged lymph nodes.  Small thyroid gland. Lungs/Pleura: Progressive interstitial prominence involving the anterior right upper lobe. Mild bilateral lower lobe atelectasis. Moderate-sized right pleural effusion and small left pleural effusion. Upper Abdomen: Refluxed contrast into the inferior vena cava and hepatic veins. Musculoskeletal: Mild thoracic spine degenerative changes. Interval mild upper thoracic vertebral superior endplate compression deformity and associated sclerosis. Minimal bony retropulsion. No acute fracture lines. Review of the MIP images confirms the above findings. IMPRESSION: 1. No pulmonary emboli. 2. Moderately large right pleural effusion and smaller left pleural effusion. 3. Progressive interstitial prominence in the  anterior right upper lobe. This may represent progressive radiation fibrosis. 4. Interval old, mild upper thoracic vertebral superior endplate compression deformity and associated sclerosis. Electronically Signed   By: Claudie Revering M.D.   On: 11/30/2016 14:02   Mr Brain Wo Contrast  Result Date: 11/29/2016 CLINICAL DATA:  Acute encephalopathy. History of breast cancer and autoimmune hepatitis. EXAM: MRI HEAD WITHOUT CONTRAST TECHNIQUE: Multiplanar, multiecho pulse sequences of the brain and surrounding structures were obtained without intravenous contrast. COMPARISON:  Head CT 11/28/2016 and MRI 01/07/2013 FINDINGS: Brain: There is no evidence of acute infarct, mass, midline shift, or extra-axial fluid collection. Scattered chronic microhemorrhages are present throughout both cerebral hemispheres, predominantly in peripheral in location and more conspicuous than on the prior MRI which may reflect a true interval increase in number versus increased visibility from differences in technique. There is a chronic left superior cerebellar infarct which is new  from the prior MRI. Patchy T2 hyperintensities in the subcortical and deep cerebral white matter bilaterally have mildly progressed from the prior MRI and are nonspecific but compatible with moderate chronic small vessel ischemic disease. There is mild cerebral atrophy. Vascular: Major intracranial vascular flow voids are preserved. Skull and upper cervical spine: Unremarkable bone marrow signal. Sinuses/Orbits: Bilateral cataract extraction. Small left maxillary sinus mucous retention cyst. Minimal sphenoid sinus mucosal thickening. No significant mastoid fluid. Other: None. IMPRESSION: 1. No acute intracranial abnormality. 2. Moderate chronic small vessel ischemic disease, progressed from 2014. 3. Chronic left cerebellar infarct. Electronically Signed   By: Logan Bores M.D.   On: 11/29/2016 16:52   Dg Chest Port 1 View  Result Date: 11/30/2016 CLINICAL DATA:  Shortness of breath. EXAM: PORTABLE CHEST 1 VIEW COMPARISON:  11/28/2016. FINDINGS: Stable enlarged cardiac silhouette. Decreased prominence of the pulmonary vasculature and interstitial markings. Increased density at the right lung base with decreased elevation of the right hemidiaphragm stable right apical pleural thickening and adjacent parenchymal scarring. Right pleural fluid and minimal left pleural fluid. Cervical spine degenerative changes. IMPRESSION: 1. Increased right basilar atelectasis or pneumonia and pleural fluid. 2. Minimal left pleural effusion. 3. Stable cardiomegaly. 4. Improved changes of congestive heart failure. 5. Aortic atherosclerosis. Electronically Signed   By: Claudie Revering M.D.   On: 11/30/2016 10:43     LOS: 4 days   Oren Binet, MD  [facility] Pager:[phone]  If 7PM-7AM, please contact night-coverage [email or web address] Password [email or web address] 12/03/2016, 1:40 PM

## 2016-12-03 NOTE — Care Management Important Message (Signed)
Important Message  Patient Details  Name: Michele Diaz MRN: [id number] Date of Birth: 1931/12/03   Medicare Important Message Given:  Yes    Tanis Hensarling [address] 12/03/2016, 12:20 PM

## 2016-12-03 NOTE — Care Management Note (Signed)
Case Management Note  Patient Details  Name: Michele Diaz MRN: [id number] Date of Birth: 05-10-1932  Subjective/Objective: Pt presented for Acute Metabolic Encephalopathy 2/2 hypoxia. Treated for CHF- IV Azactam for UTI. Plan for SNF 12-04-16                  Action/Plan: CSW is following the patient for disposition needs. Plan for d/c to [facility] on 12-04-16. No further needs from CM at this time.   Expected Discharge Date:                  Expected Discharge Plan:  Skilled Nursing Facility  In-House Referral:  Clinical Social Work  Discharge planning Services  CM Consult  Post Acute Care Choice:  NA Choice offered to:  NA  DME Arranged:  N/A DME Agency:  NA  HH Arranged:  NA HH Agency:  NA  Status of Service:  Completed, signed off  If discussed at [place] of Stay Meetings, dates discussed:    Additional Comments:  Bethena Roys, RN 12/03/2016, 3:52 PM

## 2016-12-03 NOTE — Telephone Encounter (Signed)
Patient currently admitted [facility] outreach to be made after hospital discharge

## 2016-12-03 NOTE — Progress Notes (Signed)
SUBJECTIVE: No CP or dyspnea; no palpitations  . azaTHIOprine  50 mg Oral Daily  . digoxin  0.125 mg Oral Daily  . diltiazem  15 mg Oral Q6H  . feeding supplement (ENSURE ENLIVE)  237 mL Oral BID BM  . ferrous sulfate  325 mg Oral Q breakfast  . folic acid  1 mg Oral Daily  . furosemide  40 mg Oral Daily  . levothyroxine  75 mcg Oral QAC breakfast  . metoprolol tartrate  50 mg Oral BID  . polyethylene glycol  17 g Oral Daily  . QUEtiapine  12.5 mg Oral QHS  . Rivaroxaban  15 mg Oral Q supper  . senna-docusate  1 tablet Oral BID  . sodium chloride flush  3 mL Intravenous Q12H     OBJECTIVE: Physical Exam: Vitals:   12/02/16 2012 12/03/16 0021 12/03/16 0624 12/03/16 0906  BP: 99/63 (!) 99/45 122/85 104/73  Pulse:    95  Resp:   16   Temp: 97.8 F (36.6 C)  97.8 F (36.6 C)   TempSrc: Oral  Oral   SpO2: 94%  95%   Weight:   95 lb 12.8 oz (43.5 kg)   Height:        Intake/Output Summary (Last 24 hours) at 12/03/16 3734 Last data filed at 12/02/16 2020  Gross per 24 hour  Intake              130 ml  Output              300 ml  Net             -170 ml    Telemetry reveals afib, V rates 100 bpm  GEN- The patient is frail, well appearing, NAD Head- normal Neck- supple Lungs- CTA Heart- irregular GI- soft, NT, ND, + BS Extremities- no edema Neuro- grossly intact  LABS: Basic Metabolic Panel:  Recent Labs  12/01/16 0325 12/02/16 0428 12/03/16 0800  NA 137 136 134*  K 4.4 4.4 4.2  CL 98* 96* 93*  CO2 30 33* 30  GLUCOSE 115* 90 77  BUN 17 17 26*  CREATININE 0.78 0.81 0.81  CALCIUM 9.0 9.4 9.1  MG 2.0  --   --    CBC:  Recent Labs  11/30/16 1632  12/02/16 0428 12/03/16 0800  WBC 7.0  < > 7.9 6.9  NEUTROABS 5.6  --   --   --   HGB 8.9*  < > 10.3* 10.5*  HCT 29.3*  < > 35.5* 34.7*  MCV 82.8  < > 82.9 82.0  PLT 277  < > 291 242  < > = values in this interval not displayed.   ASSESSMENT AND PLAN:   1. Persistent afib She remains in atrial  fibrillation. Chads2vasc score is at least 6.  She has been treated with xarelto.  V rates are currently mildly elevated but improving. Continue metoprolol. Change cardizem to 120 mg CD daily. DC digoxin. Would like to avoid amiodarone given elevated T4 and thyroid issues. Some of tachycardia likely driven by iatrogenic hyperthyroidism and should improve with reduced dose of synthroid. If HR controlled and BP stable today, could DC this PM or in AM. Will need TOC appt one week and close fu with Dr Gwenlyn Found.  2. Acute combined systolic/ diastolic dysfunction Echo with EF 35-40. 2 gram sodium diet Strict Is and Os Continue lasix 40 mg po daily; check K and renal function one week.  3. Elevated troponin  Denies chest pain,  Likely demand ischemia in nature due to RVR No ischemic ekg changes; no plans for further ischemia eval at this point  4. Hypertension BP borderline; follow closely  5. Cardiomyopathy This is possibly secondary to tachycardia. Continue beta blocker. Blood pressure will not allow an ACE inhibitor. We'll plan to repeat echocardiogram in 3 months once tachycardia is controlled. Could pursue further ischemia evaluation at that point if LV function remains reduced.  6. Iatrogenic hyperthyroidism Agree with adjustment of Synthroid dose as this is likely driving tachycardia.   Kirk Ruths, MD 12/03/2016 9:37 AM

## 2016-12-03 NOTE — Telephone Encounter (Signed)
TOC phone Call . Appt on 12/11/16 at 3pm w/ Kerin Ransom .Thanks

## 2016-12-03 NOTE — Discharge Summary (Addendum)
PATIENT DETAILS Name: Michele Diaz Age: 83 y.o. Sex: female Date of Birth: 27-Jun-1932 MRN: [id number]. Admitting Physician: Elwin Mocha, MD OIN:OMVE [name], MD  Admit Date: 11/28/2016 Discharge date: 12/04/2016  Recommendations for Outpatient Follow-up:  1. Follow up with PCP in 1-2 weeks 2. Please obtain BMP/CBC in one week 3. Please repeat TSH in 3 months-dose adjusted to 75 g 4. Please ensure follow-up with cardiology.  Admitted From:  Home  Disposition: SNF   Home Health: No  Equipment/Devices: None  Discharge Condition: Stable  CODE STATUS: FULL CODE  Diet recommendation:  Heart Healthy  Brief Summary: See H&P, Labs, Consult and Test reports for all details in brief, patient is a 83 year old female with history of autoimmune hepatitis on Imuran, chronic diastolic heart failure, paroxysmal atrial fibrillation on anticoagulation-admitted for evaluation of confusion. See below for further details  Brief Hospital Course: Acute metabolic encephalopathy: Felt to be multifactorial in a setting of hypoxia secondary to acute diastolic heart failure, possible toxic component due to medications. CT head on admission was negative for acute abnormalities. Improved supportive measures, awake and fairly alert-follows all my commands-answers all my questions appropriately. Given issues with intermittent confusion/sundowning in the hospital, she was initially on Seroquel-but actually has done better with risperidone-this is being continued on discharge.  Acute hypoxic respiratory failure: Resolved. Initially required oxygen, now on room air. Felt to be secondary to acute on chronic diastolic heart failure.  Acute on chronic combined systolic and diastolic heart failure: Clinically much improved-now much more compensated. -7.0 L so far, weight down to 96 pounds (108 pounds on admission). Now on oral diuretics, please continue to monitor weights and electrolytes closely  while at SNF. Not a candidate for ace/ARB-as blood pressure soft-reevaluate at next visit with cardiology.  Atrial fibrillation with RVR: Initially started on a Cardizem infusion-but this was stopped because of soft blood pressure. Cardiology followed closely, now on oral metoprolol, Cardizem. Rate remains stable mostly in the low 90s in the past 24 hours. Given issues with thyroid, cardiology avoiding amiodarone. Hopefully with adjustment in her Synthroid dosing, heart rate will continue to improve with time.Chads2vasc score is at least 6-continue xarelto. Please ensure follow-up with cardiology-see below regarding appointment.  Cardiomyopathy: EF approximately 35-40% on echo on 11/30/16 (prior echo in 2017 showed preserved EF)-this is felt to be secondary to rate-related tachycardia. Plans are to optimize heart rate control, and repeat echo in the next 3-6 months. If on repeat echo, EF remains depressed-but then consider pursuing ischemia workup.  Mild hyperthyroidism: Synthroid dosage reduced to 75 g-hopefully this will help in reducing the heart rate. Please recheck TSH in 3 months.  Minimally elevated troponin: Suspect demand ischemia in the setting of RVR, cardiology not planning on any further workup at this time. See above regarding cardiomyopathy.  ?UTI: Denies any symptoms of dysuria-but is immunocompromised-on Imuran. Very hard to discern if she's had any symptoms related to this-urine culture does show 80,000 colonies of Escherichia coli-pansensitive. Treated with IV estrogen and 1 in the hospital, will transition to [facility] on discharge. Antibiotic stop date 3/23  Bilateral pleural effusion: Suspect this may be related to CHF, no indication for thoracocentesis at this time. Plans are to continue diuresis, and reimage in the next few months. As noted above, no longer requiring oxygen supplementation.  Hypertension: Blood pressure soft-patient remains asymptomatic-losartan and remains on  hold. She does remain on diuretics, Cardizem and beta blocker.  History of autoimmune hepatitis:LFTs appears stable, continue Imuran. Follow with  primary gastroenterologist as previously scheduled. follow-up was admitted.  Anemia: Hemoglobin appears stable and close to her usual baseline-continue to follow in the outpatient setting. This is secondary to anemia of chronic disease  Generalized deconditioning/frailty: Elderly female with multiple medical problems-now with generalized deconditioning following her acute illness. Seen by physical therapy, recommendations are to transfer to SNF on discharge. Both patient and family agreeable.  Underweight: continue supplements   Note-spoke with her granddaughter over the phone on the day of discharge.  Procedures/Studies: Echo 3/18 >> - Left ventricle: The cavity size was normal. Systolic function was moderately reduced. The estimated ejection fraction was in the range of 35% to 40%. Wall motion was normal; there were no regional wall motion abnormalities. - Mitral valve: There was moderate regurgitation. - Left atrium: The atrium was moderately dilated. - Tricuspid valve: There was mild-moderate regurgitation.  Discharge Diagnoses:  Principal Problem:   Acute metabolic encephalopathy Active Problems:   Hypothyroidism, acquired   Essential hypertension   Atrial fibrillation with rapid ventricular response (HCC)   Confusion   Acute on chronic diastolic CHF (congestive heart failure) (HCC)   Autoimmune hepatitis (HCC)   Acute on chronic diastolic heart failure [facility])   Discharge Instructions:  Activity:  As tolerated with Full fall precautions use walker/cane & assistance as needed  Discharge Instructions    (HEART FAILURE PATIENTS) Call MD:  Anytime you have any of the following symptoms: 1) 3 pound weight gain in 24 hours or 5 pounds in 1 week 2) shortness of breath, with or without a dry hacking cough 3) swelling in the  hands, feet or stomach 4) if you have to sleep on extra pillows at night in order to breathe.    Complete by:  As directed    Diet - low sodium heart healthy    Complete by:  As directed    Diet - low sodium heart healthy    Complete by:  As directed    Increase activity slowly    Complete by:  As directed    Increase activity slowly    Complete by:  As directed      Allergies as of 12/04/2016      Reactions   Atorvastatin Other (See Comments)   Elevated liver enzymes   Celecoxib Shortness Of Breath, Itching, Rash   redness   Rosuvastatin Other (See Comments)   Elevated liver enzymes   Codeine Nausea Only   Penicillins Hives      Medication List    STOP taking these medications   losartan 50 MG tablet Commonly known as:  COZAAR     TAKE these medications   albuterol 108 (90 Base) MCG/ACT inhaler Commonly known as:  PROVENTIL HFA;VENTOLIN HFA Inhale 1-2 puffs into the lungs every 6 (six) hours as needed for wheezing or shortness of breath.   ALPRAZolam 0.5 MG tablet Commonly known as:  XANAX TAKE 1/2 TABLET TWICE A DAY BY MOUTH AS NEEDED FOR ANXIETY What changed:  See the new instructions.   azaTHIOprine 50 MG tablet Commonly known as:  IMURAN TAKE 1 TABLET (50 MG TOTAL) BY MOUTH DAILY.   cholecalciferol 1000 units tablet Commonly known as:  VITAMIN D Take 1 tablet (1,000 Units total) by mouth daily.   diltiazem 120 MG 24 hr capsule Commonly known as:  CARDIZEM CD Take 1 capsule (120 mg total) by mouth daily.   escitalopram 10 MG tablet Commonly known as:  LEXAPRO TAKE 1 TABLET BY MOUTH DAILY   feeding supplement (  ENSURE ENLIVE) Liqd Take 237 mLs by mouth 2 (two) times daily between meals.   ferrous sulfate 325 (65 FE) MG tablet Take 1 tablet (325 mg total) by mouth daily with breakfast.   folic acid 1 MG tablet Commonly known as:  FOLVITE Take 1 tablet (1 mg total) by mouth daily.   furosemide 40 MG tablet Commonly known as:  LASIX Take 1 tablet (40  mg total) by mouth daily. What changed:  medication strength  how much to take  when to take this  reasons to take this   levothyroxine 75 MCG tablet Commonly known as:  SYNTHROID, LEVOTHROID Take 1 tablet (75 mcg total) by mouth daily before breakfast. What changed:  medication strength  how much to take   meclizine 12.5 MG tablet Commonly known as:  ANTIVERT Take 1 tablet (12.5 mg total) by mouth 2 (two) times daily as needed for dizziness.   metoprolol tartrate 25 MG tablet Commonly known as:  LOPRESSOR Take 2 tablets (50 mg total) by mouth 2 (two) times daily. What changed:  how much to take   nitrofurantoin (macrocrystal-monohydrate) 100 MG capsule Commonly known as:  MACROBID Take 1 capsule (100 mg total) by mouth 2 (two) times daily. Stop on 3/23 evening   pantoprazole 40 MG tablet Commonly known as:  PROTONIX Take 1 tablet (40 mg total) by mouth daily.   polyethylene glycol packet Commonly known as:  MIRALAX / GLYCOLAX Take 17 g by mouth daily.   potassium chloride 10 MEQ tablet Commonly known as:  K-DUR Take 2 tablets (20 mEq total) by mouth daily. What changed:  how much to take  when to take this  reasons to take this   risperiDONE 0.25 MG tablet Commonly known as:  RISPERDAL Take 1 tablet (0.25 mg total) by mouth at bedtime.   Rivaroxaban 15 MG Tabs tablet Commonly known as:  XARELTO Take 1 tablet (15 mg total) by mouth daily. What changed:  when to take this      Follow-up Information    Kerin Ransom, PA-C Follow up on 12/11/2016.   Specialties:  Cardiology, Radiology Why:  3:00 pm for [facility] appt and hospital follow up Contact information: [place] [address] [phone]        Walker Kehr, MD. Schedule an appointment as soon as possible for a visit in 1 week(s).   Specialty:  Internal Medicine Contact information: [place] [address] [phone]          Allergies    Allergen Reactions  . Atorvastatin Other (See Comments)    Elevated liver enzymes  . Celecoxib Shortness Of Breath, Itching and Rash    redness  . Rosuvastatin Other (See Comments)    Elevated liver enzymes  . Codeine Nausea Only  . Penicillins Hives      Consultations:   cardiology  Other Procedures/Studies: Dg Chest 2 View  Result Date: 12/03/2016 CLINICAL DATA:  Pleural effusion EXAM: CHEST  2 VIEW COMPARISON:  11/30/2016 FINDINGS: Right pleural effusion has improved. No pneumothorax. Vascular congestion. Tiny left pleural effusion. Mild cardiomegaly. IMPRESSION: Improved right pleural effusion Stable vascular congestion. Electronically Signed   By: Marybelle Killings M.D.   On: 12/03/2016 08:33   X-ray Chest Pa And Lateral  Result Date: 11/28/2016 CLINICAL DATA:  Confusion.  History of asthma, cancer, hypertension. EXAM: CHEST  2 VIEW COMPARISON:  08/01/2016 FINDINGS: Diffuse cardiac enlargement. Pulmonary vascular congestion. Interstitial infiltrates in the lungs consistent with interstitial  edema. Small bilateral pleural effusions. Atelectasis in the right lung base. Progression since previous study. Chronic right apical pleural thickening. Fluid in the right minor fissure. Calcified and tortuous aorta. IMPRESSION: Cardiac enlargement with pulmonary vascular congestion, interstitial edema, and small bilateral pleural effusions progressing since prior study. Atelectasis in the right lung base. Electronically Signed   By: Lucienne Capers M.D.   On: 11/28/2016 22:50   Ct Head Wo Contrast  Result Date: 11/28/2016 CLINICAL DATA:  83 y/o  F; confusion. EXAM: CT HEAD WITHOUT CONTRAST TECHNIQUE: Contiguous axial images were obtained from the base of the skull through the vertex without intravenous contrast. COMPARISON:  01/07/2013 CT of the head and MRI of the head. FINDINGS: Brain: Interval left superior cerebellar chronic appearing infarct. No evidence for large territory infarct, focal mass  effect, or intracranial hemorrhage. Stable moderate chronic microvascular ischemic changes in white matter and mild brain parenchymal volume loss. Stable chronic lacunar infarct in the right anterior corona radiata. No hydrocephalus, extra-axial collection, or effacement of basilar cisterns. Vascular: Moderate calcific atherosclerosis of cavernous and paraclinoid internal carotid arteries. Skull: Normal. Negative for fracture or focal lesion. Sinuses/Orbits: Small left maxillary sinus mucous retention cyst. Bilateral intra-ocular lens replacement. Otherwise negative. Other: None. IMPRESSION: 1.  No acute intracranial abnormality identified. 2. Interval chronic appearing infarct in the left superior cerebellar hemisphere from 2014. 3. Stable moderate microvascular ischemic changes and mild brain parenchymal volume loss. Electronically Signed   By: Kristine Garbe M.D.   On: 11/28/2016 22:07   Ct Angio Chest Pe W Or Wo Contrast  Result Date: 11/30/2016 CLINICAL DATA:  Chest pain, hypoxia and tachycardia. History of breast cancer. EXAM: CT ANGIOGRAPHY CHEST WITH CONTRAST TECHNIQUE: Multidetector CT imaging of the chest was performed using the standard protocol during bolus administration of intravenous contrast. Multiplanar CT image reconstructions and MIPs were obtained to evaluate the vascular anatomy. CONTRAST:  75 cc Isovue 370 COMPARISON:  Portable chest obtained earlier today. Chest CT dated 01/30/2009. FINDINGS: Cardiovascular: Normally opacified pulmonary arteries with no pulmonary arterial filling defects seen. Coronary artery and aortic calcifications. Mildly enlarged heart. Mediastinum/Nodes: No enlarged lymph nodes.  Small thyroid gland. Lungs/Pleura: Progressive interstitial prominence involving the anterior right upper lobe. Mild bilateral lower lobe atelectasis. Moderate-sized right pleural effusion and small left pleural effusion. Upper Abdomen: Refluxed contrast into the inferior vena cava  and hepatic veins. Musculoskeletal: Mild thoracic spine degenerative changes. Interval mild upper thoracic vertebral superior endplate compression deformity and associated sclerosis. Minimal bony retropulsion. No acute fracture lines. Review of the MIP images confirms the above findings. IMPRESSION: 1. No pulmonary emboli. 2. Moderately large right pleural effusion and smaller left pleural effusion. 3. Progressive interstitial prominence in the anterior right upper lobe. This may represent progressive radiation fibrosis. 4. Interval old, mild upper thoracic vertebral superior endplate compression deformity and associated sclerosis. Electronically Signed   By: Claudie Revering M.D.   On: 11/30/2016 14:02   Mr Brain Wo Contrast  Result Date: 11/29/2016 CLINICAL DATA:  Acute encephalopathy. History of breast cancer and autoimmune hepatitis. EXAM: MRI HEAD WITHOUT CONTRAST TECHNIQUE: Multiplanar, multiecho pulse sequences of the brain and surrounding structures were obtained without intravenous contrast. COMPARISON:  Head CT 11/28/2016 and MRI 01/07/2013 FINDINGS: Brain: There is no evidence of acute infarct, mass, midline shift, or extra-axial fluid collection. Scattered chronic microhemorrhages are present throughout both cerebral hemispheres, predominantly in peripheral in location and more conspicuous than on the prior MRI which may reflect a true interval increase in number versus  increased visibility from differences in technique. There is a chronic left superior cerebellar infarct which is new from the prior MRI. Patchy T2 hyperintensities in the subcortical and deep cerebral white matter bilaterally have mildly progressed from the prior MRI and are nonspecific but compatible with moderate chronic small vessel ischemic disease. There is mild cerebral atrophy. Vascular: Major intracranial vascular flow voids are preserved. Skull and upper cervical spine: Unremarkable bone marrow signal. Sinuses/Orbits: Bilateral  cataract extraction. Small left maxillary sinus mucous retention cyst. Minimal sphenoid sinus mucosal thickening. No significant mastoid fluid. Other: None. IMPRESSION: 1. No acute intracranial abnormality. 2. Moderate chronic small vessel ischemic disease, progressed from 2014. 3. Chronic left cerebellar infarct. Electronically Signed   By: Logan Bores M.D.   On: 11/29/2016 16:52   Dg Chest Port 1 View  Result Date: 11/30/2016 CLINICAL DATA:  Shortness of breath. EXAM: PORTABLE CHEST 1 VIEW COMPARISON:  11/28/2016. FINDINGS: Stable enlarged cardiac silhouette. Decreased prominence of the pulmonary vasculature and interstitial markings. Increased density at the right lung base with decreased elevation of the right hemidiaphragm stable right apical pleural thickening and adjacent parenchymal scarring. Right pleural fluid and minimal left pleural fluid. Cervical spine degenerative changes. IMPRESSION: 1. Increased right basilar atelectasis or pneumonia and pleural fluid. 2. Minimal left pleural effusion. 3. Stable cardiomegaly. 4. Improved changes of congestive heart failure. 5. Aortic atherosclerosis. Electronically Signed   By: Claudie Revering M.D.   On: 11/30/2016 10:43     TODAY-DAY OF DISCHARGE:  Subjective:   Michele Diaz today has no headache,no chest abdominal pain,no new weakness tingling or numbness, feels much better wants to go home today.   Objective:   Blood pressure 112/80, pulse 77, temperature 97.8 F (36.6 C), temperature source Oral, resp. rate 16, height 5\' 2"  (1.575 m), weight 43.7 kg (96 lb 4.8 oz), SpO2 99 %.  Intake/Output Summary (Last 24 hours) at 12/04/16 1135 Last data filed at 12/04/16 1059  Gross per 24 hour  Intake              420 ml  Output             1200 ml  Net             -780 ml   Filed Weights   12/01/16 0345 12/03/16 0624 12/04/16 0408  Weight: 43.5 kg (96 lb) 43.5 kg (95 lb 12.8 oz) 43.7 kg (96 lb 4.8 oz)    Exam: Awake Alert, Oriented *3, No new  F.N deficits, Normal affect [place].AT,PERRAL Supple Neck,No JVD, No cervical lymphadenopathy appriciated.  Symmetrical Chest wall movement, Good air movement bilaterally, CTAB RRR,No Gallops,Rubs or new Murmurs, No Parasternal Heave +ve B.Sounds, Abd Soft, Non tender, No organomegaly appriciated, No rebound -guarding or rigidity. No Cyanosis, Clubbing or edema, No new Rash or bruise   PERTINENT RADIOLOGIC STUDIES: Dg Chest 2 View  Result Date: 12/03/2016 CLINICAL DATA:  Pleural effusion EXAM: CHEST  2 VIEW COMPARISON:  11/30/2016 FINDINGS: Right pleural effusion has improved. No pneumothorax. Vascular congestion. Tiny left pleural effusion. Mild cardiomegaly. IMPRESSION: Improved right pleural effusion Stable vascular congestion. Electronically Signed   By: Marybelle Killings M.D.   On: 12/03/2016 08:33   X-ray Chest Pa And Lateral  Result Date: 11/28/2016 CLINICAL DATA:  Confusion.  History of asthma, cancer, hypertension. EXAM: CHEST  2 VIEW COMPARISON:  08/01/2016 FINDINGS: Diffuse cardiac enlargement. Pulmonary vascular congestion. Interstitial infiltrates in the lungs consistent with interstitial edema. Small bilateral pleural effusions. Atelectasis in the right lung  base. Progression since previous study. Chronic right apical pleural thickening. Fluid in the right minor fissure. Calcified and tortuous aorta. IMPRESSION: Cardiac enlargement with pulmonary vascular congestion, interstitial edema, and small bilateral pleural effusions progressing since prior study. Atelectasis in the right lung base. Electronically Signed   By: Lucienne Capers M.D.   On: 11/28/2016 22:50   Ct Head Wo Contrast  Result Date: 11/28/2016 CLINICAL DATA:  83 y/o  F; confusion. EXAM: CT HEAD WITHOUT CONTRAST TECHNIQUE: Contiguous axial images were obtained from the base of the skull through the vertex without intravenous contrast. COMPARISON:  01/07/2013 CT of the head and MRI of the head. FINDINGS: Brain: Interval left  superior cerebellar chronic appearing infarct. No evidence for large territory infarct, focal mass effect, or intracranial hemorrhage. Stable moderate chronic microvascular ischemic changes in white matter and mild brain parenchymal volume loss. Stable chronic lacunar infarct in the right anterior corona radiata. No hydrocephalus, extra-axial collection, or effacement of basilar cisterns. Vascular: Moderate calcific atherosclerosis of cavernous and paraclinoid internal carotid arteries. Skull: Normal. Negative for fracture or focal lesion. Sinuses/Orbits: Small left maxillary sinus mucous retention cyst. Bilateral intra-ocular lens replacement. Otherwise negative. Other: None. IMPRESSION: 1.  No acute intracranial abnormality identified. 2. Interval chronic appearing infarct in the left superior cerebellar hemisphere from 2014. 3. Stable moderate microvascular ischemic changes and mild brain parenchymal volume loss. Electronically Signed   By: Kristine Garbe M.D.   On: 11/28/2016 22:07   Ct Angio Chest Pe W Or Wo Contrast  Result Date: 11/30/2016 CLINICAL DATA:  Chest pain, hypoxia and tachycardia. History of breast cancer. EXAM: CT ANGIOGRAPHY CHEST WITH CONTRAST TECHNIQUE: Multidetector CT imaging of the chest was performed using the standard protocol during bolus administration of intravenous contrast. Multiplanar CT image reconstructions and MIPs were obtained to evaluate the vascular anatomy. CONTRAST:  75 cc Isovue 370 COMPARISON:  Portable chest obtained earlier today. Chest CT dated 01/30/2009. FINDINGS: Cardiovascular: Normally opacified pulmonary arteries with no pulmonary arterial filling defects seen. Coronary artery and aortic calcifications. Mildly enlarged heart. Mediastinum/Nodes: No enlarged lymph nodes.  Small thyroid gland. Lungs/Pleura: Progressive interstitial prominence involving the anterior right upper lobe. Mild bilateral lower lobe atelectasis. Moderate-sized right pleural  effusion and small left pleural effusion. Upper Abdomen: Refluxed contrast into the inferior vena cava and hepatic veins. Musculoskeletal: Mild thoracic spine degenerative changes. Interval mild upper thoracic vertebral superior endplate compression deformity and associated sclerosis. Minimal bony retropulsion. No acute fracture lines. Review of the MIP images confirms the above findings. IMPRESSION: 1. No pulmonary emboli. 2. Moderately large right pleural effusion and smaller left pleural effusion. 3. Progressive interstitial prominence in the anterior right upper lobe. This may represent progressive radiation fibrosis. 4. Interval old, mild upper thoracic vertebral superior endplate compression deformity and associated sclerosis. Electronically Signed   By: Claudie Revering M.D.   On: 11/30/2016 14:02   Mr Brain Wo Contrast  Result Date: 11/29/2016 CLINICAL DATA:  Acute encephalopathy. History of breast cancer and autoimmune hepatitis. EXAM: MRI HEAD WITHOUT CONTRAST TECHNIQUE: Multiplanar, multiecho pulse sequences of the brain and surrounding structures were obtained without intravenous contrast. COMPARISON:  Head CT 11/28/2016 and MRI 01/07/2013 FINDINGS: Brain: There is no evidence of acute infarct, mass, midline shift, or extra-axial fluid collection. Scattered chronic microhemorrhages are present throughout both cerebral hemispheres, predominantly in peripheral in location and more conspicuous than on the prior MRI which may reflect a true interval increase in number versus increased visibility from differences in technique. There is a chronic  left superior cerebellar infarct which is new from the prior MRI. Patchy T2 hyperintensities in the subcortical and deep cerebral white matter bilaterally have mildly progressed from the prior MRI and are nonspecific but compatible with moderate chronic small vessel ischemic disease. There is mild cerebral atrophy. Vascular: Major intracranial vascular flow voids are  preserved. Skull and upper cervical spine: Unremarkable bone marrow signal. Sinuses/Orbits: Bilateral cataract extraction. Small left maxillary sinus mucous retention cyst. Minimal sphenoid sinus mucosal thickening. No significant mastoid fluid. Other: None. IMPRESSION: 1. No acute intracranial abnormality. 2. Moderate chronic small vessel ischemic disease, progressed from 2014. 3. Chronic left cerebellar infarct. Electronically Signed   By: Logan Bores M.D.   On: 11/29/2016 16:52   Dg Chest Port 1 View  Result Date: 11/30/2016 CLINICAL DATA:  Shortness of breath. EXAM: PORTABLE CHEST 1 VIEW COMPARISON:  11/28/2016. FINDINGS: Stable enlarged cardiac silhouette. Decreased prominence of the pulmonary vasculature and interstitial markings. Increased density at the right lung base with decreased elevation of the right hemidiaphragm stable right apical pleural thickening and adjacent parenchymal scarring. Right pleural fluid and minimal left pleural fluid. Cervical spine degenerative changes. IMPRESSION: 1. Increased right basilar atelectasis or pneumonia and pleural fluid. 2. Minimal left pleural effusion. 3. Stable cardiomegaly. 4. Improved changes of congestive heart failure. 5. Aortic atherosclerosis. Electronically Signed   By: Claudie Revering M.D.   On: 11/30/2016 10:43     PERTINENT LAB RESULTS: CBC:  Recent Labs  12/02/16 0428 12/03/16 0800  WBC 7.9 6.9  HGB 10.3* 10.5*  HCT 35.5* 34.7*  PLT 291 242   CMET CMP     Component Value Date/Time   NA 134 (L) 12/03/2016 0800   K 4.2 12/03/2016 0800   CL 93 (L) 12/03/2016 0800   CO2 30 12/03/2016 0800   GLUCOSE 77 12/03/2016 0800   GLUCOSE 131 (H) 09/18/2006 1420   BUN 26 (H) 12/03/2016 0800   CREATININE 0.81 12/03/2016 0800   CALCIUM 9.1 12/03/2016 0800   PROT 7.8 11/28/2016 2118   ALBUMIN 3.5 11/28/2016 2118   AST 67 (H) 11/28/2016 2118   ALT 23 11/28/2016 2118   ALKPHOS 64 11/28/2016 2118   BILITOT 2.2 (H) 11/28/2016 2118    GFRNONAA >60 12/03/2016 0800   GFRAA >60 12/03/2016 0800    GFR Estimated Creatinine Clearance: 36.9 mL/min (by C-G formula based on SCr of 0.81 mg/dL). No results for input(s): LIPASE, AMYLASE in the last 72 hours. No results for input(s): CKTOTAL, CKMB, CKMBINDEX, TROPONINI in the last 72 hours. Invalid input(s): POCBNP No results for input(s): DDIMER in the last 72 hours. No results for input(s): HGBA1C in the last 72 hours. No results for input(s): CHOL, HDL, LDLCALC, TRIG, CHOLHDL, LDLDIRECT in the last 72 hours. No results for input(s): TSH, T4TOTAL, T3FREE, THYROIDAB in the last 72 hours.  Invalid input(s): FREET3 No results for input(s): VITAMINB12, FOLATE, FERRITIN, TIBC, IRON, RETICCTPCT in the last 72 hours. Coags: No results for input(s): INR in the last 72 hours.  Invalid input(s): PT Microbiology: Recent Results (from the past 240 hour(s))  MRSA PCR Screening     Status: None   Collection Time: 11/28/16  6:54 PM  Result Value Ref Range Status   MRSA by PCR NEGATIVE NEGATIVE Final    Comment:        The GeneXpert MRSA Assay (FDA approved for NASAL specimens only), is one component of a comprehensive MRSA colonization surveillance program. It is not intended to diagnose MRSA infection nor to guide  or monitor treatment for MRSA infections.   Culture, Urine     Status: Abnormal   Collection Time: 11/29/16  3:53 PM  Result Value Ref Range Status   Specimen Description URINE, RANDOM  Final   Special Requests NONE  Final   Culture 80,000 COLONIES/mL ESCHERICHIA COLI (A)  Final   Report Status 12/01/2016 FINAL  Final   Organism ID, Bacteria ESCHERICHIA COLI (A)  Final      Susceptibility   Escherichia coli - MIC*    AMPICILLIN <=2 SENSITIVE Sensitive     CEFAZOLIN <=4 SENSITIVE Sensitive     CEFTRIAXONE <=1 SENSITIVE Sensitive     CIPROFLOXACIN <=0.25 SENSITIVE Sensitive     GENTAMICIN <=1 SENSITIVE Sensitive     IMIPENEM <=0.25 SENSITIVE Sensitive      NITROFURANTOIN <=16 SENSITIVE Sensitive     TRIMETH/SULFA <=20 SENSITIVE Sensitive     AMPICILLIN/SULBACTAM <=2 SENSITIVE Sensitive     PIP/TAZO <=4 SENSITIVE Sensitive     Extended ESBL NEGATIVE Sensitive     * 80,000 COLONIES/mL ESCHERICHIA COLI    FURTHER DISCHARGE INSTRUCTIONS:  Get Medicines reviewed and adjusted: Please take all your medications with you for your next visit with your Primary MD  Laboratory/radiological data: Please request your Primary MD to go over all hospital tests and procedure/radiological results at the follow up, please ask your Primary MD to get all Hospital records sent to his/her office.  In some cases, they will be blood work, cultures and biopsy results pending at the time of your discharge. Please request that your primary care M.D. goes through all the records of your hospital data and follows up on these results.  Also Note the following: If you experience worsening of your admission symptoms, develop shortness of breath, life threatening emergency, suicidal or homicidal thoughts you must seek medical attention immediately by calling 911 or calling your MD immediately  if symptoms less severe.  You must read complete instructions/literature along with all the possible adverse reactions/side effects for all the Medicines you take and that have been prescribed to you. Take any new Medicines after you have completely understood and accpet all the possible adverse reactions/side effects.   Do not drive when taking Pain medications or sleeping medications (Benzodaizepines)  Do not take more than prescribed Pain, Sleep and Anxiety Medications. It is not advisable to combine anxiety,sleep and pain medications without talking with your primary care practitioner  Special Instructions: If you have smoked or chewed Tobacco  in the last 2 yrs please stop smoking, stop any regular Alcohol  and or any Recreational drug use.  Wear Seat belts while  driving.  Please note: You were cared for by a hospitalist during your hospital stay. Once you are discharged, your primary care physician will handle any further medical issues. Please note that NO REFILLS for any discharge medications will be authorized once you are discharged, as it is imperative that you return to your primary care physician (or establish a relationship with a primary care physician if you do not have one) for your post hospital discharge needs so that they can reassess your need for medications and monitor your lab values.  Total Time spent coordinating discharge including counseling, education and face to face time equals 45 minutes.  SignedOren Binet 12/04/2016 11:35 AM

## 2016-12-04 ENCOUNTER — Telehealth: Payer: Self-pay | Admitting: *Deleted

## 2016-12-04 DIAGNOSIS — I5032 Chronic diastolic (congestive) heart failure: Secondary | ICD-10-CM | POA: Diagnosis not present

## 2016-12-04 DIAGNOSIS — I429 Cardiomyopathy, unspecified: Secondary | ICD-10-CM | POA: Diagnosis not present

## 2016-12-04 DIAGNOSIS — M353 Polymyalgia rheumatica: Secondary | ICD-10-CM | POA: Diagnosis not present

## 2016-12-04 DIAGNOSIS — E871 Hypo-osmolality and hyponatremia: Secondary | ICD-10-CM | POA: Diagnosis not present

## 2016-12-04 DIAGNOSIS — Z681 Body mass index (BMI) 19 or less, adult: Secondary | ICD-10-CM | POA: Diagnosis not present

## 2016-12-04 DIAGNOSIS — D508 Other iron deficiency anemias: Secondary | ICD-10-CM | POA: Diagnosis not present

## 2016-12-04 DIAGNOSIS — G9340 Encephalopathy, unspecified: Secondary | ICD-10-CM | POA: Diagnosis not present

## 2016-12-04 DIAGNOSIS — I248 Other forms of acute ischemic heart disease: Secondary | ICD-10-CM | POA: Diagnosis not present

## 2016-12-04 DIAGNOSIS — J9601 Acute respiratory failure with hypoxia: Secondary | ICD-10-CM | POA: Diagnosis not present

## 2016-12-04 DIAGNOSIS — I5043 Acute on chronic combined systolic (congestive) and diastolic (congestive) heart failure: Secondary | ICD-10-CM | POA: Diagnosis present

## 2016-12-04 DIAGNOSIS — G9341 Metabolic encephalopathy: Secondary | ICD-10-CM | POA: Diagnosis not present

## 2016-12-04 DIAGNOSIS — R443 Hallucinations, unspecified: Secondary | ICD-10-CM | POA: Diagnosis not present

## 2016-12-04 DIAGNOSIS — F05 Delirium due to known physiological condition: Secondary | ICD-10-CM | POA: Diagnosis not present

## 2016-12-04 DIAGNOSIS — N390 Urinary tract infection, site not specified: Secondary | ICD-10-CM | POA: Diagnosis not present

## 2016-12-04 DIAGNOSIS — D638 Anemia in other chronic diseases classified elsewhere: Secondary | ICD-10-CM | POA: Diagnosis present

## 2016-12-04 DIAGNOSIS — I959 Hypotension, unspecified: Secondary | ICD-10-CM | POA: Diagnosis not present

## 2016-12-04 DIAGNOSIS — K754 Autoimmune hepatitis: Secondary | ICD-10-CM | POA: Diagnosis present

## 2016-12-04 DIAGNOSIS — I110 Hypertensive heart disease with heart failure: Secondary | ICD-10-CM | POA: Diagnosis not present

## 2016-12-04 DIAGNOSIS — I4891 Unspecified atrial fibrillation: Secondary | ICD-10-CM | POA: Diagnosis not present

## 2016-12-04 DIAGNOSIS — M6281 Muscle weakness (generalized): Secondary | ICD-10-CM | POA: Diagnosis present

## 2016-12-04 DIAGNOSIS — I5033 Acute on chronic diastolic (congestive) heart failure: Secondary | ICD-10-CM | POA: Diagnosis not present

## 2016-12-04 DIAGNOSIS — R5381 Other malaise: Secondary | ICD-10-CM | POA: Diagnosis not present

## 2016-12-04 DIAGNOSIS — E030 Congenital hypothyroidism with diffuse goiter: Secondary | ICD-10-CM | POA: Diagnosis not present

## 2016-12-04 DIAGNOSIS — I10 Essential (primary) hypertension: Secondary | ICD-10-CM | POA: Diagnosis present

## 2016-12-04 DIAGNOSIS — I480 Paroxysmal atrial fibrillation: Secondary | ICD-10-CM | POA: Diagnosis not present

## 2016-12-04 DIAGNOSIS — I481 Persistent atrial fibrillation: Secondary | ICD-10-CM | POA: Diagnosis not present

## 2016-12-04 MED ORDER — FUROSEMIDE 40 MG PO TABS: 40.0000 mg | ORAL_TABLET | Freq: Every day | ORAL | Status: DC

## 2016-12-04 MED ORDER — RISPERIDONE 0.25 MG PO TABS: 0.2500 mg | ORAL_TABLET | Freq: Every day | ORAL | Status: DC

## 2016-12-04 MED ORDER — ALPRAZOLAM 0.5 MG PO TABS: ORAL_TABLET | ORAL | 0 refills | Status: DC

## 2016-12-04 MED ORDER — NITROFURANTOIN MONOHYD MACRO 100 MG PO CAPS: 100.0000 mg | ORAL_CAPSULE | Freq: Two times a day (BID) | ORAL | 0 refills | Status: AC

## 2016-12-04 NOTE — Progress Notes (Signed)
Pt discharged to [facility] in [facility]. Discharge instructions reviewed with pts family and patient. Both parties verbalized understanding. 2 prescriptions given to grand daugther with discharge instructions to be provided to nursing home staff. Pt stable at discharge. Pt in wheelchair with 2 volunteers, granddaughter and belongings taken to private vehicle. Advance directive completed prior to discharge home. 1 PIV removed prior to discharge.

## 2016-12-04 NOTE — Telephone Encounter (Signed)
Pt was on TCM list admitted 3/16 for Acute metabolic encephalopathy. Pt was D/C 3/22, and sent to SNF. Per summary after discharge from SNF will need to f/u w/PCP 1/2 weeks...Johny Chess

## 2016-12-04 NOTE — Telephone Encounter (Signed)
Still currently admitted.

## 2016-12-04 NOTE — Progress Notes (Signed)
SUBJECTIVE: No CP or dyspnea; no palpitations; no syncope  . azaTHIOprine  50 mg Oral Daily  . aztreonam  1 g Intravenous Q12H  . diltiazem  120 mg Oral Daily  . feeding supplement (ENSURE ENLIVE)  237 mL Oral BID BM  . ferrous sulfate  325 mg Oral Q breakfast  . folic acid  1 mg Oral Daily  . furosemide  40 mg Oral Daily  . levothyroxine  75 mcg Oral QAC breakfast  . metoprolol tartrate  50 mg Oral BID  . polyethylene glycol  17 g Oral Daily  . risperiDONE  0.25 mg Oral QHS  . Rivaroxaban  15 mg Oral Q supper  . senna-docusate  1 tablet Oral BID  . sodium chloride flush  3 mL Intravenous Q12H     OBJECTIVE: Physical Exam: Vitals:   12/03/16 2140 12/03/16 2233 12/04/16 0109 12/04/16 0408  BP: 115/90  113/84 112/80  Pulse: (!) 58 (!) 107 (!) 105 77  Resp: 14   16  Temp: 97.4 F (36.3 C)  97.7 F (36.5 C) 97.8 F (36.6 C)  TempSrc: Oral  Oral Oral  SpO2: 93%  100% 99%  Weight:    96 lb 4.8 oz (43.7 kg)  Height:        Intake/Output Summary (Last 24 hours) at 12/04/16 1136 Last data filed at 12/04/16 1059  Gross per 24 hour  Intake              420 ml  Output             1200 ml  Net             -780 ml    Telemetry reveals afib, V rates 100 bpm  GEN- The patient is frail, well appearing, NAD Head- normal Neck- supple Lungs- CTA Heart- irregular GI- soft, NT, ND, + BS Extremities- no edema Neuro- grossly intact  LABS: Basic Metabolic Panel:  Recent Labs  12/02/16 0428 12/03/16 0800  NA 136 134*  K 4.4 4.2  CL 96* 93*  CO2 33* 30  GLUCOSE 90 77  BUN 17 26*  CREATININE 0.81 0.81  CALCIUM 9.4 9.1   CBC:  Recent Labs  12/02/16 0428 12/03/16 0800  WBC 7.9 6.9  HGB 10.3* 10.5*  HCT 35.5* 34.7*  MCV 82.9 82.0  PLT 291 242     ASSESSMENT AND PLAN:   1. Persistent afib She remains in atrial fibrillation. Chads2vasc score is at least 6.  She has been treated with xarelto.  V rates improved. Continue metoprolol and cardizem. Would like to  avoid amiodarone given elevated T4 and thyroid issues. Some of tachycardia likely driven by iatrogenic hyperthyroidism and should improve with reduced dose of synthroid. DC today. Will need TOC appt one week and close fu with Dr Gwenlyn Found.  2. Acute combined systolic/ diastolic dysfunction Echo with EF 35-40. 2 gram sodium diet Strict Is and Os Continue lasix 40 mg po daily; check K and renal function one week.  3. Elevated troponin Denies chest pain,  Likely demand ischemia in nature due to RVR No ischemic ekg changes; no plans for further ischemia eval at this point  4. Hypertension BP borderline; follow closely  5. Cardiomyopathy This is possibly secondary to tachycardia. Continue beta blocker. Blood pressure will not allow an ACE inhibitor. We'll plan to repeat echocardiogram in 3 months once tachycardia is controlled. Could pursue further ischemia evaluation at that point if LV function remains reduced.  6. Iatrogenic  hyperthyroidism Agree with adjustment of Synthroid dose as this is likely driving tachycardia.   Kirk Ruths, MD 12/04/2016 11:36 AM

## 2016-12-04 NOTE — Progress Notes (Signed)
Patient will discharge to [facility] Anticipated discharge date: 12/04/16 Transportation by family Report #: [phone] RN to release once pt has finished lunch and family here to transport  [place] signing off.  Jorge Ny, LCSW Clinical Social Worker [phone]

## 2016-12-04 NOTE — Progress Notes (Signed)
PT Cancellation Note  Patient Details Name: Michele Diaz MRN: [id number] DOB: 08-Jan-1932   Cancelled Treatment:    Reason Eval/Treat Not Completed: Other (comment) Pt scheduled for dc and nurse working on d/c.  Will check back tomorrow in case dc was cancelled.   Galen Manila 12/04/2016, 12:45 PM

## 2016-12-05 DIAGNOSIS — I480 Paroxysmal atrial fibrillation: Secondary | ICD-10-CM | POA: Diagnosis not present

## 2016-12-05 DIAGNOSIS — R5381 Other malaise: Secondary | ICD-10-CM | POA: Diagnosis not present

## 2016-12-05 DIAGNOSIS — I5032 Chronic diastolic (congestive) heart failure: Secondary | ICD-10-CM | POA: Diagnosis not present

## 2016-12-05 DIAGNOSIS — M353 Polymyalgia rheumatica: Secondary | ICD-10-CM | POA: Diagnosis not present

## 2016-12-05 NOTE — Telephone Encounter (Signed)
Lmtcb.

## 2016-12-05 NOTE — Telephone Encounter (Signed)
Left patient msg on home line to call.

## 2016-12-08 NOTE — Telephone Encounter (Signed)
LEFT MESSAGE TO CALL BACK - ASK TO SPEAK TO TRIAGE FOR TOC CALL

## 2016-12-09 NOTE — Telephone Encounter (Signed)
[facility] [phone]  Appt on 12/11/16 at 3pm w/ Kerin Ransom. [facility]-maria(nurse) notified, she will arrange transportation

## 2016-12-11 ENCOUNTER — Ambulatory Visit: Payer: Medicare Other | Admitting: Cardiology

## 2016-12-12 ENCOUNTER — Ambulatory Visit: Payer: Medicare Other | Admitting: Cardiology

## 2017-01-05 DIAGNOSIS — E030 Congenital hypothyroidism with diffuse goiter: Secondary | ICD-10-CM | POA: Diagnosis not present

## 2017-01-05 DIAGNOSIS — I480 Paroxysmal atrial fibrillation: Secondary | ICD-10-CM | POA: Diagnosis not present

## 2017-01-05 DIAGNOSIS — D508 Other iron deficiency anemias: Secondary | ICD-10-CM | POA: Diagnosis not present

## 2017-01-05 DIAGNOSIS — I5032 Chronic diastolic (congestive) heart failure: Secondary | ICD-10-CM | POA: Diagnosis not present

## 2017-01-08 DIAGNOSIS — I5032 Chronic diastolic (congestive) heart failure: Secondary | ICD-10-CM | POA: Diagnosis not present

## 2017-01-08 DIAGNOSIS — I110 Hypertensive heart disease with heart failure: Secondary | ICD-10-CM | POA: Diagnosis not present

## 2017-01-09 DIAGNOSIS — I110 Hypertensive heart disease with heart failure: Secondary | ICD-10-CM | POA: Diagnosis not present

## 2017-01-09 DIAGNOSIS — I5032 Chronic diastolic (congestive) heart failure: Secondary | ICD-10-CM | POA: Diagnosis not present

## 2017-01-12 DIAGNOSIS — I110 Hypertensive heart disease with heart failure: Secondary | ICD-10-CM | POA: Diagnosis not present

## 2017-01-12 DIAGNOSIS — I5032 Chronic diastolic (congestive) heart failure: Secondary | ICD-10-CM | POA: Diagnosis not present

## 2017-01-14 DIAGNOSIS — R5381 Other malaise: Secondary | ICD-10-CM | POA: Diagnosis not present

## 2017-01-14 DIAGNOSIS — I5032 Chronic diastolic (congestive) heart failure: Secondary | ICD-10-CM | POA: Diagnosis not present

## 2017-01-14 DIAGNOSIS — I638 Other cerebral infarction: Secondary | ICD-10-CM | POA: Diagnosis not present

## 2017-01-14 DIAGNOSIS — M353 Polymyalgia rheumatica: Secondary | ICD-10-CM | POA: Diagnosis not present

## 2017-01-14 DIAGNOSIS — E871 Hypo-osmolality and hyponatremia: Secondary | ICD-10-CM | POA: Diagnosis not present

## 2017-01-14 DIAGNOSIS — I110 Hypertensive heart disease with heart failure: Secondary | ICD-10-CM | POA: Diagnosis not present

## 2017-01-18 DIAGNOSIS — I110 Hypertensive heart disease with heart failure: Secondary | ICD-10-CM | POA: Diagnosis not present

## 2017-01-18 DIAGNOSIS — I5032 Chronic diastolic (congestive) heart failure: Secondary | ICD-10-CM | POA: Diagnosis not present

## 2017-01-19 DIAGNOSIS — I5032 Chronic diastolic (congestive) heart failure: Secondary | ICD-10-CM | POA: Diagnosis not present

## 2017-01-19 DIAGNOSIS — I110 Hypertensive heart disease with heart failure: Secondary | ICD-10-CM | POA: Diagnosis not present

## 2017-01-20 DIAGNOSIS — I10 Essential (primary) hypertension: Secondary | ICD-10-CM | POA: Diagnosis not present

## 2017-01-21 DIAGNOSIS — I110 Hypertensive heart disease with heart failure: Secondary | ICD-10-CM | POA: Diagnosis not present

## 2017-01-21 DIAGNOSIS — I5032 Chronic diastolic (congestive) heart failure: Secondary | ICD-10-CM | POA: Diagnosis not present

## 2017-01-22 DIAGNOSIS — I5032 Chronic diastolic (congestive) heart failure: Secondary | ICD-10-CM | POA: Diagnosis not present

## 2017-01-22 DIAGNOSIS — I110 Hypertensive heart disease with heart failure: Secondary | ICD-10-CM | POA: Diagnosis not present

## 2017-01-26 DIAGNOSIS — I5032 Chronic diastolic (congestive) heart failure: Secondary | ICD-10-CM | POA: Diagnosis not present

## 2017-01-26 DIAGNOSIS — I110 Hypertensive heart disease with heart failure: Secondary | ICD-10-CM | POA: Diagnosis not present

## 2017-01-27 DIAGNOSIS — I5032 Chronic diastolic (congestive) heart failure: Secondary | ICD-10-CM | POA: Diagnosis not present

## 2017-01-27 DIAGNOSIS — D649 Anemia, unspecified: Secondary | ICD-10-CM | POA: Diagnosis not present

## 2017-01-27 DIAGNOSIS — E039 Hypothyroidism, unspecified: Secondary | ICD-10-CM | POA: Diagnosis not present

## 2017-01-27 DIAGNOSIS — I110 Hypertensive heart disease with heart failure: Secondary | ICD-10-CM | POA: Diagnosis not present

## 2017-01-27 DIAGNOSIS — I10 Essential (primary) hypertension: Secondary | ICD-10-CM | POA: Diagnosis not present

## 2017-01-28 DIAGNOSIS — R4189 Other symptoms and signs involving cognitive functions and awareness: Secondary | ICD-10-CM | POA: Diagnosis not present

## 2017-01-28 DIAGNOSIS — G4709 Other insomnia: Secondary | ICD-10-CM | POA: Diagnosis not present

## 2017-01-28 DIAGNOSIS — I5032 Chronic diastolic (congestive) heart failure: Secondary | ICD-10-CM | POA: Diagnosis not present

## 2017-01-28 DIAGNOSIS — F418 Other specified anxiety disorders: Secondary | ICD-10-CM | POA: Diagnosis not present

## 2017-01-28 DIAGNOSIS — I110 Hypertensive heart disease with heart failure: Secondary | ICD-10-CM | POA: Diagnosis not present

## 2017-01-29 DIAGNOSIS — I5032 Chronic diastolic (congestive) heart failure: Secondary | ICD-10-CM | POA: Diagnosis not present

## 2017-01-29 DIAGNOSIS — I110 Hypertensive heart disease with heart failure: Secondary | ICD-10-CM | POA: Diagnosis not present

## 2017-02-03 DIAGNOSIS — I5032 Chronic diastolic (congestive) heart failure: Secondary | ICD-10-CM | POA: Diagnosis not present

## 2017-02-03 DIAGNOSIS — I110 Hypertensive heart disease with heart failure: Secondary | ICD-10-CM | POA: Diagnosis not present

## 2017-02-04 DIAGNOSIS — I5032 Chronic diastolic (congestive) heart failure: Secondary | ICD-10-CM | POA: Diagnosis not present

## 2017-02-04 DIAGNOSIS — I110 Hypertensive heart disease with heart failure: Secondary | ICD-10-CM | POA: Diagnosis not present

## 2017-02-05 DIAGNOSIS — I5032 Chronic diastolic (congestive) heart failure: Secondary | ICD-10-CM | POA: Diagnosis not present

## 2017-02-05 DIAGNOSIS — I110 Hypertensive heart disease with heart failure: Secondary | ICD-10-CM | POA: Diagnosis not present

## 2017-02-06 ENCOUNTER — Encounter: Payer: Self-pay | Admitting: Internal Medicine

## 2017-02-06 ENCOUNTER — Ambulatory Visit (INDEPENDENT_AMBULATORY_CARE_PROVIDER_SITE_OTHER): Payer: Medicare Other | Admitting: Internal Medicine

## 2017-02-06 ENCOUNTER — Other Ambulatory Visit (INDEPENDENT_AMBULATORY_CARE_PROVIDER_SITE_OTHER): Payer: Medicare Other

## 2017-02-06 DIAGNOSIS — I6349 Cerebral infarction due to embolism of other cerebral artery: Secondary | ICD-10-CM

## 2017-02-06 DIAGNOSIS — R413 Other amnesia: Secondary | ICD-10-CM | POA: Diagnosis not present

## 2017-02-06 DIAGNOSIS — D509 Iron deficiency anemia, unspecified: Secondary | ICD-10-CM

## 2017-02-06 DIAGNOSIS — Z8673 Personal history of transient ischemic attack (TIA), and cerebral infarction without residual deficits: Secondary | ICD-10-CM | POA: Diagnosis not present

## 2017-02-06 DIAGNOSIS — M353 Polymyalgia rheumatica: Secondary | ICD-10-CM

## 2017-02-06 DIAGNOSIS — K754 Autoimmune hepatitis: Secondary | ICD-10-CM

## 2017-02-06 DIAGNOSIS — I4891 Unspecified atrial fibrillation: Secondary | ICD-10-CM

## 2017-02-06 DIAGNOSIS — I10 Essential (primary) hypertension: Secondary | ICD-10-CM

## 2017-02-06 LAB — BASIC METABOLIC PANEL
BUN: 19 mg/dL (ref 6–23)
CHLORIDE: 95 meq/L — AB (ref 96–112)
CO2: 32 meq/L (ref 19–32)
CREATININE: 1.03 mg/dL (ref 0.40–1.20)
Calcium: 9.5 mg/dL (ref 8.4–10.5)
GFR: 54.47 mL/min — ABNORMAL LOW (ref >60.00)
GLUCOSE: 88 mg/dL (ref 70–99)
POTASSIUM: 4.6 meq/L (ref 3.5–5.1)
Sodium: 131 mEq/L — ABNORMAL LOW (ref 135–145)

## 2017-02-06 LAB — CBC WITH DIFFERENTIAL/PLATELET
BASOS PCT: 0.7 % (ref 0.0–3.0)
Basophils Absolute: 0 10*3/uL (ref 0.0–0.1)
EOS PCT: 3.1 % (ref 0.0–5.0)
Eosinophils Absolute: 0.2 10*3/uL (ref 0.0–0.7)
HCT: 35.8 % — ABNORMAL LOW (ref 36.0–46.0)
HEMOGLOBIN: 11.7 g/dL — AB (ref 12.0–15.0)
LYMPHS ABS: 0.5 10*3/uL — AB (ref 0.7–4.0)
Lymphocytes Relative: 7.5 % — ABNORMAL LOW (ref 12.0–46.0)
MCHC: 32.7 g/dL (ref 30.0–36.0)
MCV: 86.9 fl (ref 78.0–100.0)
Monocytes Absolute: 0.6 10*3/uL (ref 0.1–1.0)
Monocytes Relative: 9.7 % (ref 3.0–12.0)
NEUTROS ABS: 5.3 10*3/uL (ref 1.4–7.7)
Neutrophils Relative %: 79 % — ABNORMAL HIGH (ref 43.0–77.0)
PLATELETS: 224 10*3/uL (ref 150.0–400.0)
RBC: 4.12 Mil/uL (ref 3.87–5.11)
RDW: 22.6 % — AB (ref 11.5–15.5)
WBC: 6.7 10*3/uL (ref 4.0–10.5)

## 2017-02-06 LAB — HEPATIC FUNCTION PANEL
ALBUMIN: 4 g/dL (ref 3.5–5.2)
ALK PHOS: 94 U/L (ref 39–117)
ALT: 8 U/L (ref 0–35)
AST: 22 U/L (ref 0–37)
BILIRUBIN DIRECT: 0.2 mg/dL (ref 0.0–0.3)
TOTAL PROTEIN: 8.4 g/dL — AB (ref 6.0–8.3)
Total Bilirubin: 1 mg/dL (ref 0.2–1.2)

## 2017-02-06 LAB — URINALYSIS
Bilirubin Urine: NEGATIVE
Hgb urine dipstick: NEGATIVE
KETONES UR: NEGATIVE
Leukocytes, UA: NEGATIVE
Nitrite: NEGATIVE
SPECIFIC GRAVITY, URINE: 1.015 (ref 1.000–1.030)
TOTAL PROTEIN, URINE-UPE24: NEGATIVE
URINE GLUCOSE: NEGATIVE
Urobilinogen, UA: 2 — AB (ref 0.0–1.0)
pH: 6.5 (ref 5.0–8.0)

## 2017-02-06 LAB — SEDIMENTATION RATE: Sed Rate: 47 mm/hr — ABNORMAL HIGH (ref 0–30)

## 2017-02-06 LAB — IBC PANEL
IRON: 96 ug/dL (ref 42–145)
Saturation Ratios: 23.6 % (ref 20.0–50.0)
Transferrin: 291 mg/dL (ref 212.0–360.0)

## 2017-02-06 NOTE — Assessment & Plan Note (Signed)
Labs

## 2017-02-06 NOTE — Progress Notes (Addendum)
Subjective:  Patient ID: Michele Diaz, female    DOB: 1932-01-05  Age: 83 y.o. MRN: [id number]  CC: No chief complaint on file.   HPI Michele Diaz presents for CHF, memory loss, depression She comes w/a family (her son and her GD). She is at the assisted living place now [facility]There is a concern that the pt should not be returning to live by herself at her home after a d/c since she was having periods of confusion and FTT issues prior to her recent hospital admission. The pt is adamant about going back home after her d/c. Her MMS score was 20/30 on 01/29/17  Outpatient Medications Prior to Visit  Medication Sig Dispense Refill  . albuterol (PROVENTIL HFA;VENTOLIN HFA) 108 (90 Base) MCG/ACT inhaler Inhale 1-2 puffs into the lungs every 6 (six) hours as needed for wheezing or shortness of breath. 18 g 11  . ALPRAZolam (XANAX) 0.5 MG tablet TAKE 1/2 TABLET TWICE A DAY BY MOUTH AS NEEDED FOR ANXIETY 10 tablet 0  . azaTHIOprine (IMURAN) 50 MG tablet TAKE 1 TABLET (50 MG TOTAL) BY MOUTH DAILY. 90 tablet 2  . cholecalciferol (VITAMIN D) 1000 UNITS tablet Take 1 tablet (1,000 Units total) by mouth daily. 30 tablet 1  . diltiazem (CARDIZEM CD) 120 MG 24 hr capsule Take 1 capsule (120 mg total) by mouth daily.    Marland Kitchen escitalopram (LEXAPRO) 10 MG tablet TAKE 1 TABLET BY MOUTH DAILY 30 tablet 5  . feeding supplement, ENSURE ENLIVE, (ENSURE ENLIVE) LIQD Take 237 mLs by mouth 2 (two) times daily between meals. 237 mL 12  . ferrous sulfate 325 (65 FE) MG tablet Take 1 tablet (325 mg total) by mouth daily with breakfast.  3  . folic acid (FOLVITE) 1 MG tablet Take 1 tablet (1 mg total) by mouth daily.    . furosemide (LASIX) 40 MG tablet Take 1 tablet (40 mg total) by mouth daily.    Marland Kitchen levothyroxine (SYNTHROID, LEVOTHROID) 75 MCG tablet Take 1 tablet (75 mcg total) by mouth daily before breakfast.    . meclizine (ANTIVERT) 12.5 MG tablet Take 1 tablet (12.5 mg total) by  mouth 2 (two) times daily as needed for dizziness. 180 tablet 1  . metoprolol tartrate (LOPRESSOR) 25 MG tablet Take 2 tablets (50 mg total) by mouth 2 (two) times daily. 60 tablet 11  . pantoprazole (PROTONIX) 40 MG tablet Take 1 tablet (40 mg total) by mouth daily. 90 tablet 2  . polyethylene glycol (MIRALAX / GLYCOLAX) packet Take 17 g by mouth daily. 14 each 0  . potassium chloride (K-DUR) 10 MEQ tablet Take 2 tablets (20 mEq total) by mouth daily. 30 tablet 0  . risperiDONE (RISPERDAL) 0.25 MG tablet Take 1 tablet (0.25 mg total) by mouth at bedtime.    . Rivaroxaban (XARELTO) 15 MG TABS tablet Take 1 tablet (15 mg total) by mouth daily. (Patient taking differently: Take 15 mg by mouth daily with supper. ) 30 tablet 11   No facility-administered medications prior to visit.     ROS Review of Systems  Constitutional: Positive for fatigue. Negative for activity change, appetite change, chills and unexpected weight change.  HENT: Negative for congestion, mouth sores and sinus pressure.   Eyes: Negative for visual disturbance.  Respiratory: Negative for cough and chest tightness.   Gastrointestinal: Negative for abdominal pain and nausea.  Genitourinary: Negative for difficulty urinating, frequency and vaginal pain.  Musculoskeletal: Negative for back pain and gait  problem.  Skin: Negative for pallor and rash.  Neurological: Negative for dizziness, tremors, weakness, numbness and headaches.  Psychiatric/Behavioral: Positive for decreased concentration. Negative for confusion, hallucinations, sleep disturbance and suicidal ideas. The patient is not nervous/anxious.     Objective:  BP 118/78 (BP Location: Left Arm, Patient Position: Sitting, Cuff Size: Normal)   Pulse 66   Temp 97.6 F (36.4 C) (Oral)   Ht 5\' 2"  (1.575 m)   Wt 111 lb (50.3 kg)   SpO2 97%   BMI 20.30 kg/m   BP Readings from Last 3 Encounters:  02/06/17 118/78  12/04/16 103/72  11/28/16 132/78    Wt Readings  from Last 3 Encounters:  02/06/17 111 lb (50.3 kg)  12/04/16 96 lb 4.8 oz (43.7 kg)  11/28/16 108 lb (49 kg)    Physical Exam  Constitutional: She appears well-developed. No distress.  HENT:  Head: Normocephalic.  Right Ear: External ear normal.  Left Ear: External ear normal.  Nose: Nose normal.  Mouth/Throat: Oropharynx is clear and moist.  Eyes: Conjunctivae are normal. Pupils are equal, round, and reactive to light. Right eye exhibits no discharge. Left eye exhibits no discharge.  Neck: Normal range of motion. Neck supple. No JVD present. No tracheal deviation present. No thyromegaly present.  Cardiovascular: Normal rate, regular rhythm and normal heart sounds.   Pulmonary/Chest: No stridor. No respiratory distress. She has no wheezes.  Abdominal: Soft. Bowel sounds are normal. She exhibits no distension and no mass. There is no tenderness. There is no rebound and no guarding.  Musculoskeletal: She exhibits no edema or tenderness.  Lymphadenopathy:    She has no cervical adenopathy.  Neurological: She displays normal reflexes. No cranial nerve deficit. She exhibits normal muscle tone. Coordination abnormal.  Skin: No rash noted. No erythema.  Psychiatric: She has a normal mood and affect. Her behavior is normal. Judgment and thought content normal.  Thin slightely ataxic  MMS score is 26/30  I spent total of 45 minutes face-to-face with patient and greater than 50% was spent counseling and or coordinating care - we discussed her progress, memory issues and d/c dispositionoptions  Lab Results  Component Value Date   WBC 6.9 12/03/2016   HGB 10.5 (L) 12/03/2016   HCT 34.7 (L) 12/03/2016   PLT 242 12/03/2016   GLUCOSE 77 12/03/2016   CHOL 178 01/08/2013   TRIG 226 (H) 01/08/2013   HDL 58 01/08/2013   LDLDIRECT 125.9 10/06/2007   LDLCALC 75 01/08/2013   ALT 23 11/28/2016   AST 67 (H) 11/28/2016   NA 134 (L) 12/03/2016   K 4.2 12/03/2016   CL 93 (L) 12/03/2016    CREATININE 0.81 12/03/2016   BUN 26 (H) 12/03/2016   CO2 30 12/03/2016   TSH 0.492 11/28/2016   INR 1.60 11/28/2016   HGBA1C 5.3 01/08/2013    X-ray Chest Pa And Lateral  Result Date: 11/28/2016 CLINICAL DATA:  Confusion.  History of asthma, cancer, hypertension. EXAM: CHEST  2 VIEW COMPARISON:  08/01/2016 FINDINGS: Diffuse cardiac enlargement. Pulmonary vascular congestion. Interstitial infiltrates in the lungs consistent with interstitial edema. Small bilateral pleural effusions. Atelectasis in the right lung base. Progression since previous study. Chronic right apical pleural thickening. Fluid in the right minor fissure. Calcified and tortuous aorta. IMPRESSION: Cardiac enlargement with pulmonary vascular congestion, interstitial edema, and small bilateral pleural effusions progressing since prior study. Atelectasis in the right lung base. Electronically Signed   By: Lucienne Capers M.D.   On: 11/28/2016 22:50  Ct Head Wo Contrast  Result Date: 11/28/2016 CLINICAL DATA:  83 y/o  F; confusion. EXAM: CT HEAD WITHOUT CONTRAST TECHNIQUE: Contiguous axial images were obtained from the base of the skull through the vertex without intravenous contrast. COMPARISON:  01/07/2013 CT of the head and MRI of the head. FINDINGS: Brain: Interval left superior cerebellar chronic appearing infarct. No evidence for large territory infarct, focal mass effect, or intracranial hemorrhage. Stable moderate chronic microvascular ischemic changes in white matter and mild brain parenchymal volume loss. Stable chronic lacunar infarct in the right anterior corona radiata. No hydrocephalus, extra-axial collection, or effacement of basilar cisterns. Vascular: Moderate calcific atherosclerosis of cavernous and paraclinoid internal carotid arteries. Skull: Normal. Negative for fracture or focal lesion. Sinuses/Orbits: Small left maxillary sinus mucous retention cyst. Bilateral intra-ocular lens replacement. Otherwise negative.  Other: None. IMPRESSION: 1.  No acute intracranial abnormality identified. 2. Interval chronic appearing infarct in the left superior cerebellar hemisphere from 2014. 3. Stable moderate microvascular ischemic changes and mild brain parenchymal volume loss. Electronically Signed   By: Kristine Garbe M.D.   On: 11/28/2016 22:07   Mr Brain Wo Contrast  Result Date: 11/29/2016 CLINICAL DATA:  Acute encephalopathy. History of breast cancer and autoimmune hepatitis. EXAM: MRI HEAD WITHOUT CONTRAST TECHNIQUE: Multiplanar, multiecho pulse sequences of the brain and surrounding structures were obtained without intravenous contrast. COMPARISON:  Head CT 11/28/2016 and MRI 01/07/2013 FINDINGS: Brain: There is no evidence of acute infarct, mass, midline shift, or extra-axial fluid collection. Scattered chronic microhemorrhages are present throughout both cerebral hemispheres, predominantly in peripheral in location and more conspicuous than on the prior MRI which may reflect a true interval increase in number versus increased visibility from differences in technique. There is a chronic left superior cerebellar infarct which is new from the prior MRI. Patchy T2 hyperintensities in the subcortical and deep cerebral white matter bilaterally have mildly progressed from the prior MRI and are nonspecific but compatible with moderate chronic small vessel ischemic disease. There is mild cerebral atrophy. Vascular: Major intracranial vascular flow voids are preserved. Skull and upper cervical spine: Unremarkable bone marrow signal. Sinuses/Orbits: Bilateral cataract extraction. Small left maxillary sinus mucous retention cyst. Minimal sphenoid sinus mucosal thickening. No significant mastoid fluid. Other: None. IMPRESSION: 1. No acute intracranial abnormality. 2. Moderate chronic small vessel ischemic disease, progressed from 2014. 3. Chronic left cerebellar infarct. Electronically Signed   By: Logan Bores M.D.   On:  11/29/2016 16:52    Assessment & Plan:   There are no diagnoses linked to this encounter. I am having [name] maintain her cholecalciferol, meclizine, pantoprazole, escitalopram, Rivaroxaban, albuterol, azaTHIOprine, diltiazem, levothyroxine, metoprolol tartrate, potassium chloride, feeding supplement (ENSURE ENLIVE), ferrous sulfate, folic acid, polyethylene glycol, ALPRAZolam, furosemide, and risperiDONE.  No orders of the defined types were placed in this encounter.    Follow-up: No Follow-up on file.  Walker Kehr, MD

## 2017-02-11 DIAGNOSIS — I110 Hypertensive heart disease with heart failure: Secondary | ICD-10-CM | POA: Diagnosis not present

## 2017-02-11 DIAGNOSIS — I5032 Chronic diastolic (congestive) heart failure: Secondary | ICD-10-CM | POA: Diagnosis not present

## 2017-02-15 NOTE — Assessment & Plan Note (Addendum)
Her MMS score was 20/30 on 01/29/17 5/18 She comes w/a family (her son and her GD). She is at the assisted living place now [facility]There is a concern that the pt should not be returning to live by herself at her home after a d/c since she was having periods of confusion and FTT issues prior to her recent hospital admission. MMS today 26/30 The pt seems to be mentally competent to make her medical and d/c decisions at the moment. She wants to be d/c'd home. Her family is very supportive. They'll cont to discuss future plans for senior living. Will re-evaluate in 1 month.

## 2017-02-15 NOTE — Assessment & Plan Note (Signed)
On Imuran 

## 2017-02-15 NOTE — Assessment & Plan Note (Signed)
On Losartan, Cardizem CD, Lopressor NAS diet

## 2017-02-15 NOTE — Assessment & Plan Note (Signed)
No relapse Xarelto, BP meds

## 2017-03-11 ENCOUNTER — Ambulatory Visit: Payer: Medicare Other | Admitting: Internal Medicine

## 2017-03-11 DIAGNOSIS — F0280 Dementia in other diseases classified elsewhere without behavioral disturbance: Secondary | ICD-10-CM | POA: Diagnosis not present

## 2017-03-11 DIAGNOSIS — I5032 Chronic diastolic (congestive) heart failure: Secondary | ICD-10-CM | POA: Diagnosis not present

## 2017-03-11 DIAGNOSIS — D6489 Other specified anemias: Secondary | ICD-10-CM | POA: Diagnosis not present

## 2017-03-20 ENCOUNTER — Ambulatory Visit: Payer: Medicare Other | Admitting: Internal Medicine

## 2017-04-08 ENCOUNTER — Ambulatory Visit: Payer: Medicare Other | Admitting: Internal Medicine

## 2017-05-06 ENCOUNTER — Encounter: Payer: Self-pay | Admitting: Internal Medicine

## 2017-05-06 ENCOUNTER — Ambulatory Visit (INDEPENDENT_AMBULATORY_CARE_PROVIDER_SITE_OTHER): Payer: Medicare Other | Admitting: Internal Medicine

## 2017-05-06 DIAGNOSIS — I6349 Cerebral infarction due to embolism of other cerebral artery: Secondary | ICD-10-CM

## 2017-05-06 DIAGNOSIS — E039 Hypothyroidism, unspecified: Secondary | ICD-10-CM

## 2017-05-06 DIAGNOSIS — I509 Heart failure, unspecified: Secondary | ICD-10-CM | POA: Diagnosis not present

## 2017-05-06 DIAGNOSIS — R410 Disorientation, unspecified: Secondary | ICD-10-CM | POA: Diagnosis not present

## 2017-05-06 DIAGNOSIS — R413 Other amnesia: Secondary | ICD-10-CM | POA: Diagnosis not present

## 2017-05-06 DIAGNOSIS — I5033 Acute on chronic diastolic (congestive) heart failure: Secondary | ICD-10-CM

## 2017-05-06 NOTE — Assessment & Plan Note (Signed)
8/18 he was d/c'd from Assisted living to home. [place] RN 3/week

## 2017-05-06 NOTE — Assessment & Plan Note (Addendum)
Mild dementia She was d/c'd from [facility] living to home. [facility] RN 3/week RTC 2 months

## 2017-05-06 NOTE — Assessment & Plan Note (Signed)
On Levothroid 

## 2017-05-06 NOTE — Assessment & Plan Note (Signed)
Labs

## 2017-05-06 NOTE — Assessment & Plan Note (Signed)
She was d/c'd from Assisted living to home. [place] RN 3/week

## 2017-05-06 NOTE — Progress Notes (Signed)
Subjective:  Patient ID: Michele Diaz, female    DOB: 01/21/1932  Age: 83 y.o. MRN: [id number]  CC: No chief complaint on file.   HPI SHANTEL HELWIG presents for HTN, depression, anxiety f/u She was d/c'd from [place] living to home. 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 azaTHIOprine (IMURAN) 50 MG tablet TAKE 1 TABLET (50 MG TOTAL) BY MOUTH DAILY. 90 tablet 2  . cholecalciferol (VITAMIN D) 1000 UNITS tablet Take 1 tablet (1,000 Units total) by mouth daily. 30 tablet 1  . diltiazem (CARDIZEM CD) 120 MG 24 hr capsule Take 1 capsule (120 mg total) by mouth daily.    Marland Kitchen escitalopram (LEXAPRO) 10 MG tablet TAKE 1 TABLET BY MOUTH DAILY 30 tablet 5  . ferrous sulfate 325 (65 FE) MG tablet Take 1 tablet (325 mg total) by mouth daily with breakfast.  3  . folic acid (FOLVITE) 1 MG tablet Take 1 tablet (1 mg total) by mouth daily.    . furosemide (LASIX) 40 MG tablet Take 1 tablet (40 mg total) by mouth daily.    Marland Kitchen levothyroxine (SYNTHROID, LEVOTHROID) 75 MCG tablet Take 1 tablet (75 mcg total) by mouth daily before breakfast.    . metoprolol tartrate (LOPRESSOR) 25 MG tablet Take 2 tablets (50 mg total) by mouth 2 (two) times daily. 60 tablet 11  . pantoprazole (PROTONIX) 40 MG tablet Take 1 tablet (40 mg total) by mouth daily. 90 tablet 2  . potassium chloride (K-DUR) 10 MEQ tablet Take 2 tablets (20 mEq total) by mouth daily. 30 tablet 0  . risperiDONE (RISPERDAL) 0.25 MG tablet Take 1 tablet (0.25 mg total) by mouth at bedtime.    . Rivaroxaban (XARELTO) 15 MG TABS tablet Take 1 tablet (15 mg total) by mouth daily. (Patient taking differently: Take 15 mg by mouth daily with supper. ) 30 tablet 11  . albuterol (PROVENTIL HFA;VENTOLIN HFA) 108 (90 Base) MCG/ACT inhaler Inhale 1-2 puffs into the lungs every 6 (six) hours as needed for wheezing or shortness of breath. (Patient not taking: Reported on  05/06/2017) 18 g 11  . ALPRAZolam (XANAX) 0.5 MG tablet TAKE 1/2 TABLET TWICE A DAY BY MOUTH AS NEEDED FOR ANXIETY (Patient not taking: Reported on 05/06/2017) 10 tablet 0  . feeding supplement, ENSURE ENLIVE, (ENSURE ENLIVE) LIQD Take 237 mLs by mouth 2 (two) times daily between meals. (Patient not taking: Reported on 05/06/2017) 237 mL 12  . meclizine (ANTIVERT) 12.5 MG tablet Take 1 tablet (12.5 mg total) by mouth 2 (two) times daily as needed for dizziness. (Patient not taking: Reported on 05/06/2017) 180 tablet 1  . polyethylene glycol (MIRALAX / GLYCOLAX) packet Take 17 g by mouth daily. (Patient not taking: Reported on 05/06/2017) 14 each 0   No facility-administered medications prior to visit.     ROS Review of Systems  Constitutional: Positive for fatigue. Negative for activity change, appetite change, chills and unexpected weight change.  HENT: Negative for congestion, mouth sores and sinus pressure.   Eyes: Negative for visual disturbance.  Respiratory: Negative for cough and chest tightness.   Gastrointestinal: Negative for abdominal pain and nausea.  Genitourinary: Negative for difficulty urinating, frequency and vaginal pain.  Musculoskeletal: Negative for back pain and gait problem.  Skin: Negative for pallor and rash.  Neurological: Negative for dizziness, tremors, weakness, numbness and headaches.  Psychiatric/Behavioral: Positive for decreased concentration. Negative for confusion  and sleep disturbance. The patient is nervous/anxious.     Objective:  BP 120/72 (BP Location: Left Arm, Patient Position: Sitting, Cuff Size: Normal)   Pulse 69   Temp 98 F (36.7 C) (Oral)   Ht 5\' 2"  (1.575 m)   Wt 117 lb (53.1 kg)   SpO2 98%   BMI 21.40 kg/m   BP Readings from Last 3 Encounters:  05/06/17 120/72  02/06/17 118/78  12/04/16 103/72    Wt Readings from Last 3 Encounters:  05/06/17 117 lb (53.1 kg)  02/06/17 111 lb (50.3 kg)  12/04/16 96 lb 4.8 oz (43.7 kg)     Physical Exam  Constitutional: She appears well-developed. No distress.  HENT:  Head: Normocephalic.  Right Ear: External ear normal.  Left Ear: External ear normal.  Nose: Nose normal.  Mouth/Throat: Oropharynx is clear and moist.  Eyes: Pupils are equal, round, and reactive to light. Conjunctivae are normal. Right eye exhibits no discharge. Left eye exhibits no discharge.  Neck: Normal range of motion. Neck supple. No JVD present. No tracheal deviation present. No thyromegaly present.  Cardiovascular: Normal rate, regular rhythm and normal heart sounds.   Pulmonary/Chest: No stridor. No respiratory distress. She has no wheezes.  Abdominal: Soft. Bowel sounds are normal. She exhibits no distension and no mass. There is no tenderness. There is no rebound and no guarding.  Musculoskeletal: She exhibits no edema or tenderness.  Lymphadenopathy:    She has no cervical adenopathy.  Neurological: She displays normal reflexes. No cranial nerve deficit. She exhibits normal muscle tone. Coordination abnormal.  Skin: No rash noted. No erythema.  Psychiatric: She has a normal mood and affect. Her behavior is normal. Judgment and thought content normal.    Lab Results  Component Value Date   WBC 6.7 02/06/2017   HGB 11.7 (L) 02/06/2017   HCT 35.8 (L) 02/06/2017   PLT 224.0 02/06/2017   GLUCOSE 88 02/06/2017   CHOL 178 01/08/2013   TRIG 226 (H) 01/08/2013   HDL 58 01/08/2013   LDLDIRECT 125.9 10/06/2007   LDLCALC 75 01/08/2013   ALT 8 02/06/2017   AST 22 02/06/2017   NA 131 (L) 02/06/2017   K 4.6 02/06/2017   CL 95 (L) 02/06/2017   CREATININE 1.03 02/06/2017   BUN 19 02/06/2017   CO2 32 02/06/2017   TSH 0.492 11/28/2016   INR 1.60 11/28/2016   HGBA1C 5.3 01/08/2013    X-ray Chest Pa And Lateral  Result Date: 11/28/2016 CLINICAL DATA:  Confusion.  History of asthma, cancer, hypertension. EXAM: CHEST  2 VIEW COMPARISON:  08/01/2016 FINDINGS: Diffuse cardiac enlargement.  Pulmonary vascular congestion. Interstitial infiltrates in the lungs consistent with interstitial edema. Small bilateral pleural effusions. Atelectasis in the right lung base. Progression since previous study. Chronic right apical pleural thickening. Fluid in the right minor fissure. Calcified and tortuous aorta. IMPRESSION: Cardiac enlargement with pulmonary vascular congestion, interstitial edema, and small bilateral pleural effusions progressing since prior study. Atelectasis in the right lung base. Electronically Signed   By: Lucienne Capers M.D.   On: 11/28/2016 22:50   Ct Head Wo Contrast  Result Date: 11/28/2016 CLINICAL DATA:  83 y/o  F; confusion. EXAM: CT HEAD WITHOUT CONTRAST TECHNIQUE: Contiguous axial images were obtained from the base of the skull through the vertex without intravenous contrast. COMPARISON:  01/07/2013 CT of the head and MRI of the head. FINDINGS: Brain: Interval left superior cerebellar chronic appearing infarct. No evidence for large territory infarct, focal mass effect, or  intracranial hemorrhage. Stable moderate chronic microvascular ischemic changes in white matter and mild brain parenchymal volume loss. Stable chronic lacunar infarct in the right anterior corona radiata. No hydrocephalus, extra-axial collection, or effacement of basilar cisterns. Vascular: Moderate calcific atherosclerosis of cavernous and paraclinoid internal carotid arteries. Skull: Normal. Negative for fracture or focal lesion. Sinuses/Orbits: Small left maxillary sinus mucous retention cyst. Bilateral intra-ocular lens replacement. Otherwise negative. Other: None. IMPRESSION: 1.  No acute intracranial abnormality identified. 2. Interval chronic appearing infarct in the left superior cerebellar hemisphere from 2014. 3. Stable moderate microvascular ischemic changes and mild brain parenchymal volume loss. Electronically Signed   By: Kristine Garbe M.D.   On: 11/28/2016 22:07   Mr Brain Wo  Contrast  Result Date: 11/29/2016 CLINICAL DATA:  Acute encephalopathy. History of breast cancer and autoimmune hepatitis. EXAM: MRI HEAD WITHOUT CONTRAST TECHNIQUE: Multiplanar, multiecho pulse sequences of the brain and surrounding structures were obtained without intravenous contrast. COMPARISON:  Head CT 11/28/2016 and MRI 01/07/2013 FINDINGS: Brain: There is no evidence of acute infarct, mass, midline shift, or extra-axial fluid collection. Scattered chronic microhemorrhages are present throughout both cerebral hemispheres, predominantly in peripheral in location and more conspicuous than on the prior MRI which may reflect a true interval increase in number versus increased visibility from differences in technique. There is a chronic left superior cerebellar infarct which is new from the prior MRI. Patchy T2 hyperintensities in the subcortical and deep cerebral white matter bilaterally have mildly progressed from the prior MRI and are nonspecific but compatible with moderate chronic small vessel ischemic disease. There is mild cerebral atrophy. Vascular: Major intracranial vascular flow voids are preserved. Skull and upper cervical spine: Unremarkable bone marrow signal. Sinuses/Orbits: Bilateral cataract extraction. Small left maxillary sinus mucous retention cyst. Minimal sphenoid sinus mucosal thickening. No significant mastoid fluid. Other: None. IMPRESSION: 1. No acute intracranial abnormality. 2. Moderate chronic small vessel ischemic disease, progressed from 2014. 3. Chronic left cerebellar infarct. Electronically Signed   By: Logan Bores M.D.   On: 11/29/2016 16:52    Assessment & Plan:   There are no diagnoses linked to this encounter. I am having [name] maintain her cholecalciferol, meclizine, pantoprazole, escitalopram, Rivaroxaban, albuterol, azaTHIOprine, diltiazem, levothyroxine, metoprolol tartrate, potassium chloride, feeding supplement (ENSURE ENLIVE), ferrous sulfate, folic acid,  polyethylene glycol, ALPRAZolam, furosemide, risperiDONE, and memantine.  Meds ordered this encounter  Medications  . memantine (NAMENDA) 10 MG tablet     Follow-up: No Follow-up on file.  Walker Kehr, MD

## 2017-05-07 ENCOUNTER — Telehealth: Payer: Self-pay | Admitting: Internal Medicine

## 2017-05-07 DIAGNOSIS — E034 Atrophy of thyroid (acquired): Secondary | ICD-10-CM

## 2017-05-07 DIAGNOSIS — I5032 Chronic diastolic (congestive) heart failure: Secondary | ICD-10-CM

## 2017-05-07 DIAGNOSIS — R0609 Other forms of dyspnea: Secondary | ICD-10-CM

## 2017-05-07 DIAGNOSIS — I4891 Unspecified atrial fibrillation: Secondary | ICD-10-CM

## 2017-05-07 DIAGNOSIS — I4819 Other persistent atrial fibrillation: Secondary | ICD-10-CM

## 2017-05-07 MED ORDER — LEVOTHYROXINE SODIUM 75 MCG PO TABS: 75.0000 ug | ORAL_TABLET | Freq: Every day | ORAL | 1 refills | Status: AC

## 2017-05-07 MED ORDER — METOPROLOL TARTRATE 25 MG PO TABS: 50.0000 mg | ORAL_TABLET | Freq: Two times a day (BID) | ORAL | 1 refills | Status: DC

## 2017-05-07 MED ORDER — POTASSIUM CHLORIDE ER 10 MEQ PO TBCR: 20.0000 meq | EXTENDED_RELEASE_TABLET | Freq: Every day | ORAL | 1 refills | Status: DC

## 2017-05-07 MED ORDER — DILTIAZEM HCL ER COATED BEADS 120 MG PO CP24: 120.0000 mg | ORAL_CAPSULE | Freq: Every day | ORAL | 1 refills | Status: AC

## 2017-05-07 MED ORDER — FOLIC ACID 1 MG PO TABS: 1.0000 mg | ORAL_TABLET | Freq: Every day | ORAL | 1 refills | Status: AC

## 2017-05-07 MED ORDER — RIVAROXABAN 15 MG PO TABS: 15.0000 mg | ORAL_TABLET | Freq: Every day | ORAL | 1 refills | Status: DC

## 2017-05-07 MED ORDER — PANTOPRAZOLE SODIUM 40 MG PO TBEC: 40.0000 mg | DELAYED_RELEASE_TABLET | Freq: Every day | ORAL | 1 refills | Status: AC

## 2017-05-07 MED ORDER — FUROSEMIDE 40 MG PO TABS: 40.0000 mg | ORAL_TABLET | Freq: Every day | ORAL | 1 refills | Status: DC

## 2017-05-07 MED ORDER — ESCITALOPRAM OXALATE 10 MG PO TABS: 10.0000 mg | ORAL_TABLET | Freq: Every day | ORAL | 1 refills | Status: AC

## 2017-05-07 MED ORDER — MEMANTINE HCL 10 MG PO TABS: 10.0000 mg | ORAL_TABLET | Freq: Every day | ORAL | 1 refills | Status: AC

## 2017-05-07 NOTE — Telephone Encounter (Signed)
RX sent

## 2017-05-07 NOTE — Telephone Encounter (Signed)
Per chart refills already sent as this am by MD assistant...Johny Chess

## 2017-05-07 NOTE — Telephone Encounter (Addendum)
The pt's daughter in law called stating that they went to the pharmacy to pick up prescriptions this morning that were suppose to be sent at her visit but nothing was at the pharmacy. Can these be sent [facility] [place] [facility] soon as possible? She did not have them to take this morning.

## 2017-05-07 NOTE — Telephone Encounter (Signed)
Pt called stating she needs a refill of all of her medications [facility] [place] yesterday

## 2017-05-29 IMAGING — MR MR HEAD W/O CM
9 of 10 series · 35 of 48 positions shown · non-contrast
Comparison: Head CT 11/28/2016 and MRI 01/07/2013

CLINICAL DATA: Acute encephalopathy. History of breast cancer and
autoimmune hepatitis.

EXAM:
MRI HEAD WITHOUT CONTRAST
TECHNIQUE: Multiplanar, multiecho pulse sequences of the brain and surrounding
structures were obtained without intravenous contrast.

[Series 3: DWI · axial · 3.0mm · 0.94mm/px · z∈[-28,+108]mm · 8 of 94 slices shown (1 of 2)]
[im 1/94]
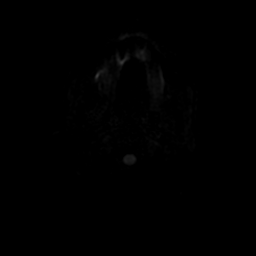
[im 11/94]
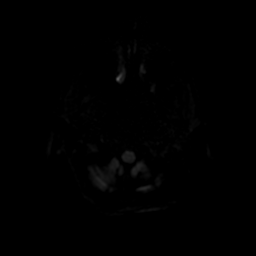
[im 32/94]
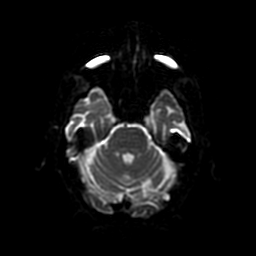
[im 42/94]
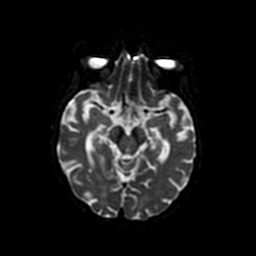
[im 52/94]
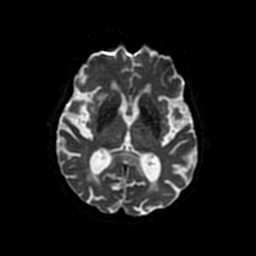
[im 63/94]
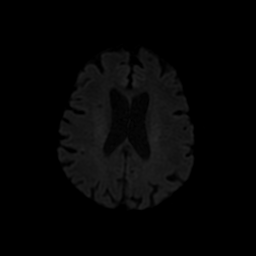
[im 83/94]
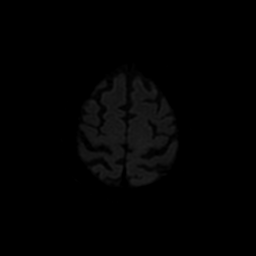
[im 94/94]
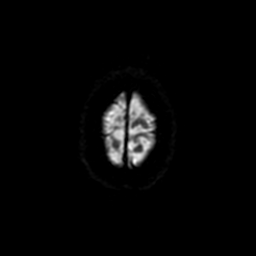

[Series 4: T2 · axial · 5.0mm · 0.47mm/px · z∈[-28,+108]mm · 2 of 24 slices shown (1 of 2)]
[im 1/24]
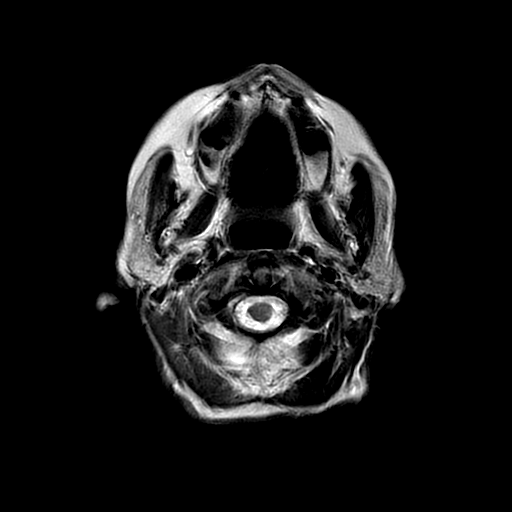
[im 24/24]
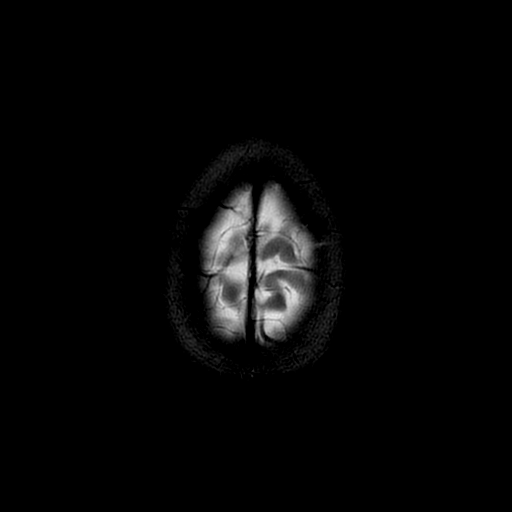

[Series 5: DWI · coronal · 4.0mm · 0.94mm/px · 7 of 68 slices shown (2 of 2)]
[im 1/68]
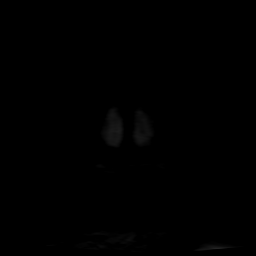
[im 12/68]
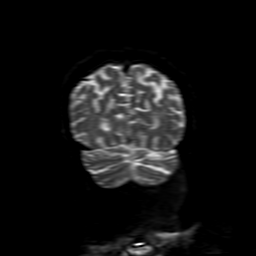
[im 23/68]
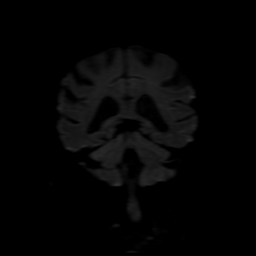
[im 34/68]
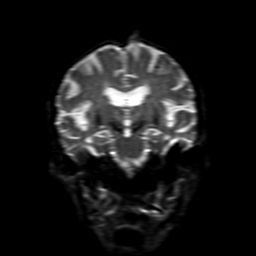
[im 45/68]
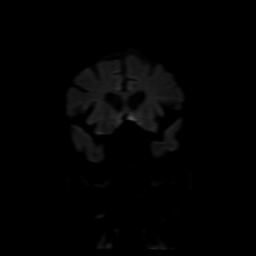
[im 56/68]
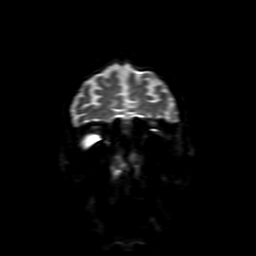
[im 68/68]
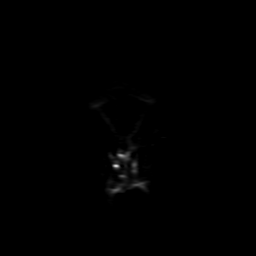

[Series 6: FLAIR · axial · 5.0mm · 0.47mm/px · z∈[-28,+108]mm · 2 of 24 slices shown (1 of 2)]
[im 1/24]
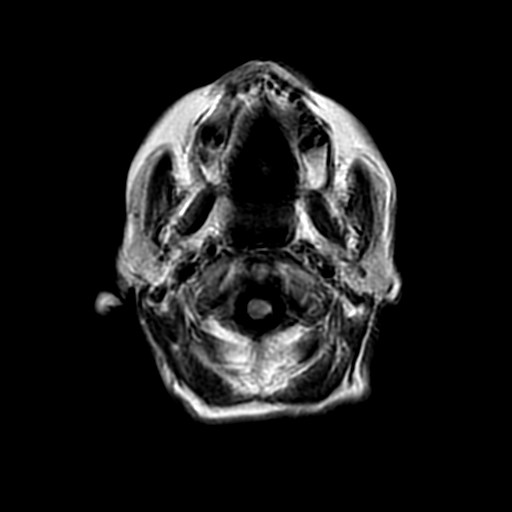
[im 24/24]
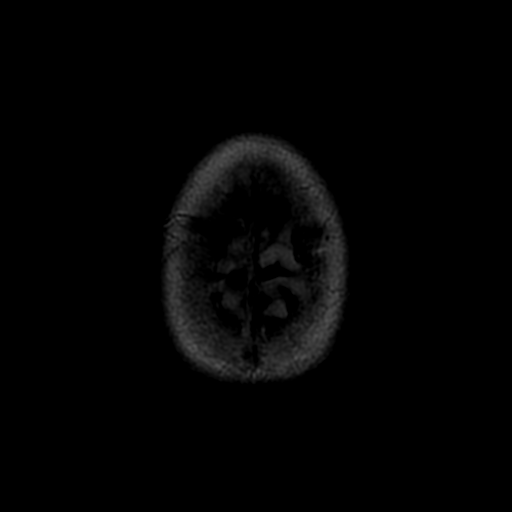

[Series 7: (person_name) · axial · 3.0mm · 0.47mm/px · z∈[-30,+4]mm · 3 of 96 slices shown]
[im 1/96]
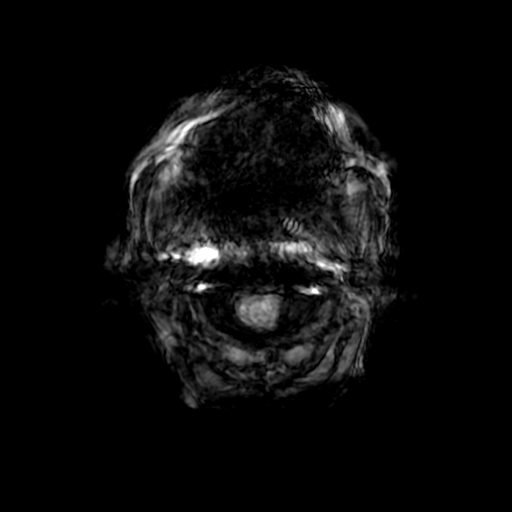
[im 12/96]
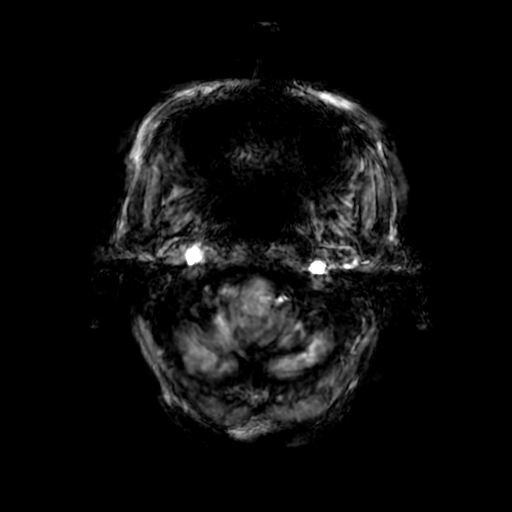
[im 24/96]
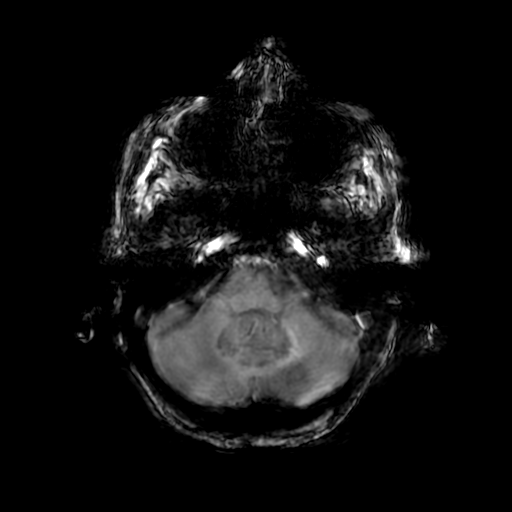

[Series 8: FLAIR · sagittal · 5.0mm · 0.47mm/px · 2 of 23 slices shown (2 of 2)]
[im 1/23]
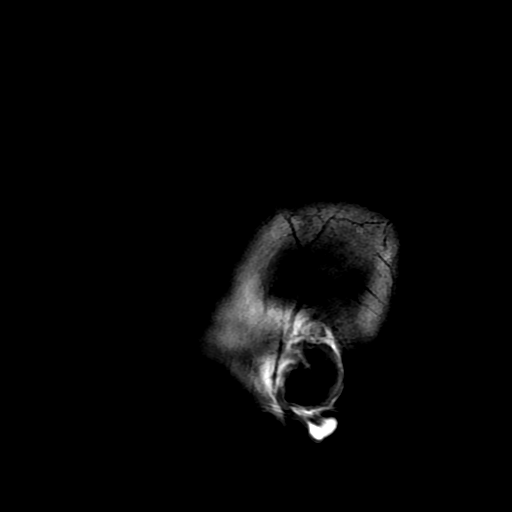
[im 23/23]
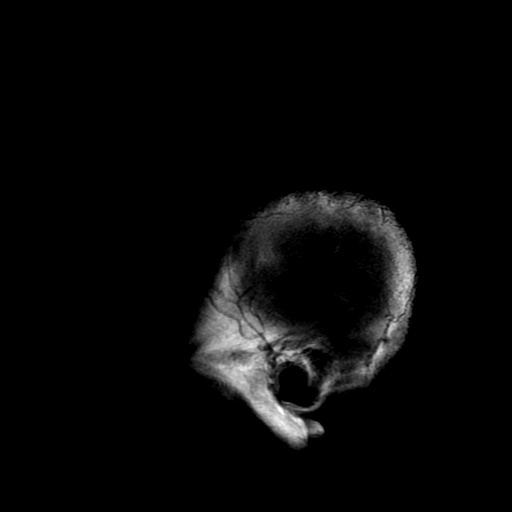

[Series 10: T2 · coronal · 5.0mm · 0.39mm/px · 3 of 28 slices shown (2 of 2)]
[im 1/28]
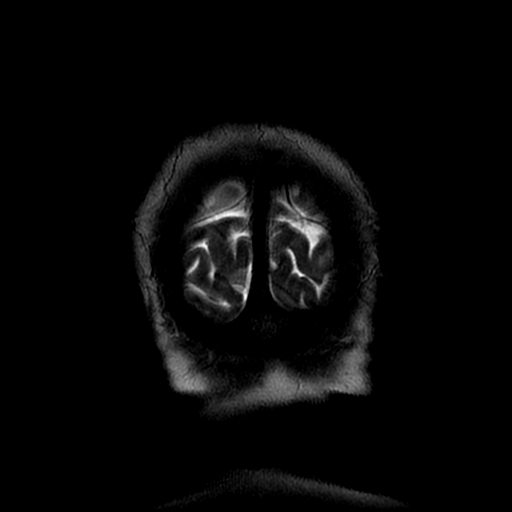
[im 14/28]
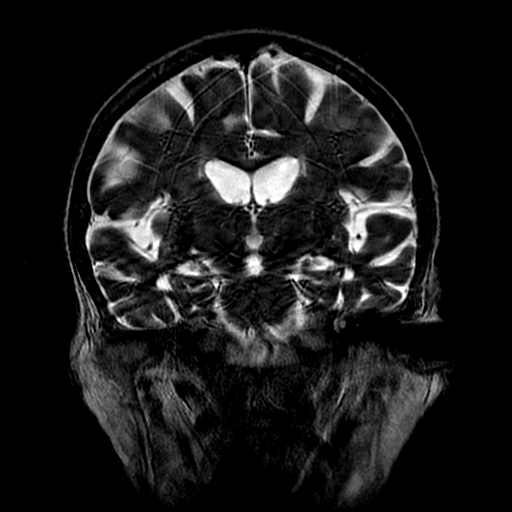
[im 28/28]
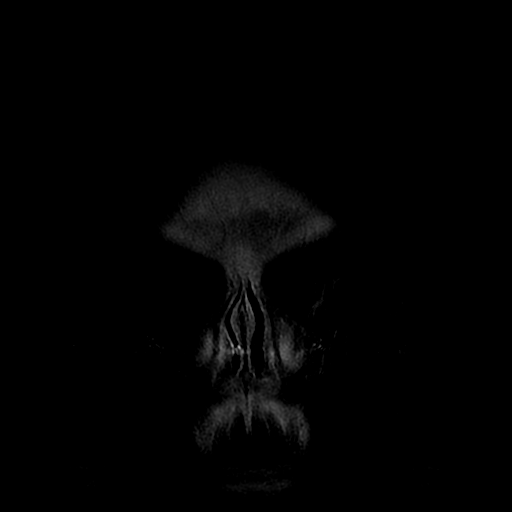

[Series 350: ADC · axial · 3.0mm · 0.94mm/px · z∈[-28,+108]mm · 5 of 47 slices shown (1 of 2)]
[im 1/47]
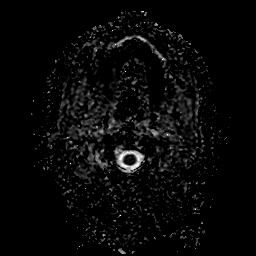
[im 12/47]
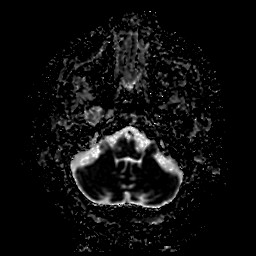
[im 24/47]
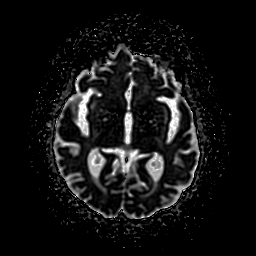
[im 35/47]
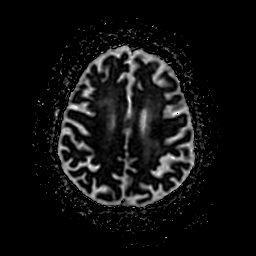
[im 47/47]
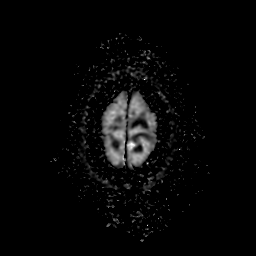

[Series 550: ADC · coronal · 4.0mm · 0.94mm/px · 3 of 34 slices shown (2 of 2)]
[im 1/34]
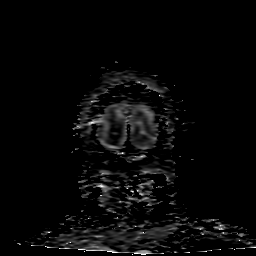
[im 17/34]
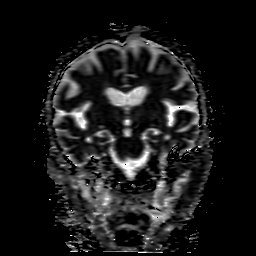
[im 34/34]
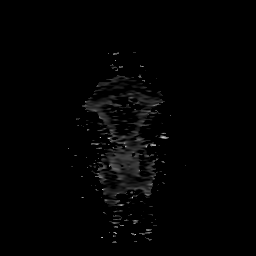

[35 of 48 positions shown; findings below may reference images not displayed]

FINDINGS: Brain: There is no evidence of acute infarct, mass, midline shift,
or extra-axial fluid collection. Scattered chronic microhemorrhages
are present throughout both cerebral hemispheres, predominantly in
peripheral in location and more conspicuous than on the prior MRI
which may reflect a true interval increase in number versus
increased visibility from differences in technique. There is a
chronic left superior cerebellar infarct which is new from the prior
MRI. Patchy T2 hyperintensities in the subcortical and deep cerebral
white matter bilaterally have mildly progressed from the prior MRI
and are nonspecific but compatible with moderate chronic small
vessel ischemic disease. There is mild cerebral atrophy.

Vascular: Major intracranial vascular flow voids are preserved.

Skull and upper cervical spine: Unremarkable bone marrow signal.

Sinuses/Orbits: Bilateral cataract extraction. Small left maxillary
sinus mucous retention cyst. Minimal sphenoid sinus mucosal
thickening. No significant mastoid fluid.

Other: None.
IMPRESSION: 1. No acute intracranial abnormality.
2. Moderate chronic small vessel ischemic disease, progressed from
3. Chronic left cerebellar infarct.

## 2017-06-19 ENCOUNTER — Ambulatory Visit: Payer: Medicare Other | Admitting: Cardiovascular Disease

## 2017-06-25 ENCOUNTER — Telehealth: Payer: Self-pay | Admitting: Internal Medicine

## 2017-06-25 NOTE — Telephone Encounter (Signed)
Yes, it is on Dr. Enis Slipper desk to be signed

## 2017-06-25 NOTE — Telephone Encounter (Signed)
Called asking if we have received a fax for the [facility] to be resigned

## 2017-06-29 NOTE — Telephone Encounter (Signed)
Pt called checking on the status of this. She said that they are holding a room for her but she cannot move in until they receive this. Please advise.

## 2017-06-29 NOTE — Telephone Encounter (Signed)
Faxed it this morning.

## 2017-06-30 ENCOUNTER — Other Ambulatory Visit: Payer: Self-pay | Admitting: Internal Medicine

## 2017-06-30 DIAGNOSIS — I4891 Unspecified atrial fibrillation: Secondary | ICD-10-CM

## 2017-07-14 DIAGNOSIS — F0280 Dementia in other diseases classified elsewhere without behavioral disturbance: Secondary | ICD-10-CM | POA: Diagnosis not present

## 2017-07-14 DIAGNOSIS — E871 Hypo-osmolality and hyponatremia: Secondary | ICD-10-CM | POA: Diagnosis not present

## 2017-07-14 DIAGNOSIS — R2681 Unsteadiness on feet: Secondary | ICD-10-CM | POA: Diagnosis not present

## 2017-07-14 DIAGNOSIS — I5032 Chronic diastolic (congestive) heart failure: Secondary | ICD-10-CM | POA: Diagnosis not present

## 2017-07-15 ENCOUNTER — Ambulatory Visit: Payer: Medicare Other | Admitting: Internal Medicine

## 2017-07-15 DIAGNOSIS — R2689 Other abnormalities of gait and mobility: Secondary | ICD-10-CM | POA: Diagnosis not present

## 2017-07-15 DIAGNOSIS — I69398 Other sequelae of cerebral infarction: Secondary | ICD-10-CM | POA: Diagnosis not present

## 2017-07-16 DIAGNOSIS — R2689 Other abnormalities of gait and mobility: Secondary | ICD-10-CM | POA: Diagnosis not present

## 2017-07-16 DIAGNOSIS — I69398 Other sequelae of cerebral infarction: Secondary | ICD-10-CM | POA: Diagnosis not present

## 2017-07-16 DIAGNOSIS — Z0189 Encounter for other specified special examinations: Secondary | ICD-10-CM | POA: Diagnosis not present

## 2017-07-20 DIAGNOSIS — I69398 Other sequelae of cerebral infarction: Secondary | ICD-10-CM | POA: Diagnosis not present

## 2017-07-20 DIAGNOSIS — R2689 Other abnormalities of gait and mobility: Secondary | ICD-10-CM | POA: Diagnosis not present

## 2017-07-21 DIAGNOSIS — R2689 Other abnormalities of gait and mobility: Secondary | ICD-10-CM | POA: Diagnosis not present

## 2017-07-21 DIAGNOSIS — I69398 Other sequelae of cerebral infarction: Secondary | ICD-10-CM | POA: Diagnosis not present

## 2017-07-22 DIAGNOSIS — I480 Paroxysmal atrial fibrillation: Secondary | ICD-10-CM | POA: Diagnosis not present

## 2017-07-22 DIAGNOSIS — I10 Essential (primary) hypertension: Secondary | ICD-10-CM | POA: Diagnosis not present

## 2017-07-22 DIAGNOSIS — I5032 Chronic diastolic (congestive) heart failure: Secondary | ICD-10-CM | POA: Diagnosis not present

## 2017-07-22 DIAGNOSIS — K219 Gastro-esophageal reflux disease without esophagitis: Secondary | ICD-10-CM | POA: Diagnosis not present

## 2017-07-23 DIAGNOSIS — I69398 Other sequelae of cerebral infarction: Secondary | ICD-10-CM | POA: Diagnosis not present

## 2017-07-23 DIAGNOSIS — R2689 Other abnormalities of gait and mobility: Secondary | ICD-10-CM | POA: Diagnosis not present

## 2017-07-27 DIAGNOSIS — I69398 Other sequelae of cerebral infarction: Secondary | ICD-10-CM | POA: Diagnosis not present

## 2017-07-27 DIAGNOSIS — R2689 Other abnormalities of gait and mobility: Secondary | ICD-10-CM | POA: Diagnosis not present

## 2017-07-28 DIAGNOSIS — R2689 Other abnormalities of gait and mobility: Secondary | ICD-10-CM | POA: Diagnosis not present

## 2017-07-28 DIAGNOSIS — I69398 Other sequelae of cerebral infarction: Secondary | ICD-10-CM | POA: Diagnosis not present

## 2017-07-29 DIAGNOSIS — I69398 Other sequelae of cerebral infarction: Secondary | ICD-10-CM | POA: Diagnosis not present

## 2017-07-29 DIAGNOSIS — R2689 Other abnormalities of gait and mobility: Secondary | ICD-10-CM | POA: Diagnosis not present

## 2017-07-30 DIAGNOSIS — I69398 Other sequelae of cerebral infarction: Secondary | ICD-10-CM | POA: Diagnosis not present

## 2017-07-30 DIAGNOSIS — R2689 Other abnormalities of gait and mobility: Secondary | ICD-10-CM | POA: Diagnosis not present

## 2017-08-03 DIAGNOSIS — I69398 Other sequelae of cerebral infarction: Secondary | ICD-10-CM | POA: Diagnosis not present

## 2017-08-03 DIAGNOSIS — R2689 Other abnormalities of gait and mobility: Secondary | ICD-10-CM | POA: Diagnosis not present

## 2017-08-05 DIAGNOSIS — R2689 Other abnormalities of gait and mobility: Secondary | ICD-10-CM | POA: Diagnosis not present

## 2017-08-05 DIAGNOSIS — I69398 Other sequelae of cerebral infarction: Secondary | ICD-10-CM | POA: Diagnosis not present

## 2017-08-11 DIAGNOSIS — Z0189 Encounter for other specified special examinations: Secondary | ICD-10-CM | POA: Diagnosis not present

## 2017-08-12 DIAGNOSIS — I69398 Other sequelae of cerebral infarction: Secondary | ICD-10-CM | POA: Diagnosis not present

## 2017-08-12 DIAGNOSIS — R2689 Other abnormalities of gait and mobility: Secondary | ICD-10-CM | POA: Diagnosis not present

## 2017-08-17 DIAGNOSIS — K219 Gastro-esophageal reflux disease without esophagitis: Secondary | ICD-10-CM | POA: Diagnosis not present

## 2017-08-17 DIAGNOSIS — I480 Paroxysmal atrial fibrillation: Secondary | ICD-10-CM | POA: Diagnosis not present

## 2017-08-17 DIAGNOSIS — I10 Essential (primary) hypertension: Secondary | ICD-10-CM | POA: Diagnosis not present

## 2017-08-17 DIAGNOSIS — I5032 Chronic diastolic (congestive) heart failure: Secondary | ICD-10-CM | POA: Diagnosis not present

## 2017-09-01 DIAGNOSIS — M79675 Pain in left toe(s): Secondary | ICD-10-CM | POA: Diagnosis not present

## 2017-09-01 DIAGNOSIS — M79674 Pain in right toe(s): Secondary | ICD-10-CM | POA: Diagnosis not present

## 2017-09-01 DIAGNOSIS — B351 Tinea unguium: Secondary | ICD-10-CM | POA: Diagnosis not present

## 2017-09-16 DIAGNOSIS — I10 Essential (primary) hypertension: Secondary | ICD-10-CM | POA: Diagnosis not present

## 2017-09-16 DIAGNOSIS — I480 Paroxysmal atrial fibrillation: Secondary | ICD-10-CM | POA: Diagnosis not present

## 2017-09-16 DIAGNOSIS — K219 Gastro-esophageal reflux disease without esophagitis: Secondary | ICD-10-CM | POA: Diagnosis not present

## 2017-09-16 DIAGNOSIS — I5032 Chronic diastolic (congestive) heart failure: Secondary | ICD-10-CM | POA: Diagnosis not present

## 2017-10-16 DIAGNOSIS — K219 Gastro-esophageal reflux disease without esophagitis: Secondary | ICD-10-CM | POA: Diagnosis not present

## 2017-10-16 DIAGNOSIS — I5032 Chronic diastolic (congestive) heart failure: Secondary | ICD-10-CM | POA: Diagnosis not present

## 2017-10-16 DIAGNOSIS — I480 Paroxysmal atrial fibrillation: Secondary | ICD-10-CM | POA: Diagnosis not present

## 2017-10-16 DIAGNOSIS — I10 Essential (primary) hypertension: Secondary | ICD-10-CM | POA: Diagnosis not present

## 2017-11-13 DIAGNOSIS — K219 Gastro-esophageal reflux disease without esophagitis: Secondary | ICD-10-CM | POA: Diagnosis not present

## 2017-11-13 DIAGNOSIS — I10 Essential (primary) hypertension: Secondary | ICD-10-CM | POA: Diagnosis not present

## 2017-11-13 DIAGNOSIS — I5032 Chronic diastolic (congestive) heart failure: Secondary | ICD-10-CM | POA: Diagnosis not present

## 2017-11-13 DIAGNOSIS — I480 Paroxysmal atrial fibrillation: Secondary | ICD-10-CM | POA: Diagnosis not present

## 2017-11-23 DIAGNOSIS — B351 Tinea unguium: Secondary | ICD-10-CM | POA: Diagnosis not present

## 2017-11-23 DIAGNOSIS — M79674 Pain in right toe(s): Secondary | ICD-10-CM | POA: Diagnosis not present

## 2017-11-23 DIAGNOSIS — M79675 Pain in left toe(s): Secondary | ICD-10-CM | POA: Diagnosis not present

## 2017-12-14 DIAGNOSIS — I5032 Chronic diastolic (congestive) heart failure: Secondary | ICD-10-CM | POA: Diagnosis not present

## 2017-12-14 DIAGNOSIS — I480 Paroxysmal atrial fibrillation: Secondary | ICD-10-CM | POA: Diagnosis not present

## 2017-12-14 DIAGNOSIS — I10 Essential (primary) hypertension: Secondary | ICD-10-CM | POA: Diagnosis not present

## 2017-12-14 DIAGNOSIS — K219 Gastro-esophageal reflux disease without esophagitis: Secondary | ICD-10-CM | POA: Diagnosis not present

## 2018-01-13 DIAGNOSIS — I10 Essential (primary) hypertension: Secondary | ICD-10-CM | POA: Diagnosis not present

## 2018-01-13 DIAGNOSIS — I480 Paroxysmal atrial fibrillation: Secondary | ICD-10-CM | POA: Diagnosis not present

## 2018-01-13 DIAGNOSIS — K219 Gastro-esophageal reflux disease without esophagitis: Secondary | ICD-10-CM | POA: Diagnosis not present

## 2018-01-13 DIAGNOSIS — I5032 Chronic diastolic (congestive) heart failure: Secondary | ICD-10-CM | POA: Diagnosis not present

## 2018-02-17 DIAGNOSIS — I6389 Other cerebral infarction: Secondary | ICD-10-CM | POA: Diagnosis not present

## 2018-02-17 DIAGNOSIS — I480 Paroxysmal atrial fibrillation: Secondary | ICD-10-CM | POA: Diagnosis not present

## 2018-02-17 DIAGNOSIS — I10 Essential (primary) hypertension: Secondary | ICD-10-CM | POA: Diagnosis not present

## 2018-02-17 DIAGNOSIS — I5032 Chronic diastolic (congestive) heart failure: Secondary | ICD-10-CM | POA: Diagnosis not present

## 2018-02-23 DIAGNOSIS — Z0189 Encounter for other specified special examinations: Secondary | ICD-10-CM | POA: Diagnosis not present

## 2018-02-23 DIAGNOSIS — I5032 Chronic diastolic (congestive) heart failure: Secondary | ICD-10-CM | POA: Diagnosis not present

## 2018-02-23 DIAGNOSIS — I480 Paroxysmal atrial fibrillation: Secondary | ICD-10-CM | POA: Diagnosis not present

## 2018-02-23 DIAGNOSIS — K219 Gastro-esophageal reflux disease without esophagitis: Secondary | ICD-10-CM | POA: Diagnosis not present

## 2018-02-23 DIAGNOSIS — I10 Essential (primary) hypertension: Secondary | ICD-10-CM | POA: Diagnosis not present

## 2018-03-23 DIAGNOSIS — I10 Essential (primary) hypertension: Secondary | ICD-10-CM | POA: Diagnosis not present

## 2018-03-23 DIAGNOSIS — K219 Gastro-esophageal reflux disease without esophagitis: Secondary | ICD-10-CM | POA: Diagnosis not present

## 2018-03-23 DIAGNOSIS — I5032 Chronic diastolic (congestive) heart failure: Secondary | ICD-10-CM | POA: Diagnosis not present

## 2018-03-23 DIAGNOSIS — I480 Paroxysmal atrial fibrillation: Secondary | ICD-10-CM | POA: Diagnosis not present

## 2018-03-30 DIAGNOSIS — M79675 Pain in left toe(s): Secondary | ICD-10-CM | POA: Diagnosis not present

## 2018-03-30 DIAGNOSIS — M79674 Pain in right toe(s): Secondary | ICD-10-CM | POA: Diagnosis not present

## 2018-03-30 DIAGNOSIS — B351 Tinea unguium: Secondary | ICD-10-CM | POA: Diagnosis not present

## 2018-04-30 DIAGNOSIS — I5032 Chronic diastolic (congestive) heart failure: Secondary | ICD-10-CM | POA: Diagnosis not present

## 2018-04-30 DIAGNOSIS — K219 Gastro-esophageal reflux disease without esophagitis: Secondary | ICD-10-CM | POA: Diagnosis not present

## 2018-04-30 DIAGNOSIS — I10 Essential (primary) hypertension: Secondary | ICD-10-CM | POA: Diagnosis not present

## 2018-04-30 DIAGNOSIS — I480 Paroxysmal atrial fibrillation: Secondary | ICD-10-CM | POA: Diagnosis not present

## 2018-05-25 DIAGNOSIS — I5032 Chronic diastolic (congestive) heart failure: Secondary | ICD-10-CM | POA: Diagnosis not present

## 2018-05-25 DIAGNOSIS — I480 Paroxysmal atrial fibrillation: Secondary | ICD-10-CM | POA: Diagnosis not present

## 2018-05-25 DIAGNOSIS — K219 Gastro-esophageal reflux disease without esophagitis: Secondary | ICD-10-CM | POA: Diagnosis not present

## 2018-05-25 DIAGNOSIS — I10 Essential (primary) hypertension: Secondary | ICD-10-CM | POA: Diagnosis not present

## 2018-06-25 DIAGNOSIS — M79674 Pain in right toe(s): Secondary | ICD-10-CM | POA: Diagnosis not present

## 2018-06-25 DIAGNOSIS — I10 Essential (primary) hypertension: Secondary | ICD-10-CM | POA: Diagnosis not present

## 2018-06-25 DIAGNOSIS — I480 Paroxysmal atrial fibrillation: Secondary | ICD-10-CM | POA: Diagnosis not present

## 2018-06-25 DIAGNOSIS — K219 Gastro-esophageal reflux disease without esophagitis: Secondary | ICD-10-CM | POA: Diagnosis not present

## 2018-06-25 DIAGNOSIS — I5032 Chronic diastolic (congestive) heart failure: Secondary | ICD-10-CM | POA: Diagnosis not present

## 2018-06-25 DIAGNOSIS — M79675 Pain in left toe(s): Secondary | ICD-10-CM | POA: Diagnosis not present

## 2018-06-25 DIAGNOSIS — B351 Tinea unguium: Secondary | ICD-10-CM | POA: Diagnosis not present

## 2018-07-01 DIAGNOSIS — Z23 Encounter for immunization: Secondary | ICD-10-CM | POA: Diagnosis not present

## 2018-08-04 DIAGNOSIS — Z23 Encounter for immunization: Secondary | ICD-10-CM | POA: Diagnosis not present

## 2018-08-04 DIAGNOSIS — H00013 Hordeolum externum right eye, unspecified eyelid: Secondary | ICD-10-CM | POA: Diagnosis not present

## 2018-08-06 DIAGNOSIS — I10 Essential (primary) hypertension: Secondary | ICD-10-CM | POA: Diagnosis not present

## 2018-08-06 DIAGNOSIS — I480 Paroxysmal atrial fibrillation: Secondary | ICD-10-CM | POA: Diagnosis not present

## 2018-08-06 DIAGNOSIS — I5032 Chronic diastolic (congestive) heart failure: Secondary | ICD-10-CM | POA: Diagnosis not present

## 2018-08-06 DIAGNOSIS — K219 Gastro-esophageal reflux disease without esophagitis: Secondary | ICD-10-CM | POA: Diagnosis not present

## 2018-08-18 DIAGNOSIS — H00013 Hordeolum externum right eye, unspecified eyelid: Secondary | ICD-10-CM | POA: Diagnosis not present

## 2018-08-18 DIAGNOSIS — Z23 Encounter for immunization: Secondary | ICD-10-CM | POA: Diagnosis not present

## 2018-08-24 DIAGNOSIS — K219 Gastro-esophageal reflux disease without esophagitis: Secondary | ICD-10-CM | POA: Diagnosis not present

## 2018-08-24 DIAGNOSIS — I10 Essential (primary) hypertension: Secondary | ICD-10-CM | POA: Diagnosis not present

## 2018-08-24 DIAGNOSIS — I480 Paroxysmal atrial fibrillation: Secondary | ICD-10-CM | POA: Diagnosis not present

## 2018-08-24 DIAGNOSIS — I5032 Chronic diastolic (congestive) heart failure: Secondary | ICD-10-CM | POA: Diagnosis not present

## 2019-03-15 ENCOUNTER — Inpatient Hospital Stay (HOSPITAL_COMMUNITY)
Admission: EM | Admit: 2019-03-15 | Discharge: 2019-03-21 | DRG: 065 | Disposition: A | Discharge status: Skilled Nursing Facility | Payer: Medicare Other | Source: Other Acute Inpatient Hospital | Attending: Internal Medicine | Admitting: Internal Medicine

## 2019-03-15 DIAGNOSIS — M797 Fibromyalgia: Secondary | ICD-10-CM | POA: Diagnosis present

## 2019-03-15 DIAGNOSIS — K219 Gastro-esophageal reflux disease without esophagitis: Secondary | ICD-10-CM | POA: Diagnosis present

## 2019-03-15 DIAGNOSIS — Z7901 Long term (current) use of anticoagulants: Secondary | ICD-10-CM | POA: Diagnosis not present

## 2019-03-15 DIAGNOSIS — Z809 Family history of malignant neoplasm, unspecified: Secondary | ICD-10-CM | POA: Diagnosis not present

## 2019-03-15 DIAGNOSIS — I4819 Other persistent atrial fibrillation: Secondary | ICD-10-CM | POA: Diagnosis present

## 2019-03-15 DIAGNOSIS — D689 Coagulation defect, unspecified: Secondary | ICD-10-CM | POA: Diagnosis present

## 2019-03-15 DIAGNOSIS — Z8249 Family history of ischemic heart disease and other diseases of the circulatory system: Secondary | ICD-10-CM

## 2019-03-15 DIAGNOSIS — E876 Hypokalemia: Secondary | ICD-10-CM | POA: Diagnosis present

## 2019-03-15 DIAGNOSIS — Z820 Family history of epilepsy and other diseases of the nervous system: Secondary | ICD-10-CM

## 2019-03-15 DIAGNOSIS — M316 Other giant cell arteritis: Secondary | ICD-10-CM | POA: Diagnosis present

## 2019-03-15 DIAGNOSIS — Z79890 Hormone replacement therapy (postmenopausal): Secondary | ICD-10-CM | POA: Diagnosis not present

## 2019-03-15 DIAGNOSIS — E785 Hyperlipidemia, unspecified: Secondary | ICD-10-CM | POA: Diagnosis present

## 2019-03-15 DIAGNOSIS — I615 Nontraumatic intracerebral hemorrhage, intraventricular: Principal | ICD-10-CM | POA: Diagnosis present

## 2019-03-15 DIAGNOSIS — I161 Hypertensive emergency: Secondary | ICD-10-CM | POA: Diagnosis not present

## 2019-03-15 DIAGNOSIS — I619 Nontraumatic intracerebral hemorrhage, unspecified: Secondary | ICD-10-CM | POA: Diagnosis present

## 2019-03-15 DIAGNOSIS — F0390 Unspecified dementia without behavioral disturbance: Secondary | ICD-10-CM | POA: Diagnosis present

## 2019-03-15 DIAGNOSIS — R4701 Aphasia: Secondary | ICD-10-CM | POA: Diagnosis present

## 2019-03-15 DIAGNOSIS — Z79899 Other long term (current) drug therapy: Secondary | ICD-10-CM

## 2019-03-15 DIAGNOSIS — Z1159 Encounter for screening for other viral diseases: Secondary | ICD-10-CM | POA: Diagnosis not present

## 2019-03-15 DIAGNOSIS — Z833 Family history of diabetes mellitus: Secondary | ICD-10-CM | POA: Diagnosis not present

## 2019-03-15 DIAGNOSIS — R471 Dysarthria and anarthria: Secondary | ICD-10-CM | POA: Diagnosis present

## 2019-03-15 DIAGNOSIS — I509 Heart failure, unspecified: Secondary | ICD-10-CM | POA: Diagnosis not present

## 2019-03-15 DIAGNOSIS — Z8673 Personal history of transient ischemic attack (TIA), and cerebral infarction without residual deficits: Secondary | ICD-10-CM

## 2019-03-15 DIAGNOSIS — I4891 Unspecified atrial fibrillation: Secondary | ICD-10-CM | POA: Diagnosis not present

## 2019-03-15 DIAGNOSIS — E871 Hypo-osmolality and hyponatremia: Secondary | ICD-10-CM | POA: Diagnosis not present

## 2019-03-15 DIAGNOSIS — I610 Nontraumatic intracerebral hemorrhage in hemisphere, subcortical: Secondary | ICD-10-CM

## 2019-03-15 DIAGNOSIS — I639 Cerebral infarction, unspecified: Secondary | ICD-10-CM | POA: Diagnosis not present

## 2019-03-15 DIAGNOSIS — R4182 Altered mental status, unspecified: Secondary | ICD-10-CM

## 2019-03-15 DIAGNOSIS — I10 Essential (primary) hypertension: Secondary | ICD-10-CM | POA: Diagnosis present

## 2019-03-15 DIAGNOSIS — I618 Other nontraumatic intracerebral hemorrhage: Secondary | ICD-10-CM | POA: Diagnosis present

## 2019-03-15 DIAGNOSIS — E039 Hypothyroidism, unspecified: Secondary | ICD-10-CM | POA: Diagnosis present

## 2019-03-15 LAB — MRSA PCR SCREENING: MRSA by PCR: NEGATIVE

## 2019-03-15 MED ORDER — PANTOPRAZOLE SODIUM 40 MG IV SOLR
40.0000 mg | INTRAVENOUS | Status: DC
  Administered 2019-03-16: 40 mg via INTRAVENOUS
  Filled 2019-03-15: qty 40

## 2019-03-15 MED ORDER — ALBUTEROL SULFATE (2.5 MG/3ML) 0.083% IN NEBU: 3.0000 mL | INHALATION_SOLUTION | RESPIRATORY_TRACT | Status: DC | PRN

## 2019-03-15 MED ORDER — GENERIC EXTERNAL MEDICATION
0.00 | Status: DC
Start: ? — End: 2019-03-15

## 2019-03-15 MED ORDER — NICARDIPINE HCL IN NACL 20-0.86 MG/200ML-% IV SOLN
3.0000 mg/h | INTRAVENOUS | Status: DC
  Filled 2019-03-15: qty 200

## 2019-03-15 MED ORDER — HYDRALAZINE HCL 20 MG/ML IJ SOLN: 10.0000 mg | Freq: Four times a day (QID) | INTRAMUSCULAR | Status: DC | PRN

## 2019-03-15 MED ORDER — CHLORHEXIDINE GLUCONATE CLOTH 2 % EX PADS
6.0000 | MEDICATED_PAD | Freq: Every day | CUTANEOUS | Status: DC
  Administered 2019-03-15 – 2019-03-21 (×7): 6 via TOPICAL

## 2019-03-15 MED ORDER — METOPROLOL TARTRATE 5 MG/5ML IV SOLN
5.0000 mg | Freq: Four times a day (QID) | INTRAVENOUS | Status: DC | PRN
  Administered 2019-03-15: 5 mg via INTRAVENOUS
  Filled 2019-03-15: qty 5

## 2019-03-15 MED ORDER — LEVOTHYROXINE SODIUM 100 MCG/5ML IV SOLN
37.5000 ug | Freq: Every day | INTRAVENOUS | Status: DC
  Administered 2019-03-16: 37.5 ug via INTRAVENOUS
  Filled 2019-03-15: qty 5

## 2019-03-15 NOTE — H&P (Signed)
NAME:  Michele Diaz, MRN:  [id number], DOB:  [birth year], LOS: 0 ADMISSION DATE:  03/15/2019, CONSULTATION DATE:  6/30 REFERRING MD:  OSH, CHIEF COMPLAINT:  ICH   Brief History   85 year old female with history of atrial fibrillation on Xarelto stroke, temporal arteritis, GERD, fibromyalgia, diastolic dysfunction, hypertension who presented to [facility] regional hospital on 6/30 with altered mental status.  Head CT revealed thalamic intracerebral hemorrhage with minimal intraventricular extension.  She was given Kcentra and vitamin K.  She was transferred to [facility] for neurosurgery evaluation.  She remains confused but is protecting her airway currently.  CCM was consulted for ICU admission for close neurologic monitoring.  History of present illness   85 year old female with history of atrial fibrillation on Xarelto stroke, temporal arteritis, GERD, fibromyalgia, diastolic dysfunction, hypertension who presented to [facility] regional hospital on 6/30 with altered mental status.  Head CT revealed thalamic intracerebral hemorrhage with minimal intraventricular extension.  She was given Kcentra and vitamin K.  She was transferred to [facility] for neurosurgery evaluation.  She remains confused but is protecting her airway currently.  CCM was consulted for ICU admission for close neurologic monitoring.  Past Medical History   Past Medical History:  Diagnosis Date  . Allergic rhinitis   . Anxiety   . Arthritis    "hips, hands, feet; pretty much feels like all over" (01/07/2013)  . Asthma   . Breast cancer ([place]) ~ 2008   "right" (01/07/2013)  . Chronic lower back pain   . Daily headache    "related to temporal arteritis" (01/07/2013)  . Depression   . Diastolic dysfunction   . Diverticulitis of colon   . Fibromyalgia   . GERD (gastroesophageal reflux disease)   . Giant cell arteritis ([place]) 2003   devashwar  . Hepatitis, autoimmune ([place])   . Herpes zoster 2011  . Hyperlipidemia    statins increased LFTs  . Hypertension   . Hypothyroid   . Persistent atrial fibrillation ([place])    a. s/p TEE/DCCV in 01/2016  . Stroke ([place])   . Temporal arteritis ([place])   . Unspecified vitamin D deficiency   . Weight loss 6/11     Significant Hospital Events     Consults:  Neurosurgery [facility]) 6/30 >>  Procedures:    Significant Diagnostic Tests:  CT head (outside hospital) 6/30 >>reportedly Intracerebral hemorrhage basal ganglia left posterior thalamus  Micro Data:    Antimicrobials:     Interim history/subjective:    Objective   Blood pressure (!) 131/94, pulse (!) 121, temperature 98.4 F (36.9 C), temperature source Oral, resp. rate (!) 27, height 5' 2.5" (1.588 m), weight 63.5 kg, SpO2 96 %.       No intake or output data in the 24 hours ending 03/15/19 1603 Filed Weights   03/15/19 1453  Weight: 63.5 kg    Examination: General: pleasant elderly female NAD  HENT: mm moist, no JVD Lungs: Respirations are even and nonlabored on room air, clear Cardiovascular: S1-S2 regular rate and rhythm Abdomen: Soft, nontender Extremities: Dry, no edema Neuro: Awake, alert, pleasantly confused but knows why she is here.  Follows commands, answers questions appropriately.  Resolved Hospital Problem list     Assessment & Plan:  ICH- 2 to 3 cm left posterior thalamic bleed in the setting of Xarelto K Centra and vitamin K given at outside hospital Plan- Neurosurgery following Continue close monitoring in neuro ICU Blood pressure management No surgical intervention recommended per neurosurgery  Repeat CT scan in 12 hours  Hx HTN  Plan -  Strict BP control  Need to resume home meds - pharmacy working on med rec Goal SBP<160 Consider cardene gtt if needed for BP control   Best practice:  Diet: N.p.o. for now Pain/Anxiety/Delirium protocol (if indicated): N/A VAP protocol (if indicated): n/A DVT prophylaxis: SCD's GI prophylaxis: n/a Glucose control: n/a  Mobility: BR Code Status: full Family Communication: will reach out to family  Disposition:   Labs   CBC: No results for input(s): WBC, NEUTROABS, HGB, HCT, MCV, PLT in the last 168 hours.  Basic Metabolic Panel: No results for input(s): NA, K, CL, CO2, GLUCOSE, BUN, CREATININE, CALCIUM, MG, PHOS in the last 168 hours. GFR: CrCl cannot be calculated (Patient's most recent lab result is older than the maximum 21 days allowed.). No results for input(s): PROCALCITON, WBC, LATICACIDVEN in the last 168 hours.  Liver Function Tests: No results for input(s): AST, ALT, ALKPHOS, BILITOT, PROT, ALBUMIN in the last 168 hours. No results for input(s): LIPASE, AMYLASE in the last 168 hours. No results for input(s): AMMONIA in the last 168 hours.  ABG    Component Value Date/Time   TCO2 30 01/07/2013 1407     Coagulation Profile: No results for input(s): INR, PROTIME in the last 168 hours.  Cardiac Enzymes: No results for input(s): CKTOTAL, CKMB, CKMBINDEX, TROPONINI in the last 168 hours.  HbA1C: Hgb A1c MFr Bld  Date/Time Value Ref Range Status  01/08/2013 06:45 AM 5.3 <5.7 % Final    Comment:    (NOTE)                                                                       According to the ADA Clinical Practice Recommendations for 2011, when HbA1c is used as a screening test:  >=6.5%   Diagnostic of Diabetes Mellitus           (if abnormal result is confirmed) 5.7-6.4%   Increased risk of developing Diabetes Mellitus References:Diagnosis and Classification of Diabetes Mellitus,Diabetes FXTK,2409,73(ZHGDJ 1):S62-S69 and Standards of Medical Care in         Diabetes - 2011,Diabetes MEQA,8341,96 (Suppl 1):S11-S61.    CBG: No results for input(s): GLUCAP in the last 168 hours.  Review of Systems:   As per HPI above obtained from staff and records otherwise neg   Past Medical History  She,  has a past medical history of Allergic rhinitis, Anxiety, Arthritis, Asthma, Breast  cancer ([place]) (~ 2008), Chronic lower back pain, Daily headache, Depression, Diastolic dysfunction, Diverticulitis of colon, Fibromyalgia, GERD (gastroesophageal reflux disease), Giant cell arteritis ([place]) (2003), Hepatitis, autoimmune ([place]), Herpes zoster (2011), Hyperlipidemia, Hypertension, Hypothyroid, Persistent atrial fibrillation ([place]), Stroke ([place]), Temporal arteritis ([place]), Unspecified vitamin D deficiency, and Weight loss (6/11).   Surgical History    Past Surgical History:  Procedure Laterality Date  . ABDOMINAL HYSTERECTOMY  1988  . APPENDECTOMY    . BREAST BIOPSY Right   . BREAST LUMPECTOMY Right   . CARDIOVERSION N/A 02/06/2016   Procedure: CARDIOVERSION;  Surgeon: Skeet Latch, MD;  Location: [facility];  Service: Cardiovascular;  Laterality: N/A;  . CHOLECYSTECTOMY    . DILATION AND CURETTAGE OF UTERUS    . TEE WITHOUT  CARDIOVERSION N/A 02/06/2016   Procedure: TRANSESOPHAGEAL ECHOCARDIOGRAM (TEE);  Surgeon: Skeet Latch, MD;  Location: [facility] ENDOSCOPY;  Service: Cardiovascular;  Laterality: N/A;  . TONSILLECTOMY       Social History   reports that she has never smoked. She has never used smokeless tobacco. She reports that she does not drink alcohol or use drugs.   Family History   Her family history includes Alzheimer's disease in her sister; Cancer in her brother and sister; Diabetes in her mother; Heart attack in her mother; Heart disease in her brother and sister; Hypertension in an other family member.   Allergies Allergies  Allergen Reactions  . Atorvastatin Other (See Comments)    Elevated liver enzymes  . Celecoxib Shortness Of Breath, Itching and Rash    redness  . Rosuvastatin Other (See Comments)    Elevated liver enzymes  . Codeine Nausea Only  . Penicillins Hives     Home Medications  Prior to Admission medications   Medication Sig Start Date End Date Taking? Authorizing Provider  albuterol (PROVENTIL HFA;VENTOLIN HFA) 108 (90 Base) MCG/ACT  inhaler Inhale 1-2 puffs into the lungs every 6 (six) hours as needed for wheezing or shortness of breath. Patient not taking: Reported on 05/06/2017 11/05/16   [name], Evie Lacks, MD  azaTHIOprine (IMURAN) 50 MG tablet TAKE 1 TABLET (50 MG TOTAL) BY MOUTH DAILY. 12/02/16   [name], Evie Lacks, MD  cholecalciferol (VITAMIN D) 1000 UNITS tablet Take 1 tablet (1,000 Units total) by mouth daily. 01/14/13   Angiulli, Lavon Paganini, PA-C  diltiazem (CARDIZEM CD) 120 MG 24 hr capsule Take 1 capsule (120 mg total) by mouth daily. 05/07/17   [name], Evie Lacks, MD  escitalopram (LEXAPRO) 10 MG tablet Take 1 tablet (10 mg total) by mouth daily. 05/07/17   [name], Evie Lacks, MD  feeding supplement, ENSURE ENLIVE, (ENSURE ENLIVE) LIQD Take 237 mLs by mouth 2 (two) times daily between meals. Patient not taking: Reported on 05/06/2017 12/03/16   Jonetta Osgood, MD  folic acid (FOLVITE) 1 MG tablet Take 1 tablet (1 mg total) by mouth daily. 05/07/17   [name], Evie Lacks, MD  furosemide (LASIX) 40 MG tablet Take 1 tablet (40 mg total) by mouth daily. 05/07/17   [name], Evie Lacks, MD  levothyroxine (SYNTHROID, LEVOTHROID) 75 MCG tablet Take 1 tablet (75 mcg total) by mouth daily before breakfast. 05/07/17   [name], Evie Lacks, MD  memantine (NAMENDA) 10 MG tablet Take 1 tablet (10 mg total) by mouth daily. 05/07/17   [name], Evie Lacks, MD  metoprolol tartrate (LOPRESSOR) 25 MG tablet TAKE 2 TABLETS (50 MG TOTAL) BY MOUTH 2 (TWO) TIMES DAILY. 07/01/17   [name], Evie Lacks, MD  pantoprazole (PROTONIX) 40 MG tablet Take 1 tablet (40 mg total) by mouth daily. 05/07/17   [name], Evie Lacks, MD  potassium chloride (K-DUR) 10 MEQ tablet Take 2 tablets (20 mEq total) by mouth daily. 05/07/17   [name], Evie Lacks, MD  Rivaroxaban (XARELTO) 15 MG TABS tablet Take 1 tablet (15 mg total) by mouth daily with supper. 05/07/17   [name], Evie Lacks, MD  FLUoxetine (PROZAC) 20 MG tablet Take 1 tablet (20 mg total) by mouth  daily. 08/11/11 12/03/11  [name], Evie Lacks, MD     Critical care time: 62mins      Katy Maelle Sheaffer, NP 03/15/2019  4:03 PM Pager: [phone] or [phone]

## 2019-03-15 NOTE — Progress Notes (Signed)
Patient arrived from [facility]. Admitting MD paged. Assessment complete. Waiting on new orders. Pt stable at this time. Family has been notified of her arrival and that they would not be allowed to visit at this time. Michele Diaz, Rande Brunt, RN

## 2019-03-15 NOTE — Consult Note (Signed)
NEURO HOSPITALIST CONSULT NOTE   Requestig physician: Dr. Lynetta Mare  Reason for Consult: Small thalamic hemorrhage  History obtained from:    Chart   HPI:                                                                                                                                          Michele Diaz is an 85 y.o. female who presented to [facility] today with a left posterior thalamic hemorrhage measuring 2-3 cm in size. Per chart review, the thalamic hemorrhage is associated with some intraventricular extension. The patient is cognitively impaired and perseverating with answers to questions, unable to provide an account of her recent symptoms. She was on Xarelto for atrial fibrillation at home. At the OSH her anticoagulation was reversed with Kcentra and vitamin K. Her ICH score is 1. Neurosurgery has seen the patient and determined that she is not currently a surgical candidate. She has been admitted to the Critical Care service.   Past Medical History:  Diagnosis Date  . Allergic rhinitis   . Anxiety   . Arthritis    "hips, hands, feet; pretty much feels like all over" (01/07/2013)  . Asthma   . Breast cancer ([place]) ~ 2008   "right" (01/07/2013)  . Chronic lower back pain   . Daily headache    "related to temporal arteritis" (01/07/2013)  . Depression   . Diastolic dysfunction   . Diverticulitis of colon   . Fibromyalgia   . GERD (gastroesophageal reflux disease)   . Giant cell arteritis ([place]) 2003   devashwar  . Hepatitis, autoimmune ([place])   . Herpes zoster 2011  . Hyperlipidemia    statins increased LFTs  . Hypertension   . Hypothyroid   . Persistent atrial fibrillation ([place])    a. s/p TEE/DCCV in 01/2016  . Stroke ([place])   . Temporal arteritis ([place])   . Unspecified vitamin D deficiency   . Weight loss 6/11    Past Surgical History:  Procedure Laterality Date  . ABDOMINAL HYSTERECTOMY  1988  . APPENDECTOMY    . BREAST BIOPSY Right   .  BREAST LUMPECTOMY Right   . CARDIOVERSION N/A 02/06/2016   Procedure: CARDIOVERSION;  Surgeon: Skeet Latch, MD;  Location: [facility];  Service: Cardiovascular;  Laterality: N/A;  . CHOLECYSTECTOMY    . DILATION AND CURETTAGE OF UTERUS    . TEE WITHOUT CARDIOVERSION N/A 02/06/2016   Procedure: TRANSESOPHAGEAL ECHOCARDIOGRAM (TEE);  Surgeon: Skeet Latch, MD;  Location: [facility] ENDOSCOPY;  Service: Cardiovascular;  Laterality: N/A;  . TONSILLECTOMY      Family History  Problem Relation Age of Onset  . Diabetes Mother   . Heart attack Mother        died of MI at age 24  .  Cancer Brother   . Heart disease Brother   . Cancer Sister   . Heart disease Sister        MI at age 41  . Alzheimer's disease Sister   . Hypertension Other               Social History:  reports that she has never smoked. She has never used smokeless tobacco. She reports that she does not drink alcohol or use drugs.  Allergies  Allergen Reactions  . Atorvastatin Other (See Comments)    Elevated liver enzymes  . Celecoxib Shortness Of Breath, Itching and Rash    redness  . Rosuvastatin Other (See Comments)    Elevated liver enzymes  . Codeine Nausea Only  . Penicillins Hives    HOME MEDICATIONS:                                                                                                                       ROS:                                                                                                                                       Unable to obtain due to confusion.    Blood pressure (!) 139/98, pulse 88, temperature 98.4 F (36.9 C), temperature source Oral, resp. rate 15, height 5' 2.5" (1.588 m), weight 63.5 kg, SpO2 97 %.   General Examination:                                                                                                       Physical Exam  HEENT-  Stem/AT    Lungs- Respirations unlabored Extremities- Warm and well perfused.   Neurological Examination  Mental Status: Awake and alert. Confused. Not oriented. Perseverates in response to different questions. Speech is fluent. Has comprehension deficit. No dysarthria.  Cranial Nerves: II: Visual fields with intact blink to threat inconsistently on the right, consistently blinks on the left. PERRL.   III,IV,  VI: ptosis not present, EOMI but with some hesitancy on gazing to the right. Saccadic quality of visual pursuits is also noted.  V,VII: Subtle right facial droop. Facial temp sensation subjectively normal bilaterally VIII: hearing intact to voice IX,X: No hypophonia XI: bilateral shoulder shrug is symmetric. Tends to preferentially rotate her head to the left.  XII: midline tongue extension Motor: Right : Upper extremity   4+/5    Left:     Upper extremity   5/5  Lower extremity   5/5     Lower extremity   5/5 Normal tone throughout; no atrophy noted Sensory: Temp and light touch subjectively intact in all 4 extremities.  Deep Tendon Reflexes: 2+ bilateral brachioradialis and biceps. 3+ patellae bilaterally  Plantars: Right: Upgoing   Left: downgoing Cerebellar: Unable to fully follow instructions for finger-to-nose with each upper extremity. No gross ataxia noted.  Gait: Deferred   Lab Results: Basic Metabolic Panel: No results for input(s): NA, K, CL, CO2, GLUCOSE, BUN, CREATININE, CALCIUM, MG, PHOS in the last 168 hours.  CBC: No results for input(s): WBC, NEUTROABS, HGB, HCT, MCV, PLT in the last 168 hours.  Cardiac Enzymes: No results for input(s): CKTOTAL, CKMB, CKMBINDEX, TROPONINI in the last 168 hours.  Lipid Panel: No results for input(s): CHOL, TRIG, HDL, CHOLHDL, VLDL, LDLCALC in the last 168 hours.  Imaging: No results found.  Assessment: 85 year old female with acute left thalamic hemorrhage.  1. Most likely due to combined etiology of anticoagulant effect in addition to hypertensive bleed based on the location 2. Has had anticoagulaton reversed at OSH with  [place]. Also received vitamin K.  3. Exam reveals some right sided neglect. No motor weakness noted.   Recommendations: 1. Agree with current ICU plan of no anticoagulants or antiplatelet agents.  2. DVT prophylaxis with SCDs 3. Not a surgical candidate per Neurosurgery 4. BP management with SBP goal of < 140 5. MRI brain to assess for possible underlying lesion. 6. If ICH is stable on repeat CT head in 7-14 days, can start her on ASA for stroke prevention given her atrial fibrillation. Likely will no longer be a candidate for anticoagulation.  7. IV hydration 8. PT/OT/Speech    Electronically signed: Dr. Kerney Elbe 03/15/2019, 6:22 PM

## 2019-03-15 NOTE — Consult Note (Signed)
Reason for Consult: Intracerebral hemorrhage basal ganglia left posterior thalamus Referring Physician: Dr. [name] is an 85 y.o. female.  HPI: 85 year old female taken to [facility] regional today with altered mental status head CT revealed a thalamic intracerebral hemorrhage with a minimal amount of intraventricular extension but also atrial fibrillation.  Patient resides in a nursing home has multiple medical problems was on Xarelto however patient was reversed at [facility] with Kcentra and vitamin K currently the patient is confused but denies significant headache or nausea  Past Medical History:  Diagnosis Date  . Allergic rhinitis   . Anxiety   . Arthritis    "hips, hands, feet; pretty much feels like all over" (01/07/2013)  . Asthma   . Breast cancer ([place]) ~ 2008   "right" (01/07/2013)  . Chronic lower back pain   . Daily headache    "related to temporal arteritis" (01/07/2013)  . Depression   . Diastolic dysfunction   . Diverticulitis of colon   . Fibromyalgia   . GERD (gastroesophageal reflux disease)   . Giant cell arteritis ([place]) 2003   devashwar  . Hepatitis, autoimmune ([place])   . Herpes zoster 2011  . Hyperlipidemia    statins increased LFTs  . Hypertension   . Hypothyroid   . Persistent atrial fibrillation ([place])    a. s/p TEE/DCCV in 01/2016  . Stroke ([place])   . Temporal arteritis ([place])   . Unspecified vitamin D deficiency   . Weight loss 6/11    Past Surgical History:  Procedure Laterality Date  . ABDOMINAL HYSTERECTOMY  1988  . APPENDECTOMY    . BREAST BIOPSY Right   . BREAST LUMPECTOMY Right   . CARDIOVERSION N/A 02/06/2016   Procedure: CARDIOVERSION;  Surgeon: Skeet Latch, MD;  Location: [facility];  Service: Cardiovascular;  Laterality: N/A;  . CHOLECYSTECTOMY    . DILATION AND CURETTAGE OF UTERUS    . TEE WITHOUT CARDIOVERSION N/A 02/06/2016   Procedure: TRANSESOPHAGEAL ECHOCARDIOGRAM (TEE);  Surgeon: Skeet Latch, MD;   Location: [facility] ENDOSCOPY;  Service: Cardiovascular;  Laterality: N/A;  . TONSILLECTOMY      Family History  Problem Relation Age of Onset  . Diabetes Mother   . Heart attack Mother        died of MI at age 45  . Cancer Brother   . Heart disease Brother   . Cancer Sister   . Heart disease Sister        MI at age 47  . Alzheimer's disease Sister   . Hypertension Other     Social History:  reports that she has never smoked. She has never used smokeless tobacco. She reports that she does not drink alcohol or use drugs.  Allergies:  Allergies  Allergen Reactions  . Atorvastatin Other (See Comments)    Elevated liver enzymes  . Celecoxib Shortness Of Breath, Itching and Rash    redness  . Rosuvastatin Other (See Comments)    Elevated liver enzymes  . Codeine Nausea Only  . Penicillins Hives    Medications: I have reviewed the patient's current medications.  No results found for this or any previous visit (from the past 48 hour(s)).  No results found.  Review of Systems  Unable to perform ROS: Critical illness   Blood pressure (!) 131/94, pulse (!) 121, temperature 98.4 F (36.9 C), temperature source Oral, resp. rate (!) 27, height 5' 2.5" (1.588 m), weight 63.5 kg, SpO2 96 %. Physical Exam  Neurological: She is alert. She has normal strength. She is disoriented. GCS eye subscore is 4. GCS verbal subscore is 5. GCS motor subscore is 6.  Patient is awake and alert she is dysarthric and she has a little bit of a mixed dysphasia.  Pupils are equal strength 5 out of 5 upper and lower extremities no pronator drift    Assessment/Plan: 85 year old female with a small 2 to 3 cm left posterior thalamic hemorrhage with minimal intraventricular extension and no hydrocephalus.  This is nonsurgical agree with rapid reversal of her anticoagulation and repeat CT scan and 12 hours.  Strict blood pressure management per neurology critical care.  Be available for sedation should the patient  develop hydrocephalus.  Would not recommend open surgical evacuation of the blood clot only treatment of symptomatic hydrocephalus if needed.  Tyronn Golda P 03/15/2019, 3:32 PM

## 2019-03-15 NOTE — Progress Notes (Signed)
Noted that BP medications have goal SBP<160, yet Dr. Yvetta Coder note specified SBP goal <140.  Notified Dr. Rory Percy to clarify. Confirmed that SBP goal<140. Will adjust orders to match.

## 2019-03-16 ENCOUNTER — Inpatient Hospital Stay (HOSPITAL_COMMUNITY): Payer: Medicare Other

## 2019-03-16 DIAGNOSIS — I639 Cerebral infarction, unspecified: Secondary | ICD-10-CM

## 2019-03-16 DIAGNOSIS — I161 Hypertensive emergency: Secondary | ICD-10-CM

## 2019-03-16 DIAGNOSIS — I615 Nontraumatic intracerebral hemorrhage, intraventricular: Principal | ICD-10-CM

## 2019-03-16 LAB — BASIC METABOLIC PANEL
Anion gap: 13 (ref 5–15)
BUN: 12 mg/dL (ref 8–23)
CO2: 22 mmol/L (ref 22–32)
Calcium: 9 mg/dL (ref 8.9–10.3)
Chloride: 98 mmol/L (ref 98–111)
Creatinine, Ser: 0.95 mg/dL (ref 0.44–1.00)
GFR calc Af Amer: >60 mL/min (ref >60)
GFR calc non Af Amer: 55 mL/min — ABNORMAL LOW (ref >60)
Glucose, Bld: 98 mg/dL (ref 70–99)
Potassium: 4 mmol/L (ref 3.5–5.1)
Sodium: 133 mmol/L — ABNORMAL LOW (ref 135–145)

## 2019-03-16 LAB — CBC
HCT: 35 % — ABNORMAL LOW (ref 36.0–46.0)
Hemoglobin: 11.5 g/dL — ABNORMAL LOW (ref 12.0–15.0)
MCH: 28.7 pg (ref 26.0–34.0)
MCHC: 32.9 g/dL (ref 30.0–36.0)
MCV: 87.3 fL (ref 80.0–100.0)
Platelets: 213 10*3/uL (ref 150–400)
RBC: 4.01 MIL/uL (ref 3.87–5.11)
RDW: 14.4 % (ref 11.5–15.5)
WBC: 10.2 10*3/uL (ref 4.0–10.5)
nRBC: 0 % (ref 0.0–0.2)

## 2019-03-16 LAB — MAGNESIUM: Magnesium: 2 mg/dL (ref 1.7–2.4)

## 2019-03-16 LAB — PHOSPHORUS: Phosphorus: 4.7 mg/dL — ABNORMAL HIGH (ref 2.5–4.6)

## 2019-03-16 LAB — ECHOCARDIOGRAM COMPLETE
Height: 62.5 in
Weight: 2335.11 oz

## 2019-03-16 MED ORDER — METOPROLOL TARTRATE 5 MG/5ML IV SOLN
7.5000 mg | Freq: Four times a day (QID) | INTRAVENOUS | Status: DC | PRN
  Administered 2019-03-16: 7.5 mg via INTRAVENOUS
  Filled 2019-03-16: qty 10

## 2019-03-16 MED ORDER — AZATHIOPRINE 50 MG PO TABS
50.0000 mg | ORAL_TABLET | Freq: Every day | ORAL | Status: DC
  Administered 2019-03-16 – 2019-03-21 (×6): 50 mg via ORAL
  Filled 2019-03-16 (×6): qty 1

## 2019-03-16 MED ORDER — METOPROLOL TARTRATE 50 MG PO TABS: 50.0000 mg | ORAL_TABLET | Freq: Two times a day (BID) | ORAL | Status: DC

## 2019-03-16 MED ORDER — DILTIAZEM HCL ER COATED BEADS 120 MG PO CP24
120.0000 mg | ORAL_CAPSULE | Freq: Every day | ORAL | Status: DC
  Administered 2019-03-16 – 2019-03-21 (×6): 120 mg via ORAL
  Filled 2019-03-16 (×6): qty 1

## 2019-03-16 MED ORDER — METOPROLOL TARTRATE 25 MG PO TABS
25.0000 mg | ORAL_TABLET | Freq: Two times a day (BID) | ORAL | Status: DC
  Administered 2019-03-17: 25 mg via ORAL
  Filled 2019-03-16 (×2): qty 1

## 2019-03-16 MED ORDER — LABETALOL HCL 5 MG/ML IV SOLN
10.0000 mg | Freq: Once | INTRAVENOUS | Status: AC
  Administered 2019-03-16: 10 mg via INTRAVENOUS
  Filled 2019-03-16: qty 4

## 2019-03-16 MED ORDER — VITAMIN D 25 MCG (1000 UNIT) PO TABS
1000.0000 [IU] | ORAL_TABLET | Freq: Every day | ORAL | Status: DC
  Administered 2019-03-17 – 2019-03-21 (×5): 1000 [IU] via ORAL
  Filled 2019-03-16 (×6): qty 1

## 2019-03-16 MED ORDER — FOLIC ACID 1 MG PO TABS
1.0000 mg | ORAL_TABLET | Freq: Every day | ORAL | Status: DC
  Administered 2019-03-16 – 2019-03-21 (×6): 1 mg via ORAL
  Filled 2019-03-16 (×6): qty 1

## 2019-03-16 MED ORDER — CLEVIDIPINE BUTYRATE 0.5 MG/ML IV EMUL: 0.0000 mg/h | INTRAVENOUS | Status: DC

## 2019-03-16 MED ORDER — PANTOPRAZOLE SODIUM 40 MG PO TBEC
40.0000 mg | DELAYED_RELEASE_TABLET | Freq: Every day | ORAL | Status: DC
  Administered 2019-03-17 – 2019-03-21 (×5): 40 mg via ORAL
  Filled 2019-03-16 (×6): qty 1

## 2019-03-16 MED ORDER — LEVOTHYROXINE SODIUM 75 MCG PO TABS
75.0000 ug | ORAL_TABLET | Freq: Every day | ORAL | Status: DC
  Administered 2019-03-17 – 2019-03-21 (×5): 75 ug via ORAL
  Filled 2019-03-16 (×5): qty 1

## 2019-03-16 MED ORDER — PERFLUTREN LIPID MICROSPHERE
1.0000 mL | INTRAVENOUS | Status: AC | PRN
  Administered 2019-03-16: 2 mL via INTRAVENOUS
  Filled 2019-03-16: qty 10

## 2019-03-16 NOTE — Evaluation (Signed)
Speech Language Pathology Evaluation Patient Details Name: Michele Diaz MRN: [id number] DOB: 08-27-1932 Today's Date: 03/16/2019 Time: 3716-9678 SLP Time Calculation (min) (ACUTE ONLY): 19 min  Problem List:  Patient Active Problem List   Diagnosis Date Noted  . ICH (intracerebral hemorrhage) ([place]) 03/15/2019  . Acute metabolic encephalopathy 93/81/0175  . Acute on chronic diastolic heart failure ([place]) 11/29/2016  . CHF (congestive heart failure) ([place]) 11/28/2016  . Confusion 11/28/2016  . Acute on chronic diastolic CHF (congestive heart failure) ([place]) 11/28/2016  . Autoimmune hepatitis ([place]) 11/28/2016  . Contusion of multiple sites of right shoulder 08/27/2016  . Pleural effusion 02/13/2016  . Positive D dimer 02/13/2016  . Anemia 02/13/2016  . H/O: CVA (cerebrovascular accident) 02/13/2016  . Prolonged Q-T interval on ECG 02/03/2016  . Atrial fibrillation with rapid ventricular response ([place]) 02/03/2016  . Cataract 03/20/2014  . Epistaxis 08/22/2013  . Wart viral 04/24/2013  . Actinic keratoses 04/22/2013  . Stroke ([place]) 01/07/2013  . Well adult exam 06/01/2012  . Headache(784.0) 03/02/2012  . Grief reaction 09/19/2011  . Memory loss 05/26/2011  . Chest pain 04/24/2011  . Chronic hyponatremia 07/16/2010  . DEPRESSION/ANXIETY 04/03/2010  . Fatigue 03/06/2010  . WEIGHT LOSS 03/06/2010  . VERTIGO 10/23/2009  . LIVER FUNCTION TESTS, ABNORMAL, HX OF 08/17/2009  . CHEST WALL PAIN 04/06/2008  . GERD 10/15/2007  . Hypothyroidism, acquired 07/19/2007  . Essential hypertension 07/19/2007  . ALLERGIC RHINITIS 07/19/2007  . DIVERTICULOSIS, COLON 07/19/2007  . OSTEOARTHRITIS 07/19/2007  . BREAST CANCER, HX OF 07/19/2007  . VITAMIN D DEFICIENCY 06/09/2007  . Polymyalgia rheumatica ([place]) 06/09/2007   Past Medical History:  Past Medical History:  Diagnosis Date  . Allergic rhinitis   . Anxiety   . Arthritis    "hips, hands, feet; pretty much feels like all over" (01/07/2013)   . Asthma   . Breast cancer ([place]) ~ 2008   "right" (01/07/2013)  . Chronic lower back pain   . Daily headache    "related to temporal arteritis" (01/07/2013)  . Depression   . Diastolic dysfunction   . Diverticulitis of colon   . Fibromyalgia   . GERD (gastroesophageal reflux disease)   . Giant cell arteritis ([place]) 2003   devashwar  . Hepatitis, autoimmune ([place])   . Herpes zoster 2011  . Hyperlipidemia    statins increased LFTs  . Hypertension   . Hypothyroid   . Persistent atrial fibrillation ([place])    a. s/p TEE/DCCV in 01/2016  . Stroke ([place])   . Temporal arteritis ([place])   . Unspecified vitamin D deficiency   . Weight loss 6/11   Past Surgical History:  Past Surgical History:  Procedure Laterality Date  . ABDOMINAL HYSTERECTOMY  1988  . APPENDECTOMY    . BREAST BIOPSY Right   . BREAST LUMPECTOMY Right   . CARDIOVERSION N/A 02/06/2016   Procedure: CARDIOVERSION;  Surgeon: Skeet Latch, MD;  Location: [facility];  Service: Cardiovascular;  Laterality: N/A;  . CHOLECYSTECTOMY    . DILATION AND CURETTAGE OF UTERUS    . TEE WITHOUT CARDIOVERSION N/A 02/06/2016   Procedure: TRANSESOPHAGEAL ECHOCARDIOGRAM (TEE);  Surgeon: Skeet Latch, MD;  Location: [facility] ENDOSCOPY;  Service: Cardiovascular;  Laterality: N/A;  . TONSILLECTOMY     HPI:  Pt is an 85 year old woman with history of atrial fibrillation on Xarelto, stroke, temporal arteritis, GERD, fibromyalgia, diastolic dysfunction, hypertension who presented to [facility] hospital on 6/30 with altered mental status and aphasia. CT of the head revealed left  thalamic intracerebral hemorrhage with intraventricular extension. ICH score was 1. She was transferred to [facility] for neurosurgery evaluation.    Assessment / Plan / Recommendation Clinical Impression  Pt presents with moderate fluent aphasia characterized by production of fluent speech with reduced meaning and impairments in comprehension. Receptively she  was able to answer simple yes/no questions but exhibited difficulty with reading comprehension, answering complex yes/no questions, and following 2-step commands. With regards to verbal expression produced fluent utterances with reduced meaning but demonstrated difficulty with naming, sentence repetition, and production of automatic sequences. Perseveration of phrases and sentences was noted, and neologistic paraphasias were inconsistently observed. Pt's cognition could not be reliably assessed due to the pt's receptive and expressive language deficits. Skilled SLP services are clinically indicated at this time to target aphasia.     SLP Assessment  SLP Recommendation/Assessment: Patient needs continued Speech Lanaguage Pathology Services SLP Visit Diagnosis: Aphasia (R47.01)    Follow Up Recommendations  Inpatient Rehab (CIR referral deferred until pt is evaluated by PT and OT)   Frequency and Duration min 2x/week  2 weeks      SLP Evaluation Cognition  Overall Cognitive Status: Difficult to assess(Due to aphasia) Arousal/Alertness: Awake/alert Orientation Level: Oriented to person       Comprehension  Auditory Comprehension Overall Auditory Comprehension: Impaired Yes/No Questions: Impaired Basic Biographical Questions: (4/5) Complex Questions: (2/5) Commands: Impaired One Step Basic Commands: (3/4) Two Step Basic Commands: (0/4) Conversation: Simple Reading Comprehension Reading Status: Impaired Word level: Within functional limits Sentence Level: Impaired    Expression Expression Primary Mode of Expression: Verbal Verbal Expression Overall Verbal Expression: Impaired Initiation: Impaired Automatic Speech: Counting;Day of week(Counting: 0/10; with cues: 2/10; days: 0/7 despite cues) Level of Generative/Spontaneous Verbalization: Sentence Repetition: Impaired Level of Impairment: Word level;Phrase level Naming: Impairment Responsive: (1/4) Confrontation:  Impaired(6/10) Convergent: (Sentence completion: 4/5) Divergent: Not tested Verbal Errors: Perseveration;Neologisms;Not aware of errors Pragmatics: No impairment   Oral / Motor  Oral Motor/Sensory Function Overall Oral Motor/Sensory Function: Within functional limits Motor Speech Overall Motor Speech: Appears within functional limits for tasks assessed Respiration: Within functional limits Phonation: Normal Resonance: Within functional limits Articulation: Within functional limitis Intelligibility: Intelligible Motor Planning: Witnin functional limits Motor Speech Errors: Not applicable   Derwin Reddy I. Hardin Negus, [place], [place] Office number [phone] Pager [place] 03/16/2019, 4:15 PM

## 2019-03-16 NOTE — Progress Notes (Signed)
Patient HR still fluctuating to 120-130s. PCCM notified.

## 2019-03-16 NOTE — Progress Notes (Signed)
NAME:  Michele Diaz, MRN:  [id number], DOB:  12-02-1931, LOS: 1 ADMISSION DATE:  03/15/2019, CONSULTATION DATE:  6/30 REFERRING MD:  OSH, CHIEF COMPLAINT:  ICH   Brief History   85 year old female with history of atrial fibrillation on Xarelto stroke, temporal arteritis, GERD, fibromyalgia, diastolic dysfunction, hypertension who presented to [facility] on 6/30 with altered mental status.  Head CT revealed thalamic intracerebral hemorrhage with minimal intraventricular extension.  She was given Kcentra and vitamin K.  She was transferred to [facility] for neurosurgery evaluation.  She remains confused but is protecting her airway currently.  CCM was consulted for ICU admission for close neurologic monitoring.  History of present illness   85 year old female with history of atrial fibrillation on Xarelto stroke, temporal arteritis, GERD, fibromyalgia, diastolic dysfunction, hypertension who presented to [facility] on 6/30 with altered mental status.  Head CT revealed thalamic intracerebral hemorrhage with minimal intraventricular extension.  She was given Kcentra and vitamin K.  She was transferred to [facility] for neurosurgery evaluation.  She remains confused but is protecting her airway currently.  CCM was consulted for ICU admission for close neurologic monitoring.  Past Medical History   Past Medical History:  Diagnosis Date  . Allergic rhinitis   . Anxiety   . Arthritis    "hips, hands, feet; pretty much feels like all over" (01/07/2013)  . Asthma   . Breast cancer ([place]) ~ 2008   "right" (01/07/2013)  . Chronic lower back pain   . Daily headache    "related to temporal arteritis" (01/07/2013)  . Depression   . Diastolic dysfunction   . Diverticulitis of colon   . Fibromyalgia   . GERD (gastroesophageal reflux disease)   . Giant cell arteritis ([place]) 2003   devashwar  . Hepatitis, autoimmune ([place])   . Herpes zoster 2011  . Hyperlipidemia    statins increased LFTs  . Hypertension   . Hypothyroid   . Persistent atrial fibrillation ([place])    a. s/p TEE/DCCV in 01/2016  . Stroke ([place])   . Temporal arteritis ([place])   . Unspecified vitamin D deficiency   . Weight loss 6/11     Significant Hospital Events     Consults:  Neurosurgery [facility]) 6/30 >>  Procedures:    Significant Diagnostic Tests:  CT head (outside hospital) 6/30 >>reportedly Intracerebral hemorrhage basal ganglia left posterior thalamus  Repeat CT 7/1 stable  Micro Data:    Antimicrobials:     Interim history/subjective:   No overnight events, pt reports mild headache, no palpitations or lightheadedness/dizziness, etc. Objective   Blood pressure 101/63, pulse (!) 119, temperature 100 F (37.8 C), temperature source Axillary, resp. rate (!) 24, height 5' 2.5" (1.588 m), weight 66.2 kg, SpO2 99 %.       No intake or output data in the 24 hours ending 03/16/19 0857 Filed Weights   03/15/19 1453 03/16/19 0500  Weight: 63.5 kg 66.2 kg    Examination: General:  NAD  HENT: AT/[place], mm moist,  Lungs: CTAB Cardiovascular: irreg irreg variable but HR ~100; no r/m/g Abdomen: Soft, nontender Extremities: Dry, no edema Neuro: A/O x3; CNII-XII grossly intact to confrontation.  Follows commands, answers questions appropriately. MAE on request though somewhat weakly  Resolved Hospital Problem list     Assessment & Plan:  ICH- 2 to 3 cm left posterior thalamic bleed in the setting of Xarelto K Centra and vitamin K given at outside hospital Plan- Neurosurgery following  Continue close monitoring in neuro ICU; can transfer to floor later today if continues stable Blood pressure management No surgical intervention recommended per neurosurgery Repeat CT scan-stable  Afib/HTN  Plan -  Strict BP control  Will restart diltizem this morning for BP and better rate control. Goal SBP<160 PRN metoprolol, hydralazine for BP >160 or HR >110   Best practice:   Diet: N.p.o. for now Pain/Anxiety/Delirium protocol (if indicated): N/A VAP protocol (if indicated): n/A DVT prophylaxis: SCD's GI prophylaxis: n/a Glucose control: n/a Mobility: BR Code Status: full Family Communication: will reach out to family  Disposition:   Labs   CBC: Recent Labs  Lab 03/16/19 0528  WBC 10.2  HGB 11.5*  HCT 35.0*  MCV 87.3  PLT 169    Basic Metabolic Panel: Recent Labs  Lab 03/16/19 0528  NA 133*  K 4.0  CL 98  CO2 22  GLUCOSE 98  BUN 12  CREATININE 0.95  CALCIUM 9.0  MG 2.0  PHOS 4.7*   GFR: Estimated Creatinine Clearance: 39.9 mL/min (by C-G formula based on SCr of 0.95 mg/dL). Recent Labs  Lab 03/16/19 0528  WBC 10.2    Liver Function Tests: No results for input(s): AST, ALT, ALKPHOS, BILITOT, PROT, ALBUMIN in the last 168 hours. No results for input(s): LIPASE, AMYLASE in the last 168 hours. No results for input(s): AMMONIA in the last 168 hours.  ABG    Component Value Date/Time   TCO2 30 01/07/2013 1407     Coagulation Profile: No results for input(s): INR, PROTIME in the last 168 hours.  Cardiac Enzymes: No results for input(s): CKTOTAL, CKMB, CKMBINDEX, TROPONINI in the last 168 hours.  HbA1C: Hgb A1c MFr Bld  Date/Time Value Ref Range Status  01/08/2013 06:45 AM 5.3 <5.7 % Final    Comment:    (NOTE)                                                                       According to the ADA Clinical Practice Recommendations for 2011, when HbA1c is used as a screening test:  >=6.5%   Diagnostic of Diabetes Mellitus           (if abnormal result is confirmed) 5.7-6.4%   Increased risk of developing Diabetes Mellitus References:Diagnosis and Classification of Diabetes Mellitus,Diabetes CVEL,3810,17(PZWCH 1):S62-S69 and Standards of Medical Care in         Diabetes - 2011,Diabetes ENID,7824,23 (Suppl 1):S11-S61.    CBG: No results for input(s): GLUCAP in the last 168 hours.  Review of Systems:   As per HPI  above obtained from staff and records otherwise neg   Past Medical History  She,  has a past medical history of Allergic rhinitis, Anxiety, Arthritis, Asthma, Breast cancer ([place]) (~ 2008), Chronic lower back pain, Daily headache, Depression, Diastolic dysfunction, Diverticulitis of colon, Fibromyalgia, GERD (gastroesophageal reflux disease), Giant cell arteritis ([place]) (2003), Hepatitis, autoimmune ([place]), Herpes zoster (2011), Hyperlipidemia, Hypertension, Hypothyroid, Persistent atrial fibrillation ([place]), Stroke ([place]), Temporal arteritis ([place]), Unspecified vitamin D deficiency, and Weight loss (6/11).   Surgical History    Past Surgical History:  Procedure Laterality Date  . ABDOMINAL HYSTERECTOMY  1988  . APPENDECTOMY    . BREAST BIOPSY Right   . BREAST LUMPECTOMY  Right   . CARDIOVERSION N/A 02/06/2016   Procedure: CARDIOVERSION;  Surgeon: Skeet Latch, MD;  Location: [facility] ENDOSCOPY;  Service: Cardiovascular;  Laterality: N/A;  . CHOLECYSTECTOMY    . DILATION AND CURETTAGE OF UTERUS    . TEE WITHOUT CARDIOVERSION N/A 02/06/2016   Procedure: TRANSESOPHAGEAL ECHOCARDIOGRAM (TEE);  Surgeon: Skeet Latch, MD;  Location: [facility] ENDOSCOPY;  Service: Cardiovascular;  Laterality: N/A;  . TONSILLECTOMY       Social History   reports that she has never smoked. She has never used smokeless tobacco. She reports that she does not drink alcohol or use drugs.   Family History   Her family history includes Alzheimer's disease in her sister; Cancer in her brother and sister; Diabetes in her mother; Heart attack in her mother; Heart disease in her brother and sister; Hypertension in an other family member.   Allergies Allergies  Allergen Reactions  . Atorvastatin Other (See Comments)    Elevated liver enzymes  . Celecoxib Shortness Of Breath, Itching and Rash    redness  . Rosuvastatin Other (See Comments)    Elevated liver enzymes  . Codeine Nausea Only  . Penicillins Hives     Home  Medications  Prior to Admission medications   Medication Sig Start Date End Date Taking? Authorizing Provider  albuterol (PROVENTIL HFA;VENTOLIN HFA) 108 (90 Base) MCG/ACT inhaler Inhale 1-2 puffs into the lungs every 6 (six) hours as needed for wheezing or shortness of breath. Patient not taking: Reported on 05/06/2017 11/05/16   [name], Evie Lacks, MD  azaTHIOprine (IMURAN) 50 MG tablet TAKE 1 TABLET (50 MG TOTAL) BY MOUTH DAILY. 12/02/16   [name], Evie Lacks, MD  cholecalciferol (VITAMIN D) 1000 UNITS tablet Take 1 tablet (1,000 Units total) by mouth daily. 01/14/13   Angiulli, Lavon Paganini, PA-C  diltiazem (CARDIZEM CD) 120 MG 24 hr capsule Take 1 capsule (120 mg total) by mouth daily. 05/07/17   [name], Evie Lacks, MD  escitalopram (LEXAPRO) 10 MG tablet Take 1 tablet (10 mg total) by mouth daily. 05/07/17   [name], Evie Lacks, MD  feeding supplement, ENSURE ENLIVE, (ENSURE ENLIVE) LIQD Take 237 mLs by mouth 2 (two) times daily between meals. Patient not taking: Reported on 05/06/2017 12/03/16   Jonetta Osgood, MD  folic acid (FOLVITE) 1 MG tablet Take 1 tablet (1 mg total) by mouth daily. 05/07/17   [name], Evie Lacks, MD  furosemide (LASIX) 40 MG tablet Take 1 tablet (40 mg total) by mouth daily. 05/07/17   [name], Evie Lacks, MD  levothyroxine (SYNTHROID, LEVOTHROID) 75 MCG tablet Take 1 tablet (75 mcg total) by mouth daily before breakfast. 05/07/17   [name], Evie Lacks, MD  memantine (NAMENDA) 10 MG tablet Take 1 tablet (10 mg total) by mouth daily. 05/07/17   [name], Evie Lacks, MD  metoprolol tartrate (LOPRESSOR) 25 MG tablet TAKE 2 TABLETS (50 MG TOTAL) BY MOUTH 2 (TWO) TIMES DAILY. 07/01/17   [name], Evie Lacks, MD  pantoprazole (PROTONIX) 40 MG tablet Take 1 tablet (40 mg total) by mouth daily. 05/07/17   [name], Evie Lacks, MD  potassium chloride (K-DUR) 10 MEQ tablet Take 2 tablets (20 mEq total) by mouth daily. 05/07/17   [name], Evie Lacks, MD  Rivaroxaban (XARELTO) 15 MG  TABS tablet Take 1 tablet (15 mg total) by mouth daily with supper. 05/07/17   [name], Evie Lacks, MD  FLUoxetine (PROZAC) 20 MG tablet Take 1 tablet (20 mg total) by mouth daily. 08/11/11 12/03/11  [name], Evie Lacks, MD  I have independently seen and examined the patient, reviewed data, and developed an assessment and plan. A total of 34 minutes were spent in critical care assessment and medical decision making. This critical care time does not reflect procedure time, or teaching time or supervisory time of PA/NP/Med student/Med Resident, etc but could involve care discussion time.   Bonna Gains, MD PhD 03/16/19 9:05 AM

## 2019-03-16 NOTE — Progress Notes (Signed)
eLink Physician-Brief Progress Note Patient Name: Michele Diaz DOB: [birth year] MRN: [id number]   Date of Service  03/16/2019  HPI/Events of Note  Afib RVR not responsive to 7.5 mg of Lopressor iv, HR above 120 bpm  eICU Interventions  Amiodarone 150 mg iv x 1        Jenina Moening U Dash Cardarelli 03/16/2019, 2:14 AM

## 2019-03-16 NOTE — Progress Notes (Addendum)
  Speech Language Pathology Treatment: Cognitive-Linquistic(Aphasia )  Patient Details Name: Michele Diaz MRN: [id number] DOB: 03/20/1932 Today's Date: 03/16/2019 Time: 1546-1600 SLP Time Calculation (min) (ACUTE ONLY): 14 min  Assessment / Plan / Recommendation Clinical Impression  Pt was seen for aphasia treatment and was cooperative during the session but the session was abbreviated due to her increased difficulty maintaining alertness as the session progressed. She achieved 20% accuracy with confrontational naming increasing to 80% accuracy with orthographic and semantic cues. She demonstrated 20% accuracy with sentence completion increasing to 40% with mod cues. She answered complex yes/no questions with 40% accuracy increasing to 60% with rephrasing. SLP will continue to follow pt.    HPI HPI: Pt is an 85 year old woman with history of atrial fibrillation on Xarelto, stroke, temporal arteritis, GERD, fibromyalgia, diastolic dysfunction, hypertension who presented to [facility] on 6/30 with altered mental status and aphasia. CT of the head revealed left thalamic intracerebral hemorrhage with intraventricular extension. ICH score was 1. She was transferred to [facility] for neurosurgery evaluation.       SLP Plan  Continue with current plan of care  Patient needs continued Speech Lanaguage Pathology Services    Recommendations                   Follow up Recommendations: Inpatient Rehab (CIR referral deferred until pt is evaluated by PT and OT) SLP Visit Diagnosis: Aphasia (R47.01) Plan: Continue with current plan of care       Channel Papandrea I. Hardin Negus, [place], [place] Office number [phone] Pager [place] 03/16/2019, 4:46 PM

## 2019-03-16 NOTE — Progress Notes (Addendum)
Called Elink regarding patient's elevated HR and BP sys>140) at 0100.   Neuro MD paged regarding patient's BP >140 at 0115.  RN was given orders to give prn labetalol, and if BP still was above parameters to start cleviprex.  Had to wait for orders to transfer and pharmacy to verify.  Will give new prn asap.

## 2019-03-16 NOTE — Progress Notes (Signed)
STROKE TEAM PROGRESS NOTE   INTERVAL HISTORY Pt RN at bedside. Pt lying in bed, awake alert, however, disorientated with mild aphasia. BP stable on lower end, however, still has afib with RVR, HR 100-110s. Resumed home cardizem. Passed swallow and will put back other home meds.   Vitals:   03/16/19 0935 03/16/19 1000 03/16/19 1100 03/16/19 1200  BP: 115/80 116/82 107/79 112/70  Pulse:  (!) 107 (!) 121 (!) 114  Resp:  (!) 28 (!) 22 (!) 23  Temp:    98.4 F (36.9 C)  TempSrc:    Oral  SpO2:  97% 96% 97%  Weight:      Height:        CBC:  Recent Labs  Lab 03/16/19 0528  WBC 10.2  HGB 11.5*  HCT 35.0*  MCV 87.3  PLT 956    Basic Metabolic Panel:  Recent Labs  Lab 03/16/19 0528  NA 133*  K 4.0  CL 98  CO2 22  GLUCOSE 98  BUN 12  CREATININE 0.95  CALCIUM 9.0  MG 2.0  PHOS 4.7*   Lipid Panel:     Component Value Date/Time   CHOL 178 01/08/2013 0645   TRIG 226 (H) 01/08/2013 0645   HDL 58 01/08/2013 0645   CHOLHDL 3.1 01/08/2013 0645   VLDL 45 (H) 01/08/2013 0645   LDLCALC 75 01/08/2013 0645   HgbA1c:  Lab Results  Component Value Date   HGBA1C 5.3 01/08/2013   Urine Drug Screen:     Component Value Date/Time   LABOPIA NONE DETECTED 01/07/2013 1626   COCAINSCRNUR NONE DETECTED 01/07/2013 1626   LABBENZ NONE DETECTED 01/07/2013 1626   AMPHETMU NONE DETECTED 01/07/2013 1626   THCU NONE DETECTED 01/07/2013 1626   LABBARB NONE DETECTED 01/07/2013 1626    Alcohol Level     Component Value Date/Time   ETH <11 01/07/2013 1322    IMAGING Ct Head Wo Contrast  Addendum Date: 03/16/2019   ADDENDUM REPORT: 03/16/2019 01:14 ADDENDUM: Recent comparison film done at [facility] on 03/15/2019 has become available. The left thalamic hemorrhage and intraventricular hemorrhage are stable. No new hemorrhage or significant change since prior study. Electronically Signed   By: Rolm Baptise M.D.   On: 03/16/2019 01:14   Result Date: 03/16/2019 CLINICAL  DATA:  Intracerebral hemorrhage, follow-up EXAM: CT HEAD WITHOUT CONTRAST TECHNIQUE: Contiguous axial images were obtained from the base of the skull through the vertex without intravenous contrast. COMPARISON:  MRI 11/29/2016.  No recent studies. FINDINGS: Brain: There is a left thalamic acute hemorrhage measuring 2.5 x 1.7 x 2.7 cm. Intraventricular extension with blood noted in the left lateral ventricle. No hydrocephalus. Chronic small vessel disease throughout the deep white matter. Mild cerebral atrophy. No mass effect or midline shift. Vascular: No hyperdense vessel or unexpected calcification. Skull: No acute calvarial abnormality. Sinuses/Orbits: No acute finding Other: None IMPRESSION: Left thalamic intracerebral hemorrhage with intraventricular extension. No hydrocephalus. Atrophy, chronic small vessel disease. Electronically Signed: By: Rolm Baptise M.D. On: 03/16/2019 00:53    PHYSICAL EXAM  Temp:  [97.6 F (36.4 C)-100 F (37.8 C)] 98.4 F (36.9 C) (07/01 1200) Pulse Rate:  [41-165] 114 (07/01 1200) Resp:  [15-29] 23 (07/01 1200) BP: (83-165)/(56-113) 112/70 (07/01 1200) SpO2:  [91 %-100 %] 97 % (07/01 1200) Weight:  [63.5 kg-66.2 kg] 66.2 kg (07/01 0500)  General - Well nourished, well developed, in no apparent distress.  Ophthalmologic - fundi not visualized due to noncooperation.  Cardiovascular -  irregularly irregular heart rate and rhythm with RVR.  Neuro - awake alert, eyes open, able to tell me her name and place but disorientated to age or time or situation. She was able to name 1/4, not able to repeat, moderate expressive aphasia with frequent paraphasic errors. Able to follow most simple commands. PERRL, no gaze deviation. Visual field full. Mild right facial droop. Tongue midline. Moving all extremities symmetrically. Sensation subjectively symmetric. Coordination not cooperative. Gait not tested.    ASSESSMENT/PLAN [name] is a 85 y.o. female with history  of AF on Xarelto, prior stroke, temporal arteritis, breast cancer, GERD, fibromyalgia, diastolic dysfunction and hypertension presenting to [facility] with altered mental status.  CT showed left thalamic hemorrhage.   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 Small vessel disease. Atrophy.   CT head repeat stable ICH, IVH   CTA head & neck pending in am   2D Echo EF 55-60%   LDL pending   HgbA1c pending   SCDs for VTE prophylaxis  Xarelto (rivaroxaban) daily prior to admission, now on No antithrombotic given hemorrhage.   Therapy recommendations:  pending   Disposition:  pending   Atrial Fibrillation with intermittent RVR  Home anticoagulation:  Xarelto (rivaroxaban) daily   Treated with Lopressor and amiodarone during the night for rate control  Xarelto reversed with Kcentra and vitamin K  Not AC candidate now. May consider DOAC once hematoma resolved in 3-4 weeks.   Hx stroke/TIA  12/2012 -L subcortical infarct due to SVD.  Changed aspirin to Plavix.  Discharge to CIR.  Hypertensive emergency  Blood pressure as high as 165/105 on admission   Home meds: Diltiazem 120, Lasix 40, metoprolol 50 twice daily  Stable now . SBP goal less than 140 . Treated with labetalol PRN with good results . Long-term BP goal normotensive  Other Stroke Risk Factors  Advanced age  Diastolic dysfunction on Lasix  Other Active Problems  Baseline cognitive deficit on Namenda prior to admission   History of breast cancer  History temporal arteritis with daily headaches  Herpes zoster  Hepatitis, autoimmune  Hypothyroidism on Synthroid  GERD on [facility] day # 1  This patient is critically ill due to [place] with IVH, A. fib RVR, hypertensive emergency and at  significant risk of neurological worsening, death form hematoma expansion, obstructive hydrocephalus, heart failure, hypertensive encephalopathy. This patient's care requires constant monitoring of vital signs, hemodynamics, respiratory and cardiac monitoring, review of multiple databases, neurological assessment, discussion with family, other specialists and medical decision making of high complexity. I spent 35 minutes of neurocritical care time in the care of this patient.  Rosalin Hawking, MD PhD Stroke Neurology 03/16/2019 5:51 PM   To contact Stroke Continuity provider, please refer to [email or web address] After hours, contact General Neurology

## 2019-03-16 NOTE — Progress Notes (Signed)
eLink Physician-Brief Progress Note Patient Name: Michele Diaz DOB: 10-27-1931 MRN: [id number]   Date of Service  03/16/2019  HPI/Events of Note  Pt has atrial fibrillation, heart rate is staying above 120 bpm  eICU Interventions  Increase Lopressor to 7.5 mg iv Q 6 hours prn heart rate > 120        Okoron[place]wo U Ogan 03/16/2019, 1:34 AM

## 2019-03-16 NOTE — Progress Notes (Signed)
  Echocardiogram 2D Echocardiogram has been performed.  Burnett Kanaris 03/16/2019, 10:47 AM

## 2019-03-17 ENCOUNTER — Encounter (HOSPITAL_COMMUNITY): Payer: Self-pay | Admitting: Radiology

## 2019-03-17 ENCOUNTER — Inpatient Hospital Stay (HOSPITAL_COMMUNITY): Payer: Medicare Other

## 2019-03-17 DIAGNOSIS — I610 Nontraumatic intracerebral hemorrhage in hemisphere, subcortical: Secondary | ICD-10-CM

## 2019-03-17 DIAGNOSIS — I509 Heart failure, unspecified: Secondary | ICD-10-CM

## 2019-03-17 DIAGNOSIS — I10 Essential (primary) hypertension: Secondary | ICD-10-CM

## 2019-03-17 DIAGNOSIS — E785 Hyperlipidemia, unspecified: Secondary | ICD-10-CM

## 2019-03-17 DIAGNOSIS — I4891 Unspecified atrial fibrillation: Secondary | ICD-10-CM

## 2019-03-17 LAB — BASIC METABOLIC PANEL
Anion gap: 11 (ref 5–15)
BUN: 16 mg/dL (ref 8–23)
CO2: 23 mmol/L (ref 22–32)
Calcium: 8.9 mg/dL (ref 8.9–10.3)
Chloride: 99 mmol/L (ref 98–111)
Creatinine, Ser: 1.06 mg/dL — ABNORMAL HIGH (ref 0.44–1.00)
GFR calc Af Amer: 56 mL/min — ABNORMAL LOW (ref >60)
GFR calc non Af Amer: 48 mL/min — ABNORMAL LOW (ref >60)
Glucose, Bld: 121 mg/dL — ABNORMAL HIGH (ref 70–99)
Potassium: 4 mmol/L (ref 3.5–5.1)
Sodium: 133 mmol/L — ABNORMAL LOW (ref 135–145)

## 2019-03-17 LAB — LIPID PANEL
Cholesterol: 182 mg/dL (ref 0–200)
HDL: 44 mg/dL (ref >40)
LDL Cholesterol: 123 mg/dL — ABNORMAL HIGH (ref 0–99)
Total CHOL/HDL Ratio: 4.1 RATIO
Triglycerides: 75 mg/dL (ref <150)
VLDL: 15 mg/dL (ref 0–40)

## 2019-03-17 LAB — HEMOGLOBIN A1C
Hgb A1c MFr Bld: 5.5 % (ref 4.8–5.6)
Mean Plasma Glucose: 111.15 mg/dL

## 2019-03-17 LAB — CBC
HCT: 34.5 % — ABNORMAL LOW (ref 36.0–46.0)
Hemoglobin: 11.1 g/dL — ABNORMAL LOW (ref 12.0–15.0)
MCH: 28.5 pg (ref 26.0–34.0)
MCHC: 32.2 g/dL (ref 30.0–36.0)
MCV: 88.7 fL (ref 80.0–100.0)
Platelets: 219 10*3/uL (ref 150–400)
RBC: 3.89 MIL/uL (ref 3.87–5.11)
RDW: 14.6 % (ref 11.5–15.5)
WBC: 8.5 10*3/uL (ref 4.0–10.5)
nRBC: 0 % (ref 0.0–0.2)

## 2019-03-17 MED ORDER — METOPROLOL TARTRATE 50 MG PO TABS
50.0000 mg | ORAL_TABLET | Freq: Two times a day (BID) | ORAL | Status: DC
  Administered 2019-03-17 – 2019-03-21 (×9): 50 mg via ORAL
  Filled 2019-03-17 (×9): qty 1

## 2019-03-17 MED ORDER — FUROSEMIDE 40 MG PO TABS
40.0000 mg | ORAL_TABLET | Freq: Every day | ORAL | Status: DC
  Administered 2019-03-17 – 2019-03-20 (×4): 40 mg via ORAL
  Filled 2019-03-17 (×4): qty 1

## 2019-03-17 MED ORDER — IOHEXOL 350 MG/ML SOLN
75.0000 mL | Freq: Once | INTRAVENOUS | Status: AC | PRN
  Administered 2019-03-17: 75 mL via INTRAVENOUS

## 2019-03-17 MED ORDER — SODIUM CHLORIDE 0.9 % IV SOLN
INTRAVENOUS | Status: DC
  Administered 2019-03-17 – 2019-03-19 (×5): via INTRAVENOUS

## 2019-03-17 MED ORDER — HYDRALAZINE HCL 20 MG/ML IJ SOLN: 10.0000 mg | Freq: Four times a day (QID) | INTRAMUSCULAR | Status: DC | PRN

## 2019-03-17 NOTE — Progress Notes (Signed)
Pt has left for CT scan

## 2019-03-17 NOTE — Progress Notes (Signed)
STROKE TEAM PROGRESS NOTE   INTERVAL HISTORY Pt lying in bed, initially sleeping but easily arousable and following all simple commands, orientated to self and place but still has expressive aphasia.   Vitals:   03/17/19 0411 03/17/19 0500 03/17/19 0832 03/17/19 1200  BP: (!) 144/92  (!) 138/117 134/72  Pulse: (!) 107  (!) 129 77  Resp: 17  18 18   Temp: 98.7 F (37.1 C)  97.8 F (36.6 C)   TempSrc:   Oral   SpO2: 100%  98%   Weight:  63.1 kg    Height:        CBC:  Recent Labs  Lab 03/16/19 0528 03/17/19 0429  WBC 10.2 8.5  HGB 11.5* 11.1*  HCT 35.0* 34.5*  MCV 87.3 88.7  PLT 213 588    Basic Metabolic Panel:  Recent Labs  Lab 03/16/19 0528 03/17/19 0429  NA 133* 133*  K 4.0 4.0  CL 98 99  CO2 22 23  GLUCOSE 98 121*  BUN 12 16  CREATININE 0.95 1.06*  CALCIUM 9.0 8.9  MG 2.0  --   PHOS 4.7*  --    Lipid Panel:     Component Value Date/Time   CHOL 182 03/17/2019 0429   TRIG 75 03/17/2019 0429   HDL 44 03/17/2019 0429   CHOLHDL 4.1 03/17/2019 0429   VLDL 15 03/17/2019 0429   LDLCALC 123 (H) 03/17/2019 0429   HgbA1c:  Lab Results  Component Value Date   HGBA1C 5.5 03/17/2019   Urine Drug Screen:     Component Value Date/Time   LABOPIA NONE DETECTED 01/07/2013 1626   COCAINSCRNUR NONE DETECTED 01/07/2013 1626   LABBENZ NONE DETECTED 01/07/2013 1626   AMPHETMU NONE DETECTED 01/07/2013 1626   THCU NONE DETECTED 01/07/2013 1626   LABBARB NONE DETECTED 01/07/2013 1626    Alcohol Level     Component Value Date/Time   ETH <11 01/07/2013 1322    IMAGING Ct Angio Head W Or Wo Contrast  Result Date: 03/17/2019 CLINICAL DATA:  85 y/o  F; intracranial hemorrhage for follow-up EXAM: CT ANGIOGRAPHY HEAD AND NECK TECHNIQUE: Multidetector CT imaging of the head and neck was performed using the standard protocol during bolus administration of intravenous contrast. Multiplanar CT image reconstructions and MIPs were obtained to evaluate the vascular anatomy.  Carotid stenosis measurements (when applicable) are obtained utilizing NASCET criteria, using the distal internal carotid diameter as the denominator. CONTRAST:  77mL OMNIPAQUE IOHEXOL 350 MG/ML SOLN COMPARISON:  03/16/2019 CT head FINDINGS: CT HEAD FINDINGS Brain: Stable acute hemorrhage within left thalamus measuring 17 x 25 mm (AP by ML series 4, image 17). Stable small volume intraventricular hemorrhage in left lateral ventricle. No new acute intracranial hemorrhage, mass effect, extra-axial collection, or findings of acute stroke. Stable small chronic infarct within the left superior cerebellum. Stable nonspecific white matter hypodensities compatible chronic microvascular ischemic changes. Stable volume loss of the brain. Vascular: As below. Skull: Normal. Negative for fracture or focal lesion. Sinuses: Small mucous retention cysts within the left maxillary sinus. Trace fluid level within the sphenoid sinus. Normal aeration of the additional included paranasal sinuses and the mastoid air cells. Orbits: Bilateral intra-ocular lens replacement. Review of the MIP images confirms the above findings CTA NECK FINDINGS Aortic arch: Standard branching. Imaged portion shows no evidence of aneurysm or dissection. No significant stenosis of the major arch vessel origins. Mild aortic calcific atherosclerosis. Right carotid system: No evidence of dissection, stenosis (50% or greater) or occlusion. Mild non  stenotic calcific atherosclerosis of carotid bifurcation. Left carotid system: No evidence of dissection, stenosis (50% or greater) or occlusion. Mild non stenotic calcific atherosclerosis of carotid bifurcation. Vertebral arteries: Left dominant. No evidence of dissection, stenosis (50% or greater) or occlusion. Skeleton: No acute fracture. C2-3 small central disc protrusion effacing the ventral thecal sac. Other neck: Negative. Upper chest: Right lung apex pleuroparenchymal scarring Review of the MIP images confirms  the above findings CTA HEAD FINDINGS Anterior circulation: No significant stenosis, proximal occlusion, aneurysm, or vascular malformation. Posterior circulation: No significant stenosis, proximal occlusion, aneurysm, or vascular malformation. Venous sinuses: As permitted by contrast timing, patent. Anatomic variants: Bilateral fetal PCA. Review of the MIP images confirms the above findings IMPRESSION: 1. Stable left thalamus acute hemorrhage and left lateral ventricle intraventricular hemorrhage. No new acute intracranial abnormality on noncontrast CT of head. 2. Patent carotid and vertebral arteries. No dissection, aneurysm, or hemodynamically significant stenosis utilizing NASCET criteria. 3. Patent anterior and posterior intracranial circulation. No vascular malformation, large vessel occlusion, aneurysm, or significant stenosis. Electronically Signed   By: Kristine Garbe M.D.   On: 03/17/2019 01:25   Ct Head Wo Contrast  Addendum Date: 03/16/2019   ADDENDUM REPORT: 03/16/2019 01:14 ADDENDUM: Recent comparison film done at [facility] on 03/15/2019 has become available. The left thalamic hemorrhage and intraventricular hemorrhage are stable. No new hemorrhage or significant change since prior study. Electronically Signed   By: Rolm Baptise M.D.   On: 03/16/2019 01:14   Result Date: 03/16/2019 CLINICAL DATA:  Intracerebral hemorrhage, follow-up EXAM: CT HEAD WITHOUT CONTRAST TECHNIQUE: Contiguous axial images were obtained from the base of the skull through the vertex without intravenous contrast. COMPARISON:  MRI 11/29/2016.  No recent studies. FINDINGS: Brain: There is a left thalamic acute hemorrhage measuring 2.5 x 1.7 x 2.7 cm. Intraventricular extension with blood noted in the left lateral ventricle. No hydrocephalus. Chronic small vessel disease throughout the deep white matter. Mild cerebral atrophy. No mass effect or midline shift. Vascular: No hyperdense vessel or  unexpected calcification. Skull: No acute calvarial abnormality. Sinuses/Orbits: No acute finding Other: None IMPRESSION: Left thalamic intracerebral hemorrhage with intraventricular extension. No hydrocephalus. Atrophy, chronic small vessel disease. Electronically Signed: By: Rolm Baptise M.D. On: 03/16/2019 00:53   Ct Angio Neck W Or Wo Contrast  Result Date: 03/17/2019 CLINICAL DATA:  85 y/o  F; intracranial hemorrhage for follow-up EXAM: CT ANGIOGRAPHY HEAD AND NECK TECHNIQUE: Multidetector CT imaging of the head and neck was performed using the standard protocol during bolus administration of intravenous contrast. Multiplanar CT image reconstructions and MIPs were obtained to evaluate the vascular anatomy. Carotid stenosis measurements (when applicable) are obtained utilizing NASCET criteria, using the distal internal carotid diameter as the denominator. CONTRAST:  64mL OMNIPAQUE IOHEXOL 350 MG/ML SOLN COMPARISON:  03/16/2019 CT head FINDINGS: CT HEAD FINDINGS Brain: Stable acute hemorrhage within left thalamus measuring 17 x 25 mm (AP by ML series 4, image 17). Stable small volume intraventricular hemorrhage in left lateral ventricle. No new acute intracranial hemorrhage, mass effect, extra-axial collection, or findings of acute stroke. Stable small chronic infarct within the left superior cerebellum. Stable nonspecific white matter hypodensities compatible chronic microvascular ischemic changes. Stable volume loss of the brain. Vascular: As below. Skull: Normal. Negative for fracture or focal lesion. Sinuses: Small mucous retention cysts within the left maxillary sinus. Trace fluid level within the sphenoid sinus. Normal aeration of the additional included paranasal sinuses and the mastoid air cells. Orbits: Bilateral intra-ocular lens replacement.  Review of the MIP images confirms the above findings CTA NECK FINDINGS Aortic arch: Standard branching. Imaged portion shows no evidence of aneurysm or  dissection. No significant stenosis of the major arch vessel origins. Mild aortic calcific atherosclerosis. Right carotid system: No evidence of dissection, stenosis (50% or greater) or occlusion. Mild non stenotic calcific atherosclerosis of carotid bifurcation. Left carotid system: No evidence of dissection, stenosis (50% or greater) or occlusion. Mild non stenotic calcific atherosclerosis of carotid bifurcation. Vertebral arteries: Left dominant. No evidence of dissection, stenosis (50% or greater) or occlusion. Skeleton: No acute fracture. C2-3 small central disc protrusion effacing the ventral thecal sac. Other neck: Negative. Upper chest: Right lung apex pleuroparenchymal scarring Review of the MIP images confirms the above findings CTA HEAD FINDINGS Anterior circulation: No significant stenosis, proximal occlusion, aneurysm, or vascular malformation. Posterior circulation: No significant stenosis, proximal occlusion, aneurysm, or vascular malformation. Venous sinuses: As permitted by contrast timing, patent. Anatomic variants: Bilateral fetal PCA. Review of the MIP images confirms the above findings IMPRESSION: 1. Stable left thalamus acute hemorrhage and left lateral ventricle intraventricular hemorrhage. No new acute intracranial abnormality on noncontrast CT of head. 2. Patent carotid and vertebral arteries. No dissection, aneurysm, or hemodynamically significant stenosis utilizing NASCET criteria. 3. Patent anterior and posterior intracranial circulation. No vascular malformation, large vessel occlusion, aneurysm, or significant stenosis. Electronically Signed   By: Kristine Garbe M.D.   On: 03/17/2019 01:25    PHYSICAL EXAM  Temp:  [97.8 F (36.6 C)-99.3 F (37.4 C)] 97.8 F (36.6 C) (07/02 0832) Pulse Rate:  [77-129] 77 (07/02 1200) Resp:  [16-25] 18 (07/02 1200) BP: (98-144)/(65-117) 134/72 (07/02 1200) SpO2:  [95 %-100 %] 98 % (07/02 0832) Weight:  [63.1 kg-64.8 kg] 63.1 kg  (07/02 0500)  General - Well nourished, well developed, in no apparent distress.  Ophthalmologic - fundi not visualized due to noncooperation.  Cardiovascular - irregularly irregular heart rate and rhythm with RVR.  Neuro - easily arousable, eyes open, able to tell me her name and place but disorientated to age or time or situation. She was able to name 1/4, not able to repeat, moderate expressive aphasia with frequent paraphasic errors. Able to follow all simple commands. PERRL, no gaze deviation. Visual field full. Mild right facial droop. Tongue midline. Moving all extremities symmetrically. Sensation subjectively symmetric. Coordination intact but slow action. Gait not tested.    ASSESSMENT/PLAN [name] is a 85 y.o. female with history of AF on Xarelto, prior stroke, temporal arteritis, breast cancer, GERD, fibromyalgia, diastolic dysfunction and hypertension presenting to [facility] with altered mental status.  CT showed left thalamic hemorrhage.   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 Small vessel disease. Atrophy.   CT head repeat stable ICH, IVH   CTA head & neck 7/2 stable L thalamic ICH and L IVH. No AVM or aneurysm  2D Echo EF 55-60%.  No source of embolus  LDL 123  HgbA1c 5.5  SCDs for VTE prophylaxis  Xarelto (rivaroxaban) daily prior to admission, now on No antithrombotic for now given hemorrhage.   Therapy recommendations: CIR  Disposition:  pending   Atrial Fibrillation with intermittent RVR  Home anticoagulation:  Xarelto (rivaroxaban) daily   Treated with Lopressor and amiodarone during the night  for rate control  Xarelto reversed with Kcentra and vitamin K  Not AC candidate now. May consider DOAC once hematoma resolved in 3-4 weeks.   Rate not  in good control, increase metoprolol to 50mg  bid  Continue cardizem 120mg    Resume lasix  Hx stroke/TIA  12/2012 -L subcortical infarct due to SVD.  Changed aspirin to Plavix.  Discharge to CIR.  Hypertensive emergency  Blood pressure as high as 165/105 on admission   Home meds: Diltiazem 120, Lasix 40, metoprolol 50 twice daily  Stable now . SBP goal less than 140 . Treated with labetalol PRN with good results . Now on cardizem, lasix and metoprolol . Long-term BP goal normotensive  Hyperlipidemia   Home meds: No statin   LDL 123, goal <7.0   Intolerance to atorvastatin and rosuvastatin with elevated LFTs  Other Stroke Risk Factors  Advanced age  Diastolic dysfunction on Lasix  Other Active Problems  Baseline cognitive deficit on Namenda prior to admission   History of breast cancer  History temporal arteritis with daily headaches  Herpes zoster  Hepatitis, autoimmune  Hypothyroidism on Synthroid  GERD on [facility] day # 2   Rosalin Hawking, MD PhD Stroke Neurology 03/17/2019 2:12 PM   To contact Stroke Continuity provider, please refer to [email or web address] After hours, contact General Neurology

## 2019-03-17 NOTE — Progress Notes (Signed)
  Speech Language Pathology Treatment: Cognitive-Linquistic(Aphasia )  Patient Details Name: Michele Diaz MRN: [id number] DOB: 1932/01/04 Today's Date: 03/17/2019 Time: [id number] SLP Time Calculation (min) (ACUTE ONLY): 20 min  Assessment / Plan / Recommendation Clinical Impression  Pt was seen for aphasia treatment and was cooperative during the session. Her overall language skills were improved compared to that which was demonstrated yesterday. She demonstrated reduced difficulty with formulation of meaningful sentences and she exhibited slightly increased awareness of her errors during this session. She achieved 90% accuracy with confrontational naming and 70% accuracy and sentence completion. She completed 1-step commands with 100% accuracy but demonstrated 20% accuracy with 2-step commands increasing to 60% with mod cues and repetition. She provided 6 items during divergent naming with mod support. SLP will continue to follow pt.    HPI HPI: Pt is an 85 year old woman with history of atrial fibrillation on Xarelto, stroke, temporal arteritis, GERD, fibromyalgia, diastolic dysfunction, hypertension who presented to [facility] on 6/30 with altered mental status and aphasia. CT of the head revealed left thalamic intracerebral hemorrhage with intraventricular extension. ICH score was 1. She was transferred to [facility] for neurosurgery evaluation.       SLP Plan  Continue with current plan of care       Recommendations                   Follow up Recommendations: Inpatient Rehab pending evaluation of PT/OT SLP Visit Diagnosis: Aphasia (R47.01) Plan: Continue with current plan of care       Kassi Esteve I. Hardin Negus, [place], [place] Office number [phone] Pager [phone]                 Horton Marshall 03/17/2019, 3:35 PM

## 2019-03-17 NOTE — Plan of Care (Signed)
Progressing towards goals

## 2019-03-17 NOTE — Progress Notes (Signed)
NAME:  MALEIGH BAGOT, MRN:  [id number], DOB:  March 09, 1932, LOS: 2 ADMISSION DATE:  03/15/2019, CONSULTATION DATE:  6/30 REFERRING MD:  OSH, CHIEF COMPLAINT:  ICH   Brief History   85 year old female PMH atrial fibrillation on Xarelto stroke, temporal arteritis, GERD, fibromyalgia, diastolic dysfunction, hypertension who presented to [facility] on 6/30 with altered mental status.  Head CT revealed thalamic intracerebral hemorrhage with minimal intraventricular extension.  She was given Kcentra and vitamin K.  She was transferred to [facility] for neurosurgery evaluation.  She remained confused but was protecting her airway on arrival.  CCM was consulted for ICU admission for close neurologic monitoring.  History of present illness   85 year old female with history of atrial fibrillation on Xarelto stroke, temporal arteritis, GERD, fibromyalgia, diastolic dysfunction, hypertension who presented to [facility] on 6/30 with altered mental status.  Head CT revealed thalamic intracerebral hemorrhage with minimal intraventricular extension.  She was given Kcentra and vitamin K.  She was transferred to [facility] for neurosurgery evaluation.  She remained confused but was protecting her airway.    Significant Hospital Events   6/30>>transfer to [facility]  Consults:  Neurosurgery [facility]) 6/30  Procedures:  NA  Significant Diagnostic Tests:  CT head (outside hospital) 6/30 >>reportedly Intracerebral hemorrhage basal ganglia left posterior thalamus  Repeat CT 7/1 stable  CTA head/neck 7/2 "IMPRESSION: 1. Stable left thalamus acute hemorrhage and left lateral ventricle intraventricular hemorrhage. No new acute intracranial abnormality on noncontrast CT of head. 2. Patent carotid and vertebral arteries. No dissection, aneurysm, or hemodynamically significant stenosis utilizing NASCET criteria. 3. Patent anterior and posterior intracranial circulation. No vascular  malformation, large vessel occlusion, aneurysm, or significant stenosis."    Micro Data:  6/30 COVID 19 negative 6/30 MRSA surveillance negative   Antimicrobials:   Not indicated  Interim history/subjective:  Transferred to floor yesterday. No acute events overnight. Pleasantly confused this am.  No complaints.  Objective   Blood pressure (!) 138/117, pulse (!) 129, temperature 97.8 F (36.6 C), temperature source Oral, resp. rate 18, height 5\' 2"  (1.575 m), weight 63.1 kg, SpO2 98 %.       No intake or output data in the 24 hours ending 03/17/19 0924 Filed Weights   03/16/19 0500 03/16/19 2100 03/17/19 0500  Weight: 66.2 kg 64.8 kg 63.1 kg    Examination: General:  Well developed, well nourished, elderly lady HENT: Normocephalic. PERRL. MMM Lungs: BBS present, clear, FNL Cardiovascular: IRRR S1S2. No MRG  Abdomen: + BS x4. SNT/ND Extremities:MAE well, no edema Neuro: A&Ox2. Poor short term memory recall. Mild delay in expression and word finding. CN II-XII in tact.   Resolved Hospital Problem list     Assessment & Plan:  ICH- 2 to 3 cm left posterior thalamic bleed in the setting of Xarelto -CTA Head stable as above Plan- Neurosurgery following Continue neuro checks Goal SBP <140 No surgical intervention recommended per neurosurgery PT/OT when ok with NSGY SLP following-passed swallow eval  Chronic Afib/HTN  Plan -  Strict BP control  Continue home dose diltizem  Goal SBP<140 Increase PO metoprolol to 50mg  BID per home dose PRN metoprolol, hydralazine for BP >160 or HR >110 Am BMP/Mag Correct electrolytes prn   Best practice:  Diet: regular thin liquid  Pain/Anxiety/Delirium protocol (if indicated): N/A VAP protocol (if indicated): N/A DVT prophylaxis: SCD's. Chemoprophylaxis contraindicated GI prophylaxis: on chronic PPI Glucose control: has not required same. A1c 5.5  Mobility: BR. PT/OT when ok  with NSGY Code Status: full Family  Communication: will reach out to family  Disposition: transfer services to [facility]. D/W Dr Tawanna Solo. Pt may benefit from Rehab.    Labs   CBC: Recent Labs  Lab 03/16/19 0528 03/17/19 0429  WBC 10.2 8.5  HGB 11.5* 11.1*  HCT 35.0* 34.5*  MCV 87.3 88.7  PLT 213 423    Basic Metabolic Panel: Recent Labs  Lab 03/16/19 0528 03/17/19 0429  NA 133* 133*  K 4.0 4.0  CL 98 99  CO2 22 23  GLUCOSE 98 121*  BUN 12 16  CREATININE 0.95 1.06*  CALCIUM 9.0 8.9  MG 2.0  --   PHOS 4.7*  --    GFR: Estimated Creatinine Clearance: 34.5 mL/min (A) (by C-G formula based on SCr of 1.06 mg/dL (H)). Recent Labs  Lab 03/16/19 0528 03/17/19 0429  WBC 10.2 8.5     HbA1C: Hgb A1c MFr Bld  Date/Time Value Ref Range Status  03/17/2019 04:29 AM 5.5 4.8 - 5.6 % Final    Comment:    (NOTE) Pre diabetes:          5.7%-6.4% Diabetes:              >6.4% Glycemic control for   <7.0% adults with diabetes   01/08/2013 06:45 AM 5.3 <5.7 % Final    Comment:    (NOTE)                                                                       According to the ADA Clinical Practice Recommendations for 2011, when HbA1c is used as a screening test:  >=6.5%   Diagnostic of Diabetes Mellitus           (if abnormal result is confirmed) 5.7-6.4%   Increased risk of developing Diabetes Mellitus References:Diagnosis and Classification of Diabetes Mellitus,Diabetes NTIR,4431,54(MGQQP 1):S62-S69 and Standards of Medical Care in         Diabetes - 2011,Diabetes YPPJ,0932,67 (Suppl 1):S11-S61.    PCCM will sign off. Thank you for the opportunity to participate in this patient's care. Please contact if we can be of further assistance.  Francine Graven, MSN, AGACNP  [facility]

## 2019-03-18 DIAGNOSIS — E876 Hypokalemia: Secondary | ICD-10-CM

## 2019-03-18 DIAGNOSIS — E871 Hypo-osmolality and hyponatremia: Secondary | ICD-10-CM

## 2019-03-18 LAB — CBC
HCT: 33.7 % — ABNORMAL LOW (ref 36.0–46.0)
Hemoglobin: 10.9 g/dL — ABNORMAL LOW (ref 12.0–15.0)
MCH: 28.5 pg (ref 26.0–34.0)
MCHC: 32.3 g/dL (ref 30.0–36.0)
MCV: 88.2 fL (ref 80.0–100.0)
Platelets: 220 10*3/uL (ref 150–400)
RBC: 3.82 MIL/uL — ABNORMAL LOW (ref 3.87–5.11)
RDW: 14.6 % (ref 11.5–15.5)
WBC: 8.7 10*3/uL (ref 4.0–10.5)
nRBC: 0 % (ref 0.0–0.2)

## 2019-03-18 LAB — BASIC METABOLIC PANEL
Anion gap: 9 (ref 5–15)
BUN: 13 mg/dL (ref 8–23)
CO2: 25 mmol/L (ref 22–32)
Calcium: 8.7 mg/dL — ABNORMAL LOW (ref 8.9–10.3)
Chloride: 97 mmol/L — ABNORMAL LOW (ref 98–111)
Creatinine, Ser: 0.91 mg/dL (ref 0.44–1.00)
GFR calc Af Amer: >60 mL/min (ref >60)
GFR calc non Af Amer: 58 mL/min — ABNORMAL LOW (ref >60)
Glucose, Bld: 96 mg/dL (ref 70–99)
Potassium: 3.1 mmol/L — ABNORMAL LOW (ref 3.5–5.1)
Sodium: 131 mmol/L — ABNORMAL LOW (ref 135–145)

## 2019-03-18 LAB — MAGNESIUM: Magnesium: 1.9 mg/dL (ref 1.7–2.4)

## 2019-03-18 MED ORDER — POTASSIUM CHLORIDE CRYS ER 20 MEQ PO TBCR
40.0000 meq | EXTENDED_RELEASE_TABLET | Freq: Once | ORAL | Status: AC
  Administered 2019-03-18: 40 meq via ORAL
  Filled 2019-03-18: qty 2

## 2019-03-18 MED ORDER — POTASSIUM CHLORIDE CRYS ER 20 MEQ PO TBCR
20.0000 meq | EXTENDED_RELEASE_TABLET | Freq: Every day | ORAL | Status: DC
  Administered 2019-03-18 – 2019-03-21 (×4): 20 meq via ORAL
  Filled 2019-03-18 (×4): qty 1

## 2019-03-18 MED ORDER — MAGNESIUM SULFATE 4 GM/100ML IV SOLN
4.0000 g | Freq: Once | INTRAVENOUS | Status: AC
  Administered 2019-03-18: 4 g via INTRAVENOUS
  Filled 2019-03-18: qty 100

## 2019-03-18 NOTE — Progress Notes (Addendum)
CSW received a call back from [place], Engineer, site at [facility]. She asked what the therapy recommendation was and CSW went over therapy notes with her. She stated that she would have to look at the notes but from what she discussed with the CSW, SNF would be more appropriate. Home health would only come 1-2 times per week. Loma Sousa stated she would send the admissions director at [facility] an email to be on the look out for the referral.   Admissions Director, Loma Sousa. Phone: [phone] and Fax: [phone].   CSW faxed Loma Sousa the therapy notes for her to review. Loma Sousa will follow up with the patient's son and the CSW on Monday.   Domenic Schwab, MSW, [place]

## 2019-03-18 NOTE — Plan of Care (Signed)
Progressing towards goals

## 2019-03-18 NOTE — Progress Notes (Signed)
STROKE TEAM PROGRESS NOTE   INTERVAL HISTORY Pt sitting in chair, awake alert, no complains. And neuro stable. PT/OT now recommend SNF.    Vitals:   03/18/19 0500 03/18/19 0816 03/18/19 1219 03/18/19 1618  BP:  (!) 128/97 117/79 (P) 113/78  Pulse:  90 73 (P) 71  Resp:  18 18 (P) 18  Temp:  97.7 F (36.5 C) 97.7 F (36.5 C) (P) 98.3 F (36.8 C)  TempSrc:  Oral Oral (P) Oral  SpO2:  100% 97% (P) 94%  Weight: 64.3 kg     Height:        CBC:  Recent Labs  Lab 03/17/19 0429 03/18/19 0408  WBC 8.5 8.7  HGB 11.1* 10.9*  HCT 34.5* 33.7*  MCV 88.7 88.2  PLT 219 778    Basic Metabolic Panel:  Recent Labs  Lab 03/16/19 0528 03/17/19 0429 03/18/19 0408  NA 133* 133* 131*  K 4.0 4.0 3.1*  CL 98 99 97*  CO2 22 23 25   GLUCOSE 98 121* 96  BUN 12 16 13   CREATININE 0.95 1.06* 0.91  CALCIUM 9.0 8.9 8.7*  MG 2.0  --  1.9  PHOS 4.7*  --   --    Lipid Panel:     Component Value Date/Time   CHOL 182 03/17/2019 0429   TRIG 75 03/17/2019 0429   HDL 44 03/17/2019 0429   CHOLHDL 4.1 03/17/2019 0429   VLDL 15 03/17/2019 0429   LDLCALC 123 (H) 03/17/2019 0429   HgbA1c:  Lab Results  Component Value Date   HGBA1C 5.5 03/17/2019   Urine Drug Screen:     Component Value Date/Time   LABOPIA NONE DETECTED 01/07/2013 1626   COCAINSCRNUR NONE DETECTED 01/07/2013 1626   LABBENZ NONE DETECTED 01/07/2013 1626   AMPHETMU NONE DETECTED 01/07/2013 1626   THCU NONE DETECTED 01/07/2013 1626   LABBARB NONE DETECTED 01/07/2013 1626    Alcohol Level     Component Value Date/Time   ETH <11 01/07/2013 1322    IMAGING Ct Angio Head W Or Wo Contrast  Result Date: 03/17/2019 CLINICAL DATA:  85 y/o  F; intracranial hemorrhage for follow-up EXAM: CT ANGIOGRAPHY HEAD AND NECK TECHNIQUE: Multidetector CT imaging of the head and neck was performed using the standard protocol during bolus administration of intravenous contrast. Multiplanar CT image reconstructions and MIPs were obtained to  evaluate the vascular anatomy. Carotid stenosis measurements (when applicable) are obtained utilizing NASCET criteria, using the distal internal carotid diameter as the denominator. CONTRAST:  58mL OMNIPAQUE IOHEXOL 350 MG/ML SOLN COMPARISON:  03/16/2019 CT head FINDINGS: CT HEAD FINDINGS Brain: Stable acute hemorrhage within left thalamus measuring 17 x 25 mm (AP by ML series 4, image 17). Stable small volume intraventricular hemorrhage in left lateral ventricle. No new acute intracranial hemorrhage, mass effect, extra-axial collection, or findings of acute stroke. Stable small chronic infarct within the left superior cerebellum. Stable nonspecific white matter hypodensities compatible chronic microvascular ischemic changes. Stable volume loss of the brain. Vascular: As below. Skull: Normal. Negative for fracture or focal lesion. Sinuses: Small mucous retention cysts within the left maxillary sinus. Trace fluid level within the sphenoid sinus. Normal aeration of the additional included paranasal sinuses and the mastoid air cells. Orbits: Bilateral intra-ocular lens replacement. Review of the MIP images confirms the above findings CTA NECK FINDINGS Aortic arch: Standard branching. Imaged portion shows no evidence of aneurysm or dissection. No significant stenosis of the major arch vessel origins. Mild aortic calcific atherosclerosis. Right carotid system:  No evidence of dissection, stenosis (50% or greater) or occlusion. Mild non stenotic calcific atherosclerosis of carotid bifurcation. Left carotid system: No evidence of dissection, stenosis (50% or greater) or occlusion. Mild non stenotic calcific atherosclerosis of carotid bifurcation. Vertebral arteries: Left dominant. No evidence of dissection, stenosis (50% or greater) or occlusion. Skeleton: No acute fracture. C2-3 small central disc protrusion effacing the ventral thecal sac. Other neck: Negative. Upper chest: Right lung apex pleuroparenchymal scarring  Review of the MIP images confirms the above findings CTA HEAD FINDINGS Anterior circulation: No significant stenosis, proximal occlusion, aneurysm, or vascular malformation. Posterior circulation: No significant stenosis, proximal occlusion, aneurysm, or vascular malformation. Venous sinuses: As permitted by contrast timing, patent. Anatomic variants: Bilateral fetal PCA. Review of the MIP images confirms the above findings IMPRESSION: 1. Stable left thalamus acute hemorrhage and left lateral ventricle intraventricular hemorrhage. No new acute intracranial abnormality on noncontrast CT of head. 2. Patent carotid and vertebral arteries. No dissection, aneurysm, or hemodynamically significant stenosis utilizing NASCET criteria. 3. Patent anterior and posterior intracranial circulation. No vascular malformation, large vessel occlusion, aneurysm, or significant stenosis. Electronically Signed   By: Kristine Garbe M.D.   On: 03/17/2019 01:25   Ct Angio Neck W Or Wo Contrast  Result Date: 03/17/2019 CLINICAL DATA:  85 y/o  F; intracranial hemorrhage for follow-up EXAM: CT ANGIOGRAPHY HEAD AND NECK TECHNIQUE: Multidetector CT imaging of the head and neck was performed using the standard protocol during bolus administration of intravenous contrast. Multiplanar CT image reconstructions and MIPs were obtained to evaluate the vascular anatomy. Carotid stenosis measurements (when applicable) are obtained utilizing NASCET criteria, using the distal internal carotid diameter as the denominator. CONTRAST:  63mL OMNIPAQUE IOHEXOL 350 MG/ML SOLN COMPARISON:  03/16/2019 CT head FINDINGS: CT HEAD FINDINGS Brain: Stable acute hemorrhage within left thalamus measuring 17 x 25 mm (AP by ML series 4, image 17). Stable small volume intraventricular hemorrhage in left lateral ventricle. No new acute intracranial hemorrhage, mass effect, extra-axial collection, or findings of acute stroke. Stable small chronic infarct within the  left superior cerebellum. Stable nonspecific white matter hypodensities compatible chronic microvascular ischemic changes. Stable volume loss of the brain. Vascular: As below. Skull: Normal. Negative for fracture or focal lesion. Sinuses: Small mucous retention cysts within the left maxillary sinus. Trace fluid level within the sphenoid sinus. Normal aeration of the additional included paranasal sinuses and the mastoid air cells. Orbits: Bilateral intra-ocular lens replacement. Review of the MIP images confirms the above findings CTA NECK FINDINGS Aortic arch: Standard branching. Imaged portion shows no evidence of aneurysm or dissection. No significant stenosis of the major arch vessel origins. Mild aortic calcific atherosclerosis. Right carotid system: No evidence of dissection, stenosis (50% or greater) or occlusion. Mild non stenotic calcific atherosclerosis of carotid bifurcation. Left carotid system: No evidence of dissection, stenosis (50% or greater) or occlusion. Mild non stenotic calcific atherosclerosis of carotid bifurcation. Vertebral arteries: Left dominant. No evidence of dissection, stenosis (50% or greater) or occlusion. Skeleton: No acute fracture. C2-3 small central disc protrusion effacing the ventral thecal sac. Other neck: Negative. Upper chest: Right lung apex pleuroparenchymal scarring Review of the MIP images confirms the above findings CTA HEAD FINDINGS Anterior circulation: No significant stenosis, proximal occlusion, aneurysm, or vascular malformation. Posterior circulation: No significant stenosis, proximal occlusion, aneurysm, or vascular malformation. Venous sinuses: As permitted by contrast timing, patent. Anatomic variants: Bilateral fetal PCA. Review of the MIP images confirms the above findings IMPRESSION: 1. Stable left thalamus acute hemorrhage and  left lateral ventricle intraventricular hemorrhage. No new acute intracranial abnormality on noncontrast CT of head. 2. Patent  carotid and vertebral arteries. No dissection, aneurysm, or hemodynamically significant stenosis utilizing NASCET criteria. 3. Patent anterior and posterior intracranial circulation. No vascular malformation, large vessel occlusion, aneurysm, or significant stenosis. Electronically Signed   By: Kristine Garbe M.D.   On: 03/17/2019 01:25    PHYSICAL EXAM  Temp:  [97.5 F (36.4 C)-98.6 F (37 C)] (P) 98.3 F (36.8 C) (07/03 1618) Pulse Rate:  [71-126] (P) 71 (07/03 1618) Resp:  [16-18] (P) 18 (07/03 1618) BP: (107-130)/(62-97) (P) 113/78 (07/03 1618) SpO2:  [94 %-100 %] (P) 94 % (07/03 1618) Weight:  [64.3 kg] 64.3 kg (07/03 0500)  General - Well nourished, well developed, in no apparent distress.  Ophthalmologic - fundi not visualized due to noncooperation.  Cardiovascular - irregularly irregular heart rate and rhythm with RVR.  Neuro - easily arousable, eyes open, able to tell me her name and place but disorientated to age or time or situation. She was able to name 1/4, not able to repeat, moderate expressive aphasia with frequent paraphasic errors. Able to follow all simple commands. PERRL, no gaze deviation. Visual field full. Mild right facial droop. Tongue midline. Moving all extremities symmetrically. Sensation subjectively symmetric. Coordination intact but slow action. Gait not tested.    ASSESSMENT/PLAN [name] is a 85 y.o. female with history of AF on Xarelto, prior stroke, temporal arteritis, breast cancer, GERD, fibromyalgia, diastolic dysfunction and hypertension presenting to [facility] with altered mental status.  CT showed left thalamic hemorrhage.   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 Small vessel disease. Atrophy.   CT head repeat stable ICH, IVH    CTA head & neck 7/2 stable L thalamic ICH and L IVH. No AVM or aneurysm  2D Echo EF 55-60%.  No source of embolus  LDL 123  HgbA1c 5.5  SCDs for VTE prophylaxis  Xarelto (rivaroxaban) daily prior to admission, now on No antithrombotic for now given hemorrhage.   Therapy recommendations: SNF  Disposition:  pending   Atrial Fibrillation with intermittent RVR  Home anticoagulation:  Xarelto (rivaroxaban) daily   Treated with Lopressor and amiodarone during the night for rate control  Xarelto reversed with Kcentra and vitamin K  Not AC candidate now. May consider DOAC once hematoma resolved in 3-4 weeks.   Rate not in good control, increase metoprolol to 50mg  bid  Continue cardizem 120mg    Resume lasix  Hx stroke/TIA  12/2012 -L subcortical infarct due to SVD.  Changed aspirin to Plavix.  Discharge to CIR.  Hypertensive emergency  Blood pressure as high as 165/105 on admission   Home meds: Diltiazem 120, Lasix 40, metoprolol 50 twice daily  Stable now . SBP goal less than 160 . Treated with labetalol PRN with good results . Now on cardizem, lasix and metoprolol . Long-term BP goal normotensive  Hyperlipidemia   Home meds: No statin   LDL 123, goal <7.0   Intolerance to atorvastatin and rosuvastatin with elevated LFTs  Other Stroke Risk Factors  Advanced age  Diastolic dysfunction on Lasix  Other Active Problems  Baseline cognitive deficit on Namenda prior to admission   History of breast cancer  History temporal arteritis  with daily headaches  Herpes zoster  Hepatitis, autoimmune  Hypothyroidism on Synthroid  GERD on Protonix  Hypokalemia K 3.1 - supplement  Hyponatremia Na 133->131 - management per primary team  Hospital day # 3   Neurology will sign off. Please call with questions. Pt will follow up with stroke clinic NP at [facility] in about 3-4 weeks. Thanks for the consult.   Rosalin Hawking, MD PhD Stroke Neurology 03/18/2019 7:05  PM   To contact Stroke Continuity provider, please refer to [email or web address] After hours, contact General Neurology

## 2019-03-18 NOTE — Evaluation (Signed)
Physical Therapy Evaluation Patient Details Name: Michele Diaz MRN: [id number] DOB: July 04, 1932 Today's Date: 03/18/2019   History of Present Illness  85 year old female with history of atrial fibrillation on Xarelto stroke, temporal arteritis, GERD, fibromyalgia, diastolic dysfunction, hypertension who presented to [facility] on 6/30 with altered mental status.  Head CT revealed thalamic intracerebral hemorrhage with minimal intraventricular extension.  Clinical Impression  Patient presents with decreased independence with mobility due to decreased balance, decreased safety awareness, decreased R side awareness, decreased cognition and she will benefit from skilled PT in the acute setting to maximize mobility and safety and prior to d/c to SNF level rehab.  Apparently pt is from ALF and has SNF level of care at same facility.     Follow Up Recommendations SNF    Equipment Recommendations  Other (comment)(TBA)    Recommendations for Other Services       Precautions / Restrictions Precautions Precautions: Fall      Mobility  Bed Mobility               General bed mobility comments: up at EOB with OT upon my entry  Transfers Overall transfer level: Needs assistance Equipment used: Rolling walker (2 wheeled) Transfers: Sit to/from Stand Sit to Stand: Min assist;Min guard         General transfer comment: assist for balance from EOB, increased help from toilet in bathroom  Ambulation/Gait Ambulation/Gait assistance: Min guard;Min assist Gait Distance (Feet): 90 Feet Assistive device: None Gait Pattern/deviations: Step-through pattern;Wide base of support;Decreased stride length;Decreased step length - right     General Gait Details: some evidence of R inattention, leads with L side, some imbalance noted with wide BOS  Stairs            Wheelchair Mobility    Modified Rankin (Stroke Patients Only) Modified Rankin (Stroke Patients  Only) Pre-Morbid Rankin Score: Moderate disability Modified Rankin: Moderately severe disability     Balance Overall balance assessment: Needs assistance Sitting-balance support: Feet unsupported Sitting balance-Leahy Scale: Fair Sitting balance - Comments: leaning R a little on EOB, reaching for feet with minguard   Standing balance support: No upper extremity supported Standing balance-Leahy Scale: Fair Standing balance comment: standing to wash hands at sink unsupported, but imbalance with dynamic tasks                             Pertinent Vitals/Pain Pain Assessment: No/denies pain    Home Living Family/patient expects to be discharged to:: Assisted living                 Additional Comments: unable to obtain due to pt's aphasia    Prior Function                 Hand Dominance        Extremity/Trunk Assessment   Upper Extremity Assessment Upper Extremity Assessment: Defer to OT evaluation    Lower Extremity Assessment Lower Extremity Assessment: Generalized weakness       Communication   Communication: Expressive difficulties  Cognition Arousal/Alertness: Awake/alert Behavior During Therapy: Flat affect Overall Cognitive Status: Difficult to assess                                        General Comments General comments (skin integrity, edema, etc.): incontinent of urine with ambulation and not  aware, though stated she knew she had to go    Exercises     Assessment/Plan    PT Assessment Patient needs continued PT services  PT Problem List Decreased strength;Decreased mobility;Decreased cognition;Decreased safety awareness;Decreased knowledge of use of DME;Decreased balance       PT Treatment Interventions DME instruction;Therapeutic activities;Balance training;Patient/family education;Therapeutic exercise;Functional mobility training;Gait training    PT Goals (Current goals can be found in the Care Plan  section)  Acute Rehab PT Goals Patient Stated Goal: None stated PT Goal Formulation: Patient unable to participate in goal setting Time For Goal Achievement: 04/01/19 Potential to Achieve Goals: Fair    Frequency Min 3X/week   Barriers to discharge        Co-evaluation PT/OT/SLP Co-Evaluation/Treatment: Yes Reason for Co-Treatment: To address functional/ADL transfers PT goals addressed during session: Mobility/safety with mobility;Balance         AM-PAC PT "6 Clicks" Mobility  Outcome Measure Help needed turning from your back to your side while in a flat bed without using bedrails?: A Little Help needed moving from lying on your back to sitting on the side of a flat bed without using bedrails?: A Little Help needed moving to and from a bed to a chair (including a wheelchair)?: A Little Help needed standing up from a chair using your arms (e.g., wheelchair or bedside chair)?: A Little Help needed to walk in hospital room?: A Little Help needed climbing 3-5 steps with a railing? : Total 6 Click Score: 16    End of Session Equipment Utilized During Treatment: Gait belt Activity Tolerance: Patient tolerated treatment well Patient left: in chair;with call bell/phone within reach;with chair alarm set   PT Visit Diagnosis: Other abnormalities of gait and mobility (R26.89);Other symptoms and signs involving the nervous system (R29.898)    Time: 1130-1156 PT Time Calculation (min) (ACUTE ONLY): 26 min   Charges:   PT Evaluation $PT Eval Moderate Complexity: [place], [place] Acute Rehabilitation Services [phone] 03/18/2019   Reginia Naas 03/18/2019, 12:40 PM

## 2019-03-18 NOTE — Progress Notes (Addendum)
Inpatient Rehabilitation-Admissions Coordinator   Met with pt for bedside assessment. Pt is from ALF per CSW. Noted recommendations are currently SNF. Feel this is appropriate. AC will sign off.   Please call if questions.   Jhonnie Garner, OTR/L  Rehab Admissions Coordinator  [phone] 03/18/2019 2:35 PM

## 2019-03-18 NOTE — Progress Notes (Signed)
PROGRESS NOTE    SELENI Diaz  VXB:[id number] DOB: Jun 19, 1932 DOA: 03/15/2019 PCP: Cassandria Anger, MD  Brief Narrative: 85 year old female with history of atrial fibrillation on Xarelto stroke, temporal arteritis, GERD, fibromyalgia, diastolic dysfunction, hypertension who presented to [facility] on 6/30 with altered mental status.  Head CT revealed thalamic intracerebral hemorrhage with minimal intraventricular extension.  She was given Kcentra and vitamin K.  She was transferred to [facility] for neurosurgery evaluation.  She remains confused but is protecting her airway currently.  CCM was consulted for ICU admission for close neurologic monitoring.  03/18/2019 I picked up this patient from [facility] 03/18/2019.  She was admitted 03/15/2019 with change in mental status thought to be secondary to thalamic intracerebral hemorrhage in the setting of Xarelto for atrial fibrillation.  She received vitamin K and Kcentra for anticoagulation reversal.  Patient was treated symptomatically and nonoperative management.  She has history of stroke atrial fibrillation and hypertension.  Assessment & Plan:   Active Problems:   ICH (intracerebral hemorrhage) (HCC)   1]Intracranial hemorrhage 2 to 3 cm left posterior thalamic bleed, patient on Xarelto for A. fib.  Patient was treated with Kcentra and vitamin K treated conservatively followed by neurosurgery and PCCM.  Repeat CT scan 03/17/2019 shows stable bleed.  She was transferred out of the ICU.  She has been seen by speech therapy.  I will get PT and OT to see her today.  Patient followed by neurology and neurosurgery.  #2 chronic atrial fibrillation continue rate control hold further anticoagulation continue diltiazem and metoprolol.replete k and mag.  #3 htn continue above meds  #4 hyperlipidemia intolerant to statins LDL 123   #5 DEMENTIA ON NAMENDA   #6 hypothyroidsm on synthroid   #7  Hypokalemia/ hypomagnesemia repleted patient  was started on Lasix yesterday I will start her on K. Dur replacement.  dvt prophylaxis scd Code status full Family communication will dw family  Disposition pending PT OT eval  Estimated body mass index is 25.93 kg/m as calculated from the following:   Height as of this encounter: 5\' 2"  (1.575 m).   Weight as of this encounter: 64.3 kg.    Subjective: Patient resting in bed dysarthric follow simple commands moving all extremities   Objective: Vitals:   03/18/19 0058 03/18/19 0353 03/18/19 0500 03/18/19 0816  BP: 107/62 118/84  (!) 128/97  Pulse: (!) 116 (!) 126  90  Resp: 18 16  18   Temp: 98 F (36.7 C) (!) 97.5 F (36.4 C)  97.7 F (36.5 C)  TempSrc: Oral Oral  Oral  SpO2: 97% 97%  100%  Weight:   64.3 kg   Height:        Intake/Output Summary (Last 24 hours) at 03/18/2019 0941 Last data filed at 03/18/2019 0817 Gross per 24 hour  Intake 1474.41 ml  Output 700 ml  Net 774.41 ml   Filed Weights   03/16/19 2100 03/17/19 0500 03/18/19 0500  Weight: 64.8 kg 63.1 kg 64.3 kg    Examination:  General exam: Appears calm and comfortable  Respiratory system: Clear to auscultation. Respiratory effort normal. Cardiovascular system: S1 & S2 heard, RRR. No JVD, murmurs, rubs, gallops or clicks. No pedal edema. Gastrointestinal system: Abdomen is nondistended, soft and nontender. No organomegaly or masses felt. Normal bowel sounds heard. Central nervous system: awake dysarthric moves all extremities Extremities: trace edema  Skin: No rashes, lesions or ulcers    Data Reviewed: I have personally reviewed following labs  and imaging studies  CBC: Recent Labs  Lab 03/16/19 0528 03/17/19 0429 03/18/19 0408  WBC 10.2 8.5 8.7  HGB 11.5* 11.1* 10.9*  HCT 35.0* 34.5* 33.7*  MCV 87.3 88.7 88.2  PLT 213 219 734   Basic Metabolic Panel: Recent Labs  Lab 03/16/19 0528 03/17/19 0429 03/18/19 0408  NA 133* 133* 131*  K 4.0 4.0 3.1*  CL 98 99 97*  CO2 22 23 25   GLUCOSE  98 121* 96  BUN 12 16 13   CREATININE 0.95 1.06* 0.91  CALCIUM 9.0 8.9 8.7*  MG 2.0  --  1.9  PHOS 4.7*  --   --    GFR: Estimated Creatinine Clearance: 40.5 mL/min (by C-G formula based on SCr of 0.91 mg/dL). Liver Function Tests: No results for input(s): AST, ALT, ALKPHOS, BILITOT, PROT, ALBUMIN in the last 168 hours. No results for input(s): LIPASE, AMYLASE in the last 168 hours. No results for input(s): AMMONIA in the last 168 hours. Coagulation Profile: No results for input(s): INR, PROTIME in the last 168 hours. Cardiac Enzymes: No results for input(s): CKTOTAL, CKMB, CKMBINDEX, TROPONINI in the last 168 hours. BNP (last 3 results) No results for input(s): PROBNP in the last 8760 hours. HbA1C: Recent Labs    03/17/19 0429  HGBA1C 5.5   CBG: No results for input(s): GLUCAP in the last 168 hours. Lipid Profile: Recent Labs    03/17/19 0429  CHOL 182  HDL 44  LDLCALC 123*  TRIG 75  CHOLHDL 4.1   Thyroid Function Tests: No results for input(s): TSH, T4TOTAL, FREET4, T3FREE, THYROIDAB in the last 72 hours. Anemia Panel: No results for input(s): VITAMINB12, FOLATE, FERRITIN, TIBC, IRON, RETICCTPCT in the last 72 hours. Sepsis Labs: No results for input(s): PROCALCITON, LATICACIDVEN in the last 168 hours.  Recent Results (from the past 240 hour(s))  MRSA PCR Screening     Status: None   Collection Time: 03/15/19  2:35 PM   Specimen: Nasal Mucosa; Nasopharyngeal  Result Value Ref Range Status   MRSA by PCR NEGATIVE NEGATIVE Final    Comment:        The GeneXpert MRSA Assay (FDA approved for NASAL specimens only), is one component of a comprehensive MRSA colonization surveillance program. It is not intended to diagnose MRSA infection nor to guide or monitor treatment for MRSA infections. Performed at [facility], [place] [address] [place],  [address]          Radiology Studies: Ct Angio Head W Or Wo Contrast  Result Date:  03/17/2019 CLINICAL DATA:  85 y/o  F; intracranial hemorrhage for follow-up EXAM: CT ANGIOGRAPHY HEAD AND NECK TECHNIQUE: Multidetector CT imaging of the head and neck was performed using the standard protocol during bolus administration of intravenous contrast. Multiplanar CT image reconstructions and MIPs were obtained to evaluate the vascular anatomy. Carotid stenosis measurements (when applicable) are obtained utilizing NASCET criteria, using the distal internal carotid diameter as the denominator. CONTRAST:  59mL OMNIPAQUE IOHEXOL 350 MG/ML SOLN COMPARISON:  03/16/2019 CT head FINDINGS: CT HEAD FINDINGS Brain: Stable acute hemorrhage within left thalamus measuring 17 x 25 mm (AP by ML series 4, image 17). Stable small volume intraventricular hemorrhage in left lateral ventricle. No new acute intracranial hemorrhage, mass effect, extra-axial collection, or findings of acute stroke. Stable small chronic infarct within the left superior cerebellum. Stable nonspecific white matter hypodensities compatible chronic microvascular ischemic changes. Stable volume loss of the brain. Vascular: As below. Skull: Normal. Negative for fracture or  focal lesion. Sinuses: Small mucous retention cysts within the left maxillary sinus. Trace fluid level within the sphenoid sinus. Normal aeration of the additional included paranasal sinuses and the mastoid air cells. Orbits: Bilateral intra-ocular lens replacement. Review of the MIP images confirms the above findings CTA NECK FINDINGS Aortic arch: Standard branching. Imaged portion shows no evidence of aneurysm or dissection. No significant stenosis of the major arch vessel origins. Mild aortic calcific atherosclerosis. Right carotid system: No evidence of dissection, stenosis (50% or greater) or occlusion. Mild non stenotic calcific atherosclerosis of carotid bifurcation. Left carotid system: No evidence of dissection, stenosis (50% or greater) or occlusion. Mild non stenotic  calcific atherosclerosis of carotid bifurcation. Vertebral arteries: Left dominant. No evidence of dissection, stenosis (50% or greater) or occlusion. Skeleton: No acute fracture. C2-3 small central disc protrusion effacing the ventral thecal sac. Other neck: Negative. Upper chest: Right lung apex pleuroparenchymal scarring Review of the MIP images confirms the above findings CTA HEAD FINDINGS Anterior circulation: No significant stenosis, proximal occlusion, aneurysm, or vascular malformation. Posterior circulation: No significant stenosis, proximal occlusion, aneurysm, or vascular malformation. Venous sinuses: As permitted by contrast timing, patent. Anatomic variants: Bilateral fetal PCA. Review of the MIP images confirms the above findings IMPRESSION: 1. Stable left thalamus acute hemorrhage and left lateral ventricle intraventricular hemorrhage. No new acute intracranial abnormality on noncontrast CT of head. 2. Patent carotid and vertebral arteries. No dissection, aneurysm, or hemodynamically significant stenosis utilizing NASCET criteria. 3. Patent anterior and posterior intracranial circulation. No vascular malformation, large vessel occlusion, aneurysm, or significant stenosis. Electronically Signed   By: Kristine Garbe M.D.   On: 03/17/2019 01:25   Ct Angio Neck W Or Wo Contrast  Result Date: 03/17/2019 CLINICAL DATA:  85 y/o  F; intracranial hemorrhage for follow-up EXAM: CT ANGIOGRAPHY HEAD AND NECK TECHNIQUE: Multidetector CT imaging of the head and neck was performed using the standard protocol during bolus administration of intravenous contrast. Multiplanar CT image reconstructions and MIPs were obtained to evaluate the vascular anatomy. Carotid stenosis measurements (when applicable) are obtained utilizing NASCET criteria, using the distal internal carotid diameter as the denominator. CONTRAST:  42mL OMNIPAQUE IOHEXOL 350 MG/ML SOLN COMPARISON:  03/16/2019 CT head FINDINGS: CT HEAD  FINDINGS Brain: Stable acute hemorrhage within left thalamus measuring 17 x 25 mm (AP by ML series 4, image 17). Stable small volume intraventricular hemorrhage in left lateral ventricle. No new acute intracranial hemorrhage, mass effect, extra-axial collection, or findings of acute stroke. Stable small chronic infarct within the left superior cerebellum. Stable nonspecific white matter hypodensities compatible chronic microvascular ischemic changes. Stable volume loss of the brain. Vascular: As below. Skull: Normal. Negative for fracture or focal lesion. Sinuses: Small mucous retention cysts within the left maxillary sinus. Trace fluid level within the sphenoid sinus. Normal aeration of the additional included paranasal sinuses and the mastoid air cells. Orbits: Bilateral intra-ocular lens replacement. Review of the MIP images confirms the above findings CTA NECK FINDINGS Aortic arch: Standard branching. Imaged portion shows no evidence of aneurysm or dissection. No significant stenosis of the major arch vessel origins. Mild aortic calcific atherosclerosis. Right carotid system: No evidence of dissection, stenosis (50% or greater) or occlusion. Mild non stenotic calcific atherosclerosis of carotid bifurcation. Left carotid system: No evidence of dissection, stenosis (50% or greater) or occlusion. Mild non stenotic calcific atherosclerosis of carotid bifurcation. Vertebral arteries: Left dominant. No evidence of dissection, stenosis (50% or greater) or occlusion. Skeleton: No acute fracture. C2-3 small central disc protrusion effacing  the ventral thecal sac. Other neck: Negative. Upper chest: Right lung apex pleuroparenchymal scarring Review of the MIP images confirms the above findings CTA HEAD FINDINGS Anterior circulation: No significant stenosis, proximal occlusion, aneurysm, or vascular malformation. Posterior circulation: No significant stenosis, proximal occlusion, aneurysm, or vascular malformation. Venous  sinuses: As permitted by contrast timing, patent. Anatomic variants: Bilateral fetal PCA. Review of the MIP images confirms the above findings IMPRESSION: 1. Stable left thalamus acute hemorrhage and left lateral ventricle intraventricular hemorrhage. No new acute intracranial abnormality on noncontrast CT of head. 2. Patent carotid and vertebral arteries. No dissection, aneurysm, or hemodynamically significant stenosis utilizing NASCET criteria. 3. Patent anterior and posterior intracranial circulation. No vascular malformation, large vessel occlusion, aneurysm, or significant stenosis. Electronically Signed   By: Kristine Garbe M.D.   On: 03/17/2019 01:25        Scheduled Meds:  azaTHIOprine  50 mg Oral Daily   Chlorhexidine Gluconate Cloth  6 each Topical Daily   cholecalciferol  1,000 Units Oral Daily   diltiazem  120 mg Oral Daily   folic acid  1 mg Oral Daily   furosemide  40 mg Oral Daily   levothyroxine  75 mcg Oral QAC breakfast   metoprolol tartrate  50 mg Oral BID   pantoprazole  40 mg Oral Daily   Continuous Infusions:  sodium chloride 50 mL/hr at 03/17/19 1602     LOS: 3 days     Georgette Shell, MD [facility] If 7PM-7AM, please contact night-coverage [email or web address] Password [email or web address] 03/18/2019, 9:41 AM

## 2019-03-18 NOTE — TOC Initial Note (Signed)
Transition of Care [facility]) - Initial/Assessment Note    Patient Details  Name: Michele Diaz MRN: [id number] Date of Birth: 12-18-1931  [facility] CM/SW Contact:    Gelene Mink, [place] Phone Number: 03/18/2019, 5:00 PM  Clinical Narrative:         CSW called and spoke with the patient's son, Bernese Doffing. He stated that his mother was a resident of [place], [place]. CSW asked if he would be agreeable to sending his mother to SNF. Jeneen Rinks stated he would prefer for his mother to receive physical therapy and occupational therapy at her assisted living. He stated that they would even pay for a private nurse aid. CSW asked if they were not able to set up home health, would he be agreeable to sending her to [facility], he was agreeable but would prefer her ALF.   CSW called and left a message with the admissions director at [facility] to see if the patient could receive home health services in her room. CSW is awaiting a return phone call.   CSW will continue to assist and follow.             Expected Discharge Plan: [facility](Or SNF if not able to get home health at ALF) Barriers to Discharge: Continued Medical Work up   Patient Goals and CMS Choice Patient states their goals for this hospitalization and ongoing recovery are:: Pt son would like his mother to get therapy at her ALF ([facility]) if not, agreeable to SNF at [facility].gov Compare Post Acute Care list provided to:: Patient Represenative (must comment) Choice offered to / list presented to : Adult Children  Expected Discharge Plan and Services Expected Discharge Plan: [facility](Or SNF if not able to get home health at ALF) In-house Referral: Clinical Social Work Discharge Planning Services: NA Post Acute Care Choice: Home Health                                        Prior Living Arrangements/Services   Lives  with:: Facility Resident Patient language and need for interpreter reviewed:: No Do you feel safe going back to the place where you live?: Yes      Need for Family Participation in Patient Care: Yes (Comment) Care giver support system in place?: Yes (comment)   Criminal Activity/Legal Involvement Pertinent to Current Situation/Hospitalization: No - Comment as needed  Activities of Daily Living      Permission Sought/Granted Permission sought to share information with : Case Manager Permission granted to share information with : Yes, Verbal Permission Granted  Share Information with NAME: Danitra Payano  Permission granted to share info w AGENCY: [place] granted to share info w Relationship: Son     Emotional Assessment Appearance:: Appears stated age Attitude/Demeanor/Rapport: Unable to Assess Affect (typically observed): Unable to Assess Orientation: : Oriented to Self, Oriented to Place Alcohol / Substance Use: Not Applicable Psych Involvement: No (comment)  Admission diagnosis:  subdural hemorrhage atrial fib Patient Active Problem List   Diagnosis Date Noted  . ICH (intracerebral hemorrhage) ([place]) 03/15/2019  . Acute metabolic encephalopathy 27/78/2423  . Acute on chronic diastolic heart failure ([place]) 11/29/2016  . CHF (congestive heart failure) ([place]) 11/28/2016  . Confusion 11/28/2016  . Acute on chronic diastolic CHF (congestive heart failure) ([place])  11/28/2016  . Autoimmune hepatitis ([place]) 11/28/2016  . Contusion of multiple sites of right shoulder 08/27/2016  . Pleural effusion 02/13/2016  . Positive D dimer 02/13/2016  . Anemia 02/13/2016  . H/O: CVA (cerebrovascular accident) 02/13/2016  . Prolonged Q-T interval on ECG 02/03/2016  . Atrial fibrillation with rapid ventricular response ([place]) 02/03/2016  . Cataract 03/20/2014  . Epistaxis 08/22/2013  . Wart viral 04/24/2013  . Actinic keratoses 04/22/2013  . Stroke ([place])  01/07/2013  . Well adult exam 06/01/2012  . Headache(784.0) 03/02/2012  . Grief reaction 09/19/2011  . Memory loss 05/26/2011  . Chest pain 04/24/2011  . Chronic hyponatremia 07/16/2010  . DEPRESSION/ANXIETY 04/03/2010  . Fatigue 03/06/2010  . WEIGHT LOSS 03/06/2010  . VERTIGO 10/23/2009  . LIVER FUNCTION TESTS, ABNORMAL, HX OF 08/17/2009  . CHEST WALL PAIN 04/06/2008  . GERD 10/15/2007  . Hypothyroidism, acquired 07/19/2007  . Essential hypertension 07/19/2007  . ALLERGIC RHINITIS 07/19/2007  . DIVERTICULOSIS, COLON 07/19/2007  . OSTEOARTHRITIS 07/19/2007  . BREAST CANCER, HX OF 07/19/2007  . VITAMIN D DEFICIENCY 06/09/2007  . Polymyalgia rheumatica ([place]) 06/09/2007   PCP:  Cassandria Anger, MD Pharmacy:  No Pharmacies Listed    Social Determinants of Health (SDOH) Interventions    Readmission Risk Interventions No flowsheet data found.

## 2019-03-18 NOTE — Evaluation (Signed)
Occupational Therapy Evaluation Patient Details Name: Michele Diaz MRN: [id number] DOB: 08/19/1932 Today's Date: 03/18/2019    History of Present Illness 85 year old female with history of atrial fibrillation on Xarelto stroke, temporal arteritis, GERD, fibromyalgia, diastolic dysfunction, hypertension who presented to [facility] regional hospital on 6/30 with altered mental status.  Head CT revealed thalamic intracerebral hemorrhage with minimal intraventricular extension.   Clinical Impression   Pt PTA: living at ALF/nursing home. Pt currently with R sided visual deficits and almost ran into counter on R side with RW. Pt limited by weakness mostly on R side and slow to initiate movement, aphasia to express needs and poor ability to care for self without max cueing. Pt currently performing ADL functional transfers with minA to minguardA and mobility with Rw and minA overall. Pt leaning to R seated EOB, but able to correct with verbal cues. Pt with R visual field deficits to continue to address. Pt's scanning and smooth pursuit appears intact. Pt min to modA for ADL tasks for safety and for sequencing. Pt incontinent this session. Pt would greatly benefit from continued OT skilled services for ADL, mobility and vision deficits. Rec'd: SNF. OT following acutely.      Follow Up Recommendations  SNF;Supervision/Assistance - 24 hour    Equipment Recommendations  None recommended by OT    Recommendations for Other Services       Precautions / Restrictions Precautions Precautions: Fall Restrictions Weight Bearing Restrictions: No      Mobility Bed Mobility Overal bed mobility: Needs Assistance Bed Mobility: Supine to Sit     Supine to sit: Min assist     General bed mobility comments: Cues to initiate task  Transfers Overall transfer level: Needs assistance Equipment used: Rolling walker (2 wheeled) Transfers: Sit to/from Stand Sit to Stand: Min assist;Min guard          General transfer comment: assist for balance from EOB, increased help from toilet in bathroom    Balance Overall balance assessment: Needs assistance Sitting-balance support: Feet unsupported Sitting balance-Leahy Scale: Fair Sitting balance - Comments: lateral lean to R, would self correct if verbal cue performed   Standing balance support: No upper extremity supported Standing balance-Leahy Scale: Fair Standing balance comment: standing to wash hands at sink unsupported, but imbalance with dynamic tasks                           ADL either performed or assessed with clinical judgement   ADL Overall ADL's : Needs assistance/impaired Eating/Feeding: Set up;Sitting Eating/Feeding Details (indicate cue type and reason): set-upA for cutting meat- pt unable to perform task without increased difficulty Grooming: Minimal assistance;Standing Grooming Details (indicate cue type and reason): placing toothpaste on brush and directions for sequencing when washing hands. Upper Body Bathing: Minimal assistance   Lower Body Bathing: Moderate assistance;Sitting/lateral leans;Sit to/from stand   Upper Body Dressing : Minimal assistance;Sitting   Lower Body Dressing: Moderate assistance;Sitting/lateral leans;Sit to/from stand   Toilet Transfer: Min guard;Ambulation;Regular Toilet;Grab bars   Toileting- Clothing Manipulation and Hygiene: Moderate assistance;Sitting/lateral lean;Sit to/from stand       Functional mobility during ADLs: Minimal assistance;Rolling walker;Cueing for safety;Cueing for sequencing General ADL Comments: Pt MinA for UB ADL, ModA for LB ADL. Pt requires constant cues to attend to task and sequence.     Vision Baseline Vision/History: No visual deficits Vision Assessment?: Vision impaired- to be further tested in functional context Additional Comments: WFLs fo scanning and smooth  tracking     Perception     Praxis      Pertinent Vitals/Pain Pain  Assessment: No/denies pain     Hand Dominance     Extremity/Trunk Assessment Upper Extremity Assessment Upper Extremity Assessment: Generalized weakness;RUE deficits/detail RUE Deficits / Details: R side/RUE inattention RUE Coordination: decreased fine motor;decreased gross motor   Lower Extremity Assessment Lower Extremity Assessment: Defer to PT evaluation;Generalized weakness   Cervical / Trunk Assessment Cervical / Trunk Assessment: Normal   Communication Communication Communication: Expressive difficulties   Cognition Arousal/Alertness: Awake/alert Behavior During Therapy: Flat affect Overall Cognitive Status: Difficult to assess                                     General Comments  incontinent of urine. Pt making a mess on floor when ambulating from bed to sink and requiring to go into bathroom immediately. pt stating " I normally know when to go."    Exercises     Shoulder Instructions      Home Living Family/patient expects to be discharged to:: Assisted living                                 Additional Comments: unable to obtain due to pt's aphasia      Prior Functioning/Environment Level of Independence: Needs assistance        Comments: unable to obtain due to aphasia        OT Problem List: Decreased strength;Decreased activity tolerance;Impaired balance (sitting and/or standing);Impaired vision/perception;Decreased coordination;Decreased safety awareness;Impaired UE functional use;Increased edema      OT Treatment/Interventions: Self-care/ADL training;Therapeutic exercise;Neuromuscular education;Energy conservation;DME and/or AE instruction;Therapeutic activities;Visual/perceptual remediation/compensation;Balance training;Cognitive remediation/compensation;Patient/family education    OT Goals(Current goals can be found in the care plan section) Acute Rehab OT Goals Patient Stated Goal: None stated OT Goal Formulation:  With patient Time For Goal Achievement: 04/01/19 Potential to Achieve Goals: Good ADL Goals Pt Will Perform Grooming: with modified independence;standing Pt Will Perform Lower Body Dressing: with modified independence;sit to/from stand Pt Will Perform Toileting - Clothing Manipulation and hygiene: with modified independence;sitting/lateral leans;sit to/from stand Additional ADL Goal #1: Pt will attend to RUE/R side with minimal verbal cues for OOB ADL tasks  OT Frequency: Min 2X/week   Barriers to D/C:            Co-evaluation PT/OT/SLP Co-Evaluation/Treatment: Yes Reason for Co-Treatment: Complexity of the patient's impairments (multi-system involvement);To address functional/ADL transfers PT goals addressed during session: Mobility/safety with mobility;Balance OT goals addressed during session: ADL's and self-care      AM-PAC OT "6 Clicks" Daily Activity     Outcome Measure Help from another person eating meals?: A Little Help from another person taking care of personal grooming?: A Little Help from another person toileting, which includes using toliet, bedpan, or urinal?: A Little Help from another person bathing (including washing, rinsing, drying)?: A Lot Help from another person to put on and taking off regular upper body clothing?: A Little Help from another person to put on and taking off regular lower body clothing?: A Lot 6 Click Score: 16   End of Session Equipment Utilized During Treatment: Gait belt;Rolling walker Nurse Communication: Mobility status  Activity Tolerance: Patient tolerated treatment well Patient left: in chair;with call bell/phone within reach;with chair alarm set  OT Visit Diagnosis: Unsteadiness on feet (R26.81);Muscle weakness (generalized) (  M62.81)                Time: 4462-8638 OT Time Calculation (min): 46 min Charges:  OT General Charges $OT Visit: 1 Visit OT Evaluation $OT Eval Moderate Complexity: 1 Mod OT Treatments $Self Care/Home  Management : 8-22 mins  Darryl Nestle) Marsa Aris OTR/L Acute Rehabilitation Services Pager: [phone] Office: [place] 03/18/2019, 1:35 PM

## 2019-03-18 NOTE — Care Management Important Message (Signed)
Important Message  Patient Details  Name: Michele Diaz MRN: [id number] Date of Birth: 30-Sep-1931   Medicare Important Message Given:  Yes     Orbie Pyo 03/18/2019, 2:37 PM

## 2019-03-18 NOTE — NC FL2 (Signed)
[place] MEDICAID FL2 LEVEL OF CARE SCREENING TOOL     IDENTIFICATION  Patient Name: Michele Diaz Birthdate: 09-Oct-1931 Sex: female Admission Date (Current Location): 03/15/2019  [facility] and [place] Number:  Herbalist and Address:  [facility], [place] [address], [place], [place] [address]      Provider Number: [id number]  Attending Physician Name and Address:  Georgette Shell, MD  Relative Name and Phone Number:  Aneta, [place], [phone]    Current Level of Care: Hospital Recommended Level of Care: [place] Prior Approval Number:    Date Approved/Denied:   PASRR Number: [id number]  Discharge Plan:      Current Diagnoses: Patient Active Problem List   Diagnosis Date Noted  . ICH (intracerebral hemorrhage) ([place]) 03/15/2019  . Acute metabolic encephalopathy 26/71/2458  . Acute on chronic diastolic heart failure ([place]) 11/29/2016  . CHF (congestive heart failure) ([place]) 11/28/2016  . Confusion 11/28/2016  . Acute on chronic diastolic CHF (congestive heart failure) ([place]) 11/28/2016  . Autoimmune hepatitis ([place]) 11/28/2016  . Contusion of multiple sites of right shoulder 08/27/2016  . Pleural effusion 02/13/2016  . Positive D dimer 02/13/2016  . Anemia 02/13/2016  . H/O: CVA (cerebrovascular accident) 02/13/2016  . Prolonged Q-T interval on ECG 02/03/2016  . Atrial fibrillation with rapid ventricular response ([place]) 02/03/2016  . Cataract 03/20/2014  . Epistaxis 08/22/2013  . Wart viral 04/24/2013  . Actinic keratoses 04/22/2013  . Stroke ([place]) 01/07/2013  . Well adult exam 06/01/2012  . Headache(784.0) 03/02/2012  . Grief reaction 09/19/2011  . Memory loss 05/26/2011  . Chest pain 04/24/2011  . Chronic hyponatremia 07/16/2010  . DEPRESSION/ANXIETY 04/03/2010  . Fatigue 03/06/2010  . WEIGHT LOSS 03/06/2010  . VERTIGO 10/23/2009  . LIVER FUNCTION TESTS, ABNORMAL, HX OF 08/17/2009  . CHEST WALL PAIN  04/06/2008  . GERD 10/15/2007  . Hypothyroidism, acquired 07/19/2007  . Essential hypertension 07/19/2007  . ALLERGIC RHINITIS 07/19/2007  . DIVERTICULOSIS, COLON 07/19/2007  . OSTEOARTHRITIS 07/19/2007  . BREAST CANCER, HX OF 07/19/2007  . VITAMIN D DEFICIENCY 06/09/2007  . Polymyalgia rheumatica ([place]) 06/09/2007    Orientation RESPIRATION BLADDER Height & Weight     Place, Self  Normal Incontinent, External catheter Weight: 141 lb 12.1 oz (64.3 kg) Height:  5\' 2"  (157.5 cm)  BEHAVIORAL SYMPTOMS/MOOD NEUROLOGICAL BOWEL NUTRITION STATUS      Incontinent Diet(Regular Diet, thin liquids)  AMBULATORY STATUS COMMUNICATION OF NEEDS Skin   Limited Assist Verbally Normal                       Personal Care Assistance Level of Assistance  Bathing, Feeding, Dressing, Total care Bathing Assistance: Limited assistance Feeding assistance: Independent Dressing Assistance: Limited assistance Total Care Assistance: Limited assistance   Functional Limitations Info  Sight, Hearing, Speech Sight Info: Adequate Hearing Info: Adequate Speech Info: Adequate    SPECIAL CARE FACTORS FREQUENCY  PT (By licensed PT), OT (By licensed OT)     PT Frequency: 5x/wk OT Frequency: 5x/wk            Contractures Contractures Info: Not present    Additional Factors Info  Code Status, Allergies Code Status Info: Full Code Allergies Info: Atorvastatin, Celecoxib, Rosuvastatin, Codeine, Procaine, Penicillins           Current Medications (03/18/2019):  This is the current hospital active medication list Current Facility-Administered Medications  Medication Dose Route Frequency Provider Last Rate Last Dose  .  0.9 %  sodium chloride infusion   Intravenous Continuous Rosalin Hawking, MD 50 mL/hr at 03/18/19 1238    . albuterol (PROVENTIL) (2.5 MG/3ML) 0.083% nebulizer solution 3 mL  3 mL Inhalation Q4H PRN Omar Person, NP      . azaTHIOprine (IMURAN) tablet 50 mg  50 mg Oral Daily Rosalin Hawking, MD   50 mg at 03/18/19 1121  . Chlorhexidine Gluconate Cloth 2 % PADS 6 each  6 each Topical Daily [place], Adewale A, MD   6 each at 03/18/19 1123  . cholecalciferol (VITAMIN D3) tablet 1,000 Units  1,000 Units Oral Daily Rosalin Hawking, MD   1,000 Units at 03/18/19 1123  . diltiazem (CARDIZEM CD) 24 hr capsule 120 mg  120 mg Oral Daily Clydell Hakim, MD   120 mg at 03/18/19 1120  . folic acid (FOLVITE) tablet 1 mg  1 mg Oral Daily Rosalin Hawking, MD   1 mg at 03/18/19 1120  . furosemide (LASIX) tablet 40 mg  40 mg Oral Daily Rosalin Hawking, MD   40 mg at 03/18/19 1120  . hydrALAZINE (APRESOLINE) injection 10 mg  10 mg Intravenous Q6H PRN Alfonzo Feller, NP      . levothyroxine (SYNTHROID) tablet 75 mcg  75 mcg Oral QAC breakfast Rosalin Hawking, MD   75 mcg at 03/18/19 0610  . metoprolol tartrate (LOPRESSOR) injection 7.5 mg  7.5 mg Intravenous Q6H PRN Frederik Pear, MD   7.5 mg at 03/16/19 0143  . metoprolol tartrate (LOPRESSOR) tablet 50 mg  50 mg Oral BID Alfonzo Feller, NP   50 mg at 03/18/19 1120  . pantoprazole (PROTONIX) EC tablet 40 mg  40 mg Oral Daily Rosalin Hawking, MD   40 mg at 03/18/19 1120  . potassium chloride SA (K-DUR) CR tablet 20 mEq  20 mEq Oral Daily Georgette Shell, MD   20 mEq at 03/18/19 1236     Discharge Medications: Please see discharge summary for a list of discharge medications.  Relevant Imaging Results:  Relevant Lab Results:   Additional Information SSN: [id number]  Philippa Chester Antoinette Borgwardt, LCSWA

## 2019-03-19 LAB — CBC
HCT: 35.1 % — ABNORMAL LOW (ref 36.0–46.0)
Hemoglobin: 11.2 g/dL — ABNORMAL LOW (ref 12.0–15.0)
MCH: 28.4 pg (ref 26.0–34.0)
MCHC: 31.9 g/dL (ref 30.0–36.0)
MCV: 89.1 fL (ref 80.0–100.0)
Platelets: 201 10*3/uL (ref 150–400)
RBC: 3.94 MIL/uL (ref 3.87–5.11)
RDW: 14.6 % (ref 11.5–15.5)
WBC: 6.8 10*3/uL (ref 4.0–10.5)
nRBC: 0 % (ref 0.0–0.2)

## 2019-03-19 LAB — BASIC METABOLIC PANEL
Anion gap: 9 (ref 5–15)
BUN: 14 mg/dL (ref 8–23)
CO2: 23 mmol/L (ref 22–32)
Calcium: 8.6 mg/dL — ABNORMAL LOW (ref 8.9–10.3)
Chloride: 100 mmol/L (ref 98–111)
Creatinine, Ser: 0.92 mg/dL (ref 0.44–1.00)
GFR calc Af Amer: >60 mL/min (ref >60)
GFR calc non Af Amer: 57 mL/min — ABNORMAL LOW (ref >60)
Glucose, Bld: 88 mg/dL (ref 70–99)
Potassium: 3.6 mmol/L (ref 3.5–5.1)
Sodium: 132 mmol/L — ABNORMAL LOW (ref 135–145)

## 2019-03-19 NOTE — Progress Notes (Signed)
PROGRESS NOTE    KYLEA BERRONG  DJM:[id number] DOB: Mar 24, 1932 DOA: 03/15/2019 PCP: Cassandria Anger, MD   Brief Narrative:85 year old female with history of atrial fibrillation on Xarelto stroke, temporal arteritis, GERD, fibromyalgia, diastolic dysfunction, hypertension who presented to [facility] hospital on 6/30 with altered mental status. Head CT revealed thalamic intracerebral hemorrhage with minimal intraventricular extension. She was given Kcentra and vitamin K.She was transferred to [facility] for neurosurgery evaluation. She remains confused but is protecting her airway currently. CCM was consulted for ICU admission for close neurologic monitoring.  03/18/2019 I picked up this patient from [facility] 03/18/2019.  She was admitted 03/15/2019 with change in mental status thought to be secondary to thalamic intracerebral hemorrhage in the setting of Xarelto for atrial fibrillation.  She received vitamin K and Kcentra for anticoagulation reversal.  Patient was treated symptomatically and nonoperative management.  She has history of stroke atrial fibrillation and hypertension.  03/19/2019   Assessment & Plan:   Active Problems:   ICH (intracerebral hemorrhage) (HCC)  1]Intracranial hemorrhage 2 to 3 cm left posterior thalamic bleed, patient on Xarelto for A. fib.  Patient was treated with Kcentra and vitamin K treated conservatively followed by neurosurgery and PCCM.  Repeat CT scan 03/17/2019 shows stable bleed. PT recommends SNF.COVID TEST ORDERED TODAY.   #2 chronic atrial fibrillation continue rate control hold further anticoagulation continue diltiazem and metoprolol.replete k and mag.  #3 htn continue above meds  #4 hyperlipidemia intolerant to statins LDL 123   #5 DEMENTIA ON NAMENDA   #6 hypothyroidsm on synthroid   #7  Hypokalemia/ hypomagnesemia repleted patient was started on Lasix yesterday I will start her on K. Dur replacement.  dvt prophylaxis scd Code  status full Family communication will dw family  Disposition pending snf and covid   Estimated body mass index is 25.56 kg/m as calculated from the following:   Height as of this encounter: 5\' 2"  (1.575 m).   Weight as of this encounter: 63.4 kg.      Subjective: Resting in bed awake alert still some dysarthria present  Objective: Vitals:   03/18/19 2359 03/19/19 0415 03/19/19 0500 03/19/19 0805  BP: 131/76 117/60  126/90  Pulse: 76 89  (!) 110  Resp: 16 15  17   Temp: 98.1 F (36.7 C) 97.6 F (36.4 C)  97.8 F (36.6 C)  TempSrc: Oral Oral  Oral  SpO2: 95% 100%  99%  Weight:   63.4 kg   Height:        Intake/Output Summary (Last 24 hours) at 03/19/2019 1048 Last data filed at 03/19/2019 0839 Gross per 24 hour  Intake 1159.7 ml  Output 200 ml  Net 959.7 ml   Filed Weights   03/17/19 0500 03/18/19 0500 03/19/19 0500  Weight: 63.1 kg 64.3 kg 63.4 kg    Examination:  General exam: Appears calm and comfortable  Respiratory system: Clear to auscultation. Respiratory effort normal. Cardiovascular system: S1 & S2 heard, RRR. No JVD, murmurs, rubs, gallops or clicks. No pedal edema. Gastrointestinal system: Abdomen is nondistended, soft and nontender. No organomegaly or masses felt. Normal bowel sounds heard. Central nervous system: Alert and oriented. dysarthria Extremities: Symmetric 5 x 5 power. Skin: No rashes, lesions or ulcers Psychiatry: Judgement and insight appear normal. Mood & affect appropriate.     Data Reviewed: I have personally reviewed following labs and imaging studies  CBC: Recent Labs  Lab 03/16/19 0528 03/17/19 0429 03/18/19 0408 03/19/19 0416  WBC 10.2 8.5 8.7  6.8  HGB 11.5* 11.1* 10.9* 11.2*  HCT 35.0* 34.5* 33.7* 35.1*  MCV 87.3 88.7 88.2 89.1  PLT 213 219 220 875   Basic Metabolic Panel: Recent Labs  Lab 03/16/19 0528 03/17/19 0429 03/18/19 0408 03/19/19 0416  NA 133* 133* 131* 132*  K 4.0 4.0 3.1* 3.6  CL 98 99 97* 100   CO2 22 23 25 23   GLUCOSE 98 121* 96 88  BUN 12 16 13 14   CREATININE 0.95 1.06* 0.91 0.92  CALCIUM 9.0 8.9 8.7* 8.6*  MG 2.0  --  1.9  --   PHOS 4.7*  --   --   --    GFR: Estimated Creatinine Clearance: 39.8 mL/min (by C-G formula based on SCr of 0.92 mg/dL). Liver Function Tests: No results for input(s): AST, ALT, ALKPHOS, BILITOT, PROT, ALBUMIN in the last 168 hours. No results for input(s): LIPASE, AMYLASE in the last 168 hours. No results for input(s): AMMONIA in the last 168 hours. Coagulation Profile: No results for input(s): INR, PROTIME in the last 168 hours. Cardiac Enzymes: No results for input(s): CKTOTAL, CKMB, CKMBINDEX, TROPONINI in the last 168 hours. BNP (last 3 results) No results for input(s): PROBNP in the last 8760 hours. HbA1C: Recent Labs    03/17/19 0429  HGBA1C 5.5   CBG: No results for input(s): GLUCAP in the last 168 hours. Lipid Profile: Recent Labs    03/17/19 0429  CHOL 182  HDL 44  LDLCALC 123*  TRIG 75  CHOLHDL 4.1   Thyroid Function Tests: No results for input(s): TSH, T4TOTAL, FREET4, T3FREE, THYROIDAB in the last 72 hours. Anemia Panel: No results for input(s): VITAMINB12, FOLATE, FERRITIN, TIBC, IRON, RETICCTPCT in the last 72 hours. Sepsis Labs: No results for input(s): PROCALCITON, LATICACIDVEN in the last 168 hours.  Recent Results (from the past 240 hour(s))  MRSA PCR Screening     Status: None   Collection Time: 03/15/19  2:35 PM   Specimen: Nasal Mucosa; Nasopharyngeal  Result Value Ref Range Status   MRSA by PCR NEGATIVE NEGATIVE Final    Comment:        The GeneXpert MRSA Assay (FDA approved for NASAL specimens only), is one component of a comprehensive MRSA colonization surveillance program. It is not intended to diagnose MRSA infection nor to guide or monitor treatment for MRSA infections. Performed at [facility], [place] [address], [place], [place] [address]          Radiology Studies: No results  found.      Scheduled Meds: . azaTHIOprine  50 mg Oral Daily  . Chlorhexidine Gluconate Cloth  6 each Topical Daily  . cholecalciferol  1,000 Units Oral Daily  . diltiazem  120 mg Oral Daily  . folic acid  1 mg Oral Daily  . furosemide  40 mg Oral Daily  . levothyroxine  75 mcg Oral QAC breakfast  . metoprolol tartrate  50 mg Oral BID  . pantoprazole  40 mg Oral Daily  . potassium chloride  20 mEq Oral Daily   Continuous Infusions: . sodium chloride 50 mL/hr at 03/19/19 0839     LOS: 4 days     Georgette Shell, MD [facility]  If 7PM-7AM, please contact night-coverage [email or web address] Password [email or web address] 03/19/2019, 10:48 AM

## 2019-03-20 LAB — NOVEL CORONAVIRUS, NAA (HOSP ORDER, SEND-OUT TO REF LAB; TAT 18-24 HRS): SARS-CoV-2, NAA: NOT DETECTED

## 2019-03-20 MED ORDER — POTASSIUM CHLORIDE CRYS ER 20 MEQ PO TBCR
40.0000 meq | EXTENDED_RELEASE_TABLET | Freq: Once | ORAL | Status: AC
  Administered 2019-03-20: 40 meq via ORAL
  Filled 2019-03-20: qty 2

## 2019-03-20 MED ORDER — FUROSEMIDE 20 MG PO TABS
20.0000 mg | ORAL_TABLET | Freq: Every day | ORAL | Status: DC
  Administered 2019-03-21: 20 mg via ORAL
  Filled 2019-03-20: qty 1

## 2019-03-20 MED ORDER — MAGNESIUM SULFATE 4 GM/100ML IV SOLN
4.0000 g | Freq: Once | INTRAVENOUS | Status: AC
  Administered 2019-03-20: 4 g via INTRAVENOUS
  Filled 2019-03-20: qty 100

## 2019-03-20 NOTE — Progress Notes (Signed)
PROGRESS NOTE    Michele Diaz  NTI:[id number] DOB: [birth year] DOA: 03/15/2019 PCP: Cassandria Anger, MD    Brief Narrative: 85 year old female with history of atrial fibrillation on Xarelto stroke, temporal arteritis, GERD, fibromyalgia, diastolic dysfunction, hypertension who presented to [facility] on 6/30 with altered mental status. Head CT revealed thalamic intracerebral hemorrhage with minimal intraventricular extension. She was given Kcentra and vitamin K.She was transferred to [facility] for neurosurgery evaluation. She remains confused but is protecting her airway currently. CCM was consulted for ICU admission for close neurologic monitoring.  03/18/2019 I picked up this patient from [facility] 03/18/2019. She was admitted 03/15/2019 with change in mental status thought to be secondary to thalamic intracerebral hemorrhage in the setting of Xarelto for atrial fibrillation. She received vitamin K and Kcentra for anticoagulation reversal. Patient was treated symptomatically and nonoperative management. She has history of stroke atrial fibrillation and hypertension.   Assessment & Plan:   Active Problems:   ICH (intracerebral hemorrhage) (HCC)   1]Intracranial hemorrhage 2 to 3 cm left posterior thalamic bleed, patient on Xarelto for A. fib. Patient was treated with Kcentra and vitamin K treated conservatively followed by neurosurgery and PCCM. Repeat CT scan 03/17/2019 shows stable bleed. PT recommends SNF.COVID TEST pending  #2 chronic atrial fibrillation continue rate control with diltiazem and metoprolol.  Hold any anticoagulation due to recent intracranial hemorrhage.  Potassium 3.6 replete.    #3 htn continue above meds  #4 hyperlipidemia intolerant to statins LDL 123   #5 DEMENTIA ON NAMENDA   #6 hypothyroidsm on synthroid   #7Hypokalemia/hypomagnesemia repleted  dvt prophylaxis scd Code status full Family communication  dw  familydaughter in law Ivin Booty  [phone] Disposition pending snf and covid     Estimated body mass index is 25.56 kg/m as calculated from the following:   Height as of this encounter: 5\' 2"  (1.575 m).   Weight as of this encounter: 63.4 kg.    Subjective: She is trying to get out of bed to use the bedside commode still somewhat dysarthric but better  Objective: Vitals:   03/19/19 1948 03/19/19 2259 03/20/19 0513 03/20/19 0751  BP: (!) 121/92 (!) 135/95 (!) 134/103 132/85  Pulse: (!) 113 (!) 124 90 83  Resp: 18 16 16 16   Temp: (!) 97.4 F (36.3 C) 98 F (36.7 C)  98.2 F (36.8 C)  TempSrc: Oral Oral    SpO2: 94% 96% 98% 99%  Weight:      Height:        Intake/Output Summary (Last 24 hours) at 03/20/2019 1144 Last data filed at 03/19/2019 1434 Gross per 24 hour  Intake 334.15 ml  Output -  Net 334.15 ml   Filed Weights   03/17/19 0500 03/18/19 0500 03/19/19 0500  Weight: 63.1 kg 64.3 kg 63.4 kg    Examination:  General exam: Appears calm and comfortable  Respiratory system: Clear to auscultation. Respiratory effort normal. Cardiovascular system: S1 & S2 heard, RRR. No JVD, murmurs, rubs, gallops or clicks. No pedal edema. Gastrointestinal system: Abdomen is nondistended, soft and nontender. No organomegaly or masses felt. Normal bowel sounds heard. Central nervous system: Alert and oriented.dysarthria better Extremities: Symmetric 5 x 5 power. Skin: No rashes, lesions or ulcers Psychiatry: Judgement and insight appear normal. Mood & affect appropriate.     Data Reviewed: I have personally reviewed following labs and imaging studies  CBC: Recent Labs  Lab 03/16/19 0528 03/17/19 0429 03/18/19 0408 03/19/19 0416  WBC 10.2 8.5  8.7 6.8  HGB 11.5* 11.1* 10.9* 11.2*  HCT 35.0* 34.5* 33.7* 35.1*  MCV 87.3 88.7 88.2 89.1  PLT 213 219 220 182   Basic Metabolic Panel: Recent Labs  Lab 03/16/19 0528 03/17/19 0429 03/18/19 0408 03/19/19 0416  NA 133* 133*  131* 132*  K 4.0 4.0 3.1* 3.6  CL 98 99 97* 100  CO2 22 23 25 23   GLUCOSE 98 121* 96 88  BUN 12 16 13 14   CREATININE 0.95 1.06* 0.91 0.92  CALCIUM 9.0 8.9 8.7* 8.6*  MG 2.0  --  1.9  --   PHOS 4.7*  --   --   --    GFR: Estimated Creatinine Clearance: 39.8 mL/min (by C-G formula based on SCr of 0.92 mg/dL). Liver Function Tests: No results for input(s): AST, ALT, ALKPHOS, BILITOT, PROT, ALBUMIN in the last 168 hours. No results for input(s): LIPASE, AMYLASE in the last 168 hours. No results for input(s): AMMONIA in the last 168 hours. Coagulation Profile: No results for input(s): INR, PROTIME in the last 168 hours. Cardiac Enzymes: No results for input(s): CKTOTAL, CKMB, CKMBINDEX, TROPONINI in the last 168 hours. BNP (last 3 results) No results for input(s): PROBNP in the last 8760 hours. HbA1C: No results for input(s): HGBA1C in the last 72 hours. CBG: No results for input(s): GLUCAP in the last 168 hours. Lipid Profile: No results for input(s): CHOL, HDL, LDLCALC, TRIG, CHOLHDL, LDLDIRECT in the last 72 hours. Thyroid Function Tests: No results for input(s): TSH, T4TOTAL, FREET4, T3FREE, THYROIDAB in the last 72 hours. Anemia Panel: No results for input(s): VITAMINB12, FOLATE, FERRITIN, TIBC, IRON, RETICCTPCT in the last 72 hours. Sepsis Labs: No results for input(s): PROCALCITON, LATICACIDVEN in the last 168 hours.  Recent Results (from the past 240 hour(s))  MRSA PCR Screening     Status: None   Collection Time: 03/15/19  2:35 PM   Specimen: Nasal Mucosa; Nasopharyngeal  Result Value Ref Range Status   MRSA by PCR NEGATIVE NEGATIVE Final    Comment:        The GeneXpert MRSA Assay (FDA approved for NASAL specimens only), is one component of a comprehensive MRSA colonization surveillance program. It is not intended to diagnose MRSA infection nor to guide or monitor treatment for MRSA infections. Performed at [facility], [place] [address] [place],   [address]          Radiology Studies: No results found.      Scheduled Meds: . azaTHIOprine  50 mg Oral Daily  . Chlorhexidine Gluconate Cloth  6 each Topical Daily  . cholecalciferol  1,000 Units Oral Daily  . diltiazem  120 mg Oral Daily  . folic acid  1 mg Oral Daily  . furosemide  40 mg Oral Daily  . levothyroxine  75 mcg Oral QAC breakfast  . metoprolol tartrate  50 mg Oral BID  . pantoprazole  40 mg Oral Daily  . potassium chloride  20 mEq Oral Daily   Continuous Infusions: . sodium chloride 50 mL/hr at 03/19/19 1620     LOS: 5 days     Georgette Shell, MD [facility]  If 7PM-7AM, please contact night-coverage [email or web address] Password [email or web address] 03/20/2019, 11:44 AM

## 2019-03-21 LAB — TSH: TSH: 4.575 u[IU]/mL — ABNORMAL HIGH (ref 0.350–4.500)

## 2019-03-21 MED ORDER — FUROSEMIDE 20 MG PO TABS: 20.0000 mg | ORAL_TABLET | Freq: Every day | ORAL | 1 refills | Status: AC

## 2019-03-21 MED ORDER — METOPROLOL TARTRATE 50 MG PO TABS: 50.0000 mg | ORAL_TABLET | Freq: Two times a day (BID) | ORAL | 1 refills | Status: AC

## 2019-03-21 MED ORDER — POTASSIUM CHLORIDE ER 10 MEQ PO TBCR: 10.0000 meq | EXTENDED_RELEASE_TABLET | Freq: Every day | ORAL | 1 refills | Status: AC

## 2019-03-21 NOTE — Progress Notes (Signed)
Inpatient Rehabilitation-Admissions Coordinator   [facility] noted new CIR consult. Currently plan is for SNF. Will DC order.   Please call if questions.  Jhonnie Garner, OTR/L  Rehab Admissions Coordinator  [phone] 03/21/2019 9:13 AM

## 2019-03-21 NOTE — TOC Transition Note (Signed)
Transition of Care [facility]) - CM/SW Discharge Note   Patient Details  Name: Michele Diaz MRN: [id number] Date of Birth: November 04, 1931  [facility] CM/SW Contact:  Pollie Friar, RN Phone Number: 03/21/2019, 1:04 PM   Clinical Narrative:    Pt discharging to [facility] today. TOC has spoken to facility and patients son: Jeneen Rinks.  [facility] to provide transportation. Timing arranged per bedside RN request for 3 pm.   Report to be called to: [phone] Patient will be in room 403-B  Final next level of care: Skilled Nursing Facility Barriers to Discharge: No Barriers Identified   Patient Goals and CMS Choice Patient states their goals for this hospitalization and ongoing recovery are:: Pt son would like his mother to get therapy at her ALF ([facility]) if not, agreeable to SNF at [email or web address] Compare Post Acute Care list provided to:: Patient Represenative (must comment)(son) Choice offered to / list presented to : Adult Children  Discharge Placement              Patient chooses bed at: [facility] Patient to be transferred to facility by: [facility] Name of family member notified: Cherie Patient and family notified of of transfer: 03/21/19(son: Jeneen Rinks)  Discharge Plan and Services In-house Referral: Clinical Social Work Discharge Planning Services: NA Post Acute Care Choice: Home Health                               Social Determinants of Health (SDOH) Interventions     Readmission Risk Interventions No flowsheet data found.

## 2019-03-21 NOTE — Discharge Summary (Signed)
Physician Discharge Summary  JOLIANA CLAFLIN IRS:[id number] DOB: 1932/03/09 DOA: 03/15/2019  PCP: Cassandria Anger, MD  Admit date: 03/15/2019 Discharge date: 03/21/2019  Admitted From: Assisted living Disposition: Skilled nursing facility  recommendations for Outpatient Follow-up:  1. Follow up with PCP in 1-2 weeks 2. Please obtain BMP/CBC in one week 3. Please follow-up with [place] neurology Home Health: None Equipment/Devices: None  Discharge Condition: Stable and improved CODE STATUS full code Diet recommendation: Cardiac diet Brief/Interim Summary:85 year old female with history of atrial fibrillation on Xarelto stroke, temporal arteritis, GERD, fibromyalgia, diastolic dysfunction, hypertension who presented to [facility] on 6/30 with altered mental status. Head CT revealed thalamic intracerebral hemorrhage with minimal intraventricular extension. She was given Kcentra and vitamin K.She was transferred to [facility] for neurosurgery evaluation. She remains confused but is protecting her airway currently. CCM was consulted for ICU admission for close neurologic monitoring.  03/18/2019 I picked up this patient from [facility] 03/18/2019. She was admitted 03/15/2019 with change in mental status thought to be secondary to thalamic intracerebral hemorrhage in the setting of Xarelto for atrial fibrillation. She received vitamin K and Kcentra for anticoagulation reversal. Patient was treated symptomatically and nonoperative management. She has history of stroke atrial fibrillation and hypertension  Discharge Diagnoses:  Active Problems:   ICH (intracerebral hemorrhage) (HCC)   1]Intracranial hemorrhage 2 to 3 cm left posterior thalamic bleed, patient on Xarelto for A. fib. Patient was treated with Kcentra and vitamin K treated conservatively followed by neurosurgery neurology and PCCM. Repeat CT scan 03/17/2019 shows stable bleed. PT recommends SNF.COVID TEST negative  echocardiogram shows 55 to 60% A. fib with no source of embolus.  Her LDL was 123.  Hemoglobin A1c was 5.5.  Neurology recommended may restart Xarelto in 3 to 4 weeks once hematoma is resolved.  #2 chronic atrial fibrillation continue rate control hold further anticoagulation continue diltiazem and metoprolol.please check magnesium and potassium BMP once a week while at the nursing home.  Make sure her electrolytes  are within normal limits.  #3 htn continue metoprolol and diltiazem.  Metoprolol dose was increased to 50 mg twice a day.  #4 hyperlipidemia intolerant to statins LDL 123 she is intolerant to statins so she has not been started on any statins.  #5 DEMENTIA ON NAMENDA   #6 hypothyroidsm on synthroid   #7Hypokalemia/hypomagnesemia please recheck her labs CMP and CBC this week    Estimated body mass index is 25.48 kg/m as calculated from the following:   Height as of this encounter: 5\' 2"  (1.575 m).   Weight as of this encounter: 63.2 kg.  Discharge Instructions  Discharge Instructions    Ambulatory referral to Neurology   Complete by: As directed    Follow up with stroke clinic NP (Jessica Vanschaick or Cecille Rubin, if both not available, consider Zachery Dauer, or Ahern) at [facility] in about 4 weeks. Thanks.   Call MD for:  difficulty breathing, headache or visual disturbances   Complete by: As directed    Call MD for:  persistant nausea and vomiting   Complete by: As directed    Diet - low sodium heart healthy   Complete by: As directed    Increase activity slowly   Complete by: As directed      Allergies as of 03/21/2019      Reactions   Atorvastatin Other (See Comments)   Elevated liver enzymes   Celecoxib Shortness Of Breath, Itching, Rash   redness   Rosuvastatin Other (See  Comments)   Elevated liver enzymes   Codeine Nausea Only   Procaine Other (See Comments)   Unknown   Penicillins Hives      Medication List    STOP taking these  medications   albuterol 108 (90 Base) MCG/ACT inhaler Commonly known as: VENTOLIN HFA   Rivaroxaban 15 MG Tabs tablet Commonly known as: Xarelto     TAKE these medications   acetaminophen 325 MG tablet Commonly known as: TYLENOL Take 650 mg by mouth every 6 (six) hours as needed for mild pain.   azaTHIOprine 50 MG tablet Commonly known as: IMURAN TAKE 1 TABLET (50 MG TOTAL) BY MOUTH DAILY.   cholecalciferol 1000 units tablet Commonly known as: VITAMIN D Take 1 tablet (1,000 Units total) by mouth daily.   diltiazem 120 MG 24 hr capsule Commonly known as: CARDIZEM CD Take 1 capsule (120 mg total) by mouth daily.   escitalopram 10 MG tablet Commonly known as: LEXAPRO Take 1 tablet (10 mg total) by mouth daily.   feeding supplement (ENSURE ENLIVE) Liqd Take 237 mLs by mouth 2 (two) times daily between meals.   folic acid 1 MG tablet Commonly known as: FOLVITE Take 1 tablet (1 mg total) by mouth daily.   furosemide 20 MG tablet Commonly known as: LASIX Take 1 tablet (20 mg total) by mouth daily. Start taking on: March 22, 2019 What changed:   medication strength  how much to take   levothyroxine 75 MCG tablet Commonly known as: SYNTHROID Take 1 tablet (75 mcg total) by mouth daily before breakfast.   memantine 10 MG tablet Commonly known as: NAMENDA Take 1 tablet (10 mg total) by mouth daily.   metoprolol tartrate 50 MG tablet Commonly known as: LOPRESSOR Take 1 tablet (50 mg total) by mouth 2 (two) times daily. What changed: medication strength   pantoprazole 40 MG tablet Commonly known as: PROTONIX Take 1 tablet (40 mg total) by mouth daily.   potassium chloride 10 MEQ tablet Commonly known as: K-DUR Take 1 tablet (10 mEq total) by mouth daily. What changed: how much to take      Follow-up Information    [facility]. Schedule an appointment as soon as possible for a visit in 4 week(s).   Specialty: Neurology Contact  information: [address] [place] [phone]       [name], Evie Lacks, MD Follow up.   Specialty: Internal Medicine Contact information: [place] [phone]          Allergies  Allergen Reactions  . Atorvastatin Other (See Comments)    Elevated liver enzymes  . Celecoxib Shortness Of Breath, Itching and Rash    redness  . Rosuvastatin Other (See Comments)    Elevated liver enzymes  . Codeine Nausea Only  . Procaine Other (See Comments)    Unknown  . Penicillins Hives    Consultations:  pccm neuro neuro surgery   Procedures/Studies: Ct Angio Head W Or Wo Contrast  Result Date: 03/17/2019 CLINICAL DATA:  85 y/o  F; intracranial hemorrhage for follow-up EXAM: CT ANGIOGRAPHY HEAD AND NECK TECHNIQUE: Multidetector CT imaging of the head and neck was performed using the standard protocol during bolus administration of intravenous contrast. Multiplanar CT image reconstructions and MIPs were obtained to evaluate the vascular anatomy. Carotid stenosis measurements (when applicable) are obtained utilizing NASCET criteria, using the distal internal carotid diameter as the denominator. CONTRAST:  14mL OMNIPAQUE IOHEXOL 350 MG/ML SOLN  COMPARISON:  03/16/2019 CT head FINDINGS: CT HEAD FINDINGS Brain: Stable acute hemorrhage within left thalamus measuring 17 x 25 mm (AP by ML series 4, image 17). Stable small volume intraventricular hemorrhage in left lateral ventricle. No new acute intracranial hemorrhage, mass effect, extra-axial collection, or findings of acute stroke. Stable small chronic infarct within the left superior cerebellum. Stable nonspecific white matter hypodensities compatible chronic microvascular ischemic changes. Stable volume loss of the brain. Vascular: As below. Skull: Normal. Negative for fracture or focal lesion. Sinuses: Small mucous retention cysts within the left maxillary sinus. Trace fluid  level within the sphenoid sinus. Normal aeration of the additional included paranasal sinuses and the mastoid air cells. Orbits: Bilateral intra-ocular lens replacement. Review of the MIP images confirms the above findings CTA NECK FINDINGS Aortic arch: Standard branching. Imaged portion shows no evidence of aneurysm or dissection. No significant stenosis of the major arch vessel origins. Mild aortic calcific atherosclerosis. Right carotid system: No evidence of dissection, stenosis (50% or greater) or occlusion. Mild non stenotic calcific atherosclerosis of carotid bifurcation. Left carotid system: No evidence of dissection, stenosis (50% or greater) or occlusion. Mild non stenotic calcific atherosclerosis of carotid bifurcation. Vertebral arteries: Left dominant. No evidence of dissection, stenosis (50% or greater) or occlusion. Skeleton: No acute fracture. C2-3 small central disc protrusion effacing the ventral thecal sac. Other neck: Negative. Upper chest: Right lung apex pleuroparenchymal scarring Review of the MIP images confirms the above findings CTA HEAD FINDINGS Anterior circulation: No significant stenosis, proximal occlusion, aneurysm, or vascular malformation. Posterior circulation: No significant stenosis, proximal occlusion, aneurysm, or vascular malformation. Venous sinuses: As permitted by contrast timing, patent. Anatomic variants: Bilateral fetal PCA. Review of the MIP images confirms the above findings IMPRESSION: 1. Stable left thalamus acute hemorrhage and left lateral ventricle intraventricular hemorrhage. No new acute intracranial abnormality on noncontrast CT of head. 2. Patent carotid and vertebral arteries. No dissection, aneurysm, or hemodynamically significant stenosis utilizing NASCET criteria. 3. Patent anterior and posterior intracranial circulation. No vascular malformation, large vessel occlusion, aneurysm, or significant stenosis. Electronically Signed   By: Kristine Garbe M.D.   On: 03/17/2019 01:25   Ct Head Wo Contrast  Addendum Date: 03/16/2019   ADDENDUM REPORT: 03/16/2019 01:14 ADDENDUM: Recent comparison film done at [facility] on 03/15/2019 has become available. The left thalamic hemorrhage and intraventricular hemorrhage are stable. No new hemorrhage or significant change since prior study. Electronically Signed   By: Rolm Baptise M.D.   On: 03/16/2019 01:14   Result Date: 03/16/2019 CLINICAL DATA:  Intracerebral hemorrhage, follow-up EXAM: CT HEAD WITHOUT CONTRAST TECHNIQUE: Contiguous axial images were obtained from the base of the skull through the vertex without intravenous contrast. COMPARISON:  MRI 11/29/2016.  No recent studies. FINDINGS: Brain: There is a left thalamic acute hemorrhage measuring 2.5 x 1.7 x 2.7 cm. Intraventricular extension with blood noted in the left lateral ventricle. No hydrocephalus. Chronic small vessel disease throughout the deep white matter. Mild cerebral atrophy. No mass effect or midline shift. Vascular: No hyperdense vessel or unexpected calcification. Skull: No acute calvarial abnormality. Sinuses/Orbits: No acute finding Other: None IMPRESSION: Left thalamic intracerebral hemorrhage with intraventricular extension. No hydrocephalus. Atrophy, chronic small vessel disease. Electronically Signed: By: Rolm Baptise M.D. On: 03/16/2019 00:53   Ct Angio Neck W Or Wo Contrast  Result Date: 03/17/2019 CLINICAL DATA:  85 y/o  F; intracranial hemorrhage for follow-up EXAM: CT ANGIOGRAPHY HEAD AND NECK TECHNIQUE: Multidetector CT imaging of the head and  neck was performed using the standard protocol during bolus administration of intravenous contrast. Multiplanar CT image reconstructions and MIPs were obtained to evaluate the vascular anatomy. Carotid stenosis measurements (when applicable) are obtained utilizing NASCET criteria, using the distal internal carotid diameter as the denominator. CONTRAST:   68mL OMNIPAQUE IOHEXOL 350 MG/ML SOLN COMPARISON:  03/16/2019 CT head FINDINGS: CT HEAD FINDINGS Brain: Stable acute hemorrhage within left thalamus measuring 17 x 25 mm (AP by ML series 4, image 17). Stable small volume intraventricular hemorrhage in left lateral ventricle. No new acute intracranial hemorrhage, mass effect, extra-axial collection, or findings of acute stroke. Stable small chronic infarct within the left superior cerebellum. Stable nonspecific white matter hypodensities compatible chronic microvascular ischemic changes. Stable volume loss of the brain. Vascular: As below. Skull: Normal. Negative for fracture or focal lesion. Sinuses: Small mucous retention cysts within the left maxillary sinus. Trace fluid level within the sphenoid sinus. Normal aeration of the additional included paranasal sinuses and the mastoid air cells. Orbits: Bilateral intra-ocular lens replacement. Review of the MIP images confirms the above findings CTA NECK FINDINGS Aortic arch: Standard branching. Imaged portion shows no evidence of aneurysm or dissection. No significant stenosis of the major arch vessel origins. Mild aortic calcific atherosclerosis. Right carotid system: No evidence of dissection, stenosis (50% or greater) or occlusion. Mild non stenotic calcific atherosclerosis of carotid bifurcation. Left carotid system: No evidence of dissection, stenosis (50% or greater) or occlusion. Mild non stenotic calcific atherosclerosis of carotid bifurcation. Vertebral arteries: Left dominant. No evidence of dissection, stenosis (50% or greater) or occlusion. Skeleton: No acute fracture. C2-3 small central disc protrusion effacing the ventral thecal sac. Other neck: Negative. Upper chest: Right lung apex pleuroparenchymal scarring Review of the MIP images confirms the above findings CTA HEAD FINDINGS Anterior circulation: No significant stenosis, proximal occlusion, aneurysm, or vascular malformation. Posterior circulation:  No significant stenosis, proximal occlusion, aneurysm, or vascular malformation. Venous sinuses: As permitted by contrast timing, patent. Anatomic variants: Bilateral fetal PCA. Review of the MIP images confirms the above findings IMPRESSION: 1. Stable left thalamus acute hemorrhage and left lateral ventricle intraventricular hemorrhage. No new acute intracranial abnormality on noncontrast CT of head. 2. Patent carotid and vertebral arteries. No dissection, aneurysm, or hemodynamically significant stenosis utilizing NASCET criteria. 3. Patent anterior and posterior intracranial circulation. No vascular malformation, large vessel occlusion, aneurysm, or significant stenosis. Electronically Signed   By: Kristine Garbe M.D.   On: 03/17/2019 01:25   (Echo, Carotid, EGD, Colonoscopy, ERCP)    Subjective:  Resting in bed awake trying to get out of bed to sit in the bedside commode mild confusion noted moves all extremities denies any headache overnight nurse reported no acute issues Discharge Exam: Vitals:   03/21/19 0834 03/21/19 1159  BP: (!) 132/102 (!) 128/102  Pulse: (!) 108 100  Resp: 20 16  Temp: 98.3 F (36.8 C) 97.9 F (36.6 C)  SpO2: 98% 96%   Vitals:   03/21/19 0429 03/21/19 0500 03/21/19 0834 03/21/19 1159  BP: (!) 124/101  (!) 132/102 (!) 128/102  Pulse: 73  (!) 108 100  Resp: 18  20 16   Temp:   98.3 F (36.8 C) 97.9 F (36.6 C)  TempSrc:   Oral Oral  SpO2: 95%  98% 96%  Weight:  63.2 kg    Height:        General: Pt is alert, awake, not in acute distress Cardiovascular: RRR, S1/S2 +, no rubs, no gallops Respiratory: CTA bilaterally, no wheezing, no  rhonchi Abdominal: Soft, NT, ND, bowel sounds + Extremities: no edema, no cyanosis Neuro mild dysarthria noted moves all extremities and mild confusion noted    The results of significant diagnostics from this hospitalization (including imaging, microbiology, ancillary and laboratory) are listed below for  reference.     Microbiology: Recent Results (from the past 240 hour(s))  MRSA PCR Screening     Status: None   Collection Time: 03/15/19  2:35 PM   Specimen: Nasal Mucosa; Nasopharyngeal  Result Value Ref Range Status   MRSA by PCR NEGATIVE NEGATIVE Final    Comment:        The GeneXpert MRSA Assay (FDA approved for NASAL specimens only), is one component of a comprehensive MRSA colonization surveillance program. It is not intended to diagnose MRSA infection nor to guide or monitor treatment for MRSA infections. Performed at [facility], [place] [address], [place], [place] [address]   Novel Coronavirus, NAA (hospital order; send-out to ref lab)     Status: None   Collection Time: 03/19/19  6:51 AM   Specimen: Nasopharyngeal Swab; Respiratory  Result Value Ref Range Status   SARS-CoV-2, NAA NOT DETECTED NOT DETECTED Final    Comment: (NOTE) This test was developed and its performance characteristics determined by [employer]. This test has not been FDA cleared or approved. This test has been authorized by FDA under an Emergency Use Authorization (EUA). This test is only authorized for the duration of time the declaration that circumstances exist justifying the authorization of the emergency use of in vitro diagnostic tests for detection of SARS-CoV-2 virus and/or diagnosis of COVID-19 infection under section 564(b)(1) of the Act, 21 U.S.C. 259DGL-8(V)(5), unless the authorization is terminated or revoked sooner. When diagnostic testing is negative, the possibility of a false negative result should be considered in the context of a patient's recent exposures and the presence of clinical signs and symptoms consistent with COVID-19. An individual without symptoms of COVID-19 and who is not shedding SARS-CoV-2 virus would expect to have a negative (not detected) result in this assay. Performed  At: [facility] [address] [place], [place]  [id number] Rush Farmer MD AC:[id number]    [place]  Final    Comment: Performed at [facility], [place] [address], [place], [place] [address]     Labs: BNP (last 3 results) No results for input(s): BNP in the last 8760 hours. Basic Metabolic Panel: Recent Labs  Lab 03/16/19 0528 03/17/19 0429 03/18/19 0408 03/19/19 0416  NA 133* 133* 131* 132*  K 4.0 4.0 3.1* 3.6  CL 98 99 97* 100  CO2 22 23 25 23   GLUCOSE 98 121* 96 88  BUN 12 16 13 14   CREATININE 0.95 1.06* 0.91 0.92  CALCIUM 9.0 8.9 8.7* 8.6*  MG 2.0  --  1.9  --   PHOS 4.7*  --   --   --    Liver Function Tests: No results for input(s): AST, ALT, ALKPHOS, BILITOT, PROT, ALBUMIN in the last 168 hours. No results for input(s): LIPASE, AMYLASE in the last 168 hours. No results for input(s): AMMONIA in the last 168 hours. CBC: Recent Labs  Lab 03/16/19 0528 03/17/19 0429 03/18/19 0408 03/19/19 0416  WBC 10.2 8.5 8.7 6.8  HGB 11.5* 11.1* 10.9* 11.2*  HCT 35.0* 34.5* 33.7* 35.1*  MCV 87.3 88.7 88.2 89.1  PLT 213 219 220 201   Cardiac Enzymes: No results for input(s): CKTOTAL, CKMB, CKMBINDEX, TROPONINI in the  last 168 hours. BNP: Invalid input(s): POCBNP CBG: No results for input(s): GLUCAP in the last 168 hours. D-Dimer No results for input(s): DDIMER in the last 72 hours. Hgb A1c No results for input(s): HGBA1C in the last 72 hours. Lipid Profile No results for input(s): CHOL, HDL, LDLCALC, TRIG, CHOLHDL, LDLDIRECT in the last 72 hours. Thyroid function studies Recent Labs    03/21/19 0508  TSH 4.575*   Anemia work up No results for input(s): VITAMINB12, FOLATE, FERRITIN, TIBC, IRON, RETICCTPCT in the last 72 hours. Urinalysis    Component Value Date/Time   COLORURINE YELLOW 02/06/2017 1228   APPEARANCEUR CLEAR 02/06/2017 1228   LABSPEC 1.015 02/06/2017 1228   PHURINE 6.5 02/06/2017 1228   GLUCOSEU NEGATIVE 02/06/2017 1228   HGBUR NEGATIVE 02/06/2017 1228    BILIRUBINUR NEGATIVE 02/06/2017 1228   KETONESUR NEGATIVE 02/06/2017 1228   PROTEINUR 100 (A) 11/28/2016 2246   UROBILINOGEN 2.0 (A) 02/06/2017 1228   NITRITE NEGATIVE 02/06/2017 1228   LEUKOCYTESUR NEGATIVE 02/06/2017 1228   Sepsis Labs Invalid input(s): PROCALCITONIN,  WBC,  LACTICIDVEN Microbiology Recent Results (from the past 240 hour(s))  MRSA PCR Screening     Status: None   Collection Time: 03/15/19  2:35 PM   Specimen: Nasal Mucosa; Nasopharyngeal  Result Value Ref Range Status   MRSA by PCR NEGATIVE NEGATIVE Final    Comment:        The GeneXpert MRSA Assay (FDA approved for NASAL specimens only), is one component of a comprehensive MRSA colonization surveillance program. It is not intended to diagnose MRSA infection nor to guide or monitor treatment for MRSA infections. Performed at [facility], [place] [address], [place], [place] [address]   Novel Coronavirus, NAA (hospital order; send-out to ref lab)     Status: None   Collection Time: 03/19/19  6:51 AM   Specimen: Nasopharyngeal Swab; Respiratory  Result Value Ref Range Status   SARS-CoV-2, NAA NOT DETECTED NOT DETECTED Final    Comment: (NOTE) This test was developed and its performance characteristics determined by [employer]. This test has not been FDA cleared or approved. This test has been authorized by FDA under an Emergency Use Authorization (EUA). This test is only authorized for the duration of time the declaration that circumstances exist justifying the authorization of the emergency use of in vitro diagnostic tests for detection of SARS-CoV-2 virus and/or diagnosis of COVID-19 infection under section 564(b)(1) of the Act, 21 U.S.C. 165VVZ-4(M)(2), unless the authorization is terminated or revoked sooner. When diagnostic testing is negative, the possibility of a false negative result should be considered in the context of a patient's recent exposures and the presence of clinical signs  and symptoms consistent with COVID-19. An individual without symptoms of COVID-19 and who is not shedding SARS-CoV-2 virus would expect to have a negative (not detected) result in this assay. Performed  At: [facility] [address] [place], [place] [id number] Rush Farmer MD BE:[id number]    [place]  Final    Comment: Performed at [facility], [place] [address] [place],  [address]     Time coordinating discharge: 34 minutes  SIGNED:   Georgette Shell, MD  [facility] 03/21/2019, 12:05 PM Pager   If 7PM-7AM, please contact night-coverage [email or web address] Password [email or web address]

## 2019-03-21 NOTE — Care Management Important Message (Signed)
Important Message  Patient Details  Name: Michele Diaz MRN: [id number] Date of Birth: 1931-10-12   Medicare Important Message Given:  Yes     Ieasha Boerema 03/21/2019, 1:31 PM

## 2019-03-21 NOTE — Progress Notes (Signed)
Patient dressed, all belongings packed, lines removed. PTAR given report and patient taken away to [facility] by stretcher for discharge. Report called to Carmelina Peal, Therapist, sports at [facility].

## 2019-03-22 ENCOUNTER — Telehealth: Payer: Self-pay | Admitting: *Deleted

## 2019-03-22 NOTE — Telephone Encounter (Signed)
Pt was on TCM report admitted 03/15/19  with change in mental status thought to be secondary to thalamic intracerebral hemorrhage in the setting of Xarelto for atrial fibrillation. She received vitamin K and Kcentra for anticoagulation reversal. Patient was treated symptomatically and nonoperative management. Pt D/C 03/21/19 to SNF. Per summary pt is to follow=up w/PCP after SNF, per chart pt lives at assisting living facility. She has not seen MD since 2018. She see;'s in house provider at facility.Marland KitchenJohny Chess

## 2019-04-18 ENCOUNTER — Other Ambulatory Visit: Payer: Self-pay

## 2019-04-18 ENCOUNTER — Telehealth: Payer: Self-pay

## 2019-04-18 ENCOUNTER — Inpatient Hospital Stay: Payer: Medicare Other | Admitting: Adult Health

## 2019-04-18 NOTE — Progress Notes (Deleted)
[facility] [address] [place]. [place] [address] [phone]       HOSPITAL FOLLOW UP NOTE  [name] Date of Birth:  Jul 04, 1932 Medical Record Number:  [id number]   Reason for Referral:  hospital stroke follow up    CHIEF COMPLAINT:  No chief complaint on file.   HPI: Michele Diaz being seen today for in office hospital follow-up regarding ***.  History obtained from *** and chart review. Reviewed all radiology images and labs personally.   ROS:   14 system review of systems performed and negative with exception of ***  PMH:  Past Medical History:  Diagnosis Date  . Allergic rhinitis   . Anxiety   . Arthritis    "hips, hands, feet; pretty much feels like all over" (01/07/2013)  . Asthma   . Breast cancer ([place]) ~ 2008   "right" (01/07/2013)  . Chronic lower back pain   . Daily headache    "related to temporal arteritis" (01/07/2013)  . Depression   . Diastolic dysfunction   . Diverticulitis of colon   . Fibromyalgia   . GERD (gastroesophageal reflux disease)   . Giant cell arteritis ([place]) 2003   devashwar  . Hepatitis, autoimmune ([place])   . Herpes zoster 2011  . Hyperlipidemia    statins increased LFTs  . Hypertension   . Hypothyroid   . Persistent atrial fibrillation    a. s/p TEE/DCCV in 01/2016  . Stroke ([place])   . Temporal arteritis ([place])   . Unspecified vitamin D deficiency   . Weight loss 6/11    PSH:  Past Surgical History:  Procedure Laterality Date  . ABDOMINAL HYSTERECTOMY  1988  . APPENDECTOMY    . BREAST BIOPSY Right   . BREAST LUMPECTOMY Right   . CARDIOVERSION N/A 02/06/2016   Procedure: CARDIOVERSION;  Surgeon: Skeet Latch, MD;  Location: [facility];  Service: Cardiovascular;  Laterality: N/A;  . CHOLECYSTECTOMY    . DILATION AND CURETTAGE OF UTERUS    . TEE WITHOUT CARDIOVERSION N/A 02/06/2016   Procedure: TRANSESOPHAGEAL ECHOCARDIOGRAM (TEE);  Surgeon: Skeet Latch, MD;  Location: [facility] ENDOSCOPY;   Service: Cardiovascular;  Laterality: N/A;  . TONSILLECTOMY      Social History:  Social History   Socioeconomic History  . Marital status: Widowed    Spouse name: Not on file  . Number of children: 2  . Years of education: Not on file  . Highest education level: Not on file  Occupational History  . Occupation: retired    Fish farm manager: RETIRED  Social Needs  . Financial resource strain: Not on file  . Food insecurity    Worry: Not on file    Inability: Not on file  . Transportation needs    Medical: Not on file    Non-medical: Not on file  Tobacco Use  . Smoking status: Never Smoker  . Smokeless tobacco: Never Used  Substance and Sexual Activity  . Alcohol use: No  . Drug use: No  . Sexual activity: Never  Lifestyle  . Physical activity    Days per week: Not on file    Minutes per session: Not on file  . Stress: Not on file  Relationships  . Social Herbalist on phone: Not on file    Gets together: Not on file    Attends religious service: Not on file    Active member of club or organization: Not on file    Attends meetings  of clubs or organizations: Not on file    Relationship status: Not on file  . Intimate partner violence    Fear of current or ex partner: Not on file    Emotionally abused: Not on file    Physically abused: Not on file    Forced sexual activity: Not on file  Other Topics Concern  . Not on file  Social History Narrative  . Not on file    Family History:  Family History  Problem Relation Age of Onset  . Diabetes Mother   . Heart attack Mother        died of MI at age 31  . Cancer Brother   . Heart disease Brother   . Cancer Sister   . Heart disease Sister        MI at age 9  . Alzheimer's disease Sister   . Hypertension Other     Medications:   Current Outpatient Medications on File Prior to Visit  Medication Sig Dispense Refill  . acetaminophen (TYLENOL) 325 MG tablet Take 650 mg by mouth every 6 (six) hours as needed  for mild pain.    Marland Kitchen azaTHIOprine (IMURAN) 50 MG tablet TAKE 1 TABLET (50 MG TOTAL) BY MOUTH DAILY. 90 tablet 2  . cholecalciferol (VITAMIN D) 1000 UNITS tablet Take 1 tablet (1,000 Units total) by mouth daily. 30 tablet 1  . diltiazem (CARDIZEM CD) 120 MG 24 hr capsule Take 1 capsule (120 mg total) by mouth daily. 90 capsule 1  . escitalopram (LEXAPRO) 10 MG tablet Take 1 tablet (10 mg total) by mouth daily. 90 tablet 1  . feeding supplement, ENSURE ENLIVE, (ENSURE ENLIVE) LIQD Take 237 mLs by mouth 2 (two) times daily between meals. 568 mL 12  . folic acid (FOLVITE) 1 MG tablet Take 1 tablet (1 mg total) by mouth daily. 90 tablet 1  . furosemide (LASIX) 20 MG tablet Take 1 tablet (20 mg total) by mouth daily. 30 tablet 1  . levothyroxine (SYNTHROID, LEVOTHROID) 75 MCG tablet Take 1 tablet (75 mcg total) by mouth daily before breakfast. 90 tablet 1  . memantine (NAMENDA) 10 MG tablet Take 1 tablet (10 mg total) by mouth daily. 90 tablet 1  . metoprolol tartrate (LOPRESSOR) 50 MG tablet Take 1 tablet (50 mg total) by mouth 2 (two) times daily. 60 tablet 1  . pantoprazole (PROTONIX) 40 MG tablet Take 1 tablet (40 mg total) by mouth daily. 90 tablet 1  . potassium chloride (K-DUR) 10 MEQ tablet Take 1 tablet (10 mEq total) by mouth daily. 30 tablet 1  . [DISCONTINUED] FLUoxetine (PROZAC) 20 MG tablet Take 1 tablet (20 mg total) by mouth daily. 90 tablet 3   No current facility-administered medications on file prior to visit.     Allergies:   Allergies  Allergen Reactions  . Atorvastatin Other (See Comments)    Elevated liver enzymes  . Celecoxib Shortness Of Breath, Itching and Rash    redness  . Rosuvastatin Other (See Comments)    Elevated liver enzymes  . Codeine Nausea Only  . Procaine Other (See Comments)    Unknown  . Penicillins Hives     Physical Exam  There were no vitals filed for this visit. There is no height or weight on file to calculate BMI. No exam data present   Depression screen [facility] 2/9 05/06/2017  Decreased Interest 0  Down, Depressed, Hopeless 0  PHQ - 2 Score 0     General: well developed,  well nourished, seated, in no evident distress Head: head normocephalic and atraumatic.   Neck: supple with no carotid or supraclavicular bruits Cardiovascular: regular rate and rhythm, no murmurs Musculoskeletal: no deformity Skin:  no rash/petichiae Vascular:  Normal pulses all extremities   Neurologic Exam Mental Status: Awake and fully alert. Oriented to place and time. Recent and remote memory intact. Attention span, concentration and fund of knowledge appropriate. Mood and affect appropriate.  Cranial Nerves: Fundoscopic exam reveals sharp disc margins. Pupils equal, briskly reactive to light. Extraocular movements full without nystagmus. Visual fields full to confrontation. Hearing intact. Facial sensation intact. Face, tongue, palate moves normally and symmetrically.  Motor: Normal bulk and tone. Normal strength in all tested extremity muscles. Sensory.: intact to touch , pinprick , position and vibratory sensation.  Coordination: Rapid alternating movements normal in all extremities. Finger-to-nose and heel-to-shin performed accurately bilaterally. Gait and Station: Arises from chair without difficulty. Stance is normal. Gait demonstrates normal stride length and balance Reflexes: 1+ and symmetric. Toes downgoing.     NIHSS  *** Modified Rankin  *** CHA2DS2-VASc *** HAS-BLED ***   Diagnostic Data (Labs, Imaging, Testing)  CT HEAD WO CONTRAST ***  CT ANGIO HEAD W OR WO CONTRAST CT ANGIO NECK W OR WO CONTRAST ***  MR BRAIN WO CONTRAST ***  MR MRA HEAD  MR MRA NECK ***  ECHOCARDIOGRAM ***    ASSESSMENT: Michele Diaz is a 85 y.o. year old female here with *** on *** secondary to ***. Vascular risk factors include ***.     PLAN:  1. *** : Continue {anticoagulants:31417}  and ***  for secondary stroke prevention. Maintain  strict control of hypertension with blood pressure goal below 130/90, diabetes with hemoglobin A1c goal below 6.5% and cholesterol with LDL cholesterol (bad cholesterol) goal below 70 mg/dL.  I also advised the patient to eat a healthy diet with plenty of whole grains, cereals, fruits and vegetables, exercise regularly with at least 30 minutes of continuous activity daily and maintain ideal body weight. 2. HTN: Advised to continue current treatment regimen.  Today's BP ***.  Advised to continue to monitor at home along with continued follow-up with PCP for management 3. HLD: Advised to continue current treatment regimen along with continued follow-up with PCP for future prescribing and monitoring of lipid panel 4. DMII: Advised to continue to monitor glucose levels at home along with continued follow-up with PCP for management and monitoring    Follow up in *** or call earlier if needed   Greater than 50% of time during this 45 minute visit was spent on counseling, explanation of diagnosis of ***, reviewing risk factor management of ***, planning of further management along with potential future management, and discussion with patient and family answering all questions.    Venancio Poisson, AGNP-BC  [facility] [address] [place], [place] [address]  Phone [phone] Fax [phone] Note: This document was prepared with digital dictation and possible smart phrase technology. Any transcriptional errors that result from this process are unintentional.

## 2019-04-18 NOTE — Telephone Encounter (Signed)
Patient was a no call/no show for their appointment today.   

## 2019-04-19 ENCOUNTER — Telehealth: Payer: Self-pay

## 2019-04-19 NOTE — Telephone Encounter (Signed)
Unable to get in contact with the patient to offer her a sooner appt. I left a voicemail asking her to return my call. Office number was provided.   When patients or family member calls back please offer them the 3:45 slot on Thursday 04-21-2019. It is already on hold for her.

## 2019-05-03 ENCOUNTER — Inpatient Hospital Stay: Payer: Medicare Other | Admitting: Adult Health

## 2019-05-30 ENCOUNTER — Ambulatory Visit (INDEPENDENT_AMBULATORY_CARE_PROVIDER_SITE_OTHER): Payer: Medicare Other | Admitting: Adult Health

## 2019-05-30 ENCOUNTER — Telehealth: Payer: Self-pay

## 2019-05-30 ENCOUNTER — Encounter: Payer: Self-pay | Admitting: Adult Health

## 2019-05-30 ENCOUNTER — Other Ambulatory Visit: Payer: Self-pay

## 2019-05-30 VITALS — BP 131/93 | HR 110 | Temp 97.7°F | Ht 62.0 in

## 2019-05-30 DIAGNOSIS — R4701 Aphasia: Secondary | ICD-10-CM | POA: Diagnosis not present

## 2019-05-30 DIAGNOSIS — I610 Nontraumatic intracerebral hemorrhage in hemisphere, subcortical: Secondary | ICD-10-CM

## 2019-05-30 DIAGNOSIS — E785 Hyperlipidemia, unspecified: Secondary | ICD-10-CM

## 2019-05-30 DIAGNOSIS — R269 Unspecified abnormalities of gait and mobility: Secondary | ICD-10-CM

## 2019-05-30 DIAGNOSIS — R4189 Other symptoms and signs involving cognitive functions and awareness: Secondary | ICD-10-CM

## 2019-05-30 DIAGNOSIS — I10 Essential (primary) hypertension: Secondary | ICD-10-CM

## 2019-05-30 NOTE — Progress Notes (Signed)
[facility] [address] [place]. [place] [address] [phone]       HOSPITAL FOLLOW UP NOTE  [name] Date of Birth:  December 07, 1931 Medical Record Number:  [id number]   Reason for Referral:  hospital stroke follow up    CHIEF COMPLAINT:  Chief Complaint  Patient presents with   Hospitalization Follow-up    Hannah(Granddaughter present). Rm 9. No new concerns at this time.     HPI: Michele Diaz being seen today for in office hospital follow-up regarding left thalamic ICH with IVH on Xarelto on 03/15/2019.  History obtained from patient, granddaughter and chart review. Reviewed all radiology images and labs personally.  Michele Diaz is a 85 y.o. female with history of AF on Xarelto, prior stroke 12/2012, temporal arteritis, breast cancer, GERD, fibromyalgia, diastolic dysfunction and hypertension presented to [facility] on 03/15/2019 with altered mental status and hypertensive emergency. She was soon transferred to [facility] for further evaluation and management.  Neurology consulted with CT showing left thalamic hemorrhage.  AC reversed with Kcentra and vitamin K with neurosurgery consult but no intervention required at this time.  Repeat CT head stable appearance of ICH and IVH.  CTA head/neck did not show evidence of AVM or aneurysm.  2D echo normal EF without cardiac source of embolus identified.  ICH etiology likely secondary to Xarelto related to cardiomyopathy and hypertensive source.  Recommended restarting Xarelto once hematoma resolves in approximately 3 to 4 weeks.  BP stabilized and recommended long-term BP goal normotensive range.  LDL 123 with prior intolerance to atorvastatin and rosuvastatin with elevated LFTs.  No evidence of DM with A1c 5.5.  Other extremities factors include advanced age, diastolic dysfunction on Lasix and prior history of stroke.  Other active problems including baseline cognitive deficit on Namenda, history of breast cancer, history of  temporal arteritis with daily headaches, herpes zoster, autoimmune hepatitis, hypothyroidism, GERD, hypokalemia and hyponatremia.  Residual deficits of cognitive impairment and expressive aphasia and discharged to SNF for ongoing therapy.  Michele Diaz is being seen today for hospital follow-up regarding recent stroke accompanied by her granddaughter.  Residual deficits of occasional speech difficulties and decreased cognition compared to baseline. Granddaughter does endorse decreased conversation and limited speech.  Questions possible depression versus secondary to cognitive worsening.  Currently on Lexapro 10 mg daily.  Patient declines depression and states "sometimes I have things to say, other times I just shut up". Currently at [facility] in [place], [place] ALF where she was residing previously.  She spent 2 weeks at the skilled facility at [facility] and then returned to ALF. Transfers independently without assistive device and denies any recent falls.  She is able to maintain majority of ADLs independently.  Continues on Namenda which has been ongoing.  Per review of facility MAR, Xarelto 15 mg daily was restarted without any bleeding or bruising.  Blood pressure today 131/93.  She continues to be managed by facility provider for all chronic conditions.  Denies new or worsening stroke/TIA symptoms.    ROS:   14 system review of systems performed and negative with exception of speech difficulty, memory loss, confusion and gait difficulty  PMH:  Past Medical History:  Diagnosis Date   Allergic rhinitis    Anxiety    Arthritis    "hips, hands, feet; pretty much feels like all over" (01/07/2013)   Asthma    Breast cancer ([place]) ~ 2008   "right" (01/07/2013)   Chronic lower back  pain    Daily headache    "related to temporal arteritis" (01/07/2013)   Depression    Diastolic dysfunction    Diverticulitis of colon    Fibromyalgia    GERD (gastroesophageal reflux  disease)    Giant cell arteritis ([place]) 2003   devashwar   Hepatitis, autoimmune ([place])    Herpes zoster 2011   Hyperlipidemia    statins increased LFTs   Hypertension    Hypothyroid    Persistent atrial fibrillation    a. s/p TEE/DCCV in 01/2016   Stroke ([place])    Temporal arteritis ([place])    Unspecified vitamin D deficiency    Weight loss 6/11    PSH:  Past Surgical History:  Procedure Laterality Date   ABDOMINAL HYSTERECTOMY  1988   APPENDECTOMY     BREAST BIOPSY Right    BREAST LUMPECTOMY Right    CARDIOVERSION N/A 02/06/2016   Procedure: CARDIOVERSION;  Surgeon: Skeet Latch, MD;  Location: [place];  Service: Cardiovascular;  Laterality: N/A;   CHOLECYSTECTOMY     DILATION AND CURETTAGE OF UTERUS     TEE WITHOUT CARDIOVERSION N/A 02/06/2016   Procedure: TRANSESOPHAGEAL ECHOCARDIOGRAM (TEE);  Surgeon: Skeet Latch, MD;  Location: [facility];  Service: Cardiovascular;  Laterality: N/A;   TONSILLECTOMY      Social History:  Social History   Socioeconomic History   Marital status: Widowed    Spouse name: Not on file   Number of children: 2   Years of education: Not on file   Highest education level: Not on file  Occupational History   Occupation: retired    Fish farm manager: RETIRED  Scientist, product/process development strain: Not on file   Food insecurity    Worry: Not on file    Inability: Not on file   Transportation needs    Medical: Not on file    Non-medical: Not on file  Tobacco Use   Smoking status: Never Smoker   Smokeless tobacco: Never Used  Substance and Sexual Activity   Alcohol use: No   Drug use: No   Sexual activity: Never  Lifestyle   Physical activity    Days per week: Not on file    Minutes per session: Not on file   Stress: Not on file  Relationships   Social connections    Talks on phone: Not on file    Gets together: Not on file    Attends religious service: Not on file    Active member of  club or organization: Not on file    Attends meetings of clubs or organizations: Not on file    Relationship status: Not on file   Intimate partner violence    Fear of current or ex partner: Not on file    Emotionally abused: Not on file    Physically abused: Not on file    Forced sexual activity: Not on file  Other Topics Concern   Not on file  Social History Narrative   Not on file    Family History:  Family History  Problem Relation Age of Onset   Diabetes Mother    Heart attack Mother        died of MI at age 80   Cancer Brother    Heart disease Brother    Cancer Sister    Heart disease Sister        MI at age 27   Alzheimer's disease Sister    Hypertension Other  Medications:   Current Outpatient Medications on File Prior to Visit  Medication Sig Dispense Refill   azaTHIOprine (IMURAN) 50 MG tablet TAKE 1 TABLET (50 MG TOTAL) BY MOUTH DAILY. 90 tablet 2   cholecalciferol (VITAMIN D) 1000 UNITS tablet Take 1 tablet (1,000 Units total) by mouth daily. 30 tablet 1   diltiazem (CARDIZEM CD) 120 MG 24 hr capsule Take 1 capsule (120 mg total) by mouth daily. 90 capsule 1   escitalopram (LEXAPRO) 10 MG tablet Take 1 tablet (10 mg total) by mouth daily. 90 tablet 1   folic acid (FOLVITE) 1 MG tablet Take 1 tablet (1 mg total) by mouth daily. 90 tablet 1   furosemide (LASIX) 20 MG tablet Take 1 tablet (20 mg total) by mouth daily. 30 tablet 1   levothyroxine (SYNTHROID, LEVOTHROID) 75 MCG tablet Take 1 tablet (75 mcg total) by mouth daily before breakfast. 90 tablet 1   memantine (NAMENDA) 10 MG tablet Take 1 tablet (10 mg total) by mouth daily. 90 tablet 1   metoprolol tartrate (LOPRESSOR) 50 MG tablet Take 1 tablet (50 mg total) by mouth 2 (two) times daily. (Patient taking differently: Take 50 mg by mouth daily. ) 60 tablet 1   pantoprazole (PROTONIX) 40 MG tablet Take 1 tablet (40 mg total) by mouth daily. 90 tablet 1   potassium chloride (K-DUR) 10  MEQ tablet Take 1 tablet (10 mEq total) by mouth daily. 30 tablet 1   Rivaroxaban (XARELTO) 15 MG TABS tablet Take 15 mg by mouth daily.     acetaminophen (TYLENOL) 325 MG tablet Take 650 mg by mouth every 6 (six) hours as needed for mild pain.     [DISCONTINUED] FLUoxetine (PROZAC) 20 MG tablet Take 1 tablet (20 mg total) by mouth daily. 90 tablet 3   No current facility-administered medications on file prior to visit.     Allergies:   Allergies  Allergen Reactions   Atorvastatin Other (See Comments)    Elevated liver enzymes   Celecoxib Shortness Of Breath, Itching and Rash    redness   Rosuvastatin Other (See Comments)    Elevated liver enzymes   Codeine Nausea Only   Procaine Other (See Comments)    Unknown   Penicillins Hives     Physical Exam  Vitals:   05/30/19 1423  BP: (!) 131/93  Pulse: (!) 110  Temp: 97.7 F (36.5 C)  TempSrc: Oral  Height: 5\' 2"  (1.575 m)   Body mass index is 25.48 kg/m. No exam data present  Depression screen [facility] 2/9 05/30/2019  Decreased Interest 0  Down, Depressed, Hopeless 0  PHQ - 2 Score 0     General: well developed, well nourished, pleasant elderly Caucasian female, seated, in no evident distress Head: head normocephalic and atraumatic.   Neck: supple with no carotid or supraclavicular bruits Cardiovascular: irregular rate and rhythm, no murmurs Musculoskeletal: no deformity Skin:  no rash/petichiae Vascular:  Normal pulses all extremities   Neurologic Exam Mental Status: Awake and fully alert.   Mild expressive aphasia.  Oriented to self.  Granddaughter provides history.  Mood and affect appropriate and cooperative with exam.  Cranial Nerves: Fundoscopic exam reveals sharp disc margins. Pupils equal, briskly reactive to light. Extraocular movements full without nystagmus. Visual fields full to confrontation. Hearing intact. Facial sensation intact. Face, tongue, palate moves normally and symmetrically.  Motor:  Normal bulk and tone. Normal strength in all tested extremity muscles. Sensory.: intact to touch , pinprick , position and vibratory  sensation.  Coordination: Rapid alternating movements generally slowed but symmetrical. Finger-to-nose and heel-to-shin performed accurately bilaterally. Gait and Station: Arises from chair without difficulty. Stance is normal. Gait demonstrates short quick steps with mild imbalance but ambulates without assistive device Reflexes: 1+ and symmetric. Toes downgoing.     NIHSS  2 Modified Rankin  3 CHA2DS2-VASc 6 HAS-BLED 3   Diagnostic Data (Labs, Imaging, Testing)  CT HEAD WO CONTRAST 03/16/2019 IMPRESSION: Left thalamic intracerebral hemorrhage with intraventricular extension. No hydrocephalus. Atrophy, chronic small vessel disease.   CT ANGIO HEAD W OR WO CONTRAST CT ANGIO NECK W OR WO CONTRAST 03/17/2019 IMPRESSION: 1. Stable left thalamus acute hemorrhage and left lateral ventricle intraventricular hemorrhage. No new acute intracranial abnormality on noncontrast CT of head. 2. Patent carotid and vertebral arteries. No dissection, aneurysm, or hemodynamically significant stenosis utilizing NASCET criteria. 3. Patent anterior and posterior intracranial circulation. No vascular malformation, large vessel occlusion, aneurysm, or significant stenosis.  ECHOCARDIOGRAM 03/16/2019 IMPRESSIONS  1. The left ventricle has normal systolic function, with an ejection fraction of 55-60%. The cavity size was normal. Left ventricular diastolic Doppler parameters are indeterminate. No evidence of left ventricular regional wall motion abnormalities.  2. The right ventricle has normal systolic function. The cavity was normal. There is no increase in right ventricular wall thickness.  3. Left atrial size was mildly dilated.  4. There is mild mitral annular calcification present. No evidence of mitral valve stenosis. Trivial mitral regurgitation.  5. The aortic  valve is tricuspid. No stenosis of the aortic valve.  6. The interatrial septum was not well visualized.  7. Normal IVC size. No complete TR doppler jet so unable to estimate PA systolic pressure.  8. The patient was in atrial fibrillation.  9. Technically difficult study with poor acoustic windows.    ASSESSMENT: Michele Diaz is a 85 y.o. year old female presented with altered mental status on 03/15/2019 with stroke work-up revealing left pelvic ICH with IVH on Xarelto status post reversal secondary to Xarelto related coagulopathy and hypertensive source. Vascular risk factors include HTN, HLD, AF on Xarelto, prior stroke and diastolic dysfunction.  Residual deficits of mild expressive aphasia and worsening cognition along with ongoing gait difficulty.    PLAN:  1. ICH: Continue Xarelto (rivaroxaban) daily  for secondary stroke prevention. Maintain strict control of hypertension with blood pressure goal below 130/90, diabetes with hemoglobin A1c goal below 6.5% and cholesterol with LDL cholesterol (bad cholesterol) goal below 70 mg/dL.  I also advised the patient to eat a healthy diet with plenty of whole grains, cereals, fruits and vegetables, exercise regularly with at least 30 minutes of continuous activity daily and maintain ideal body weight. 2. Residual deficits: Referral placed to home health PT and speech therapy for evaluation with potential treatment of ongoing deficits 3. Cognitive decline/depression: Patient declines depression type symptoms.  Recommend ongoing use of Lexapro and ongoing monitoring by PCP.  She will also continue Namenda and if any worsening of cognition, recommend initiating Aricept to slow memory progression 4. Atrial fibrillation: Continue Xarelto and ongoing follow-up with PCP for management/monitoring 5. HTN: Advised to continue current treatment regimen.  Today's BP stable.  Advised to continue to monitor at home along with continued follow-up with PCP for  management 6. HLD: Continue to monitor by PCP with potential need of initiating management in the future if needed with history of statin intolerance   Follow up in 3 months or call earlier if needed   Greater than 50% of  time during this 45 minute visit was spent on counseling, explanation of diagnosis of ICH, discussion regarding residual deficits and participation in therapy, reviewing risk factor management of atrial fibrillation on AC, HTN and HLD, planning of further management along with potential future management, and discussion with patient and family answering all questions.    Frann Rider, AGNP-BC  [facility] [address] [place], [place] [address]  Phone [phone] Fax [phone] Note: This document was prepared with digital dictation and possible smart phrase technology. Any transcriptional errors that result from this process are unintentional.

## 2019-05-30 NOTE — Telephone Encounter (Signed)
Office note has been faxed to [facility] @ [phone]. Confirmation fax has been received.

## 2019-05-30 NOTE — Patient Instructions (Addendum)
Continue Xarelto (rivaroxaban) daily for secondary stroke prevention  Continue to follow up with PCP regarding cholesterol, blood pressure, cognitive impairment and atrial fibrillation management  Recommend ongoing monitoring of possible depression with potential need of medication changes  Referral placed for evaluation by physical and speech therapy for ambulation and speech difficulty  Continue to monitor blood pressure at home  Maintain strict control of hypertension with blood pressure goal below 130/90, diabetes with hemoglobin A1c goal below 6.5% and cholesterol with LDL cholesterol (bad cholesterol) goal below 70 mg/dL. I also advised the patient to eat a healthy diet with plenty of whole grains, cereals, fruits and vegetables, exercise regularly and maintain ideal body weight.  Followup in the future with me in 3 months or call earlier if needed       Thank you for coming to see [place] at [facility]. I hope we have been able to provide you high quality care today.  You may receive a patient satisfaction survey over the next few weeks. We would appreciate your feedback and comments so that we may continue to improve ourselves and the health of our patients.

## 2019-05-31 ENCOUNTER — Telehealth: Payer: Self-pay | Admitting: Adult Health

## 2019-05-31 NOTE — Telephone Encounter (Signed)
Called and left message for family to call me back Called and left message asking for a call back of where they want home health referral sent .   Currently residing at [place], [place]

## 2019-06-01 NOTE — Progress Notes (Signed)
I agree with the above plan
